# Patient Record
Sex: Female | Born: 1955 | Race: Black or African American | Hispanic: No | Marital: Married | Smoking: Never smoker
Health system: Southern US, Community
[De-identification: ages and names within clinical notes are randomized; demographics above are authoritative.]

## PROBLEM LIST (undated history)

## (undated) DIAGNOSIS — G894 Chronic pain syndrome: Secondary | ICD-10-CM

## (undated) DIAGNOSIS — J302 Other seasonal allergic rhinitis: Secondary | ICD-10-CM

## (undated) DIAGNOSIS — F329 Major depressive disorder, single episode, unspecified: Secondary | ICD-10-CM

## (undated) DIAGNOSIS — E119 Type 2 diabetes mellitus without complications: Secondary | ICD-10-CM

## (undated) DIAGNOSIS — I4711 Inappropriate sinus tachycardia, so stated: Secondary | ICD-10-CM

## (undated) DIAGNOSIS — D649 Anemia, unspecified: Secondary | ICD-10-CM

## (undated) DIAGNOSIS — R000 Tachycardia, unspecified: Secondary | ICD-10-CM

## (undated) DIAGNOSIS — M19011 Primary osteoarthritis, right shoulder: Secondary | ICD-10-CM

## (undated) DIAGNOSIS — M170 Bilateral primary osteoarthritis of knee: Secondary | ICD-10-CM

## (undated) DIAGNOSIS — K2970 Gastritis, unspecified, without bleeding: Secondary | ICD-10-CM

## (undated) DIAGNOSIS — K219 Gastro-esophageal reflux disease without esophagitis: Secondary | ICD-10-CM

## (undated) DIAGNOSIS — F419 Anxiety disorder, unspecified: Secondary | ICD-10-CM

## (undated) DIAGNOSIS — M19012 Primary osteoarthritis, left shoulder: Secondary | ICD-10-CM

## (undated) DIAGNOSIS — G8929 Other chronic pain: Secondary | ICD-10-CM

## (undated) DIAGNOSIS — M797 Fibromyalgia: Secondary | ICD-10-CM

## (undated) DIAGNOSIS — F32A Depression, unspecified: Secondary | ICD-10-CM

## (undated) DIAGNOSIS — Z531 Procedure and treatment not carried out because of patient's decision for reasons of belief and group pressure: Secondary | ICD-10-CM

## (undated) DIAGNOSIS — G43909 Migraine, unspecified, not intractable, without status migrainosus: Secondary | ICD-10-CM

## (undated) DIAGNOSIS — M549 Dorsalgia, unspecified: Secondary | ICD-10-CM

## (undated) DIAGNOSIS — IMO0001 Reserved for inherently not codable concepts without codable children: Secondary | ICD-10-CM

## (undated) DIAGNOSIS — S92919A Unspecified fracture of unspecified toe(s), initial encounter for closed fracture: Secondary | ICD-10-CM

## (undated) DIAGNOSIS — I10 Essential (primary) hypertension: Secondary | ICD-10-CM

## (undated) DIAGNOSIS — M8580 Other specified disorders of bone density and structure, unspecified site: Secondary | ICD-10-CM

## (undated) HISTORY — DX: Primary osteoarthritis, right shoulder: M19.011

## (undated) HISTORY — DX: Inappropriate sinus tachycardia, so stated: I47.11

## (undated) HISTORY — DX: Chronic pain syndrome: G89.4

## (undated) HISTORY — DX: Major depressive disorder, single episode, unspecified: F32.9

## (undated) HISTORY — DX: Essential (primary) hypertension: I10

## (undated) HISTORY — DX: Other specified disorders of bone density and structure, unspecified site: M85.80

## (undated) HISTORY — DX: Depression, unspecified: F32.A

## (undated) HISTORY — DX: Gastro-esophageal reflux disease without esophagitis: K21.9

## (undated) HISTORY — DX: Fibromyalgia: M79.7

## (undated) HISTORY — PX: TUBAL LIGATION: SHX77

## (undated) HISTORY — DX: Other seasonal allergic rhinitis: J30.2

## (undated) HISTORY — DX: Anemia, unspecified: D64.9

## (undated) HISTORY — DX: Unspecified fracture of unspecified toe(s), initial encounter for closed fracture: S92.919A

## (undated) HISTORY — DX: Primary osteoarthritis, right shoulder: M19.012

## (undated) HISTORY — PX: CYSTOSCOPY: SUR368

## (undated) HISTORY — PX: CHOLECYSTECTOMY: SHX55

## (undated) HISTORY — DX: Tachycardia, unspecified: R00.0

## (undated) HISTORY — DX: Anxiety disorder, unspecified: F41.9

## (undated) HISTORY — DX: Gastritis, unspecified, without bleeding: K29.70

## (undated) HISTORY — DX: Bilateral primary osteoarthritis of knee: M17.0

---

## 1997-10-08 ENCOUNTER — Ambulatory Visit (HOSPITAL_COMMUNITY): Admission: RE | Admit: 1997-10-08 | Discharge: 1997-10-08 | Payer: Self-pay | Admitting: *Deleted

## 1997-10-30 ENCOUNTER — Emergency Department (HOSPITAL_COMMUNITY): Admission: EM | Admit: 1997-10-30 | Discharge: 1997-10-30 | Payer: Self-pay | Admitting: Emergency Medicine

## 1998-02-12 ENCOUNTER — Ambulatory Visit (HOSPITAL_COMMUNITY): Admission: RE | Admit: 1998-02-12 | Discharge: 1998-02-12 | Payer: Self-pay | Admitting: *Deleted

## 1998-08-16 ENCOUNTER — Other Ambulatory Visit: Admission: RE | Admit: 1998-08-16 | Discharge: 1998-08-16 | Payer: Self-pay | Admitting: Obstetrics and Gynecology

## 1999-05-05 ENCOUNTER — Ambulatory Visit (HOSPITAL_COMMUNITY): Admission: RE | Admit: 1999-05-05 | Discharge: 1999-05-05 | Payer: Self-pay | Admitting: Gastroenterology

## 1999-05-18 ENCOUNTER — Ambulatory Visit (HOSPITAL_COMMUNITY): Admission: RE | Admit: 1999-05-18 | Discharge: 1999-05-18 | Payer: Self-pay | Admitting: Obstetrics and Gynecology

## 1999-05-18 ENCOUNTER — Encounter (INDEPENDENT_AMBULATORY_CARE_PROVIDER_SITE_OTHER): Payer: Self-pay | Admitting: Specialist

## 1999-09-25 ENCOUNTER — Emergency Department (HOSPITAL_COMMUNITY): Admission: EM | Admit: 1999-09-25 | Discharge: 1999-09-25 | Payer: Self-pay | Admitting: Emergency Medicine

## 1999-12-22 ENCOUNTER — Emergency Department (HOSPITAL_COMMUNITY): Admission: EM | Admit: 1999-12-22 | Discharge: 1999-12-22 | Payer: Self-pay | Admitting: Emergency Medicine

## 2000-02-23 ENCOUNTER — Emergency Department (HOSPITAL_COMMUNITY): Admission: EM | Admit: 2000-02-23 | Discharge: 2000-02-23 | Payer: Self-pay | Admitting: Emergency Medicine

## 2000-04-07 ENCOUNTER — Emergency Department (HOSPITAL_COMMUNITY): Admission: EM | Admit: 2000-04-07 | Discharge: 2000-04-07 | Payer: Self-pay | Admitting: Emergency Medicine

## 2000-04-21 ENCOUNTER — Emergency Department (HOSPITAL_COMMUNITY): Admission: EM | Admit: 2000-04-21 | Discharge: 2000-04-21 | Payer: Self-pay | Admitting: Emergency Medicine

## 2000-04-26 ENCOUNTER — Emergency Department (HOSPITAL_COMMUNITY): Admission: EM | Admit: 2000-04-26 | Discharge: 2000-04-26 | Payer: Self-pay | Admitting: Emergency Medicine

## 2000-09-10 ENCOUNTER — Emergency Department (HOSPITAL_COMMUNITY): Admission: EM | Admit: 2000-09-10 | Discharge: 2000-09-10 | Payer: Self-pay | Admitting: Emergency Medicine

## 2000-09-10 ENCOUNTER — Encounter: Payer: Self-pay | Admitting: Emergency Medicine

## 2000-09-13 ENCOUNTER — Encounter: Admission: RE | Admit: 2000-09-13 | Discharge: 2000-09-13 | Payer: Self-pay | Admitting: Internal Medicine

## 2000-09-25 ENCOUNTER — Encounter: Admission: RE | Admit: 2000-09-25 | Discharge: 2000-09-25 | Payer: Self-pay | Admitting: Internal Medicine

## 2000-10-10 ENCOUNTER — Other Ambulatory Visit: Admission: RE | Admit: 2000-10-10 | Discharge: 2000-10-10 | Payer: Self-pay | Admitting: Obstetrics and Gynecology

## 2000-11-06 ENCOUNTER — Encounter: Admission: RE | Admit: 2000-11-06 | Discharge: 2000-11-06 | Payer: Self-pay | Admitting: Internal Medicine

## 2000-11-29 ENCOUNTER — Encounter: Admission: RE | Admit: 2000-11-29 | Discharge: 2000-11-29 | Payer: Self-pay | Admitting: Internal Medicine

## 2000-12-05 ENCOUNTER — Encounter: Payer: Self-pay | Admitting: Internal Medicine

## 2000-12-05 ENCOUNTER — Encounter: Admission: RE | Admit: 2000-12-05 | Discharge: 2000-12-05 | Payer: Self-pay | Admitting: Internal Medicine

## 2000-12-05 ENCOUNTER — Ambulatory Visit (HOSPITAL_COMMUNITY): Admission: RE | Admit: 2000-12-05 | Discharge: 2000-12-05 | Payer: Self-pay | Admitting: Internal Medicine

## 2000-12-07 ENCOUNTER — Encounter: Admission: RE | Admit: 2000-12-07 | Discharge: 2000-12-07 | Payer: Self-pay | Admitting: Internal Medicine

## 2000-12-12 ENCOUNTER — Encounter: Payer: Self-pay | Admitting: Emergency Medicine

## 2000-12-12 ENCOUNTER — Emergency Department (HOSPITAL_COMMUNITY): Admission: EM | Admit: 2000-12-12 | Discharge: 2000-12-12 | Payer: Self-pay | Admitting: Emergency Medicine

## 2000-12-14 ENCOUNTER — Encounter: Admission: RE | Admit: 2000-12-14 | Discharge: 2000-12-14 | Payer: Self-pay | Admitting: Internal Medicine

## 2001-02-16 ENCOUNTER — Encounter: Admission: RE | Admit: 2001-02-16 | Discharge: 2001-02-16 | Payer: Self-pay | Admitting: Internal Medicine

## 2001-03-21 ENCOUNTER — Encounter: Admission: RE | Admit: 2001-03-21 | Discharge: 2001-03-21 | Payer: Self-pay | Admitting: Internal Medicine

## 2001-03-24 ENCOUNTER — Emergency Department (HOSPITAL_COMMUNITY): Admission: EM | Admit: 2001-03-24 | Discharge: 2001-03-24 | Payer: Self-pay | Admitting: Emergency Medicine

## 2001-05-09 HISTORY — PX: VAGINAL HYSTERECTOMY: SUR661

## 2001-06-04 ENCOUNTER — Other Ambulatory Visit: Admission: RE | Admit: 2001-06-04 | Discharge: 2001-06-04 | Payer: Self-pay | Admitting: Obstetrics and Gynecology

## 2001-07-06 ENCOUNTER — Ambulatory Visit (HOSPITAL_COMMUNITY)
Admission: RE | Admit: 2001-07-06 | Discharge: 2001-07-06 | Payer: Self-pay | Admitting: Physical Medicine & Rehabilitation

## 2001-07-06 ENCOUNTER — Encounter: Payer: Self-pay | Admitting: Physical Medicine & Rehabilitation

## 2001-09-11 ENCOUNTER — Encounter (INDEPENDENT_AMBULATORY_CARE_PROVIDER_SITE_OTHER): Payer: Self-pay | Admitting: Specialist

## 2001-09-11 ENCOUNTER — Observation Stay (HOSPITAL_COMMUNITY): Admission: RE | Admit: 2001-09-11 | Discharge: 2001-09-12 | Payer: Self-pay | Admitting: Obstetrics and Gynecology

## 2002-02-18 ENCOUNTER — Encounter: Payer: Self-pay | Admitting: Urology

## 2002-02-18 ENCOUNTER — Encounter: Admission: RE | Admit: 2002-02-18 | Discharge: 2002-02-18 | Payer: Self-pay | Admitting: Urology

## 2002-02-22 ENCOUNTER — Ambulatory Visit (HOSPITAL_BASED_OUTPATIENT_CLINIC_OR_DEPARTMENT_OTHER): Admission: RE | Admit: 2002-02-22 | Discharge: 2002-02-22 | Payer: Self-pay | Admitting: Urology

## 2002-03-12 ENCOUNTER — Other Ambulatory Visit (HOSPITAL_COMMUNITY): Admission: RE | Admit: 2002-03-12 | Discharge: 2002-03-28 | Payer: Self-pay | Admitting: Urology

## 2002-03-20 ENCOUNTER — Encounter: Admission: RE | Admit: 2002-03-20 | Discharge: 2002-03-20 | Payer: Self-pay | Admitting: Internal Medicine

## 2002-05-21 ENCOUNTER — Ambulatory Visit (HOSPITAL_COMMUNITY)
Admission: RE | Admit: 2002-05-21 | Discharge: 2002-05-21 | Payer: Self-pay | Admitting: Physical Medicine & Rehabilitation

## 2002-05-29 ENCOUNTER — Encounter: Payer: Self-pay | Admitting: Physical Medicine & Rehabilitation

## 2002-05-29 ENCOUNTER — Ambulatory Visit (HOSPITAL_COMMUNITY)
Admission: RE | Admit: 2002-05-29 | Discharge: 2002-05-29 | Payer: Self-pay | Admitting: Physical Medicine & Rehabilitation

## 2002-06-10 ENCOUNTER — Encounter: Admission: RE | Admit: 2002-06-10 | Discharge: 2002-06-10 | Payer: Self-pay | Admitting: Internal Medicine

## 2002-06-17 ENCOUNTER — Encounter: Admission: RE | Admit: 2002-06-17 | Discharge: 2002-09-15 | Payer: Self-pay | Admitting: Internal Medicine

## 2002-07-12 ENCOUNTER — Encounter: Payer: Self-pay | Admitting: Obstetrics and Gynecology

## 2002-07-12 ENCOUNTER — Ambulatory Visit (HOSPITAL_COMMUNITY): Admission: RE | Admit: 2002-07-12 | Discharge: 2002-07-12 | Payer: Self-pay | Admitting: Obstetrics and Gynecology

## 2002-07-18 ENCOUNTER — Ambulatory Visit (HOSPITAL_BASED_OUTPATIENT_CLINIC_OR_DEPARTMENT_OTHER): Admission: RE | Admit: 2002-07-18 | Discharge: 2002-07-18 | Payer: Self-pay | Admitting: Urology

## 2002-08-27 ENCOUNTER — Encounter
Admission: RE | Admit: 2002-08-27 | Discharge: 2002-11-25 | Payer: Self-pay | Admitting: Physical Medicine & Rehabilitation

## 2003-01-23 ENCOUNTER — Encounter
Admission: RE | Admit: 2003-01-23 | Discharge: 2003-04-23 | Payer: Self-pay | Admitting: Physical Medicine & Rehabilitation

## 2003-04-29 ENCOUNTER — Encounter
Admission: RE | Admit: 2003-04-29 | Discharge: 2003-07-28 | Payer: Self-pay | Admitting: Physical Medicine & Rehabilitation

## 2003-05-08 ENCOUNTER — Encounter: Admission: RE | Admit: 2003-05-08 | Discharge: 2003-05-08 | Payer: Self-pay | Admitting: Internal Medicine

## 2003-05-08 ENCOUNTER — Ambulatory Visit (HOSPITAL_COMMUNITY): Admission: RE | Admit: 2003-05-08 | Discharge: 2003-05-08 | Payer: Self-pay | Admitting: Internal Medicine

## 2003-05-29 ENCOUNTER — Encounter: Admission: RE | Admit: 2003-05-29 | Discharge: 2003-05-29 | Payer: Self-pay | Admitting: Internal Medicine

## 2003-08-14 ENCOUNTER — Encounter
Admission: RE | Admit: 2003-08-14 | Discharge: 2003-11-12 | Payer: Self-pay | Admitting: Physical Medicine & Rehabilitation

## 2003-10-02 ENCOUNTER — Emergency Department (HOSPITAL_COMMUNITY): Admission: EM | Admit: 2003-10-02 | Discharge: 2003-10-02 | Payer: Self-pay | Admitting: *Deleted

## 2003-10-16 ENCOUNTER — Encounter: Admission: RE | Admit: 2003-10-16 | Discharge: 2003-10-16 | Payer: Self-pay | Admitting: Internal Medicine

## 2004-02-04 ENCOUNTER — Encounter
Admission: RE | Admit: 2004-02-04 | Discharge: 2004-05-04 | Payer: Self-pay | Admitting: Physical Medicine & Rehabilitation

## 2004-02-06 ENCOUNTER — Ambulatory Visit: Payer: Self-pay | Admitting: Physical Medicine & Rehabilitation

## 2004-02-12 ENCOUNTER — Ambulatory Visit: Payer: Self-pay | Admitting: Internal Medicine

## 2004-03-04 ENCOUNTER — Ambulatory Visit: Payer: Self-pay | Admitting: Internal Medicine

## 2004-03-04 ENCOUNTER — Ambulatory Visit (HOSPITAL_COMMUNITY): Admission: RE | Admit: 2004-03-04 | Discharge: 2004-03-04 | Payer: Self-pay | Admitting: Internal Medicine

## 2004-03-10 ENCOUNTER — Ambulatory Visit (HOSPITAL_BASED_OUTPATIENT_CLINIC_OR_DEPARTMENT_OTHER)
Admission: RE | Admit: 2004-03-10 | Discharge: 2004-03-10 | Payer: Self-pay | Admitting: Physical Medicine & Rehabilitation

## 2004-03-12 ENCOUNTER — Emergency Department (HOSPITAL_COMMUNITY): Admission: EM | Admit: 2004-03-12 | Discharge: 2004-03-13 | Payer: Self-pay | Admitting: Emergency Medicine

## 2004-03-15 ENCOUNTER — Ambulatory Visit: Payer: Self-pay | Admitting: Internal Medicine

## 2004-04-23 ENCOUNTER — Emergency Department (HOSPITAL_COMMUNITY): Admission: EM | Admit: 2004-04-23 | Discharge: 2004-04-23 | Payer: Self-pay | Admitting: Emergency Medicine

## 2004-04-27 ENCOUNTER — Ambulatory Visit: Payer: Self-pay | Admitting: Internal Medicine

## 2004-05-13 ENCOUNTER — Encounter
Admission: RE | Admit: 2004-05-13 | Discharge: 2004-08-11 | Payer: Self-pay | Admitting: Physical Medicine & Rehabilitation

## 2004-05-17 ENCOUNTER — Ambulatory Visit: Payer: Self-pay | Admitting: Physical Medicine & Rehabilitation

## 2004-07-07 ENCOUNTER — Emergency Department (HOSPITAL_COMMUNITY): Admission: EM | Admit: 2004-07-07 | Discharge: 2004-07-07 | Payer: Self-pay | Admitting: Family Medicine

## 2004-07-08 ENCOUNTER — Emergency Department (HOSPITAL_COMMUNITY): Admission: EM | Admit: 2004-07-08 | Discharge: 2004-07-08 | Payer: Self-pay | Admitting: Family Medicine

## 2004-09-08 ENCOUNTER — Encounter
Admission: RE | Admit: 2004-09-08 | Discharge: 2004-12-07 | Payer: Self-pay | Admitting: Physical Medicine & Rehabilitation

## 2004-09-13 ENCOUNTER — Ambulatory Visit (HOSPITAL_COMMUNITY)
Admission: RE | Admit: 2004-09-13 | Discharge: 2004-09-13 | Payer: Self-pay | Admitting: Physical Medicine & Rehabilitation

## 2004-10-12 ENCOUNTER — Ambulatory Visit: Payer: Self-pay | Admitting: Physical Medicine & Rehabilitation

## 2004-12-06 ENCOUNTER — Ambulatory Visit: Payer: Self-pay | Admitting: Physical Medicine & Rehabilitation

## 2004-12-07 ENCOUNTER — Encounter
Admission: RE | Admit: 2004-12-07 | Discharge: 2005-03-07 | Payer: Self-pay | Admitting: Physical Medicine & Rehabilitation

## 2005-04-18 ENCOUNTER — Encounter
Admission: RE | Admit: 2005-04-18 | Discharge: 2005-07-17 | Payer: Self-pay | Admitting: Physical Medicine & Rehabilitation

## 2005-04-18 ENCOUNTER — Ambulatory Visit: Payer: Self-pay | Admitting: Physical Medicine & Rehabilitation

## 2005-05-13 ENCOUNTER — Ambulatory Visit: Payer: Self-pay | Admitting: Hospitalist

## 2005-05-17 ENCOUNTER — Ambulatory Visit (HOSPITAL_COMMUNITY): Admission: RE | Admit: 2005-05-17 | Discharge: 2005-05-17 | Payer: Self-pay | Admitting: Internal Medicine

## 2005-06-10 ENCOUNTER — Ambulatory Visit: Payer: Self-pay | Admitting: Internal Medicine

## 2005-06-13 ENCOUNTER — Ambulatory Visit (HOSPITAL_COMMUNITY): Admission: RE | Admit: 2005-06-13 | Discharge: 2005-06-13 | Payer: Self-pay | Admitting: *Deleted

## 2005-06-14 ENCOUNTER — Ambulatory Visit: Payer: Self-pay | Admitting: Internal Medicine

## 2005-06-20 ENCOUNTER — Encounter (INDEPENDENT_AMBULATORY_CARE_PROVIDER_SITE_OTHER): Payer: Self-pay | Admitting: *Deleted

## 2005-06-20 ENCOUNTER — Encounter (INDEPENDENT_AMBULATORY_CARE_PROVIDER_SITE_OTHER): Payer: Self-pay | Admitting: Interventional Radiology

## 2005-06-20 ENCOUNTER — Ambulatory Visit (HOSPITAL_COMMUNITY): Admission: RE | Admit: 2005-06-20 | Discharge: 2005-06-20 | Payer: Self-pay | Admitting: *Deleted

## 2005-06-24 ENCOUNTER — Ambulatory Visit: Payer: Self-pay | Admitting: Internal Medicine

## 2005-06-30 ENCOUNTER — Ambulatory Visit (HOSPITAL_COMMUNITY): Admission: RE | Admit: 2005-06-30 | Discharge: 2005-06-30 | Payer: Self-pay | Admitting: Internal Medicine

## 2005-06-30 ENCOUNTER — Ambulatory Visit: Payer: Self-pay | Admitting: Internal Medicine

## 2005-07-01 ENCOUNTER — Ambulatory Visit: Payer: Self-pay | Admitting: Internal Medicine

## 2005-07-05 ENCOUNTER — Encounter: Admission: RE | Admit: 2005-07-05 | Discharge: 2005-07-05 | Payer: Self-pay | Admitting: Internal Medicine

## 2005-07-12 ENCOUNTER — Ambulatory Visit: Payer: Self-pay | Admitting: Sports Medicine

## 2005-07-15 ENCOUNTER — Encounter
Admission: RE | Admit: 2005-07-15 | Discharge: 2005-10-13 | Payer: Self-pay | Admitting: Physical Medicine & Rehabilitation

## 2005-07-18 ENCOUNTER — Ambulatory Visit: Payer: Self-pay | Admitting: Physical Medicine & Rehabilitation

## 2005-07-28 ENCOUNTER — Encounter
Admission: RE | Admit: 2005-07-28 | Discharge: 2005-10-26 | Payer: Self-pay | Admitting: Physical Medicine & Rehabilitation

## 2005-08-09 ENCOUNTER — Ambulatory Visit: Payer: Self-pay | Admitting: Sports Medicine

## 2005-08-15 ENCOUNTER — Emergency Department (HOSPITAL_COMMUNITY): Admission: EM | Admit: 2005-08-15 | Discharge: 2005-08-15 | Payer: Self-pay | Admitting: Family Medicine

## 2005-09-28 ENCOUNTER — Ambulatory Visit: Payer: Self-pay | Admitting: Physical Medicine & Rehabilitation

## 2005-10-20 ENCOUNTER — Ambulatory Visit: Payer: Self-pay | Admitting: Psychology

## 2005-10-25 ENCOUNTER — Encounter
Admission: RE | Admit: 2005-10-25 | Discharge: 2006-01-23 | Payer: Self-pay | Admitting: Physical Medicine & Rehabilitation

## 2005-11-17 ENCOUNTER — Ambulatory Visit: Payer: Self-pay | Admitting: Physical Medicine & Rehabilitation

## 2005-11-19 ENCOUNTER — Emergency Department (HOSPITAL_COMMUNITY): Admission: EM | Admit: 2005-11-19 | Discharge: 2005-11-19 | Payer: Self-pay | Admitting: Emergency Medicine

## 2005-11-20 ENCOUNTER — Emergency Department (HOSPITAL_COMMUNITY): Admission: EM | Admit: 2005-11-20 | Discharge: 2005-11-20 | Payer: Self-pay | Admitting: Emergency Medicine

## 2005-11-24 ENCOUNTER — Ambulatory Visit: Payer: Self-pay | Admitting: Internal Medicine

## 2005-12-05 ENCOUNTER — Encounter: Admission: RE | Admit: 2005-12-05 | Discharge: 2005-12-05 | Payer: Self-pay | Admitting: Psychology

## 2005-12-22 ENCOUNTER — Ambulatory Visit: Payer: Self-pay | Admitting: Physical Medicine & Rehabilitation

## 2006-01-24 ENCOUNTER — Encounter: Admission: RE | Admit: 2006-01-24 | Discharge: 2006-04-24 | Payer: Self-pay | Admitting: Psychology

## 2006-02-09 ENCOUNTER — Encounter
Admission: RE | Admit: 2006-02-09 | Discharge: 2006-05-10 | Payer: Self-pay | Admitting: Physical Medicine & Rehabilitation

## 2006-02-09 ENCOUNTER — Ambulatory Visit: Payer: Self-pay | Admitting: Physical Medicine & Rehabilitation

## 2006-03-01 ENCOUNTER — Encounter: Payer: Self-pay | Admitting: Internal Medicine

## 2006-03-01 DIAGNOSIS — F410 Panic disorder [episodic paroxysmal anxiety] without agoraphobia: Secondary | ICD-10-CM | POA: Insufficient documentation

## 2006-03-01 DIAGNOSIS — E669 Obesity, unspecified: Secondary | ICD-10-CM | POA: Insufficient documentation

## 2006-03-01 DIAGNOSIS — I10 Essential (primary) hypertension: Secondary | ICD-10-CM | POA: Insufficient documentation

## 2006-03-22 ENCOUNTER — Ambulatory Visit: Payer: Self-pay | Admitting: Hospitalist

## 2006-03-22 ENCOUNTER — Ambulatory Visit (HOSPITAL_COMMUNITY): Admission: RE | Admit: 2006-03-22 | Discharge: 2006-03-22 | Payer: Self-pay | Admitting: Hospitalist

## 2006-03-29 ENCOUNTER — Ambulatory Visit: Payer: Self-pay | Admitting: Hospitalist

## 2006-03-29 ENCOUNTER — Encounter: Payer: Self-pay | Admitting: Internal Medicine

## 2006-04-06 ENCOUNTER — Ambulatory Visit: Payer: Self-pay | Admitting: Physical Medicine & Rehabilitation

## 2006-05-22 ENCOUNTER — Encounter
Admission: RE | Admit: 2006-05-22 | Discharge: 2006-08-20 | Payer: Self-pay | Admitting: Physical Medicine & Rehabilitation

## 2006-05-29 ENCOUNTER — Emergency Department (HOSPITAL_COMMUNITY): Admission: EM | Admit: 2006-05-29 | Discharge: 2006-05-29 | Payer: Self-pay | Admitting: Emergency Medicine

## 2006-05-30 ENCOUNTER — Ambulatory Visit: Payer: Self-pay | Admitting: Physical Medicine & Rehabilitation

## 2006-07-21 ENCOUNTER — Emergency Department (HOSPITAL_COMMUNITY): Admission: EM | Admit: 2006-07-21 | Discharge: 2006-07-22 | Payer: Self-pay | Admitting: Emergency Medicine

## 2006-07-25 ENCOUNTER — Ambulatory Visit: Payer: Self-pay | Admitting: Physical Medicine & Rehabilitation

## 2006-08-24 ENCOUNTER — Encounter: Admission: RE | Admit: 2006-08-24 | Discharge: 2006-08-24 | Payer: Self-pay | Admitting: Psychology

## 2006-09-20 ENCOUNTER — Ambulatory Visit: Payer: Self-pay | Admitting: Physical Medicine & Rehabilitation

## 2006-09-20 ENCOUNTER — Encounter
Admission: RE | Admit: 2006-09-20 | Discharge: 2006-12-19 | Payer: Self-pay | Admitting: Physical Medicine & Rehabilitation

## 2006-09-28 IMAGING — MG MM MAMMO LIMITED*L*
3 series · 3 of 3 positions shown · non-contrast
Comparison: none

DIGITAL LIMITED LEFT DIAGNOSTIC MAMMOGRAM:
CLINICAL DATA: The patient returns for evaluation of a possible area of distortion in the left 
breast noted on recent screening study of 05-17-05.

Spot compression views in the CC and MLO projection demonstrate no persistent mass or distortion.  
The initial appearance is felt to be due to overlapping glandular tissue.

[L CC (1 of 2)]
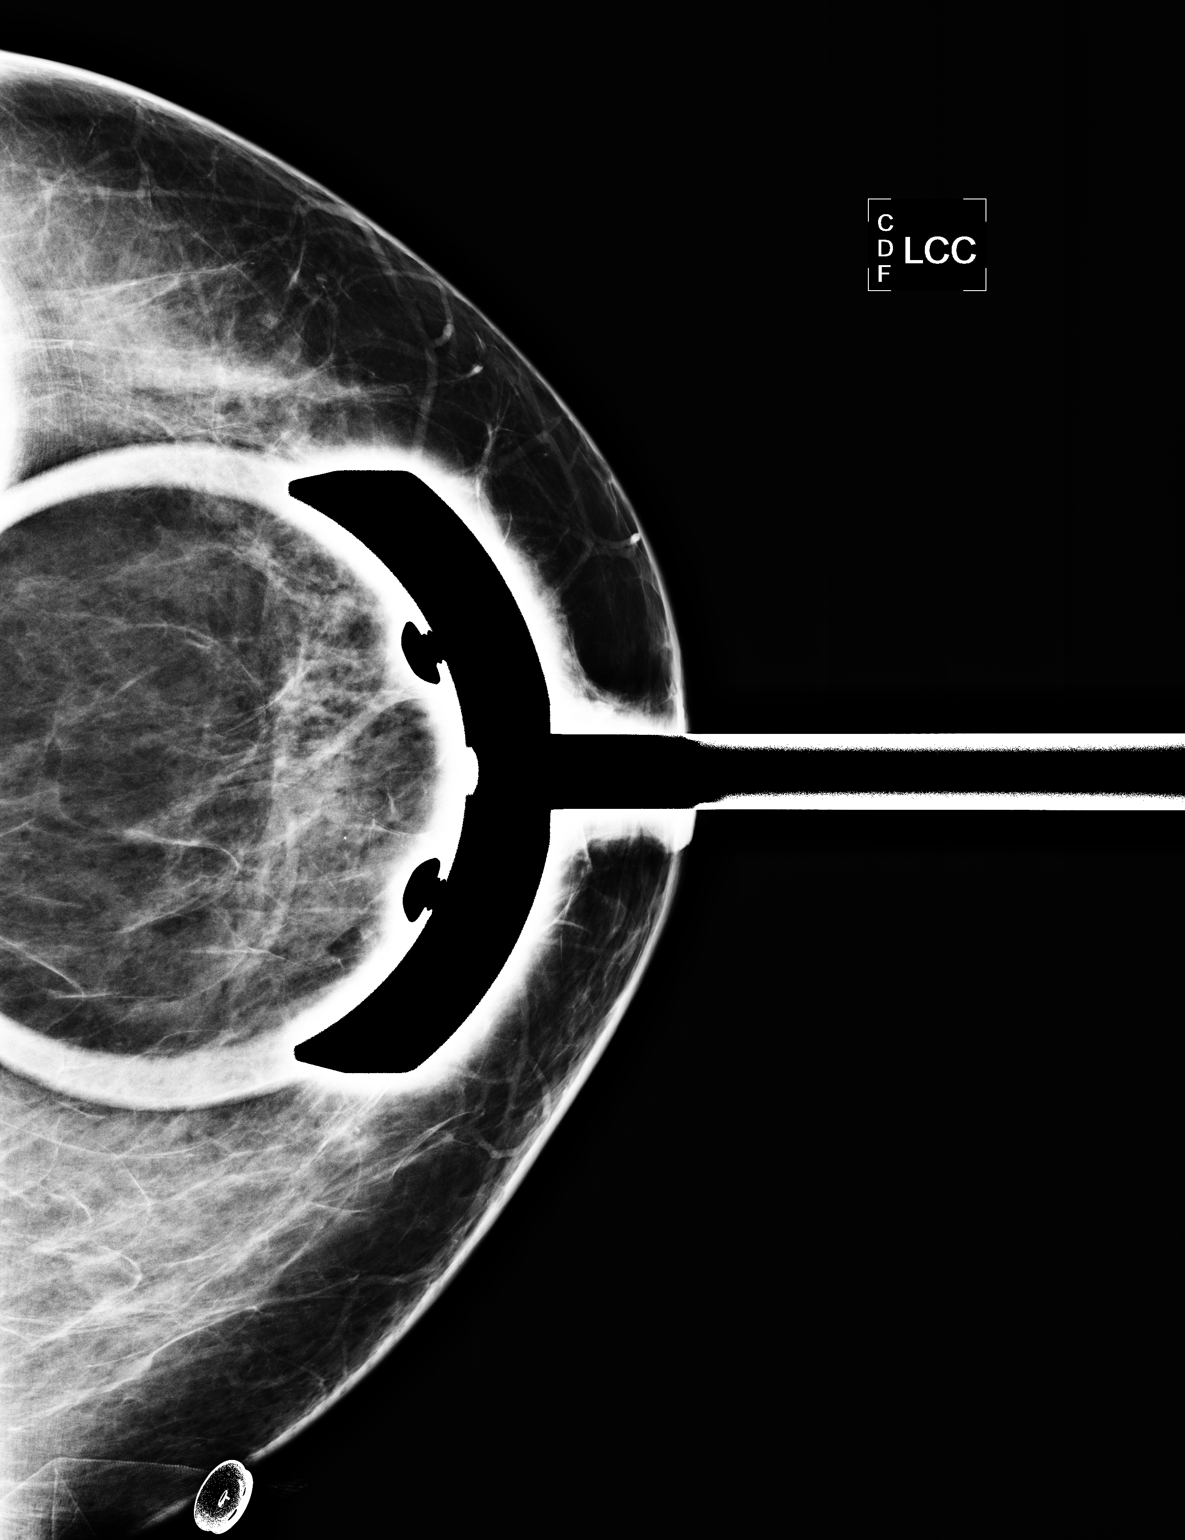

[L MLO]
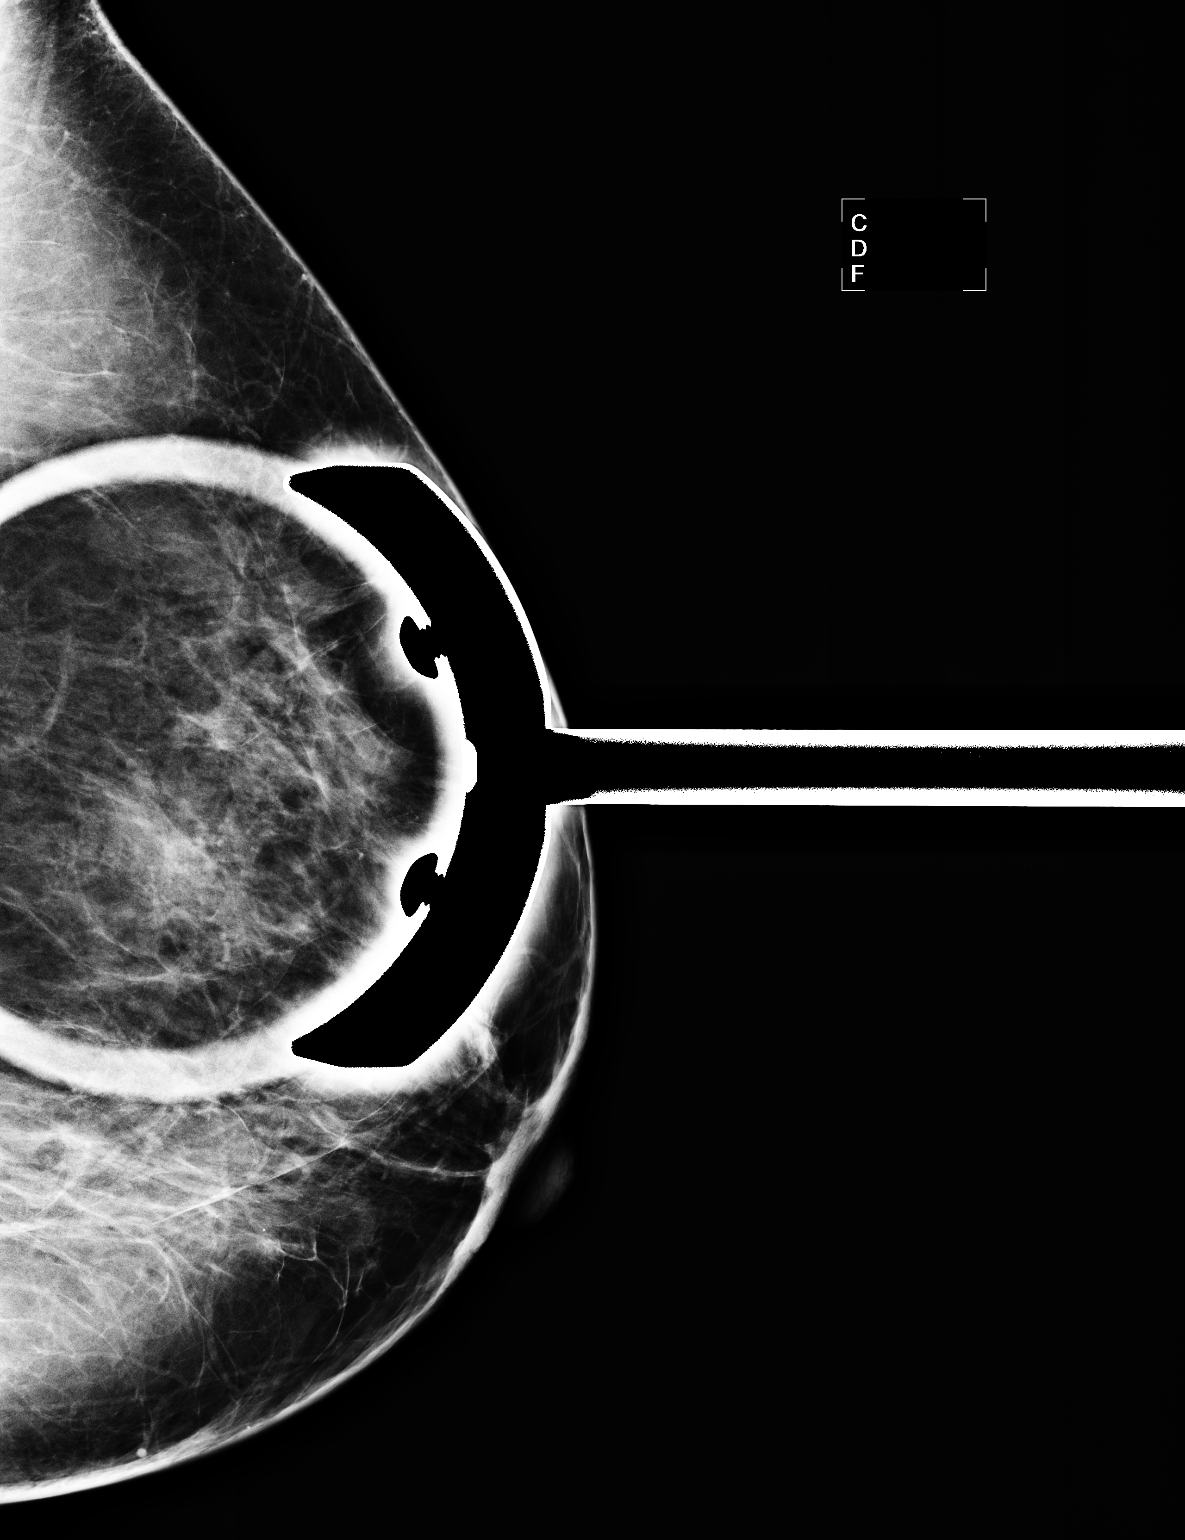

[L CC (2 of 2)]
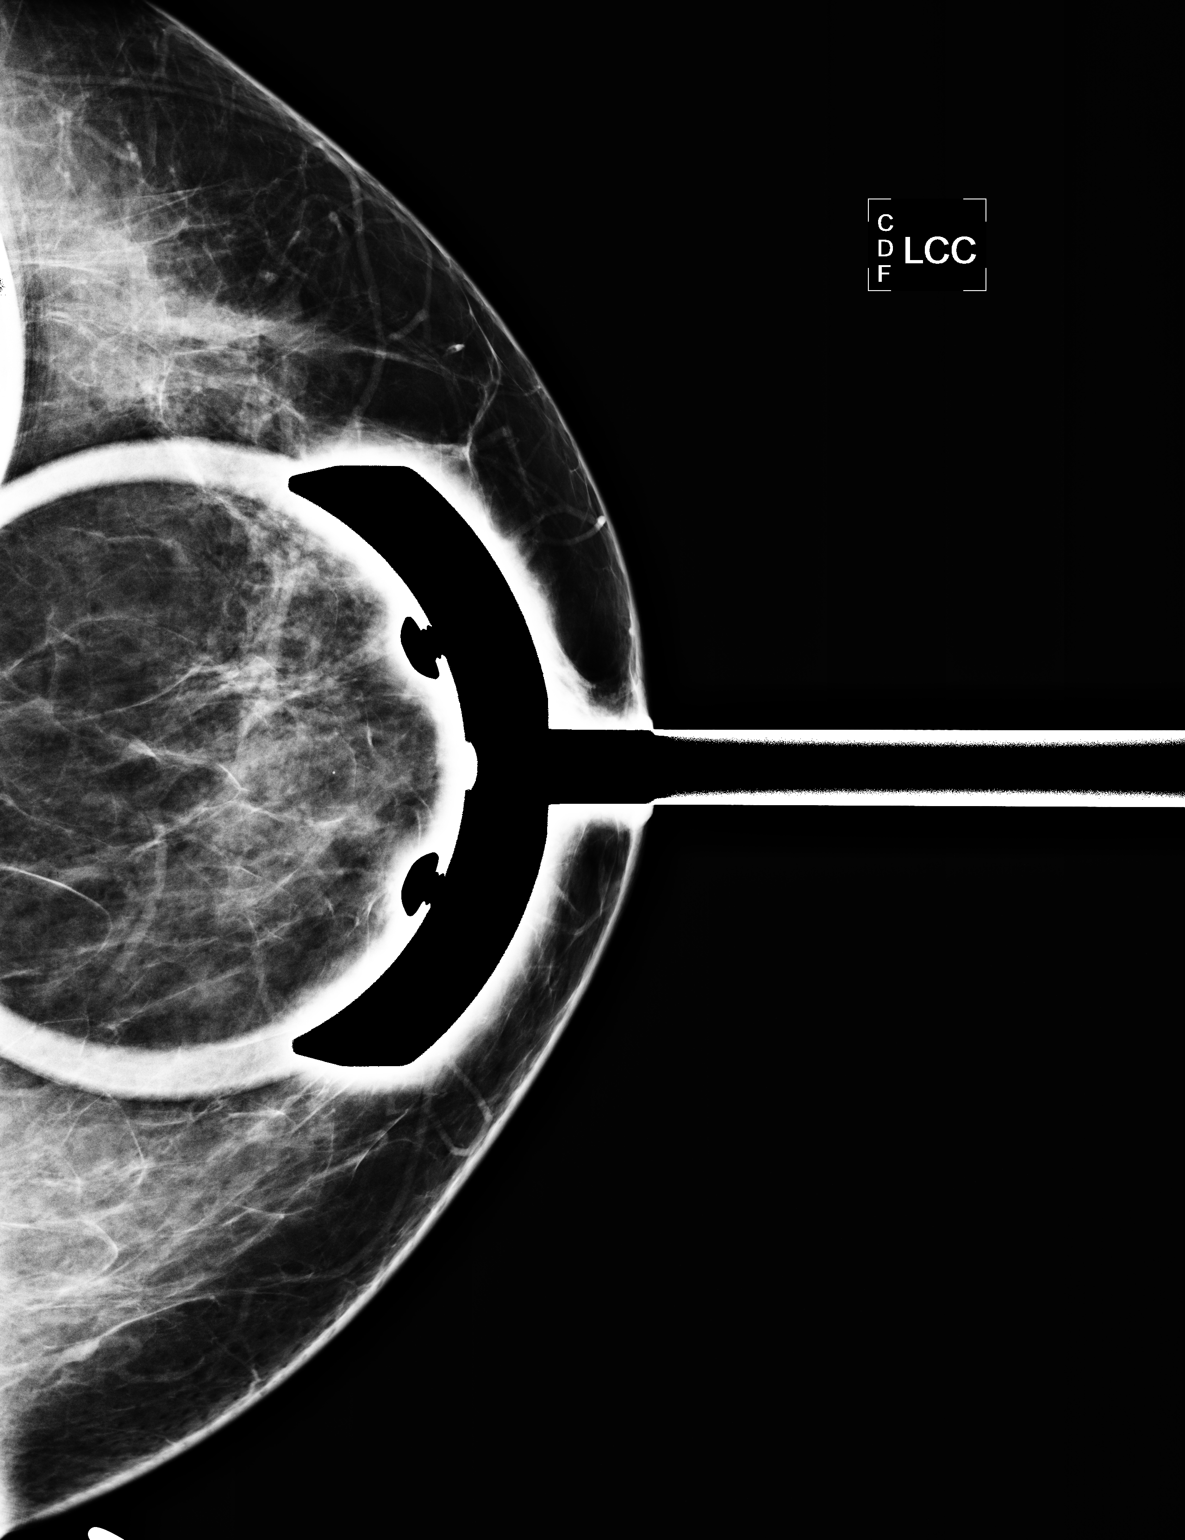

[3 of 3 positions shown; findings below may reference images not displayed]

IMPRESSION: No persistent worrisome abnormality upon additional imaging of the left breast.  Yearly screening 
mammography is suggested.

ASSESSMENT: Negative - BI-RADS 1

Screening mammogram of both breasts in 1 year.

## 2006-10-17 ENCOUNTER — Ambulatory Visit: Payer: Self-pay | Admitting: Internal Medicine

## 2006-10-17 ENCOUNTER — Encounter (INDEPENDENT_AMBULATORY_CARE_PROVIDER_SITE_OTHER): Payer: Self-pay | Admitting: *Deleted

## 2006-11-13 ENCOUNTER — Ambulatory Visit: Payer: Self-pay | Admitting: Physical Medicine & Rehabilitation

## 2006-12-08 DIAGNOSIS — R300 Dysuria: Secondary | ICD-10-CM

## 2006-12-15 ENCOUNTER — Encounter (INDEPENDENT_AMBULATORY_CARE_PROVIDER_SITE_OTHER): Payer: Self-pay | Admitting: *Deleted

## 2006-12-15 ENCOUNTER — Ambulatory Visit: Payer: Self-pay | Admitting: Infectious Disease

## 2006-12-15 DIAGNOSIS — R5383 Other fatigue: Secondary | ICD-10-CM

## 2006-12-15 DIAGNOSIS — R5381 Other malaise: Secondary | ICD-10-CM

## 2006-12-18 LAB — CONVERTED CEMR LAB
Basophils Relative: 0 % (ref 0–1)
Bilirubin Urine: NEGATIVE
Eosinophils Absolute: 0.1 10*3/uL (ref 0.0–0.7)
Hemoglobin: 11.7 g/dL — ABNORMAL LOW (ref 12.0–15.0)
MCHC: 31.5 g/dL (ref 30.0–36.0)
MCV: 89.4 fL (ref 78.0–100.0)
Monocytes Absolute: 0.7 10*3/uL (ref 0.2–0.7)
Monocytes Relative: 7 % (ref 3–11)
Neutrophils Relative %: 47 % (ref 43–77)
RBC: 4.15 M/uL (ref 3.87–5.11)
RDW: 15.5 % — ABNORMAL HIGH (ref 11.5–14.0)
Specific Gravity, Urine: 1.021 (ref 1.005–1.03)
Urine Glucose: NEGATIVE mg/dL

## 2006-12-29 ENCOUNTER — Ambulatory Visit: Payer: Self-pay | Admitting: Infectious Disease

## 2006-12-29 DIAGNOSIS — R519 Headache, unspecified: Secondary | ICD-10-CM | POA: Insufficient documentation

## 2006-12-29 DIAGNOSIS — M25519 Pain in unspecified shoulder: Secondary | ICD-10-CM | POA: Insufficient documentation

## 2006-12-29 DIAGNOSIS — R51 Headache: Secondary | ICD-10-CM

## 2007-01-05 ENCOUNTER — Ambulatory Visit: Payer: Self-pay | Admitting: Physical Medicine & Rehabilitation

## 2007-01-05 ENCOUNTER — Encounter
Admission: RE | Admit: 2007-01-05 | Discharge: 2007-04-05 | Payer: Self-pay | Admitting: Physical Medicine & Rehabilitation

## 2007-03-13 ENCOUNTER — Ambulatory Visit: Payer: Self-pay | Admitting: Physical Medicine & Rehabilitation

## 2007-05-11 ENCOUNTER — Encounter
Admission: RE | Admit: 2007-05-11 | Discharge: 2007-08-09 | Payer: Self-pay | Admitting: Physical Medicine & Rehabilitation

## 2007-05-11 ENCOUNTER — Ambulatory Visit: Payer: Self-pay | Admitting: Physical Medicine & Rehabilitation

## 2007-06-13 ENCOUNTER — Ambulatory Visit: Payer: Self-pay | Admitting: Physical Medicine & Rehabilitation

## 2007-07-23 ENCOUNTER — Ambulatory Visit: Payer: Self-pay | Admitting: Hospitalist

## 2007-07-26 ENCOUNTER — Encounter (INDEPENDENT_AMBULATORY_CARE_PROVIDER_SITE_OTHER): Payer: Self-pay | Admitting: Hospitalist

## 2007-07-26 ENCOUNTER — Ambulatory Visit: Payer: Self-pay | Admitting: Internal Medicine

## 2007-07-26 LAB — CONVERTED CEMR LAB
CO2: 25 meq/L (ref 19–32)
Calcium: 9.7 mg/dL (ref 8.4–10.5)
Chloride: 102 meq/L (ref 96–112)
Glucose, Bld: 144 mg/dL — ABNORMAL HIGH (ref 70–99)
HDL: 41 mg/dL (ref >39)
Potassium: 4.1 meq/L (ref 3.5–5.3)
Sodium: 136 meq/L (ref 135–145)
Total CHOL/HDL Ratio: 3.9
Total Protein: 7.9 g/dL (ref 6.0–8.3)
VLDL: 34 mg/dL (ref 0–40)

## 2007-08-31 ENCOUNTER — Encounter
Admission: RE | Admit: 2007-08-31 | Discharge: 2007-11-29 | Payer: Self-pay | Admitting: Physical Medicine & Rehabilitation

## 2007-09-20 ENCOUNTER — Ambulatory Visit: Payer: Self-pay | Admitting: *Deleted

## 2007-09-20 ENCOUNTER — Ambulatory Visit (HOSPITAL_COMMUNITY): Admission: RE | Admit: 2007-09-20 | Discharge: 2007-09-20 | Payer: Self-pay | Admitting: *Deleted

## 2007-09-20 ENCOUNTER — Inpatient Hospital Stay (HOSPITAL_COMMUNITY): Admission: AD | Admit: 2007-09-20 | Discharge: 2007-09-21 | Payer: Self-pay | Admitting: Internal Medicine

## 2007-09-20 ENCOUNTER — Ambulatory Visit: Payer: Self-pay | Admitting: Internal Medicine

## 2007-09-25 ENCOUNTER — Ambulatory Visit: Payer: Self-pay | Admitting: Hospitalist

## 2007-09-25 ENCOUNTER — Telehealth: Payer: Self-pay | Admitting: *Deleted

## 2007-10-05 ENCOUNTER — Ambulatory Visit: Payer: Self-pay | Admitting: Physical Medicine & Rehabilitation

## 2007-10-08 ENCOUNTER — Encounter: Payer: Self-pay | Admitting: Internal Medicine

## 2007-10-08 DIAGNOSIS — E119 Type 2 diabetes mellitus without complications: Secondary | ICD-10-CM

## 2007-10-08 HISTORY — DX: Type 2 diabetes mellitus without complications: E11.9

## 2007-10-08 HISTORY — PX: TEMPORAL ARTERY BIOPSY / LIGATION: SUR132

## 2007-10-18 ENCOUNTER — Encounter (INDEPENDENT_AMBULATORY_CARE_PROVIDER_SITE_OTHER): Payer: Self-pay | Admitting: General Surgery

## 2007-10-18 ENCOUNTER — Encounter (INDEPENDENT_AMBULATORY_CARE_PROVIDER_SITE_OTHER): Payer: Self-pay | Admitting: *Deleted

## 2007-10-18 ENCOUNTER — Ambulatory Visit: Payer: Self-pay | Admitting: Internal Medicine

## 2007-10-18 ENCOUNTER — Telehealth (INDEPENDENT_AMBULATORY_CARE_PROVIDER_SITE_OTHER): Payer: Self-pay | Admitting: *Deleted

## 2007-10-18 ENCOUNTER — Ambulatory Visit (HOSPITAL_COMMUNITY): Admission: RE | Admit: 2007-10-18 | Discharge: 2007-10-18 | Payer: Self-pay | Admitting: General Surgery

## 2007-10-18 DIAGNOSIS — E871 Hypo-osmolality and hyponatremia: Secondary | ICD-10-CM | POA: Insufficient documentation

## 2007-10-18 DIAGNOSIS — R740 Nonspecific elevation of levels of transaminase and lactic acid dehydrogenase [LDH]: Secondary | ICD-10-CM

## 2007-10-18 DIAGNOSIS — R7309 Other abnormal glucose: Secondary | ICD-10-CM | POA: Insufficient documentation

## 2007-10-19 ENCOUNTER — Telehealth: Payer: Self-pay | Admitting: *Deleted

## 2007-10-23 ENCOUNTER — Ambulatory Visit: Payer: Self-pay | Admitting: Cardiology

## 2007-10-23 ENCOUNTER — Encounter: Admission: RE | Admit: 2007-10-23 | Discharge: 2007-10-23 | Payer: Self-pay | Admitting: Internal Medicine

## 2007-10-23 ENCOUNTER — Inpatient Hospital Stay (HOSPITAL_COMMUNITY): Admission: AD | Admit: 2007-10-23 | Discharge: 2007-10-26 | Payer: Self-pay | Admitting: Hospitalist

## 2007-10-23 ENCOUNTER — Ambulatory Visit: Payer: Self-pay | Admitting: Internal Medicine

## 2007-10-23 DIAGNOSIS — M316 Other giant cell arteritis: Secondary | ICD-10-CM | POA: Insufficient documentation

## 2007-10-23 LAB — CONVERTED CEMR LAB
Bilirubin Urine: NEGATIVE
Blood Glucose, Fingerstick: 492
Glucose, Urine, Semiquant: >=1000
Protein, U semiquant: NEGATIVE
Specific Gravity, Urine: <1.005
WBC Urine, dipstick: NEGATIVE
pH: 5

## 2007-10-24 ENCOUNTER — Encounter (INDEPENDENT_AMBULATORY_CARE_PROVIDER_SITE_OTHER): Payer: Self-pay | Admitting: Hospitalist

## 2007-10-29 ENCOUNTER — Encounter: Payer: Self-pay | Admitting: Internal Medicine

## 2007-10-30 ENCOUNTER — Encounter: Payer: Self-pay | Admitting: *Deleted

## 2007-10-30 ENCOUNTER — Encounter (INDEPENDENT_AMBULATORY_CARE_PROVIDER_SITE_OTHER): Payer: Self-pay | Admitting: *Deleted

## 2007-10-30 ENCOUNTER — Ambulatory Visit: Payer: Self-pay | Admitting: Internal Medicine

## 2007-11-01 ENCOUNTER — Emergency Department (HOSPITAL_COMMUNITY): Admission: EM | Admit: 2007-11-01 | Discharge: 2007-11-01 | Payer: Self-pay | Admitting: Emergency Medicine

## 2007-11-01 LAB — CONVERTED CEMR LAB
BUN: 17 mg/dL (ref 6–23)
Chloride: 97 meq/L (ref 96–112)
Creatinine, Ser: 0.76 mg/dL (ref 0.40–1.20)
Glucose, Bld: 343 mg/dL — ABNORMAL HIGH (ref 70–99)
Potassium: 4.8 meq/L (ref 3.5–5.3)

## 2007-11-05 ENCOUNTER — Ambulatory Visit: Payer: Self-pay | Admitting: Internal Medicine

## 2007-11-05 ENCOUNTER — Encounter: Payer: Self-pay | Admitting: Internal Medicine

## 2007-11-05 LAB — CONVERTED CEMR LAB
Alkaline Phosphatase: 54 units/L (ref 39–117)
CO2: 32 meq/L (ref 19–32)
Creatinine, Ser: 0.71 mg/dL (ref 0.40–1.20)
Glucose, Bld: 223 mg/dL — ABNORMAL HIGH (ref 70–99)
Total Bilirubin: 0.6 mg/dL (ref 0.3–1.2)

## 2007-11-08 ENCOUNTER — Encounter: Payer: Self-pay | Admitting: Internal Medicine

## 2007-11-08 ENCOUNTER — Ambulatory Visit: Payer: Self-pay | Admitting: *Deleted

## 2007-11-08 LAB — CONVERTED CEMR LAB
Blood Glucose, Fingerstick: 500
Blood Glucose, Home Monitor: 4 mg/dL

## 2007-11-14 ENCOUNTER — Ambulatory Visit: Payer: Self-pay | Admitting: Infectious Diseases

## 2007-11-14 ENCOUNTER — Encounter: Payer: Self-pay | Admitting: Internal Medicine

## 2007-11-14 DIAGNOSIS — E119 Type 2 diabetes mellitus without complications: Secondary | ICD-10-CM

## 2007-11-14 LAB — CONVERTED CEMR LAB
Blood Glucose, Fingerstick: 220
Blood Glucose, Fingerstick: 220
Hgb A1c MFr Bld: 11.6 %
Hgb A1c MFr Bld: 11.6 %

## 2007-11-15 ENCOUNTER — Telehealth (INDEPENDENT_AMBULATORY_CARE_PROVIDER_SITE_OTHER): Payer: Self-pay | Admitting: *Deleted

## 2007-11-16 ENCOUNTER — Telehealth (INDEPENDENT_AMBULATORY_CARE_PROVIDER_SITE_OTHER): Payer: Self-pay | Admitting: *Deleted

## 2007-11-16 LAB — CONVERTED CEMR LAB
AST: 24 units/L (ref 0–37)
Alkaline Phosphatase: 60 units/L (ref 39–117)
Basophils Absolute: 0.1 10*3/uL (ref 0.0–0.1)
Basophils Relative: 0 % (ref 0–1)
Glucose, Bld: 76 mg/dL (ref 70–99)
MCHC: 30.9 g/dL (ref 30.0–36.0)
Monocytes Absolute: 1.2 10*3/uL — ABNORMAL HIGH (ref 0.1–1.0)
Neutro Abs: 13.7 10*3/uL — ABNORMAL HIGH (ref 1.7–7.7)
Neutrophils Relative %: 66 % (ref 43–77)
Platelets: 262 10*3/uL (ref 150–400)
RDW: 15.6 % — ABNORMAL HIGH (ref 11.5–15.5)
Sed Rate: 41 mm/hr — ABNORMAL HIGH (ref 0–22)
Sodium: 140 meq/L (ref 135–145)
Total Bilirubin: 0.3 mg/dL (ref 0.3–1.2)
Total Protein: 6.9 g/dL (ref 6.0–8.3)

## 2007-11-21 ENCOUNTER — Encounter: Payer: Self-pay | Admitting: Internal Medicine

## 2007-11-21 ENCOUNTER — Ambulatory Visit: Payer: Self-pay | Admitting: *Deleted

## 2007-11-21 LAB — CONVERTED CEMR LAB
Basophils Absolute: 0 10*3/uL (ref 0.0–0.1)
Basophils Relative: 0 % (ref 0–1)
Bilirubin Urine: NEGATIVE
Eosinophils Absolute: 0 10*3/uL (ref 0.0–0.7)
Eosinophils Relative: 0 % (ref 0–5)
HCT: 42.5 % (ref 36.0–46.0)
Hemoglobin, Urine: NEGATIVE
Hemoglobin: 12.9 g/dL (ref 12.0–15.0)
Hgb A1c MFr Bld: 11.3 %
Lipase: 97 units/L — ABNORMAL HIGH (ref 0–75)
Lymphocytes Relative: 40 % (ref 12–46)
Lymphs Abs: 6.6 10*3/uL — ABNORMAL HIGH (ref 0.7–4.0)
MCHC: 30.4 g/dL (ref 30.0–36.0)
MCV: 94 fL (ref 78.0–100.0)
Monocytes Absolute: 1 10*3/uL (ref 0.1–1.0)
Monocytes Relative: 6 % (ref 3–12)
Neutro Abs: 9 10*3/uL — ABNORMAL HIGH (ref 1.7–7.7)
Neutrophils Relative %: 54 % (ref 43–77)
Nitrite: NEGATIVE
Platelets: 313 10*3/uL (ref 150–400)
Protein, ur: NEGATIVE mg/dL
RBC / HPF: NONE SEEN (ref <3)
RBC: 4.52 M/uL (ref 3.87–5.11)
RDW: 16.4 % — ABNORMAL HIGH (ref 11.5–15.5)
Specific Gravity, Urine: 1.025 (ref 1.005–1.03)
Urine Glucose: NEGATIVE mg/dL
Urobilinogen, UA: 0.2 (ref 0.0–1.0)
WBC: 16.6 10*3/uL — ABNORMAL HIGH (ref 4.0–10.5)
pH: 6 (ref 5.0–8.0)

## 2007-12-05 ENCOUNTER — Telehealth: Payer: Self-pay | Admitting: Internal Medicine

## 2007-12-13 ENCOUNTER — Ambulatory Visit: Payer: Self-pay | Admitting: Infectious Diseases

## 2007-12-13 ENCOUNTER — Encounter (INDEPENDENT_AMBULATORY_CARE_PROVIDER_SITE_OTHER): Payer: Self-pay | Admitting: Internal Medicine

## 2007-12-13 LAB — CONVERTED CEMR LAB
Nitrite: POSITIVE
Protein, U semiquant: 100
pH: 7

## 2007-12-14 ENCOUNTER — Telehealth: Payer: Self-pay | Admitting: Infectious Diseases

## 2007-12-14 LAB — CONVERTED CEMR LAB
Nitrite: POSITIVE — AB
Urobilinogen, UA: 1 (ref 0.0–1.0)
pH: 6.5 (ref 5.0–8.0)

## 2007-12-19 ENCOUNTER — Ambulatory Visit: Payer: Self-pay | Admitting: Internal Medicine

## 2007-12-19 ENCOUNTER — Encounter: Payer: Self-pay | Admitting: Internal Medicine

## 2007-12-19 LAB — CONVERTED CEMR LAB
Eosinophils Absolute: 0 10*3/uL (ref 0.0–0.7)
Hemoglobin, Urine: NEGATIVE
Leukocytes, UA: NEGATIVE
Lymphocytes Relative: 24 % (ref 12–46)
Lymphs Abs: 4.4 10*3/uL — ABNORMAL HIGH (ref 0.7–4.0)
MCV: 92.7 fL (ref 78.0–100.0)
Monocytes Relative: 5 % (ref 3–12)
Neutrophils Relative %: 71 % (ref 43–77)
RBC: 4.39 M/uL (ref 3.87–5.11)
Specific Gravity, Urine: 1.031 — ABNORMAL HIGH (ref 1.005–1.03)
Urine Glucose: NEGATIVE mg/dL
WBC: 18.6 10*3/uL — ABNORMAL HIGH (ref 4.0–10.5)
pH: 6.5 (ref 5.0–8.0)

## 2008-01-01 ENCOUNTER — Encounter
Admission: RE | Admit: 2008-01-01 | Discharge: 2008-03-31 | Payer: Self-pay | Admitting: Physical Medicine & Rehabilitation

## 2008-01-01 ENCOUNTER — Telehealth (INDEPENDENT_AMBULATORY_CARE_PROVIDER_SITE_OTHER): Payer: Self-pay | Admitting: *Deleted

## 2008-01-02 ENCOUNTER — Telehealth (INDEPENDENT_AMBULATORY_CARE_PROVIDER_SITE_OTHER): Payer: Self-pay | Admitting: Internal Medicine

## 2008-02-25 ENCOUNTER — Ambulatory Visit: Payer: Self-pay | Admitting: Physical Medicine & Rehabilitation

## 2008-03-06 ENCOUNTER — Encounter: Payer: Self-pay | Admitting: Internal Medicine

## 2008-03-07 ENCOUNTER — Encounter: Payer: Self-pay | Admitting: Internal Medicine

## 2008-03-10 ENCOUNTER — Telehealth: Payer: Self-pay | Admitting: *Deleted

## 2008-03-10 ENCOUNTER — Telehealth: Payer: Self-pay | Admitting: Infectious Diseases

## 2008-03-26 ENCOUNTER — Encounter (INDEPENDENT_AMBULATORY_CARE_PROVIDER_SITE_OTHER): Payer: Self-pay | Admitting: *Deleted

## 2008-04-08 ENCOUNTER — Telehealth (INDEPENDENT_AMBULATORY_CARE_PROVIDER_SITE_OTHER): Payer: Self-pay | Admitting: *Deleted

## 2008-04-18 ENCOUNTER — Encounter
Admission: RE | Admit: 2008-04-18 | Discharge: 2008-04-21 | Payer: Self-pay | Admitting: Physical Medicine & Rehabilitation

## 2008-04-21 ENCOUNTER — Ambulatory Visit: Payer: Self-pay | Admitting: Physical Medicine & Rehabilitation

## 2008-04-24 ENCOUNTER — Ambulatory Visit: Payer: Self-pay | Admitting: Infectious Diseases

## 2008-04-24 LAB — CONVERTED CEMR LAB
Blood Glucose, Fingerstick: 118
Hgb A1c MFr Bld: 7.5 %

## 2008-04-25 ENCOUNTER — Telehealth: Payer: Self-pay | Admitting: Internal Medicine

## 2008-05-06 ENCOUNTER — Ambulatory Visit (HOSPITAL_COMMUNITY): Admission: RE | Admit: 2008-05-06 | Discharge: 2008-05-06 | Payer: Self-pay | Admitting: Internal Medicine

## 2008-05-06 ENCOUNTER — Encounter: Payer: Self-pay | Admitting: Internal Medicine

## 2008-05-23 ENCOUNTER — Encounter: Payer: Self-pay | Admitting: Internal Medicine

## 2008-05-23 ENCOUNTER — Ambulatory Visit: Payer: Self-pay | Admitting: Internal Medicine

## 2008-05-23 LAB — CONVERTED CEMR LAB
BUN: 16 mg/dL (ref 6–23)
CO2: 29 meq/L (ref 19–32)
Chloride: 103 meq/L (ref 96–112)
Creatinine, Ser: 0.73 mg/dL (ref 0.40–1.20)
LDL Cholesterol: 131 mg/dL — ABNORMAL HIGH (ref 0–99)

## 2008-05-29 ENCOUNTER — Encounter: Admission: RE | Admit: 2008-05-29 | Discharge: 2008-05-29 | Payer: Self-pay | Admitting: Internal Medicine

## 2008-06-02 ENCOUNTER — Telehealth: Payer: Self-pay | Admitting: *Deleted

## 2008-06-03 ENCOUNTER — Encounter: Payer: Self-pay | Admitting: Internal Medicine

## 2008-06-03 ENCOUNTER — Ambulatory Visit: Payer: Self-pay | Admitting: Internal Medicine

## 2008-06-03 DIAGNOSIS — R928 Other abnormal and inconclusive findings on diagnostic imaging of breast: Secondary | ICD-10-CM | POA: Insufficient documentation

## 2008-06-03 LAB — CONVERTED CEMR LAB
ALT: 60 units/L — ABNORMAL HIGH (ref 0–35)
CO2: 26 meq/L (ref 19–32)
Calcium: 9.4 mg/dL (ref 8.4–10.5)
Chloride: 100 meq/L (ref 96–112)
Potassium: 4.7 meq/L (ref 3.5–5.3)
Sodium: 145 meq/L (ref 135–145)
Total Bilirubin: 0.4 mg/dL (ref 0.3–1.2)
Total Protein: 6.3 g/dL (ref 6.0–8.3)

## 2008-06-04 ENCOUNTER — Encounter: Admission: RE | Admit: 2008-06-04 | Discharge: 2008-06-04 | Payer: Self-pay | Admitting: Internal Medicine

## 2008-06-09 ENCOUNTER — Ambulatory Visit: Payer: Self-pay | Admitting: Internal Medicine

## 2008-06-09 ENCOUNTER — Telehealth: Payer: Self-pay | Admitting: Internal Medicine

## 2008-06-09 LAB — CONVERTED CEMR LAB: Blood Glucose, Fingerstick: 115

## 2008-06-17 ENCOUNTER — Encounter
Admission: RE | Admit: 2008-06-17 | Discharge: 2008-09-15 | Payer: Self-pay | Admitting: Physical Medicine & Rehabilitation

## 2008-06-18 ENCOUNTER — Ambulatory Visit: Payer: Self-pay | Admitting: Physical Medicine & Rehabilitation

## 2008-06-24 ENCOUNTER — Encounter: Payer: Self-pay | Admitting: Internal Medicine

## 2008-06-25 ENCOUNTER — Encounter: Payer: Self-pay | Admitting: Internal Medicine

## 2008-07-15 ENCOUNTER — Ambulatory Visit: Payer: Self-pay | Admitting: Internal Medicine

## 2008-07-15 ENCOUNTER — Encounter: Payer: Self-pay | Admitting: Internal Medicine

## 2008-07-16 ENCOUNTER — Ambulatory Visit: Payer: Self-pay | Admitting: Physical Medicine & Rehabilitation

## 2008-07-16 ENCOUNTER — Encounter: Payer: Self-pay | Admitting: Internal Medicine

## 2008-07-21 ENCOUNTER — Ambulatory Visit: Payer: Self-pay | Admitting: Internal Medicine

## 2008-07-21 ENCOUNTER — Encounter: Payer: Self-pay | Admitting: Internal Medicine

## 2008-07-21 LAB — CONVERTED CEMR LAB
CO2: 28 meq/L (ref 19–32)
Glucose, Bld: 59 mg/dL — ABNORMAL LOW (ref 70–99)
Sodium: 144 meq/L (ref 135–145)
Total Bilirubin: 0.4 mg/dL (ref 0.3–1.2)
Total Protein: 6.6 g/dL (ref 6.0–8.3)

## 2008-07-23 ENCOUNTER — Telehealth (INDEPENDENT_AMBULATORY_CARE_PROVIDER_SITE_OTHER): Payer: Self-pay | Admitting: *Deleted

## 2008-08-01 ENCOUNTER — Encounter: Payer: Self-pay | Admitting: Internal Medicine

## 2008-08-13 ENCOUNTER — Ambulatory Visit: Payer: Self-pay | Admitting: Physical Medicine & Rehabilitation

## 2008-08-20 ENCOUNTER — Encounter: Payer: Self-pay | Admitting: Internal Medicine

## 2008-08-20 ENCOUNTER — Ambulatory Visit: Payer: Self-pay | Admitting: Internal Medicine

## 2008-08-20 LAB — CONVERTED CEMR LAB: Blood Glucose, Fingerstick: 216

## 2008-09-10 ENCOUNTER — Ambulatory Visit: Payer: Self-pay | Admitting: Physical Medicine & Rehabilitation

## 2008-09-14 ENCOUNTER — Emergency Department (HOSPITAL_COMMUNITY): Admission: EM | Admit: 2008-09-14 | Discharge: 2008-09-14 | Payer: Self-pay | Admitting: Emergency Medicine

## 2008-09-22 ENCOUNTER — Encounter: Payer: Self-pay | Admitting: Internal Medicine

## 2008-09-22 ENCOUNTER — Ambulatory Visit: Payer: Self-pay | Admitting: *Deleted

## 2008-09-22 DIAGNOSIS — S93409A Sprain of unspecified ligament of unspecified ankle, initial encounter: Secondary | ICD-10-CM | POA: Insufficient documentation

## 2008-09-22 LAB — CONVERTED CEMR LAB: Blood Glucose, Fingerstick: 119

## 2008-10-01 ENCOUNTER — Encounter
Admission: RE | Admit: 2008-10-01 | Discharge: 2008-12-30 | Payer: Self-pay | Admitting: Physical Medicine & Rehabilitation

## 2008-10-02 ENCOUNTER — Ambulatory Visit: Payer: Self-pay | Admitting: Internal Medicine

## 2008-10-02 LAB — CONVERTED CEMR LAB: Blood Glucose, Home Monitor: 2 mg/dL

## 2008-10-07 ENCOUNTER — Encounter (INDEPENDENT_AMBULATORY_CARE_PROVIDER_SITE_OTHER): Payer: Self-pay | Admitting: Internal Medicine

## 2008-10-07 ENCOUNTER — Ambulatory Visit: Payer: Self-pay | Admitting: Infectious Disease

## 2008-10-07 ENCOUNTER — Ambulatory Visit (HOSPITAL_COMMUNITY): Admission: RE | Admit: 2008-10-07 | Discharge: 2008-10-07 | Payer: Self-pay | Admitting: Internal Medicine

## 2008-10-07 DIAGNOSIS — M25569 Pain in unspecified knee: Secondary | ICD-10-CM

## 2008-10-07 LAB — CONVERTED CEMR LAB
BUN: 13 mg/dL (ref 6–23)
Calcium: 9.5 mg/dL (ref 8.4–10.5)
GFR calc Af Amer: >60 mL/min (ref >60)
Glucose, Bld: 195 mg/dL — ABNORMAL HIGH (ref 70–99)
Sodium: 143 meq/L (ref 135–145)

## 2008-10-10 ENCOUNTER — Ambulatory Visit: Payer: Self-pay | Admitting: Physical Medicine & Rehabilitation

## 2008-10-16 ENCOUNTER — Ambulatory Visit: Payer: Self-pay | Admitting: Internal Medicine

## 2008-10-16 LAB — CONVERTED CEMR LAB: Blood Glucose, Fingerstick: 121

## 2008-10-31 ENCOUNTER — Ambulatory Visit: Payer: Self-pay | Admitting: Physical Medicine & Rehabilitation

## 2008-11-07 ENCOUNTER — Telehealth: Payer: Self-pay | Admitting: *Deleted

## 2008-11-18 ENCOUNTER — Ambulatory Visit: Payer: Self-pay | Admitting: Internal Medicine

## 2008-11-22 ENCOUNTER — Encounter: Payer: Self-pay | Admitting: Internal Medicine

## 2008-11-26 ENCOUNTER — Ambulatory Visit: Payer: Self-pay | Admitting: Physical Medicine & Rehabilitation

## 2008-11-27 ENCOUNTER — Ambulatory Visit (HOSPITAL_COMMUNITY)
Admission: RE | Admit: 2008-11-27 | Discharge: 2008-11-27 | Payer: Self-pay | Admitting: Physical Medicine & Rehabilitation

## 2008-12-02 ENCOUNTER — Ambulatory Visit: Payer: Self-pay | Admitting: Internal Medicine

## 2008-12-04 ENCOUNTER — Telehealth (INDEPENDENT_AMBULATORY_CARE_PROVIDER_SITE_OTHER): Payer: Self-pay | Admitting: *Deleted

## 2008-12-07 ENCOUNTER — Encounter: Payer: Self-pay | Admitting: Internal Medicine

## 2008-12-09 ENCOUNTER — Encounter: Payer: Self-pay | Admitting: Internal Medicine

## 2008-12-25 ENCOUNTER — Encounter: Payer: Self-pay | Admitting: Internal Medicine

## 2009-01-05 ENCOUNTER — Encounter: Payer: Self-pay | Admitting: Internal Medicine

## 2009-01-09 ENCOUNTER — Encounter: Payer: Self-pay | Admitting: Internal Medicine

## 2009-01-12 ENCOUNTER — Encounter: Payer: Self-pay | Admitting: Internal Medicine

## 2009-01-16 ENCOUNTER — Ambulatory Visit: Payer: Self-pay | Admitting: Infectious Diseases

## 2009-01-16 DIAGNOSIS — K219 Gastro-esophageal reflux disease without esophagitis: Secondary | ICD-10-CM

## 2009-01-16 LAB — CONVERTED CEMR LAB: Blood Glucose, Fingerstick: 131

## 2009-01-19 ENCOUNTER — Telehealth: Payer: Self-pay | Admitting: Internal Medicine

## 2009-01-19 DIAGNOSIS — D649 Anemia, unspecified: Secondary | ICD-10-CM | POA: Insufficient documentation

## 2009-01-19 LAB — CONVERTED CEMR LAB
ALT: 25 units/L (ref 0–35)
AST: 16 units/L (ref 0–37)
Albumin: 4 g/dL (ref 3.5–5.2)
Alkaline Phosphatase: 52 units/L (ref 39–117)
Calcium: 9.3 mg/dL (ref 8.4–10.5)
Chloride: 101 meq/L (ref 96–112)
Creatinine, Urine: 87.3 mg/dL
LDL Cholesterol: 80 mg/dL (ref 0–99)
Lymphocytes Relative: 38 % (ref 12–46)
Lymphs Abs: 4.4 10*3/uL — ABNORMAL HIGH (ref 0.7–4.0)
Microalb Creat Ratio: 5.7 mg/g (ref 0.0–30.0)
Microalb, Ur: 0.5 mg/dL (ref 0.00–1.89)
Neutro Abs: 6.3 10*3/uL (ref 1.7–7.7)
Neutrophils Relative %: 55 % (ref 43–77)
Platelets: 258 10*3/uL (ref 150–400)
Potassium: 4.4 meq/L (ref 3.5–5.3)
Total CHOL/HDL Ratio: 3.5
WBC: 11.4 10*3/uL — ABNORMAL HIGH (ref 4.0–10.5)

## 2009-01-26 ENCOUNTER — Inpatient Hospital Stay (HOSPITAL_COMMUNITY): Admission: AD | Admit: 2009-01-26 | Discharge: 2009-01-29 | Payer: Self-pay | Admitting: Internal Medicine

## 2009-01-26 ENCOUNTER — Encounter: Payer: Self-pay | Admitting: Internal Medicine

## 2009-01-26 ENCOUNTER — Ambulatory Visit: Payer: Self-pay | Admitting: Internal Medicine

## 2009-01-26 LAB — CONVERTED CEMR LAB
Glucose, Urine, Semiquant: 100
Protein, U semiquant: 100
Specific Gravity, Urine: >=1.03
Urobilinogen, UA: 2
WBC Urine, dipstick: NEGATIVE

## 2009-01-28 ENCOUNTER — Encounter: Payer: Self-pay | Admitting: Internal Medicine

## 2009-02-02 ENCOUNTER — Inpatient Hospital Stay (HOSPITAL_COMMUNITY): Admission: EM | Admit: 2009-02-02 | Discharge: 2009-02-17 | Payer: Self-pay | Admitting: Emergency Medicine

## 2009-02-02 ENCOUNTER — Ambulatory Visit: Payer: Self-pay | Admitting: Internal Medicine

## 2009-02-02 ENCOUNTER — Encounter: Payer: Self-pay | Admitting: Internal Medicine

## 2009-02-03 ENCOUNTER — Encounter: Payer: Self-pay | Admitting: Internal Medicine

## 2009-02-05 ENCOUNTER — Encounter (INDEPENDENT_AMBULATORY_CARE_PROVIDER_SITE_OTHER): Payer: Self-pay | Admitting: Gastroenterology

## 2009-02-09 ENCOUNTER — Encounter (INDEPENDENT_AMBULATORY_CARE_PROVIDER_SITE_OTHER): Payer: Self-pay | Admitting: Surgery

## 2009-02-16 ENCOUNTER — Encounter: Payer: Self-pay | Admitting: Internal Medicine

## 2009-02-19 ENCOUNTER — Telehealth: Payer: Self-pay | Admitting: Internal Medicine

## 2009-02-27 ENCOUNTER — Ambulatory Visit: Payer: Self-pay | Admitting: Internal Medicine

## 2009-02-28 ENCOUNTER — Encounter: Payer: Self-pay | Admitting: Internal Medicine

## 2009-03-02 LAB — CONVERTED CEMR LAB
ALT: 23 units/L (ref 0–35)
AST: 24 units/L (ref 0–37)
Albumin: 3.7 g/dL (ref 3.5–5.2)
CO2: 27 meq/L (ref 19–32)
Calcium: 9 mg/dL (ref 8.4–10.5)
Chloride: 104 meq/L (ref 96–112)
Creatinine, Ser: 0.65 mg/dL (ref 0.40–1.20)
HCT: 34.6 % — ABNORMAL LOW (ref 36.0–46.0)
Platelets: 320 10*3/uL (ref 150–400)
Potassium: 4.6 meq/L (ref 3.5–5.3)
RDW: 17.7 % — ABNORMAL HIGH (ref 11.5–15.5)
Sodium: 141 meq/L (ref 135–145)
Total Protein: 6.7 g/dL (ref 6.0–8.3)
WBC: 10 10*3/uL (ref 4.0–10.5)

## 2009-03-10 ENCOUNTER — Encounter
Admission: RE | Admit: 2009-03-10 | Discharge: 2009-04-30 | Payer: Self-pay | Admitting: Physical Medicine & Rehabilitation

## 2009-03-11 ENCOUNTER — Ambulatory Visit: Payer: Self-pay | Admitting: Physical Medicine & Rehabilitation

## 2009-05-25 ENCOUNTER — Telehealth: Payer: Self-pay | Admitting: Internal Medicine

## 2009-06-01 ENCOUNTER — Ambulatory Visit: Payer: Self-pay | Admitting: Internal Medicine

## 2009-06-01 LAB — CONVERTED CEMR LAB: Hgb A1c MFr Bld: 7 %

## 2009-06-04 ENCOUNTER — Encounter
Admission: RE | Admit: 2009-06-04 | Discharge: 2009-08-27 | Payer: Self-pay | Admitting: Physical Medicine & Rehabilitation

## 2009-06-11 ENCOUNTER — Ambulatory Visit: Payer: Self-pay | Admitting: Physical Medicine & Rehabilitation

## 2009-07-08 ENCOUNTER — Ambulatory Visit: Payer: Self-pay | Admitting: Physical Medicine & Rehabilitation

## 2009-07-16 ENCOUNTER — Ambulatory Visit: Payer: Self-pay | Admitting: Internal Medicine

## 2009-07-16 LAB — CONVERTED CEMR LAB
ALT: 45 units/L — ABNORMAL HIGH (ref 0–35)
Alkaline Phosphatase: 73 units/L (ref 39–117)
Basophils Absolute: 0 10*3/uL (ref 0.0–0.1)
Basophils Relative: 0 % (ref 0–1)
CO2: 25 meq/L (ref 19–32)
Creatinine, Ser: 0.83 mg/dL (ref 0.40–1.20)
Eosinophils Absolute: 0 10*3/uL (ref 0.0–0.7)
Lipase: 22 units/L (ref 0–75)
MCHC: 30.7 g/dL (ref 30.0–36.0)
MCV: 90.7 fL (ref >=78.0)
Monocytes Relative: 5 % (ref 3–12)
Neutro Abs: 7 10*3/uL (ref 1.7–7.7)
Neutrophils Relative %: 64 % (ref 43–77)
Platelets: 264 10*3/uL (ref 150–400)
RDW: 14.8 % (ref 11.5–15.5)
Sodium: 136 meq/L (ref 135–145)
Total Bilirubin: 0.8 mg/dL (ref 0.3–1.2)
Total Protein: 6.8 g/dL (ref 6.0–8.3)

## 2009-07-20 ENCOUNTER — Telehealth: Payer: Self-pay | Admitting: Internal Medicine

## 2009-08-06 ENCOUNTER — Ambulatory Visit: Payer: Self-pay | Admitting: Physical Medicine & Rehabilitation

## 2009-08-10 ENCOUNTER — Telehealth: Payer: Self-pay | Admitting: Internal Medicine

## 2009-08-19 ENCOUNTER — Ambulatory Visit: Payer: Self-pay | Admitting: Internal Medicine

## 2009-08-19 ENCOUNTER — Encounter: Payer: Self-pay | Admitting: Internal Medicine

## 2009-08-19 LAB — CONVERTED CEMR LAB: Hgb A1c MFr Bld: 6.8 %

## 2009-08-27 ENCOUNTER — Encounter
Admission: RE | Admit: 2009-08-27 | Discharge: 2009-11-20 | Payer: Self-pay | Admitting: Physical Medicine & Rehabilitation

## 2009-09-03 ENCOUNTER — Ambulatory Visit: Payer: Self-pay | Admitting: Physical Medicine & Rehabilitation

## 2009-09-08 ENCOUNTER — Ambulatory Visit: Payer: Self-pay | Admitting: Internal Medicine

## 2009-09-08 DIAGNOSIS — S025XXA Fracture of tooth (traumatic), initial encounter for closed fracture: Secondary | ICD-10-CM | POA: Insufficient documentation

## 2009-09-08 LAB — CONVERTED CEMR LAB: Blood Glucose, Fingerstick: 143

## 2009-09-23 ENCOUNTER — Telehealth: Payer: Self-pay | Admitting: Internal Medicine

## 2009-09-24 ENCOUNTER — Telehealth: Payer: Self-pay | Admitting: Internal Medicine

## 2009-09-25 ENCOUNTER — Telehealth: Payer: Self-pay | Admitting: Internal Medicine

## 2009-09-30 ENCOUNTER — Ambulatory Visit: Payer: Self-pay | Admitting: Physical Medicine & Rehabilitation

## 2009-10-02 ENCOUNTER — Encounter (INDEPENDENT_AMBULATORY_CARE_PROVIDER_SITE_OTHER): Payer: Self-pay | Admitting: *Deleted

## 2009-10-03 ENCOUNTER — Telehealth: Payer: Self-pay | Admitting: Internal Medicine

## 2009-10-28 ENCOUNTER — Ambulatory Visit: Payer: Self-pay | Admitting: Physical Medicine & Rehabilitation

## 2009-11-20 ENCOUNTER — Encounter
Admission: RE | Admit: 2009-11-20 | Discharge: 2010-02-18 | Payer: Self-pay | Admitting: Physical Medicine & Rehabilitation

## 2009-11-24 ENCOUNTER — Encounter: Payer: Self-pay | Admitting: Internal Medicine

## 2009-11-24 ENCOUNTER — Ambulatory Visit: Payer: Self-pay | Admitting: Internal Medicine

## 2009-11-24 ENCOUNTER — Ambulatory Visit (HOSPITAL_COMMUNITY): Admission: RE | Admit: 2009-11-24 | Discharge: 2009-11-24 | Payer: Self-pay | Admitting: Internal Medicine

## 2009-11-24 LAB — CONVERTED CEMR LAB
Blood Glucose, Fingerstick: 142
Hgb A1c MFr Bld: 7.3 %

## 2009-11-24 LAB — HM DIABETES FOOT EXAM

## 2009-11-27 ENCOUNTER — Telehealth: Payer: Self-pay | Admitting: *Deleted

## 2009-12-01 ENCOUNTER — Ambulatory Visit: Payer: Self-pay | Admitting: Physical Medicine & Rehabilitation

## 2010-01-04 ENCOUNTER — Ambulatory Visit: Payer: Self-pay | Admitting: Internal Medicine

## 2010-01-04 DIAGNOSIS — R079 Chest pain, unspecified: Secondary | ICD-10-CM | POA: Insufficient documentation

## 2010-01-18 ENCOUNTER — Telehealth (INDEPENDENT_AMBULATORY_CARE_PROVIDER_SITE_OTHER): Payer: Self-pay | Admitting: *Deleted

## 2010-01-26 ENCOUNTER — Ambulatory Visit: Payer: Self-pay | Admitting: Physical Medicine & Rehabilitation

## 2010-02-01 ENCOUNTER — Telehealth (INDEPENDENT_AMBULATORY_CARE_PROVIDER_SITE_OTHER): Payer: Self-pay | Admitting: *Deleted

## 2010-02-02 ENCOUNTER — Encounter: Payer: Self-pay | Admitting: Cardiology

## 2010-02-02 ENCOUNTER — Encounter: Payer: Self-pay | Admitting: Internal Medicine

## 2010-02-02 ENCOUNTER — Ambulatory Visit: Payer: Self-pay | Admitting: Cardiology

## 2010-02-02 ENCOUNTER — Ambulatory Visit (HOSPITAL_COMMUNITY): Admission: RE | Admit: 2010-02-02 | Discharge: 2010-02-02 | Payer: Self-pay | Admitting: Internal Medicine

## 2010-02-02 ENCOUNTER — Ambulatory Visit: Payer: Self-pay | Admitting: Internal Medicine

## 2010-02-02 ENCOUNTER — Ambulatory Visit: Payer: Self-pay

## 2010-02-02 ENCOUNTER — Encounter (HOSPITAL_COMMUNITY)
Admission: RE | Admit: 2010-02-02 | Discharge: 2010-04-12 | Payer: Self-pay | Source: Home / Self Care | Admitting: Internal Medicine

## 2010-02-15 ENCOUNTER — Encounter: Payer: Self-pay | Admitting: Internal Medicine

## 2010-02-15 ENCOUNTER — Telehealth: Payer: Self-pay | Admitting: Internal Medicine

## 2010-02-15 LAB — CONVERTED CEMR LAB
Total CHOL/HDL Ratio: 5
Triglycerides: 165 mg/dL — ABNORMAL HIGH (ref 0.0–149.0)

## 2010-03-01 ENCOUNTER — Telehealth: Payer: Self-pay | Admitting: Internal Medicine

## 2010-03-01 ENCOUNTER — Emergency Department (HOSPITAL_COMMUNITY): Admission: EM | Admit: 2010-03-01 | Discharge: 2010-03-01 | Payer: Self-pay | Admitting: Emergency Medicine

## 2010-03-08 ENCOUNTER — Telehealth: Payer: Self-pay | Admitting: Internal Medicine

## 2010-03-08 ENCOUNTER — Ambulatory Visit: Payer: Self-pay | Admitting: Internal Medicine

## 2010-03-08 LAB — CONVERTED CEMR LAB: Blood Glucose, Fingerstick: 145

## 2010-03-16 ENCOUNTER — Telehealth: Payer: Self-pay | Admitting: *Deleted

## 2010-03-16 ENCOUNTER — Telehealth: Payer: Self-pay | Admitting: Internal Medicine

## 2010-03-17 ENCOUNTER — Telehealth: Payer: Self-pay | Admitting: Internal Medicine

## 2010-03-22 ENCOUNTER — Encounter
Admission: RE | Admit: 2010-03-22 | Discharge: 2010-05-21 | Payer: Self-pay | Source: Home / Self Care | Attending: Physical Medicine & Rehabilitation | Admitting: Physical Medicine & Rehabilitation

## 2010-03-24 ENCOUNTER — Telehealth: Payer: Self-pay | Admitting: Internal Medicine

## 2010-03-26 ENCOUNTER — Telehealth: Payer: Self-pay | Admitting: Internal Medicine

## 2010-03-29 ENCOUNTER — Telehealth: Payer: Self-pay | Admitting: Internal Medicine

## 2010-03-30 ENCOUNTER — Telehealth: Payer: Self-pay | Admitting: *Deleted

## 2010-03-30 ENCOUNTER — Ambulatory Visit: Payer: Self-pay | Admitting: Physical Medicine & Rehabilitation

## 2010-04-21 ENCOUNTER — Ambulatory Visit: Payer: Self-pay | Admitting: Internal Medicine

## 2010-04-21 ENCOUNTER — Encounter: Payer: Self-pay | Admitting: Internal Medicine

## 2010-05-07 IMAGING — NM NM LIVER FUNCTION STUDY
1 series · 6 of 6 positions shown · non-contrast
Comparison: 

CLINICAL DATA: History given of previous cholecystectomy.
Continued abdominal pain.  Evaluation for biliary leak .

NUCLEAR MEDICINE HEPATOBILIARY IMAGING
TECHNIQUE: Sequential images of the abdomen were obtained [DATE] minutes following intravenous administration of
radiopharmaceutical.
Radiopharmaceutical:  5.1 mCi 8c-WWm Choletec

[Series 0: hepatobiliary · 3.20mm/px · 6 of 60 frames shown]
[frame 6/60]
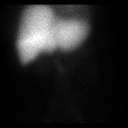
[frame 16/60]
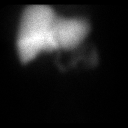
[frame 26/60]
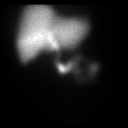
[frame 36/60]
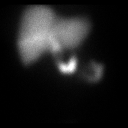
[frame 46/60]
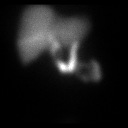
[frame 56/60]
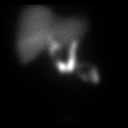

[6 of 6 positions shown; findings below may reference images not displayed]

FINDINGS: There is prompt visualization of hepatic activity, bile
duct activity and intestinal activity.  This indicates patency of
the common bile duct.  History given of previous cholecystectomy.
No gallbladder was evident.

There is no evidence of biliary leak.
IMPRESSION: There is demonstration of patency of the common bile duct.  There
is no evidence of biliary leak.  Status post cholecystectomy.

## 2010-05-18 ENCOUNTER — Telehealth: Payer: Self-pay | Admitting: Internal Medicine

## 2010-05-21 ENCOUNTER — Encounter
Admission: RE | Admit: 2010-05-21 | Discharge: 2010-06-08 | Payer: Self-pay | Source: Home / Self Care | Attending: Physical Medicine & Rehabilitation | Admitting: Physical Medicine & Rehabilitation

## 2010-05-25 ENCOUNTER — Ambulatory Visit: Admit: 2010-05-25 | Payer: Self-pay | Admitting: Physical Medicine & Rehabilitation

## 2010-05-27 ENCOUNTER — Telehealth: Payer: Self-pay | Admitting: Internal Medicine

## 2010-05-30 ENCOUNTER — Encounter: Payer: Self-pay | Admitting: Physical Medicine & Rehabilitation

## 2010-05-31 ENCOUNTER — Encounter: Payer: Self-pay | Admitting: Internal Medicine

## 2010-06-10 NOTE — Progress Notes (Signed)
Summary: refill/gg  Phone Note Refill Request  on May 27, 2010 2:03 PM  Refills Requested: Medication #1:  LANCETS  MISC use to check your blood sugar 3-4x daily  Method Requested: Electronic Initial call taken by: Merrie Roof RN,  May 27, 2010 2:06 PM  Follow-up for Phone Call       Follow-up by: Blondell Reveal MD,  May 27, 2010 2:15 PM    Prescriptions: LANCETS  MISC (LANCETS) use to check your blood sugar 3-4x daily  #200 x 10   Entered and Authorized by:   Blondell Reveal MD   Signed by:   Blondell Reveal MD on 05/27/2010   Method used:   Electronically to        [facility] (retail)       [address]       [place], [place]  [address]       Ph: [phone]       Fax: [phone]   RxID:   [id number]

## 2010-06-10 NOTE — Assessment & Plan Note (Signed)
Summary: N/V/gg   Vital Signs:  Patient profile:   55 year old female Height:      64 inches (162.56 cm) Weight:      210.2 pounds (95.55 kg) BMI:     36.21 Temp:     99.1 degrees F (37.28 degrees C) oral Pulse rate:   126 / minute Pulse (ortho):   130 / minute BP sitting:   123 / 81  (right arm) BP standing:   114 / 77 Cuff size:   large  Vitals Entered By: Krystal Eaton Duncan Dull) (July 16, 2009 2:25 PM)  Serial Vital Signs/Assessments:  Time      Position  BP       Pulse  Resp  Temp     By 2:35 PM   Lying RA  132/86   119                   Kay Goldston,CMA (AAMA) 2:37 PM   Sitting   122/85   121                   Kay Goldston,CMA (AAMA) 2:38 PM   Standing  114/77   130                   Kay Goldston,CMA (AAMA)  CC: pt c/o NVD  Is Patient Diabetic? Yes Nutritional Status BMI of > 30 = obese  Have you ever been in a relationship where you felt threatened, hurt or afraid?No   Does patient need assistance? Functional Status Self care Ambulation Impaired:Risk for fall Comments uses a crutch s/p ankle injury ago   Primary Care Provider:  Blondell Reveal MD  CC:  pt c/o NVD .  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 Chronic back pain on methadone and Vicodin. Ankle pain for last 6 months on crutches.  Comes in today for sever nausea and vomiting for last 6 days. She is out of her nausea meds reglan that she takes regularly. Started having Nausea on friday which progressed to vomitings and diarrhea on sunday. No fever or chills noted and she says that she was gettig better untill yesterday but today she again started having severe nausea and decided to come to [facility]. Also complains that she has been not been able to tolerate food since friday. Had similar complaints since Nov 2010 and has had gall bladder removal for the  same.    Preventive Screening-Counseling & Management  Alcohol-Tobacco     Alcohol type: AT TIMES/ HOLIDAYS  / WINE     Smoking Status: never     Passive Smoke Exposure: no  Problems Prior to Update: 1)  Anemia  (ICD-285.9) 2)  Gastroesophageal Reflux Disease  (ICD-530.81) 3)  Knee Pain, Left  (ICD-719.46) 4)  Ankle Sprain, Left  (ICD-845.00) 5)  Abnormal Mammogram, Right Breast  (ICD-793.80) 6)  Uti  (ICD-599.0) 7)  Epigastric Pain  (ICD-789.06) 8)  Dm  (ICD-250.00) 9)  Temporal Arteritis  (ICD-446.5) 10)  Transaminases, Serum, Elevated  (ICD-790.4) 11)  Hyponatremia  (ICD-276.1) 12)  Hyperglycemia  (ICD-790.29) 13)  Shoulder Pain, Left, Chronic  (ICD-719.41) 14)  Headache  (ICD-784.0) 15)  Weakness  (ICD-780.79) 16)  Dysuria  (ICD-788.1) 17)  Symptom, Rash, Oth Nonspecific Skin Eruption  (ICD-782.1) 18)  Frostbite  (ICD-991.3) 19)  Obesity Nos  (ICD-278.00) 20)  Hypertension  (ICD-401.9) 21)  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 Take One Tablet 2x Daily As Needed For Pain 2)  Amitriptyline Hcl 100 Mg  Tabs (Amitriptyline Hcl) .... Take 2 Tablets By Mouth At Bedtime 3)  Flexeril 10 Mg Tabs (Cyclobenzaprine Hcl) .... Take One Tablet By Mouth Every 8 Hours As Needed For Muscle Cramps 4)  Methadone Hcl 10 Mg Tabs (Methadone Hcl) .... Take 1 Tablet By Mouth 2x Times A Day 5)  Prednisone 20 Mg  Tabs (Prednisone) .... Take 1 Tablet By Mouth Once A Day 6)  Metformin Hcl 1000 Mg  Tabs (Metformin Hcl) .... Take 1 Tablet, Daily Twice, By Mouth 7)  Glipizide 5 Mg  Tabs (Glipizide) .... Take 2 Tablets By Mouth Once A Day 8)  Oscal 500/200 D-3 500-200 Mg-Unit  Tabs (Calcium-Vitamin D) .... Take 1 Tablet, Three Times Daily, By Mouth. 9)  Relion N 100 Unit/ml  Susp (Insulin Isophane Human) .... Inject 45 Units Subcutaneous in The Morning and 25 Units At Bedtime Daily 10)   Relion R 100 Unit/ml Soln (Insulin Regular Human) .... Inject Into The Skin of Your Abdomen Using 2:50 Correction Scale Plus 1:15 Food Scale As Instructed 3 Times A Day 30 Mins Before Meals. 11)  Lisinopril 10 Mg Tabs (Lisinopril) .... Take 1 Tablet By Mouth Once A Day 12)  Insulin Syringe 30g X 1/2" 1 Ml Misc (Insulin Syringe-Needle U-100) .... Use To Inject Insulin 4x Daily 13)  Freestyle Lite Test  Strp (Glucose Blood) .... Use To Checj Your Blood Sugar 3-4x Daily 14)  Lancets  Misc (Lancets) .... Use To Check Your Blood Sugar 3-4x Daily 15)  Fosamax 35 Mg Tabs (Alendronate Sodium) .... Take 1 Tablet By Mouth Every Week. Avoid Lying Down For 30 Min. Drink With 1 [employer]. 16)  Omeprazole 20 Mg Tbec (Omeprazole) .... Take 2 Tablets By Mouth in The Morning Before Breakfast 17)  Reglan 5 Mg Tabs (Metoclopramide Hcl) .Marland Kitchen.. 1 Tablet By Mouth Before Meals and At Bedtime. 18)  Toprol Xl 50 Mg Xr24h-Tab (Metoprolol Succinate) .... Take 1 Tablet By Mouth Once A Day  Current Medications (verified): 1)  Hydrocodone-Acetaminophen 10-325 Mg  Tabs (Hydrocodone-Acetaminophen) .... Take One Tablet 2x Daily As Needed For Pain 2)  Amitriptyline Hcl 100 Mg  Tabs (Amitriptyline Hcl) .... Take 2 Tablets By Mouth At Bedtime 3)  Flexeril 10 Mg Tabs (Cyclobenzaprine Hcl) .... Take One Tablet By Mouth Every 8 Hours As Needed For Muscle Cramps 4)  Methadone Hcl 10 Mg Tabs (Methadone Hcl) .... Take 1 Tablet By Mouth 2x Times A Day 5)  Metformin Hcl 1000 Mg  Tabs (Metformin Hcl) .... Take 1 Tablet, Daily Twice, By Mouth 6)  Glipizide 5 Mg  Tabs (Glipizide) .... Take 2 Tablets By Mouth Once A Day 7)  Oscal 500/200 D-3 500-200 Mg-Unit  Tabs (Calcium-Vitamin D) .... Take 1 Tablet, Three Times Daily, By Mouth. 8)  Relion N 100 Unit/ml  Susp (Insulin Isophane Human) .... Inject 45 Units Subcutaneous in The Morning and 25 Units At Bedtime Daily 9)  Relion R 100 Unit/ml Soln (Insulin Regular Human) .... Inject Into The Skin  of Your Abdomen Using 2:50 Correction Scale Plus 1:15 Food Scale As Instructed 3 Times A Day 30 Mins Before Meals. 10)  Lisinopril 10 Mg Tabs (Lisinopril) .... Take 1 Tablet By Mouth Once A Day 11)  Insulin Syringe 30g X 1/2" 1 Ml  Misc (Insulin Syringe-Needle U-100) .... Use To Inject Insulin 4x Daily 12)  Freestyle Lite Test  Strp (Glucose Blood) .... Use To Checj Your Blood Sugar 3-4x Daily 13)  Lancets  Misc (Lancets) .... Use To Check Your Blood Sugar 3-4x Daily 14)  Fosamax 35 Mg Tabs (Alendronate Sodium) .... Take 1 Tablet By Mouth Every Week. Avoid Lying Down For 30 Min. Drink With 1 [employer]. 15)  Omeprazole 20 Mg Tbec (Omeprazole) .... Take 2 Tablets By Mouth in The Morning Before Breakfast 16)  Reglan 5 Mg Tabs (Metoclopramide Hcl) .Marland Kitchen.. 1 Tablet By Mouth Before Meals and At Bedtime. 17)  Toprol Xl 50 Mg Xr24h-Tab (Metoprolol Succinate) .... Take 1 Tablet By Mouth Once A Day 18)  Prednisone 5 Mg Tabs (Prednisone) .... .take 3 Tabs With Meal Every Day For Next 1 Month and Then Decrease To 2 Tabs A Day Untill You See Md. 19)  Promethazine Hcl 25 Mg Tabs (Promethazine Hcl) .... Take 1 Tab As Needed For Nausea.  Allergies: 1)  ! Percocet 2)  ! Darvocet 3)  ! * Citric Acid  Past History:  Past Medical History: Last updated: 12/13/2007 Panic disorder with agoraphobia Hypertension Frostbite of left breast, hx of (7/07) Dysfunctional uterine bleeding, hx of, s/p hysterectomy Torn tendon in L shoulder R shoulder pain - bone cracked but went into the muscle.  Broke ring toe in 2001/2002 Temporal arteritis 05/09. DM/Hyperglycemia - 06/09. Severe cystitis post vaginal hysterectomy in 2003, s/p ABx bladder instillation X2 by urology  Past Surgical History: Last updated: 12/19/2007 Hysterectomy Oophorectomy. Temporal artery biopsy on 10/11/2007. Cystoscpy and hydraulic bladder distension.  Family History: Last updated: 2007/11/18 Father died in a road accident at 84. Mother-  HTN.  Social History: Last updated: 12/15/2006 Pt's phone number is [phone]  Risk Factors: Exercise: yes (06/01/2009)  Risk Factors: Smoking Status: never (07/16/2009) Passive Smoke Exposure: no (07/16/2009)  Review of Systems      See HPI  Physical Exam  Additional Exam:  Gen: AOx3, in acute distress due to pain and nausea, using crutch while walking Eyes: PERRL, EOMI ENT:MMM, No erythema noted in posterior pharynx Neck: No JVD, No LAP Chest: CTAB with  good respiratory effort CVS: regular rhythmic rate, tachycardic,  NO M/R/G, S1 S2 normal Abdo: soft, ND, tenderness present in entire abdomen especially epigastric region, BS + in all 4 quadrants EXT: No odema noted Neuro: Non focal, gait is normal Skin: no rashes noted.    Impression & Recommendations:  Problem # 1:  NAUSEA AND VOMITING (ICD-787.01) Assessment New This is something which is not new and in her own words she is getting better. Me and Dr Aundria Rud discussed about the potential causes of her nausea. She is on a bunch of Narcotics, diabetes induced gastroperesis, worsening of  hiatal hernia, prednisone induced gastropathy, pancreatitis and new onset viral gastroenteritis. We will check basic labs with CBC and lipase. She agreed to treat herself at home and the plan was discussed  with her. She is not orthostatic and already accepting fluids. She agreed to return to ER if her condition worsens. Orders: T-Comprehensive Metabolic Panel [id number]) T-CBC w/Diff [id number]) T-Lipase [id number])  Problem # 2:  DM (ICD-250.00) Assessment: Unchanged Her last Hba1c was 7.5 and has been compliant with her meds. This prednisone induced and me and Dr Aundria Rud plan to taper her prednisone down. Her updated medication list for this problem includes:    Metformin Hcl 1000 Mg Tabs (Metformin hcl) .Marland Kitchen... Take 1 tablet, daily  twice, by mouth    Glipizide 5 Mg Tabs (Glipizide) .Marland Kitchen... Take 2 tablets by mouth once a day     Relion N 100 Unit/ml Susp (Insulin isophane human) ..... Inject 45 units subcutaneous in the morning and 25 units at bedtime daily    Relion R 100 Unit/ml Soln (Insulin regular human) ..... Inject into the skin of your abdomen using 2:50 correction scale plus 1:15 food scale as instructed 3 times a day 30 mins before meals.    Lisinopril 10 Mg Tabs (Lisinopril) .Marland Kitchen... Take 1 tablet by mouth once a day  Labs Reviewed: Creat: 0.65 (02/28/2009)     Last Eye Exam: No diabetic retinopathy.    (03/26/2008) Reviewed HgBA1c results: 7.0 (06/01/2009)  7.5 (01/16/2009)  Problem # 3:  TEMPORAL ARTERITIS (ICD-446.5) Looking abck at her previous record, she has had her temporal artery biopsy negatve for inflammation. We plan to taper the prednisone down as it highly unlikely that she has active temporal arteritis. We discussed this plan with patient and feel that prednisone is root cause of all her problems. She agrees that some deterioration may occur while we try to taper down. Plan is to decrease i to 15 mg for next 1 month and then to 10 mg untill she sees an MD.  Problem # 4:  HYPERTENSION (ICD-401.9) Well controlled. Her updated medication list for this problem includes:    Lisinopril 10 Mg Tabs (Lisinopril) .Marland Kitchen... Take 1 tablet by mouth once a day    Toprol Xl 50 Mg Xr24h-tab (Metoprolol succinate) .Marland Kitchen... Take 1 tablet by mouth once a day  BP today: 123/81 Prior BP: 132/80 (06/01/2009)  Labs Reviewed: K+: 4.6 (02/28/2009) Creat: : 0.65 (02/28/2009)   Chol: 176 (01/16/2009)   HDL: 50 (01/16/2009)   LDL: 80 (01/16/2009)   TG: 232 (01/16/2009)  Problem # 5:  Preventive Health Care (ICD-V70.0) uptodate with vaccination and preventive procedures at this time.  Complete Medication List: 1)  Hydrocodone-acetaminophen 10-325 Mg Tabs (Hydrocodone-acetaminophen) .... Take one tablet 2x daily as needed for pain 2)  Amitriptyline Hcl 100 Mg Tabs (Amitriptyline hcl) .... Take 2 tablets by mouth at  bedtime 3)  Flexeril 10 Mg Tabs (Cyclobenzaprine hcl) .... Take one tablet by mouth every 8 hours as needed for muscle cramps 4)  Methadone Hcl 10 Mg Tabs (Methadone hcl) .... Take 1 tablet by mouth 2x times a day 5)  Metformin Hcl 1000 Mg Tabs (Metformin hcl) .... Take 1 tablet, daily twice, by mouth 6)  Glipizide 5 Mg Tabs (Glipizide) .... Take 2 tablets by mouth once a day 7)  Oscal 500/200 D-3 500-200 Mg-unit Tabs (Calcium-vitamin d) .... Take 1 tablet, three times daily, by mouth. 8)  Relion N 100 Unit/ml Susp (Insulin isophane human) .... Inject 45 units subcutaneous in the morning and 25 units at bedtime daily 9)  Relion R 100 Unit/ml Soln (Insulin regular human) .... Inject into the skin of your abdomen using 2:50 correction scale plus 1:15 food scale as instructed 3 times a day 30 mins before meals. 10)  Lisinopril 10 Mg Tabs (Lisinopril) .... Take 1 tablet by mouth once a day 11)  Insulin Syringe 30g X 1/2" 1 Ml Misc (Insulin syringe-needle u-100) .... Use to inject insulin 4x daily 12)  Freestyle Lite Test Strp (Glucose blood) .... Use to checj your blood sugar 3-4x daily 13)  Lancets Misc (Lancets) .... Use to check your blood sugar 3-4x daily 14)  Fosamax 35 Mg Tabs (Alendronate sodium) .... Take 1  tablet by mouth every week. avoid lying down for 30 min. drink with 1 plenty water. 15)  Omeprazole 20 Mg Tbec (Omeprazole) .... Take 2 tablets by mouth in the morning before breakfast 16)  Reglan 5 Mg Tabs (Metoclopramide hcl) .Marland Kitchen.. 1 tablet by mouth before meals and at bedtime. 17)  Toprol Xl 50 Mg Xr24h-tab (Metoprolol succinate) .... Take 1 tablet by mouth once a day 18)  Prednisone 5 Mg Tabs (Prednisone) .... .take 3 tabs with meal every day for next 1 month and then decrease to 2 tabs a day untill you see md. 19)  Promethazine Hcl 25 Mg Tabs (Promethazine hcl) .... Take 1 tab as needed for nausea.  Patient Instructions: 1)  Please schedule a follow-up appointment in 1 month. 2)   Please take your your nausea medicine as prescribed. 3)  Please keep your self hydrated and start with the diet plan as discussed in the clinic. 4)  Return to clinc or call 911 if your condition becomes worse. Prescriptions: PROMETHAZINE HCL 25 MG TABS (PROMETHAZINE HCL) take 1 tab as needed for nausea.  #30 x 1   Entered and Authorized by:   Lars Mage MD   Signed by:   Lars Mage MD on 07/16/2009   Method used:   Electronically to        [facility] (retail)       [address]       [place], [place]  [address]       Ph: [phone]       Fax: [phone]   RxID:   [id number] REGLAN 5 MG TABS (METOCLOPRAMIDE HCL) 1 tablet by mouth before meals and at bedtime.  #120 x 3   Entered and Authorized by:   Lars Mage MD   Signed by:   Lars Mage MD on 07/16/2009   Method used:   Electronically to        [facility] (retail)       [address]       [place], [place]  [address]       Ph: [phone]       Fax: [phone]   RxID:   [id number] PREDNISONE 5 MG TABS (PREDNISONE) .take 3 tabs with meal every day for next 1 month and then decrease to 2 tabs a day untill you see MD.  #90 x 1   Entered and Authorized by:   Lars Mage MD   Signed by:   Lars Mage MD on 07/16/2009   Method used:   Electronically to        [facility] (retail)       [address]       [place], [place]  [address]       Ph: [phone]       Fax: [phone]   RxID:   [id number]  Process Orders Check Orders Results:     [facility]: Check successful Tests Sent for requisitioning (July 16, 2009 10:51 PM):     07/16/2009: [facility] -- T-Comprehensive Metabolic Panel [[id number]] (signed)     07/16/2009: [facility] -- T-CBC w/Diff [[id number]] (signed)     07/16/2009: [facility] -- T-Lipase [id number] (signed)   Prevention &  Chronic Care Immunizations   Influenza vaccine: Not documented   Influenza vaccine  deferral: Refused  (06/01/2009)    Tetanus booster: Not documented   Td booster deferral: Deferred  (06/01/2009)    Pneumococcal vaccine: Not documented  Colorectal Screening   Hemoccult: Not documented    Colonoscopy: Not documented  Other Screening   Pap smear: Normal  (03/29/2006)    Mammogram: BI-RADS CATEGORY 3:  Probably benign finding(s) - short interval^MM DIGITAL DIAG LTD R  (06/04/2008)   Smoking status: never  (07/16/2009)  Diabetes Mellitus   HgbA1C: 7.0  (06/01/2009)    Eye exam: No diabetic retinopathy.     (03/26/2008)   Eye exam due: 04/2009    Foot exam: yes  (07/21/2008)   High risk foot: Not documented   Foot care education: Not documented    Urine microalbumin/creatinine ratio: 5.7  (01/16/2009)  Lipids   Total Cholesterol: 176  (01/16/2009)   LDL: 80  (01/16/2009)   LDL Direct: Not documented   HDL: 50  (01/16/2009)   Triglycerides: 232  (01/16/2009)  Hypertension   Last Blood Pressure: 123 / 81  (07/16/2009)   Serum creatinine: 0.65  (02/28/2009)   Serum potassium 4.6  (02/28/2009) CMP ordered   Self-Management Support :    Diabetes self-management support: Copy of home glucose meter record  (06/01/2009)   Last diabetes self-management training by diabetes educator: 10/02/2008   Last medical nutrition therapy: 06/09/2008    Hypertension self-management support: Not documented

## 2010-06-10 NOTE — Assessment & Plan Note (Signed)
Summary: EST-CK/FU/MEDS/CFB   Vital Signs:  Patient profile:   55 year old female Height:      64 inches Weight:      215.5 pounds BMI:     37.12 Temp:     98.4 degrees F oral Pulse rate:   98 / minute BP sitting:   132 / 80  (right arm)  Vitals Entered By: Filomena Jungling NT II (June 01, 2009 3:38 PM) CC: check-up / need refill/ throat sore Is Patient Diabetic? Yes Did you bring your meter with you today? Yes Nutritional Status BMI of > 30 = obese CBG Result 104  Have you ever been in a relationship where you felt threatened, hurt or afraid?No   Does patient need assistance? Functional Status Self care Ambulation Impaired:Risk for fall Comments has a crutch   Primary Care Provider:  Blondell Reveal MD  CC:  check-up / need refill/ throat sore.  History of Present Illness: 55 yr old AAF with PMH as mentioned in the EMR comes to the office for a regular office visit.  Reports compliancy to her medications and she denies any other complaints during this office visit.  Preventive Screening-Counseling & Management  Alcohol-Tobacco     Alcohol type: AT TIMES/ HOLIDAYS  / WINE     Smoking Status: never     Passive Smoke Exposure: no  Caffeine-Diet-Exercise     Does Patient Exercise: yes     Type of exercise: WALK TREADMILL     Times/week: 2  Problems Prior to Update: 1)  Anemia  (ICD-285.9) 2)  Gastroesophageal Reflux Disease  (ICD-530.81) 3)  Knee Pain, Left  (ICD-719.46) 4)  Ankle Sprain, Left  (ICD-845.00) 5)  Abnormal Mammogram, Right Breast  (ICD-793.80) 6)  Uti  (ICD-599.0) 7)  Epigastric Pain  (ICD-789.06) 8)  Dm  (ICD-250.00) 9)  Temporal Arteritis  (ICD-446.5) 10)  Transaminases, Serum, Elevated  (ICD-790.4) 11)  Hyponatremia  (ICD-276.1) 12)  Hyperglycemia  (ICD-790.29) 13)  Shoulder Pain, Left, Chronic  (ICD-719.41) 14)  Headache  (ICD-784.0) 15)  Weakness  (ICD-780.79) 16)  Dysuria  (ICD-788.1) 17)  Symptom, Rash, Oth Nonspecific Skin Eruption   (ICD-782.1) 18)  Frostbite  (ICD-991.3) 19)  Obesity Nos  (ICD-278.00) 20)  Hypertension  (ICD-401.9) 21)  Panic Disorder With Agoraphobia  (ICD-300.21)  Medications Prior to Update: 1)  Metoprolol Tartrate 50 Mg Tabs (Metoprolol Tartrate) .... Take 1 Tablet By Mouth Two Times A Day 2)  Hydrocodone-Acetaminophen 10-325 Mg  Tabs (Hydrocodone-Acetaminophen) .... Take One Tablet 2x Daily As Needed For Pain 3)  Amitriptyline Hcl 100 Mg  Tabs (Amitriptyline Hcl) .... Take 2 Tablets By Mouth At Bedtime 4)  Flexeril 10 Mg Tabs (Cyclobenzaprine Hcl) .... Take One Tablet By Mouth Every 8 Hours As Needed For Muscle Cramps 5)  Methadone Hcl 10 Mg Tabs (Methadone Hcl) .... Take 1 Tablet By Mouth 2x Times A Day 6)  Prednisone 20 Mg  Tabs (Prednisone) .... Take 1 Tablet By Mouth Once A Day 7)  Metformin Hcl 1000 Mg  Tabs (Metformin Hcl) .... Take 1 Tablet, Daily Twice, By Mouth 8)  Glipizide 5 Mg  Tabs (Glipizide) .... Take 2 Tablets By Mouth Once A Day 9)  Oscal 500/200 D-3 500-200 Mg-Unit  Tabs (Calcium-Vitamin D) .... Take 1 Tablet, Three Times Daily, By Mouth. 10)  Relion N 100 Unit/ml  Susp (Insulin Isophane Human) .... Inject 42 Units Subcutaneous in The Morning and 25 Units At Bedtime Daily 11)  Relion R 100 Unit/ml Soln (Insulin  Regular Human) .... Inject Into The Skin of Your Abdomen Using 2:50 Correction Scale Plus 1:15 Food Scale As Instructed 3 Times A Day 30 Mins Before Meals. 12)  Lisinopril 10 Mg Tabs (Lisinopril) .... Take 1 Tablet By Mouth Once A Day 13)  Insulin Syringe 30g X 1/2" 1 Ml Misc (Insulin Syringe-Needle U-100) .... Use To Inject Insulin 4x Daily 14)  Freestyle Lite Test  Strp (Glucose Blood) .... Use To Checj Your Blood Sugar 3-4x Daily 15)  Lancets  Misc (Lancets) .... Use To Check Your Blood Sugar 3-4x Daily 16)  Fosamax 35 Mg Tabs (Alendronate Sodium) .... Take 1 Tablet By Mouth Every Week. Avoid Lying Down For 30 Min. Drink With 1 [employer]. 17)  Omeprazole 20 Mg Tbec  (Omeprazole) .... Take 2 Tablets By Mouth in The Morning Before Breakfast 18)  Reglan 5 Mg Tabs (Metoclopramide Hcl) .Marland Kitchen.. 1 Tablet By Mouth Before Meals and At Bedtime.  Allergies (verified): 1)  ! Percocet 2)  ! Darvocet 3)  ! * Citric Acid  Past History:  Family History: Last updated: 12/07/07 Father died in a road accident at 78. Mother- HTN.  Risk Factors: Exercise: yes (06/01/2009)  Past Medical History: Reviewed history from 12/13/2007 and no changes required. Panic disorder with agoraphobia Hypertension Frostbite of left breast, hx of (7/07) Dysfunctional uterine bleeding, hx of, s/p hysterectomy Torn tendon in L shoulder R shoulder pain - bone cracked but went into the muscle.  Broke ring toe in 2001/2002 Temporal arteritis 05/09. DM/Hyperglycemia - 06/09. Severe cystitis post vaginal hysterectomy in 2003, s/p ABx bladder instillation X2 by urology  Past Surgical History: Reviewed history from 12/19/2007 and no changes required. Hysterectomy Oophorectomy. Temporal artery biopsy on 10/11/2007. Cystoscpy and hydraulic bladder distension.  Family History: Reviewed history from 2007/12/07 and no changes required. Father died in a road accident at 40. Mother- HTN.  Social History: Reviewed history from 12/15/2006 and no changes required. Pt's phone number is [phone]  Review of Systems      See HPI  Physical Exam  General:  overweight-appearing, NAD Mouth:  Oral mucosa and oropharynx without lesions or exudates.  Teeth in good repair. Neck:  supple.   Lungs:  Normal respiratory effort, chest expands symmetrically. Lungs are clear to auscultation, no crackles or wheezes. Heart:  Normal rate and regular rhythm. S1 and S2 normal without gallop, murmur, click, rub or other extra sounds. Abdomen:  soft.  decrease bowel sounds. TTP diffusely, no rebound, no rigidity, no guarding Msk:  no joint tenderness, no joint swelling, and no joint warmth.   Pulses:   R radial normal.   Extremities:  no edema Neurologic:  alert & oriented X3 and strength normal in all extremities.     Impression & Recommendations:  Problem # 1:  HYPERTENSION (ICD-401.9)  Well controlled. Cont current regimen.  BP today: 132/80 Prior BP: 132/84 (02/27/2009)  Labs Reviewed: K+: 4.6 (02/28/2009) Creat: : 0.65 (02/28/2009)   Chol: 176 (01/16/2009)   HDL: 50 (01/16/2009)   LDL: 80 (01/16/2009)   TG: 232 (01/16/2009)  The following medications were removed from the medication list:    Metoprolol Tartrate 50 Mg Tabs (Metoprolol tartrate) .Marland Kitchen... Take 1 tablet by mouth two times a day Her updated medication list for this problem includes:    Lisinopril 10 Mg Tabs (Lisinopril) .Marland Kitchen... Take 1 tablet by mouth once a day    Toprol Xl 50 Mg Xr24h-tab (Metoprolol succinate) .Marland Kitchen... Take 1 tablet by mouth once a day  Problem #  2:  DM (ICD-250.00)  A1C is trending down. Looking at the CBG's log will increase her AM dose of Relion N to 45 and keep on 25 units at night. Orders: T- Capillary Blood Glucose (82948) T-Hgb A1C (in-house) ([id number]) Orders: T- Capillary Blood Glucose (81191) T-Hgb A1C (in-house) ([id number])  Her updated medication list for this problem includes:    Metformin Hcl 1000 Mg Tabs (Metformin hcl) .Marland Kitchen... Take 1 tablet, daily twice, by mouth    Glipizide 5 Mg Tabs (Glipizide) .Marland Kitchen... Take 2 tablets by mouth once a day    Relion N 100 Unit/ml Susp (Insulin isophane human) ..... Inject 45 units subcutaneous in the morning and 25 units at bedtime daily    Relion R 100 Unit/ml Soln (Insulin regular human) ..... Inject into the skin of your abdomen using 2:50 correction scale plus 1:15 food scale as instructed 3 times a day 30 mins before meals.    Lisinopril 10 Mg Tabs (Lisinopril) .Marland Kitchen... Take 1 tablet by mouth once a day  Problem # 3:  TEMPORAL ARTERITIS (ICD-446.5) Patient continues to have headaches with prednione tapering. I really doubt if we ever able to come off  of her prednisone. Patient understands the risks of long term prednisone therapy.  Complete Medication List: 1)  Hydrocodone-acetaminophen 10-325 Mg Tabs (Hydrocodone-acetaminophen) .... Take one tablet 2x daily as needed for pain 2)  Amitriptyline Hcl 100 Mg Tabs (Amitriptyline hcl) .... Take 2 tablets by mouth at bedtime 3)  Flexeril 10 Mg Tabs (Cyclobenzaprine hcl) .... Take one tablet by mouth every 8 hours as needed for muscle cramps 4)  Methadone Hcl 10 Mg Tabs (Methadone hcl) .... Take 1 tablet by mouth 2x times a day 5)  Prednisone 20 Mg Tabs (Prednisone) .... Take 1 tablet by mouth once a day 6)  Metformin Hcl 1000 Mg Tabs (Metformin hcl) .... Take 1 tablet, daily twice, by mouth 7)  Glipizide 5 Mg Tabs (Glipizide) .... Take 2 tablets by mouth once a day 8)  Oscal 500/200 D-3 500-200 Mg-unit Tabs (Calcium-vitamin d) .... Take 1 tablet, three times daily, by mouth. 9)  Relion N 100 Unit/ml Susp (Insulin isophane human) .... Inject 45 units subcutaneous in the morning and 25 units at bedtime daily 10)  Relion R 100 Unit/ml Soln (Insulin regular human) .... Inject into the skin of your abdomen using 2:50 correction scale plus 1:15 food scale as instructed 3 times a day 30 mins before meals. 11)  Lisinopril 10 Mg Tabs (Lisinopril) .... Take 1 tablet by mouth once a day 12)  Insulin Syringe 30g X 1/2" 1 Ml Misc (Insulin syringe-needle u-100) .... Use to inject insulin 4x daily 13)  Freestyle Lite Test Strp (Glucose blood) .... Use to checj your blood sugar 3-4x daily 14)  Lancets Misc (Lancets) .... Use to check your blood sugar 3-4x daily 15)  Fosamax 35 Mg Tabs (Alendronate sodium) .... Take 1 tablet by mouth every week. avoid lying down for 30 min. drink with 1 plenty water. 16)  Omeprazole 20 Mg Tbec (Omeprazole) .... Take 2 tablets by mouth in the morning before breakfast 17)  Reglan 5 Mg Tabs (Metoclopramide hcl) .Marland Kitchen.. 1 tablet by mouth before meals and at bedtime. 18)  Toprol Xl 50 Mg  Xr24h-tab (Metoprolol succinate) .... Take 1 tablet by mouth once a day  Patient Instructions: 1)  Please schedule a follow-up appointment in 3 months. 2)  Take all the medications as advised below. 3)  Check your blood sugars regularly. If your  readings are usually above : 200 or below 70 you should contact our office. Prescriptions: TOPROL XL 50 MG XR24H-TAB (METOPROLOL SUCCINATE) Take 1 tablet by mouth once a day  #30 x 5   Entered and Authorized by:   Blondell Reveal MD   Signed by:   Blondell Reveal MD on 06/01/2009   Method used:   Print then Give to Patient   RxID:   [id number] LISINOPRIL 10 MG TABS (LISINOPRIL) Take 1 tablet by mouth once a day  #30 x 11   Entered and Authorized by:   Blondell Reveal MD   Signed by:   Blondell Reveal MD on 06/01/2009   Method used:   Print then Give to Patient   RxID:   [id number] PREDNISONE 20 MG  TABS (PREDNISONE) Take 1 tablet by mouth once a day  #30 x 0   Entered and Authorized by:   Blondell Reveal MD   Signed by:   Blondell Reveal MD on 06/01/2009   Method used:   Print then Give to Patient   RxID:   [id number] PREDNISONE 20 MG  TABS (PREDNISONE) Take 1 tablet by mouth once a day  #30 x 0   Entered and Authorized by:   Blondell Reveal MD   Signed by:   Blondell Reveal MD on 06/01/2009   Method used:   Print then Give to Patient   RxID:   [id number] LISINOPRIL 10 MG TABS (LISINOPRIL) Take 1 tablet by mouth once a day  #30 x 11   Entered and Authorized by:   Blondell Reveal MD   Signed by:   Blondell Reveal MD on 06/01/2009   Method used:   Print then Give to Patient   RxID:   [id number] TOPROL XL 50 MG XR24H-TAB (METOPROLOL SUCCINATE) Take 1 tablet by mouth once a day  #30 x 5   Entered and Authorized by:   Blondell Reveal MD   Signed by:   Blondell Reveal MD on 06/01/2009   Method used:   Print then Give to Patient   RxID:   [id number]     Prevention & Chronic Care Immunizations   Influenza vaccine: Not  documented   Influenza vaccine deferral: Refused  (06/01/2009)    Tetanus booster: Not documented   Td booster deferral: Deferred  (06/01/2009)    Pneumococcal vaccine: Not documented  Colorectal Screening   Hemoccult: Not documented    Colonoscopy: Not documented  Other Screening   Pap smear: Normal  (03/29/2006)    Mammogram: BI-RADS CATEGORY 3:  Probably benign finding(s) - short interval^MM DIGITAL DIAG LTD R  (06/04/2008)   Smoking status: never  (06/01/2009)  Diabetes Mellitus   HgbA1C: 7.0  (06/01/2009)    Eye exam: No diabetic retinopathy.     (03/26/2008)   Eye exam due: 04/2009    Foot exam: yes  (07/21/2008)   High risk foot: Not documented   Foot care education: Not documented    Urine microalbumin/creatinine ratio: 5.7  (01/16/2009)  Lipids   Total Cholesterol: 176  (01/16/2009)   LDL: 80  (01/16/2009)   LDL Direct: Not documented   HDL: 50  (01/16/2009)   Triglycerides: 232  (01/16/2009)  Hypertension   Last Blood Pressure: 132 / 80  (06/01/2009)   Serum creatinine: 0.65  (02/28/2009)   Serum potassium 4.6  (02/28/2009)  Self-Management Support :    Patient will work on the following items until the next clinic visit to reach self-care goals:     Medications  and monitoring: take my medicines every day, check my blood sugar, bring all of my medications to every visit, examine my feet every day  (06/01/2009)     Eating: drink diet soda or water instead of juice or soda, eat more vegetables, use fresh or frozen vegetables, eat foods that are low in salt, eat baked foods instead of fried foods  (06/01/2009)     Activity: take a 30 minute walk every day  (06/01/2009)    Diabetes self-management support: Copy of home glucose meter record  (06/01/2009)   Last diabetes self-management training by diabetes educator: 10/02/2008   Last medical nutrition therapy: 06/09/2008    Hypertension self-management support: Not documented  Laboratory Results    Blood Tests   Date/Time Received: June 01, 2009 4:03 PM Date/Time Reported: Alric Quan  June 01, 2009 4:03 PM  HGBA1C: 7.0%   (Normal Range: Non-Diabetic - 3-6%   Control Diabetic - 6-8%) CBG Random:: 104mg /dL      Impression & Recommendations:  The following medications were removed from the medication list:    Metoprolol Tartrate 50 Mg Tabs (Metoprolol tartrate) .Marland Kitchen... Take 1 tablet by mouth two times a day  Her updated medication list for this problem includes:    Lisinopril 10 Mg Tabs (Lisinopril) .Marland Kitchen... Take 1 tablet by mouth once a day    Toprol Xl 50 Mg Xr24h-tab (Metoprolol succinate) .Marland Kitchen... Take 1 tablet by mouth once a day The following medications were removed from the medication list:    Metoprolol Tartrate 50 Mg Tabs (Metoprolol tartrate) .Marland Kitchen... Take 1 tablet by mouth two times a day Her updated medication list for this problem includes:    Lisinopril 10 Mg Tabs (Lisinopril) .Marland Kitchen... Take 1 tablet by mouth once a day    Toprol Xl 50 Mg Xr24h-tab (Metoprolol succinate) .Marland Kitchen... Take 1 tablet by mouth once a day  Her updated medication list for this problem includes:    Metformin Hcl 1000 Mg Tabs (Metformin hcl) .Marland Kitchen... Take 1 tablet, daily twice, by mouth    Glipizide 5 Mg Tabs (Glipizide) .Marland Kitchen... Take 2 tablets by mouth once a day    Relion N 100 Unit/ml Susp (Insulin isophane human) ..... Inject 45 units subcutaneous in the morning and 25 units at bedtime daily    Relion R 100 Unit/ml Soln (Insulin regular human) ..... Inject into the skin of your abdomen using 2:50 correction scale plus 1:15 food scale as instructed 3 times a day 30 mins before meals.    Lisinopril 10 Mg Tabs (Lisinopril) .Marland Kitchen... Take 1 tablet by mouth once a day  Her updated medication list for this problem includes:    Metformin Hcl 1000 Mg Tabs (Metformin hcl) .Marland Kitchen... Take 1 tablet, daily twice, by mouth    Glipizide 5 Mg Tabs (Glipizide) .Marland Kitchen... Take 2 tablets by mouth once a day    Relion N 100 Unit/ml  Susp (Insulin isophane human) ..... Inject 45 units subcutaneous in the morning and 25 units at bedtime daily    Relion R 100 Unit/ml Soln (Insulin regular human) ..... Inject into the skin of your abdomen using 2:50 correction scale plus 1:15 food scale as instructed 3 times a day 30 mins before meals.    Lisinopril 10 Mg Tabs (Lisinopril) .Marland Kitchen... Take 1 tablet by mouth once a day  Her updated medication list for this problem includes:    Metformin Hcl 1000 Mg Tabs (Metformin hcl) .Marland Kitchen... Take 1 tablet, daily twice, by mouth    Glipizide 5 Mg  Tabs (Glipizide) .Marland Kitchen... Take 2 tablets by mouth once a day    Relion N 100 Unit/ml Susp (Insulin isophane human) ..... Inject 45 units subcutaneous in the morning and 25 units at bedtime daily    Relion R 100 Unit/ml Soln (Insulin regular human) ..... Inject into the skin of your abdomen using 2:50 correction scale plus 1:15 food scale as instructed 3 times a day 30 mins before meals.    Lisinopril 10 Mg Tabs (Lisinopril) .Marland Kitchen... Take 1 tablet by mouth once a day  Orders: T- Capillary Blood Glucose (82948) T-Hgb A1C (in-house) ([id number])   Complete Medication List: 1)  Hydrocodone-acetaminophen 10-325 Mg Tabs (Hydrocodone-acetaminophen) .... Take one tablet 2x daily as needed for pain 2)  Amitriptyline Hcl 100 Mg Tabs (Amitriptyline hcl) .... Take 2 tablets by mouth at bedtime 3)  Flexeril 10 Mg Tabs (Cyclobenzaprine hcl) .... Take one tablet by mouth every 8 hours as needed for muscle cramps 4)  Methadone Hcl 10 Mg Tabs (Methadone hcl) .... Take 1 tablet by mouth 2x times a day 5)  Prednisone 20 Mg Tabs (Prednisone) .... Take 1 tablet by mouth once a day 6)  Metformin Hcl 1000 Mg Tabs (Metformin hcl) .... Take 1 tablet, daily twice, by mouth 7)  Glipizide 5 Mg Tabs (Glipizide) .... Take 2 tablets by mouth once a day 8)  Oscal 500/200 D-3 500-200 Mg-unit Tabs (Calcium-vitamin d) .... Take 1 tablet, three times daily, by mouth. 9)  Relion N 100 Unit/ml Susp  (Insulin isophane human) .... Inject 45 units subcutaneous in the morning and 25 units at bedtime daily 10)  Relion R 100 Unit/ml Soln (Insulin regular human) .... Inject into the skin of your abdomen using 2:50 correction scale plus 1:15 food scale as instructed 3 times a day 30 mins before meals. 11)  Lisinopril 10 Mg Tabs (Lisinopril) .... Take 1 tablet by mouth once a day 12)  Insulin Syringe 30g X 1/2" 1 Ml Misc (Insulin syringe-needle u-100) .... Use to inject insulin 4x daily 13)  Freestyle Lite Test Strp (Glucose blood) .... Use to checj your blood sugar 3-4x daily 14)  Lancets Misc (Lancets) .... Use to check your blood sugar 3-4x daily 15)  Fosamax 35 Mg Tabs (Alendronate sodium) .... Take 1 tablet by mouth every week. avoid lying down for 30 min. drink with 1 plenty water. 16)  Omeprazole 20 Mg Tbec (Omeprazole) .... Take 2 tablets by mouth in the morning before breakfast 17)  Reglan 5 Mg Tabs (Metoclopramide hcl) .Marland Kitchen.. 1 tablet by mouth before meals and at bedtime. 18)  Toprol Xl 50 Mg Xr24h-tab (Metoprolol succinate) .... Take 1 tablet by mouth once a day

## 2010-06-10 NOTE — Progress Notes (Signed)
  Phone Note Outgoing Call   Call placed by: Theotis Barrio NT II,  March 16, 2010 9:19 AM Call placed to: Specialist Details for Reason: [facility] PULMONARY Summary of Call: T[facility] Gens OFFICE [phone]- DORA) LEFT VOICE MESSAGE FOR MS DORA TO GIVE A CALL BACK TO GET THIS PATIENT AN APPT.

## 2010-06-10 NOTE — Miscellaneous (Signed)
  Clinical Lists Changes  Medications: Added new medication of SIMVASTATIN 20 MG TABS (SIMVASTATIN) 1 tab every evening prior to bedtime - Signed Rx of SIMVASTATIN 20 MG TABS (SIMVASTATIN) 1 tab every evening prior to bedtime;  #30 x 4;  Signed;  Entered by: Layne Benton, RN, BSN;  Authorized by: Sherrill Raring, MD, [facility];  Method used: Electronically to [facility] [address] [place], [place], [place]  [address], Ph: [phone], Fax: [phone]    Prescriptions: SIMVASTATIN 20 MG TABS (SIMVASTATIN) 1 tab every evening prior to bedtime  #30 x 4   Entered by:   Layne Benton, RN, BSN   Authorized by:   Sherrill Raring, MD, [facility]   Signed by:   Layne Benton, RN, BSN on 02/15/2010   Method used:   Electronically to        [facility] (retail)       [address]       [place], [place]  [address]       Ph: [phone]       Fax: [phone]   RxID:   [id number]

## 2010-06-10 NOTE — Progress Notes (Signed)
  Phone Note Outgoing Call   Call placed by: Theotis Barrio NT II,  March 30, 2010 11:50 AM Call placed to: Patient Details for Reason: RHEUMATOLOGY APPT Summary of Call: CALLED PATIENT AND LVM FOR PATIET TO CALL [facility] FOR MORE INFO.  APPT IS FOR DEC. 14, 011 AT 3:30PM / RHEUMATOLOGY AT [facility]. [address] [phone]    FAX JODIE- [phone]. LELA

## 2010-06-10 NOTE — Progress Notes (Signed)
  Phone Note Outgoing Call Call back at [facility] Phone [phone]   Call placed by: [facility] NT II,  Sep 25, 2009 1:47 PM Call placed to: Patient Action Taken: Appt scheduled Request: Send information Details for Reason: cardiology appointment Summary of Call: I HAVE MADE 2  PHONE  CALLS TO PATIENTS HOME PHONE NUMBER, THE PATIENT HAS APPOINTMENT WITH [facility] ON MONDAY AT 3 PM. I HAVE LEFT 2 MESSAGES FOR PATIENT, IF PATIENT CAN NOT GO SHE SHOULD CALL AND RESCHEDULE. PHONE NUMBER IS  [phone]. Initial call taken by: Filomena Jungling NT II,  Sep 25, 2009 1:49 PM  Follow-up for Phone Call        OK. She is good at follow ups and I did mention this when I talked to her yesterday. Follow-up by: Blondell Reveal MD,  Sep 25, 2009 2:08 PM

## 2010-06-10 NOTE — Assessment & Plan Note (Signed)
Summary: EST-CK/FU/MEDS/CFB   Vital Signs:  Patient profile:   55 year old female Height:      64 inches (162.56 cm) Weight:      208.1 pounds (96.59 kg) BMI:     36.61 Temp:     98.1 degrees F (36.72 degrees C) oral Pulse rate:   104 / minute BP sitting:   132 / 79  (right arm) Cuff size:   large  Vitals Entered By: Theotis Barrio NT II (Sep 08, 2009 3:27 PM) CC: LEFT ANKLE AND LOWER ABD PAIN   / ANKLE #6  - AB #8 PAIN LEVE  / REQUEST RX FOR OINTMENT / LOWER JAW PAIN TO CALL DENTST Is Patient Diabetic? Yes Did you bring your meter with you today? YES / SYSTEM DOWN Nutritional Status BMI of > 30 = obese CBG Result 143  Have you ever been in a relationship where you felt threatened, hurt or afraid?No   Does patient need assistance? Functional Status Self care Ambulation Normal Comments LOWER JAW PAIN TO CALL HER DENTIST / REQUEST RX FOR OINTMENT / LEFT ANKLE AND LOWER ABD PAIN / MEDICATION REFILL.   Primary Care Provider:  Blondell Reveal MD  CC:  LEFT ANKLE AND LOWER ABD PAIN   / ANKLE #6  - AB #8 PAIN LEVE  / REQUEST RX FOR OINTMENT / LOWER JAW PAIN TO CALL DENTST.  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 Patient is scheduled for left ankle surgery on may 18th with Dr. Lestine Box. Patient was cleared for the surgery by [facility] in her last office visit.  2. C/O Teeth pain on left lower mandible and reports that the cap came off. Patient called dentist office and it seems her dentist is out of town. She has been using the peroxide. Patient also c/o left ear ache after the tooth ache started. Patient is taking her regular pain medicines.  Denies any fevers, chills or body pains.   Preventive Screening-Counseling &  Management  Alcohol-Tobacco     Alcohol type: AT TIMES/ HOLIDAYS  / WINE     Smoking Status: never     Passive Smoke Exposure: no  Caffeine-Diet-Exercise     Does Patient Exercise: no     Type of exercise: WALK TREADMILL     Times/week: 2  Problems Prior to Update: 1)  Preoperative Examination  (ICD-V72.84) 2)  Anemia  (ICD-285.9) 3)  Gastroesophageal Reflux Disease  (ICD-530.81) 4)  Knee Pain, Left  (ICD-719.46) 5)  Ankle Sprain, Left  (ICD-845.00) 6)  Abnormal Mammogram, Right Breast  (ICD-793.80) 7)  Epigastric Pain  (ICD-789.06) 8)  Dm  (ICD-250.00) 9)  Temporal Arteritis  (ICD-446.5) 10)  Transaminases, Serum, Elevated  (ICD-790.4) 11)  Hyponatremia  (ICD-276.1) 12)  Hyperglycemia  (ICD-790.29) 13)  Shoulder Pain, Left, Chronic  (ICD-719.41) 14)  Headache  (ICD-784.0) 15)  Weakness  (ICD-780.79) 16)  Dysuria  (ICD-788.1) 17)  Symptom, Rash, Oth Nonspecific Skin Eruption  (ICD-782.1) 18)  Frostbite  (ICD-991.3) 19)  Obesity Nos  (ICD-278.00) 20)  Hypertension  (ICD-401.9) 21)  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 Take One Tablet 2x Daily As Needed For Pain 2)  Flexeril 10 Mg Tabs (Cyclobenzaprine Hcl) .... Take One Tablet By Mouth Every 8 Hours As Needed For Muscle Cramps 3)  Methadone Hcl 10 Mg Tabs (Methadone Hcl) .... Take 1 Tablet By Mouth 2x Times A Day 4)  Metformin Hcl 1000 Mg  Tabs (Metformin Hcl) .... Take 1 Tablet, Daily Twice, By Mouth 5)  Glipizide 5 Mg  Tabs (Glipizide) .... Take 2 Tablets By Mouth Once A Day 6)  Oscal 500/200 D-3 500-200 Mg-Unit  Tabs (Calcium-Vitamin D) .... Take 1 Tablet, Three Times Daily, By Mouth. 7)  Relion N 100 Unit/ml  Susp (Insulin Isophane Human) .... Inject 45 Units Subcutaneous in The Morning and 25 Units At Bedtime Daily 8)  Relion R 100 Unit/ml Soln (Insulin Regular Human) .... Inject Into The Skin of Your  Abdomen Using 2:50 Correction Scale Plus 1:15 Food Scale As Instructed 3 Times A Day 30 Mins Before Meals. 9)  Lisinopril 10 Mg Tabs (Lisinopril) .... Take 1 Tablet By Mouth Once A Day 10)  Insulin Syringe 30g X 1/2" 1 Ml Misc (Insulin Syringe-Needle U-100) .... Use To Inject Insulin 4x Daily 11)  Freestyle Lite Test  Strp (Glucose Blood) .... Use To Checj Your Blood Sugar 3-4x Daily 12)  Lancets  Misc (Lancets) .... Use To Check Your Blood Sugar 3-4x Daily 13)  Fosamax 35 Mg Tabs (Alendronate Sodium) .... Take 1 Tablet By Mouth Every Week. Avoid Lying Down For 30 Min. Drink With 1 [employer]. 14)  Omeprazole 20 Mg Tbec (Omeprazole) .... Take 2 Tablets By Mouth in The Morning Before Breakfast 15)  Reglan 5 Mg Tabs (Metoclopramide Hcl) .Marland Kitchen.. 1 Tablet By Mouth Before Meals and At Bedtime. 16)  Toprol Xl 50 Mg Xr24h-Tab (Metoprolol Succinate) .... Take 1 Tablet By Mouth Once A Day 17)  Prednisone 5 Mg Tabs (Prednisone) .... .take 3 Tabs With Meal Every Day For Next 1 Month and Then Decrease To 2 Tabs A Day Untill You See Md. 18)  Promethazine Hcl 25 Mg Tabs (Promethazine Hcl) .... Take 1 Tab As Needed For Nausea. 19)  Citalopram Hydrobromide 40 Mg Tabs (Citalopram Hydrobromide) .... Take One Half of A Pill By Mouth For The First 7 Days, Then Increase To One Full Pill By Mouth Every Day 20)  Lorazepam 0.5 Mg Tabs (Lorazepam) .... Take One Pill As Needed For Panic Attack  Current Medications (verified): 1)  Hydrocodone-Acetaminophen 10-325 Mg  Tabs (Hydrocodone-Acetaminophen) .... Take One Tablet 2x Daily As Needed For Pain 2)  Flexeril 10 Mg Tabs (Cyclobenzaprine Hcl) .... Take One Tablet By Mouth Every 8 Hours As Needed For Muscle Cramps 3)  Methadone Hcl 10 Mg Tabs (Methadone Hcl) .... Take 1 Tablet By Mouth 2x Times A Day 4)  Metformin Hcl 1000 Mg  Tabs (Metformin Hcl) .... Take 1 Tablet, Daily Twice, By Mouth 5)  Glipizide 5 Mg  Tabs (Glipizide) .... Take 2 Tablets By Mouth Once A Day 6)  Oscal  500/200 D-3 500-200 Mg-Unit  Tabs (Calcium-Vitamin D) .... Take 1 Tablet, Three Times Daily, By Mouth. 7)  Relion N 100 Unit/ml  Susp (Insulin Isophane Human) .... Inject 42 Units Subcutaneous in The Morning and 26 Units At Bedtime Daily 8)  Relion R 100 Unit/ml Soln (Insulin Regular Human) .... Inject Into The Skin of Your Abdomen Using 2:50 Correction Scale Plus 1:15 Food Scale As Instructed 3 Times A Day 30 Mins Before Meals. 9)  Lisinopril 10 Mg Tabs (Lisinopril) .... Take 1 Tablet By Mouth Once A Day 10)  Insulin Syringe 30g X 1/2" 1 Ml Misc (Insulin Syringe-Needle U-100) .... Use To Inject Insulin 4x Daily 11)  Freestyle Lite Test  Strp (Glucose Blood) .... Use To Checj Your Blood Sugar 3-4x Daily 12)  Lancets  Misc (Lancets) .... Use To Check Your Blood Sugar 3-4x Daily 13)  Fosamax 35 Mg Tabs (Alendronate Sodium) .... Take 1 Tablet By Mouth Every Week. Avoid Lying Down For 30 Min. Drink With 1 [employer]. 14)  Omeprazole 20 Mg Tbec (Omeprazole) .... Take 2 Tablets By Mouth in The Morning Before Breakfast 15)  Reglan 5 Mg Tabs (Metoclopramide Hcl) .Marland Kitchen.. 1 Tablet By Mouth Before Meals and At Bedtime. 16)  Toprol Xl 50 Mg Xr24h-Tab (Metoprolol Succinate) .... Take 1 Tablet By Mouth Once A Day 17)  Prednisone 5 Mg Tabs (Prednisone) .... .take 3 Tabs With Meal Every Day For Next 1 Month and Then Decrease To 2 Tabs A Day Untill You See Md. 18)  Promethazine Hcl 25 Mg Tabs (Promethazine Hcl) .... Take 1 Tab As Needed For Nausea. 19)  Citalopram Hydrobromide 40 Mg Tabs (Citalopram Hydrobromide) .... Take One Half of A Pill By Mouth For The First 7 Days, Then Increase To One Full Pill By Mouth Every Day 20)  Lorazepam 0.5 Mg Tabs (Lorazepam) .... Take One Pill As Needed For Panic Attack  Allergies (verified): 1)  ! Percocet 2)  ! Darvocet 3)  ! * Citric Acid  Past History:  Family History: Last updated: 11-25-07 Father died in a road accident at 59. Mother- HTN.  Social History: Last  updated: 12/15/2006 Pt's phone number is [phone]  Risk Factors: Exercise: no (09/08/2009)  Risk Factors: Smoking Status: never (09/08/2009) Passive Smoke Exposure: no (09/08/2009)  Past Medical History: Reviewed history from 12/13/2007 and no changes required. Panic disorder with agoraphobia Hypertension Frostbite of left breast, hx of (7/07) Dysfunctional uterine bleeding, hx of, s/p hysterectomy Torn tendon in L shoulder R shoulder pain - bone cracked but went into the muscle.  Broke ring toe in 2001/2002 Temporal arteritis 05/09. DM/Hyperglycemia - 06/09. Severe cystitis post vaginal hysterectomy in 2003, s/p ABx bladder instillation X2 by urology  Past Surgical History: Reviewed history from 12/19/2007 and no changes required. Hysterectomy Oophorectomy. Temporal artery biopsy on 10/11/2007. Cystoscpy and hydraulic bladder distension.  Family History: Reviewed history from 2007-11-25 and no changes required. Father died in a road accident at 81. Mother- HTN.  Social History: Reviewed history from 12/15/2006 and no changes required. Pt's phone number is [phone]  Review of Systems      See HPI  Physical Exam  General:  Well-developed,well-nourished,in no acute distress; alert,appropriate and cooperative throughout examination Eyes:  vision grossly intact.   Ears:  External ear exam shows no significant lesions or deformities.  Otoscopic examination reveals clear canals, tympanic membranes are intact bilaterally without bulging, retraction, inflammation or discharge. Hearing is grossly normal bilaterally. Mouth:  pharynx pink and moist.  Infected teeth in the front aspect of mandible with the cap off. No visible pus. The adjascent mucous mebranes are erythematous and inflamed. Lungs:  Normal respiratory effort, chest expands symmetrically. Lungs are clear to auscultation, no crackles or wheezes. Heart:  Normal rate and regular rhythm. S1 and S2 normal  without gallop, murmur, click, rub or other extra sounds. Pulses:  R radial normal.   Extremities:  no edema Neurologic:  alert & oriented X3.     Impression & Recommendations:  Problem # 1:  KNEE PAIN, LEFT (ICD-719.46) Scheduled for surgery on  May 18th with Dr. Lestine Box.  Her updated medication list for this problem includes:    Hydrocodone-acetaminophen 10-325 Mg Tabs (Hydrocodone-acetaminophen) .Marland Kitchen... Take one tablet 2x daily as needed for pain    Flexeril 10 Mg Tabs (Cyclobenzaprine hcl) .Marland Kitchen... Take one tablet by mouth every 8 hours as needed for muscle cramps    Methadone Hcl 10 Mg Tabs (Methadone hcl) .Marland Kitchen... Take 1 tablet by mouth 2x times a day  Problem # 2:  DM (ICD-250.00) HbA1C well controlled and is <7.0. Continue the current regimen.  Her updated medication list for this problem includes:    Metformin Hcl 1000 Mg Tabs (Metformin hcl) .Marland Kitchen... Take 1 tablet, daily twice, by mouth    Glipizide 5 Mg Tabs (Glipizide) .Marland Kitchen... Take 2 tablets by mouth once a day    Relion N 100 Unit/ml Susp (Insulin isophane human) ..... Inject 42 units subcutaneous in the morning and 26 units at bedtime daily    Relion R 100 Unit/ml Soln (Insulin regular human) ..... Inject into the skin of your abdomen using 2:50 correction scale plus 1:15 food scale as instructed 3 times a day 30 mins before meals.    Lisinopril 10 Mg Tabs (Lisinopril) .Marland Kitchen... Take 1 tablet by mouth once a day  Labs Reviewed: Creat: 0.83 (07/16/2009)     Last Eye Exam: No diabetic retinopathy.    (03/26/2008) Reviewed HgBA1c results: 6.8 (08/19/2009)  7.0 (06/01/2009)  Problem # 3:  HYPERTENSION (ICD-401.9) Well controlled. Cont current regimen.  Her updated medication list for this problem includes:    Lisinopril 10 Mg Tabs (Lisinopril) .Marland Kitchen... Take 1 tablet by mouth once a day    Toprol Xl 50 Mg Xr24h-tab (Metoprolol succinate) .Marland Kitchen... Take 1 tablet by mouth once a day  BP today: 132/79 Prior BP: 144/86 (08/19/2009)  Labs  Reviewed: K+: 3.8 (07/16/2009) Creat: : 0.83 (07/16/2009)   Chol: 176 (01/16/2009)   HDL: 50 (01/16/2009)   LDL: 80 (01/16/2009)   TG: 232 (01/16/2009)  Problem # 4:  BROKEN TOOTH, INFECTED (ICD-873.73) With signs of infected teeth. Will start her on amoxicillin for 7 days.   Complete Medication List: 1)  Hydrocodone-acetaminophen 10-325 Mg Tabs (Hydrocodone-acetaminophen) .... Take one tablet 2x daily as needed for pain 2)  Flexeril 10 Mg Tabs (Cyclobenzaprine hcl) .... Take one tablet by mouth every 8 hours as needed for muscle cramps 3)  Methadone Hcl 10 Mg Tabs (Methadone hcl) .... Take 1 tablet by mouth 2x times a day 4)  Metformin Hcl 1000 Mg Tabs (Metformin hcl) .... Take 1 tablet, daily twice, by mouth 5)  Glipizide 5 Mg Tabs (Glipizide) .... Take 2 tablets by mouth once a day 6)  Oscal 500/200 D-3 500-200 Mg-unit Tabs (Calcium-vitamin d) .... Take 1 tablet, three times daily, by mouth. 7)  Relion N 100 Unit/ml Susp (Insulin isophane human) .... Inject 42 units subcutaneous in the morning and 26 units at bedtime daily 8)  Relion R 100 Unit/ml Soln (Insulin regular human) .... Inject into the skin of your abdomen using 2:50 correction scale plus 1:15 food scale as instructed 3 times a day 30 mins before meals. 9)  Lisinopril 10 Mg Tabs (Lisinopril) .... Take 1 tablet by mouth once a day 10)  Insulin Syringe 30g X 1/2" 1 Ml Misc (Insulin syringe-needle u-100) .... Use to inject insulin 4x daily 11)  Freestyle Lite Test Strp (Glucose blood) .... Use to checj your blood sugar 3-4x daily 12)  Lancets Misc (Lancets) .... Use to check your blood  sugar 3-4x daily 13)  Fosamax 35 Mg Tabs (Alendronate sodium) .... Take 1 tablet by mouth every week. avoid lying down for 30 min. drink with 1 plenty water. 14)  Omeprazole 20 Mg Tbec (Omeprazole) .... Take 2 tablets by mouth in the morning before breakfast 15)  Reglan 5 Mg Tabs (Metoclopramide hcl) .Marland Kitchen.. 1 tablet by mouth before meals and at  bedtime. 16)  Toprol Xl 50 Mg Xr24h-tab (Metoprolol succinate) .... Take 1 tablet by mouth once a day 17)  Prednisone 5 Mg Tabs (Prednisone) .... .take 3 tabs with meal every day for next 1 month and then decrease to 2 tabs a day untill you see md. 18)  Promethazine Hcl 25 Mg Tabs (Promethazine hcl) .... Take 1 tab as needed for nausea. 19)  Citalopram Hydrobromide 40 Mg Tabs (Citalopram hydrobromide) .... Take one half of a pill by mouth for the first 7 days, then increase to one full pill by mouth every day 20)  Lorazepam 0.5 Mg Tabs (Lorazepam) .... Take one pill as needed for panic attack 21)  Amoxicillin 500 Mg Caps (Amoxicillin) .... Take 1 tablet by mouth two times a day for 7 days.  Other Orders: Capillary Blood Glucose/CBG ([address])  Patient Instructions: 1)  Please schedule a follow-up appointment in 2 months. 2)  Take all the medications as mentioned below. 3)  Check your blood sugars regularly. If your readings are usually above : 300 or below 70 you should contact our office. Prescriptions: AMOXICILLIN 500 MG CAPS (AMOXICILLIN) Take 1 tablet by mouth two times a day for 7 days.  #14 x 0   Entered and Authorized by:   Blondell Reveal MD   Signed by:   Blondell Reveal MD on 09/08/2009   Method used:   Electronically to        [facility] (retail)       [address]       [place], [place]  [address]       Ph: [phone]       Fax: [phone]   RxID:   [id number]    Prevention & Chronic Care Immunizations   Influenza vaccine: Not documented   Influenza vaccine deferral: Refused  (06/01/2009)    Tetanus booster: Not documented   Td booster deferral: Deferred  (06/01/2009)    Pneumococcal vaccine: Not documented  Colorectal Screening   Hemoccult: Not documented    Colonoscopy: Not documented  Other Screening   Pap smear: Normal  (03/29/2006)    Mammogram: BI-RADS CATEGORY 3:  Probably benign finding(s) - short interval^MM DIGITAL  DIAG LTD R  (06/04/2008)   Smoking status: never  (09/08/2009)  Diabetes Mellitus   HgbA1C: 6.8  (08/19/2009)    Eye exam: No diabetic retinopathy.     (03/26/2008)   Eye exam due: 10/07/2009    Foot exam: yes  (07/21/2008)   Foot exam action/deferral: Do today   High risk foot: Not documented   Foot care education: Done  (08/19/2009)    Urine microalbumin/creatinine ratio: 5.7  (01/16/2009)   Urine microalbumin/cr due: 01/07/2010  Lipids   Total Cholesterol: 176  (01/16/2009)   LDL: 80  (01/16/2009)   LDL Direct: Not documented   HDL: 50  (01/16/2009)   Triglycerides: 232  (01/16/2009)   Lipid panel due: 01/07/2010  Hypertension   Last Blood Pressure: 132 / 79  (09/08/2009)   Serum creatinine: 0.83  (07/16/2009)   Serum potassium 3.8  (  07/16/2009)  Self-Management Support :   Personal Goals (by the next clinic visit) :     Personal A1C goal: 7  (08/19/2009)     Personal blood pressure goal: 130/80  (08/19/2009)     Personal LDL goal: 70  (08/19/2009)    Patient will work on the following items until the next clinic visit to reach self-care goals:     Medications and monitoring: take my medicines every day, check my blood sugar, bring all of my medications to every visit, examine my feet every day  (09/08/2009)     Eating: drink diet soda or water instead of juice or soda, eat more vegetables, use fresh or frozen vegetables, eat foods that are low in salt, eat baked foods instead of fried foods, eat fruit for snacks and desserts, limit or avoid alcohol  (09/08/2009)     Activity: take a 30 minute walk every day  (06/01/2009)    Diabetes self-management support: Education handout, Resources for patients handout, Written self-care plan  (08/19/2009)   Last diabetes self-management training by diabetes educator: 10/02/2008   Last medical nutrition therapy: 06/09/2008    Hypertension self-management support: Education handout, Resources for patients handout, Written  self-care plan  (08/19/2009)    Self-management comments: UNABLE TO USE TREADMILL AT THIS TIME ? SURGERYTO ANKLE

## 2010-06-10 NOTE — Progress Notes (Signed)
Summary: call for results/ hla  Phone Note Call from Patient   Summary of Call: pt calls to request xray results, she is given the message that was the append to visit and made aware she will need to keep appt w/ PT Initial call taken by: Marin Roberts RN,  November 27, 2009 2:57 PM

## 2010-06-10 NOTE — Assessment & Plan Note (Signed)
Summary: NP6/chest pains   Visit Type:  Initial Consult Primary Provider:  Blondell Reveal MD   History of Present Illness: Patient is a 55 year old who is referred as a preop eval. She has no known CAD.  She does get some SOB with activity, limited by ankle injury.  No chest pains Has history of DM for 2 1/2 years, glucose is up/down.  Current Medications (verified): 1)  Hydrocodone-Acetaminophen 10-325 Mg  Tabs (Hydrocodone-Acetaminophen) .... Take One Tablet 2x Daily As Needed For Pain 2)  Flexeril 10 Mg Tabs (Cyclobenzaprine Hcl) .... Take One Tablet By Mouth Every 8 Hours As Needed For Muscle Cramps 3)  Methadone Hcl 10 Mg Tabs (Methadone Hcl) .... Take 1 Tablet By Mouth 2x Times A Day 4)  Metformin Hcl 1000 Mg  Tabs (Metformin Hcl) .... Take 1 Tablet, Daily Twice, By Mouth 5)  Glipizide 5 Mg  Tabs (Glipizide) .... Take 2 Tablets By Mouth Once A Day 6)  Relion N 100 Unit/ml  Susp (Insulin Isophane Human) .... Inject 42 Units Subcutaneous in The Morning and 26 Units At Bedtime Daily 7)  Relion R 100 Unit/ml Soln (Insulin Regular Human) .... Inject Into The Skin of Your Abdomen Using 2:50 Correction Scale Plus 1:15 Food Scale As Instructed 3 Times A Day 30 Mins Before Meals. 8)  Insulin Syringe 30g X 1/2" 1 Ml Misc (Insulin Syringe-Needle U-100) .... Use To Inject Insulin 4x Daily 9)  Freestyle Lite Test  Strp (Glucose Blood) .... Use To Checj Your Blood Sugar 3-4x Daily 10)  Lancets  Misc (Lancets) .... Use To Check Your Blood Sugar 3-4x Daily 11)  Reglan 5 Mg Tabs (Metoclopramide Hcl) .Marland Kitchen.. 1 Tablet By Mouth Before Meals and At Bedtime. 12)  Toprol Xl 50 Mg Xr24h-Tab (Metoprolol Succinate) .... Take 1 Tablet By Mouth Once A Day 13)  Effexor Xr 75 Mg Xr24h-Cap (Venlafaxine Hcl) .... Take 1 Capsule By Mouth Tid 14)  Lorazepam 0.5 Mg Tabs (Lorazepam) .... Take One Pill As Needed For Panic Attack 15)  Prednisone 10 Mg Tabs (Prednisone) .Marland Kitchen.. 1tabqd 16)  0tc  Priolsec .... Daily  Allergies  (verified): 1)  ! Percocet 2)  ! Darvocet 3)  ! * Citric Acid  Past History:  Past Medical History: Panic disorder with agoraphobia Hypertension Frostbite of left breast, hx of (7/07) Dysfunctional uterine bleeding, hx of, s/p hysterectomy Torn tendon in L shoulder R shoulder pain - bone cracked but went into the muscle.  Broke ring toe in 2001/2002 Temporal arteritis 05/09. DM/Hyperglycemia - 06/09. Severe cystitis post vaginal hysterectomy in 2003, s/p ABx bladder instillation X2 by urology Gastritis  Past Surgical History: Hysterectomy Oophorectomy Temporal artery biopsy on 10/11/2007. Cystoscpy and hydraulic bladder distension. CHolecystectomy  Family History: Father died in a road accident at 80. Mother- HTN. Aunts with intracerebral aneurysms.  Social History: Pt's phone number is [phone] No tobacco Rare EtOH  Review of Systems       All systmes reviewed.  Neg to the above problem.  Vital Signs:  Patient profile:   55 year old female Height:      64 inches Weight:      216 pounds Pulse rate:   99 / minute BP sitting:   144 / 83  (left arm) Cuff size:   large  Vitals Entered By: Burnett Kanaris, CNA (January 04, 2010 10:12 AM)  Physical Exam  Additional Exam:  Patient is in NAD. HEENT:  Normocephalic, atraumatic. EOMI, PERRLA.  Neck: JVP is normal. No  thyromegaly. No bruits.  Lungs: clear to auscultation. No rales no wheezes.  Heart: Regular rate and rhythm. Normal S1, S2. No S3.   Gr I/VI systolic murmur L sternal border. PMI not displaced.  Abdomen:  Supple, nontender. Normal bowel sounds. No masses. No hepatomegaly.  Extremities:   Good distal pulses throughout. No lower extremity edema.  Musculoskeletal :moving all extremities.  Neuro:   alert and oriented x3.    EKG  Procedure date:  01/04/2010  Findings:      NSR  99  bpm.  Impression & Recommendations:  Problem # 1:  PREOPERATIVE EXAMINATION (ICD-V72.84) Patient does not have  chest pains.  Does have some SOB with actiivity.  With DM and limited activity I would recomm a myoview to rule out ischemia.  Will set up for fasting lipids with DM.  Problem # 2:  MURMUR (ICD-785.2) Soft, most likely TR..  With this and EKG showing LVH will schedule echo.  Problem # 3:  HYPERTENSION (ICD-401.9) Adeq control. Her updated medication list for this problem includes:    Toprol Xl 50 Mg Xr24h-tab (Metoprolol succinate) .Marland Kitchen... Take 1 tablet by mouth once a day  Other Orders: EKG w/ Interpretation ([address]) Nuclear Stress Test (Nuc Stress Test) Echocardiogram (Echo)  Patient Instructions: 1)  Your physician has requested that you have an echocardiogram.  Echocardiography is a painless test that uses sound waves to create images of your heart. It provides your doctor with information about the size and shape of your heart and how well your heart's chambers and valves are working.  This procedure takes approximately one hour. There are no restrictions for this procedure. 2)  Your physician has requested that you have an adenosine myoview.  For further information please visit w 3)  [email or web address].  Please follow instruction sheet, as given.  4)  We will call you with results of the above tests. 5)  Your physician recommends that you return for a FASTING lipid profile: day of stress test 786.09

## 2010-06-10 NOTE — Progress Notes (Signed)
Summary: med  refill/gp  Phone Note Refill Request Message from:  Fax from Pharmacy on February 15, 2010 10:27 AM  Refills Requested: Medication #1:  GLIPIZIDE 5 MG  TABS Take 2 tablets by mouth once a day   Last Refilled: 01/08/2010 Appt.scheduled 03/22/10.   Method Requested: Electronic Initial call taken by: Chinita Pester RN,  February 15, 2010 10:27 AM  Follow-up for Phone Call       Follow-up by: Blanch Media MD,  February 15, 2010 4:35 PM    Prescriptions: GLIPIZIDE 5 MG  TABS (GLIPIZIDE) Take 2 tablets by mouth once a day  #60 x 5   Entered and Authorized by:   Blanch Media MD   Signed by:   Blanch Media MD on 02/15/2010   Method used:   Electronically to        [facility] (retail)       [address]       [place], [place]  [address]       Ph: [phone]       Fax: [phone]   RxID:   [id number]

## 2010-06-10 NOTE — Progress Notes (Signed)
Summary: Nuclear Pre-Procedure  Phone Note Outgoing Call   Call placed by: Milana Na, EMT-P,  January 18, 2010 3:42 PM Summary of Call: Left message with information on [employer] Information Sheet (see scanned document for details).      Nuclear Med Background Indications for Stress Test: Evaluation for Ischemia, Surgical Clearance   History: Echo   Symptoms: DOE    Nuclear Pre-Procedure Cardiac Risk Factors: Hypertension, IDDM Type 2 Height (in): 64  Nuclear Med Study Referring MD:  P.Ross

## 2010-06-10 NOTE — Letter (Signed)
Summary: Generic Letter  [facility]  [address]   [place], [place] [address]   Phone: [phone]  Fax: [phone]    08/19/2009   Dear Dr. Fonnie Jarvis,  I evaluated Morgan Wilkerson for operative clearance on 08/19/09.  She has a history of hypertension and type 2 diabetes, both of which are well controlled.  Based on her history and recent lab work, her peri-operative cardiac mortality is approximately one percent.  Please call with any concerns or questions. Her medications are as as follows: 1)  Hydrocodone-acetaminophen 10-325 Mg Tabs (Hydrocodone-acetaminophen) .... Take one tablet 2x daily as needed for pain 2)  Flexeril 10 Mg Tabs (Cyclobenzaprine hcl) .... Take one tablet by mouth every 8 hours as needed for muscle cramps 3)  Methadone Hcl 10 Mg Tabs (Methadone hcl) .... Take 1 tablet by mouth 2x times a day 4)  Metformin Hcl 1000 Mg Tabs (Metformin hcl) .... Take 1 tablet, daily twice, by mouth 5)  Glipizide 5 Mg Tabs (Glipizide) .... Take 2 tablets by mouth once a day 6)  Oscal 500/200 D-3 500-200 Mg-unit Tabs (Calcium-vitamin d) .... Take 1 tablet, three times daily, by mouth. 7)  Relion N 100 Unit/ml Susp (Insulin isophane human) .... Inject 45 units subcutaneous in the morning and 25 units at bedtime daily 8)  Relion R 100 Unit/ml Soln (Insulin regular human) .... Inject into the skin of your abdomen using 2:50 correction scale plus 1:15 food scale as instructed 3 times a day 30 mins before meals. 9)  Lisinopril 10 Mg Tabs (Lisinopril) .... Take 1 tablet by mouth once a day 10)  Insulin Syringe 30g X 1/2" 1 Ml Misc (Insulin syringe-needle u-100) .... Use to inject insulin 4x daily 11)  Freestyle Lite Test Strp (Glucose blood) .... Use to checj your blood sugar 3-4x daily 12)  Lancets Misc (Lancets) .... Use to check your blood sugar 3-4x daily 13)  Fosamax 35 Mg Tabs (Alendronate sodium) .... Take 1 tablet by mouth every week. avoid lying down for 30 min. drink with  1 plenty water. 14)  Omeprazole 20 Mg Tbec (Omeprazole) .... Take 2 tablets by mouth in the morning before breakfast 15)  Reglan 5 Mg Tabs (Metoclopramide hcl) .Marland Kitchen.. 1 tablet by mouth before meals and at bedtime. 16)  Toprol Xl 50 Mg Xr24h-tab (Metoprolol succinate) .... Take 1 tablet by mouth once a day 17)  Prednisone 5 Mg Tabs (Prednisone) .... .take 3 tabs with meal every day for next 1 month and then decrease to 2 tabs a day untill you see md. 18)  Promethazine Hcl 25 Mg Tabs (Promethazine hcl) .... Take 1 tab as needed for nausea. 19)  Citalopram Hydrobromide 40 Mg Tabs (Citalopram hydrobromide) .... Take one half of a pill by mouth for the first 7 days, then increase to one full pill by mouth every day 20)  Lorazepam 0.5 Mg Tabs (Lorazepam) .... Take one pill as needed for panic attack   Sincerely,    Nelda Bucks, DO             Appended Document: Generic Letter Letter faxed to Dr. Fonnie Jarvis  [phone] per Dr Arvilla Market.  Appended Document: Generic Letter Letter faxed to Dr. Fonnie Jarvis [phone].  Appended Document: Generic Letter Generic letter refaxed to [facility] ,at Dr. Fonnie Jarvis, fax#  [phone].

## 2010-06-10 NOTE — Assessment & Plan Note (Signed)
Summary: body pain/gg   Vital Signs:  Patient profile:   55 year old female Height:      64 inches (162.56 cm) Weight:      210.0 pounds (95.45 kg) BMI:     36.18 Temp:     97.6 degrees F (36.44 degrees C) oral Pulse rate:   107 / minute BP sitting:   120 / 73  (right arm)  Vitals Entered By: Theotis Barrio NT II (March 08, 2010 4:34 PM) CC: 2-3 WEEKS  of pain-shoulder ankles, legs Is Patient Diabetic? Yes Did you bring your meter with you today? No Pain Assessment Patient in pain? yes     Location: legs and shoulder,ankles Intensity: 7 Type: aching Onset of pain  2-3weeks Nutritional Status BMI of > 30 = obese CBG Result 145  Does patient need assistance? Functional Status Self care Ambulation Wheelchair   Primary Care Provider:  Blondell Reveal MD  CC:  2-3 WEEKS  of pain-shoulder ankles and legs.  History of Present Illness: Patient is a 55 year old female who presents today for a ED follow up visit.  Patient was seen in the ed  on 10/24/2011because of pains all over the body, mostly in legs, knees, shoulders etc. She reports that she stopped taking her prednisone for about a month before as she was told to gradually taper the prednisone off. She reports that ever since she stopped taking her prednisone, she started having pains all over the body.   She denies any other complaints.    Preventive Screening-Counseling & Management  Alcohol-Tobacco     Alcohol type: AT TIMES/ HOLIDAYS  / WINE     Smoking Status: never     Passive Smoke Exposure: no  Caffeine-Diet-Exercise     Does Patient Exercise: no     Type of exercise: WALK TREADMILL     Times/week: 2  Problems Prior to Update: 1)  Other Dyspnea and Respiratory Abnormalities  (ICD-786.09) 2)  Murmur  (ICD-785.2) 3)  Chest Pain Unspecified  (ICD-786.50) 4)  Accidental Fall  (ICD-E888.9) 5)  Broken Tooth, Infected  (ICD-873.73) 6)  Preoperative Examination  (ICD-V72.84) 7)  Anemia  (ICD-285.9) 8)   Gastroesophageal Reflux Disease  (ICD-530.81) 9)  Knee Pain, Left  (ICD-719.46) 10)  Ankle Sprain, Left  (ICD-845.00) 11)  Abnormal Mammogram, Right Breast  (ICD-793.80) 12)  Epigastric Pain  (ICD-789.06) 13)  Dm  (ICD-250.00) 14)  Temporal Arteritis  (ICD-446.5) 15)  Transaminases, Serum, Elevated  (ICD-790.4) 16)  Hyponatremia  (ICD-276.1) 17)  Hyperglycemia  (ICD-790.29) 18)  Shoulder Pain, Left, Chronic  (ICD-719.41) 19)  Headache  (ICD-784.0) 20)  Weakness  (ICD-780.79) 21)  Dysuria  (ICD-788.1) 22)  Obesity Nos  (ICD-278.00) 23)  Hypertension  (ICD-401.9) 24)  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 Take One Tablet 2x Daily As Needed For Pain 2)  Flexeril 10 Mg Tabs (Cyclobenzaprine Hcl) .... Take One Tablet By Mouth Every 8 Hours As Needed For Muscle Cramps 3)  Methadone Hcl 10 Mg Tabs (Methadone Hcl) .... Take 1 Tablet By Mouth 2x Times A Day 4)  Metformin Hcl 1000 Mg  Tabs (Metformin Hcl) .... Take 1 Tablet, Daily Twice, By Mouth 5)  Glipizide 5 Mg  Tabs (Glipizide) .... Take 2 Tablets By Mouth Once A Day 6)  Relion N 100 Unit/ml  Susp (Insulin Isophane Human) .... Inject 42 Units Subcutaneous in The Morning and 26 Units At  Bedtime Daily 7)  Relion R 100 Unit/ml Soln (Insulin Regular Human) .... Inject Into The Skin of Your Abdomen Using 2:50 Correction Scale Plus 1:15 Food Scale As Instructed 3 Times A Day 30 Mins Before Meals. 8)  Insulin Syringe 30g X 1/2" 1 Ml Misc (Insulin Syringe-Needle U-100) .... Use To Inject Insulin 4x Daily 9)  Freestyle Lite Test  Strp (Glucose Blood) .... Use To Checj Your Blood Sugar 3-4x Daily 10)  Lancets  Misc (Lancets) .... Use To Check Your Blood Sugar 3-4x Daily 11)  Reglan 5 Mg Tabs (Metoclopramide Hcl) .Marland Kitchen.. 1 Tablet By Mouth Before Meals and At Bedtime. 12)  Toprol Xl 50 Mg Xr24h-Tab (Metoprolol Succinate) .... Take 1 Tablet  By Mouth Once A Day 13)  Effexor Xr 75 Mg Xr24h-Cap (Venlafaxine Hcl) .... Take 1 Capsule By Mouth Tid 14)  Lorazepam 0.5 Mg Tabs (Lorazepam) .... Take One Pill As Needed For Panic Attack 15)  Prednisone 10 Mg Tabs (Prednisone) .... Take 1 Tablet By Mouth Once A Day 16)  0tc  Priolsec .... Daily 17)  Simvastatin 20 Mg Tabs (Simvastatin) .Marland Kitchen.. 1 Tab Every Evening Prior To Bedtime  Current Medications (verified): 1)  Hydrocodone-Acetaminophen 10-325 Mg  Tabs (Hydrocodone-Acetaminophen) .... Take One Tablet 2x Daily As Needed For Pain 2)  Flexeril 10 Mg Tabs (Cyclobenzaprine Hcl) .... Take One Tablet By Mouth Every 8 Hours As Needed For Muscle Cramps 3)  Methadone Hcl 10 Mg Tabs (Methadone Hcl) .... Take 1 Tablet By Mouth 2x Times A Day 4)  Metformin Hcl 1000 Mg  Tabs (Metformin Hcl) .... Take 1 Tablet, Daily Twice, By Mouth 5)  Glipizide 5 Mg  Tabs (Glipizide) .... Take 2 Tablets By Mouth Once A Day 6)  Relion N 100 Unit/ml  Susp (Insulin Isophane Human) .... Inject 42 Units Subcutaneous in The Morning and 26 Units At Bedtime Daily 7)  Relion R 100 Unit/ml Soln (Insulin Regular Human) .... Inject Into The Skin of Your Abdomen Using 2:50 Correction Scale Plus 1:15 Food Scale As Instructed 3 Times A Day 30 Mins Before Meals. 8)  Insulin Syringe 30g X 1/2" 1 Ml Misc (Insulin Syringe-Needle U-100) .... Use To Inject Insulin 4x Daily 9)  Freestyle Lite Test  Strp (Glucose Blood) .... Use To Checj Your Blood Sugar 3-4x Daily 10)  Lancets  Misc (Lancets) .... Use To Check Your Blood Sugar 3-4x Daily 11)  Reglan 5 Mg Tabs (Metoclopramide Hcl) .Marland Kitchen.. 1 Tablet By Mouth Before Meals and At Bedtime. 12)  Toprol Xl 50 Mg Xr24h-Tab (Metoprolol Succinate) .... Take 1 Tablet By Mouth Once A Day 13)  Effexor Xr 75 Mg Xr24h-Cap (Venlafaxine Hcl) .... Take 1 Capsule By Mouth Tid 14)  Lorazepam 0.5 Mg Tabs (Lorazepam) .... Take One Pill As Needed For Panic Attack 15)  Prednisone 10 Mg Tabs (Prednisone) .... Take 1  Tablet By Mouth Once A Day 16)  0tc  Priolsec .... Daily 17)  Simvastatin 20 Mg Tabs (Simvastatin) .Marland Kitchen.. 1 Tab Every Evening Prior To Bedtime  Allergies (verified): 1)  ! Percocet 2)  ! Darvocet 3)  ! * Citric Acid  Past History:  Family History: Last updated: 2010-01-24 Father died in a road accident at 58. Mother- HTN. Aunts with intracerebral aneurysms.  Social History: Last updated: 2010/01/24 Pt's phone number is [phone] No tobacco Rare EtOH  Risk Factors: Exercise: no (03/08/2010)  Risk Factors: Smoking Status: never (03/08/2010) Passive Smoke Exposure: no (03/08/2010)  Past Medical History: Reviewed history from 24-Jan-2010 and no  changes required. Panic disorder with agoraphobia Hypertension Frostbite of left breast, hx of (7/07) Dysfunctional uterine bleeding, hx of, s/p hysterectomy Torn tendon in L shoulder R shoulder pain - bone cracked but went into the muscle.  Broke ring toe in 2001/2002 Temporal arteritis 05/09. DM/Hyperglycemia - 06/09. Severe cystitis post vaginal hysterectomy in 2003, s/p ABx bladder instillation X2 by urology Gastritis  Past Surgical History: Reviewed history from 01/04/2010 and no changes required. Hysterectomy Oophorectomy Temporal artery biopsy on 10/11/2007. Cystoscpy and hydraulic bladder distension. CHolecystectomy  Family History: Reviewed history from 01/04/2010 and no changes required. Father died in a road accident at 38. Mother- HTN. Aunts with intracerebral aneurysms.  Social History: Reviewed history from 01/04/2010 and no changes required. Pt's phone number is [phone] No tobacco Rare EtOH  Review of Systems      See HPI  Physical Exam  General:  alert, well-developed, and well-nourished.   Head:  normocephalic and atraumatic.   Neck:  supple.   Lungs:  normal respiratory effort, no intercostal retractions, no accessory muscle use, and normal breath sounds.   Heart:  normal rate, regular  rhythm, no murmur, and no gallop.   Abdomen:  soft, non-tender, normal bowel sounds, and no distention.   Msk:  normal ROM.   Pulses:  R radial normal.   Extremities:  no edema Neurologic:  alert & oriented X3.  alert & oriented X3, strength normal in all extremities, and gait normal.     Impression & Recommendations:  Problem # 1:  TEMPORAL ARTERITIS (ICD-446.5) Patient was diagnosed with possible Temporal Temporal arteritis. Despite a negative temporal artery biopsy, her headaches subsided only after starting prednisone therapy after ESR was elevated at multiple times at 70-80's. Initially the plan was to taper prednisone gradually to off and we planned to treat with prednisone for about a year. She was initially started on Prednisone 60 mg and we were able to taper it down all the  way to 20 mg a day but tapering it lower then 20 mg would make her headaches, body pains, fatigue to get worse and we would have to go back to higher doses of Prednisone. Her medical care over the last 1-2 years has been revolving around prednisone therapy and its associated high blood sugars resulting in tough to control DM. We will start her back on prednisone. Will follow up on their recommendations. Ideally we would like to stop her prednisone sooner rather than later, but not sure if she would need to be on prednisone for long term maintainance. But without a proper diagnosis and definite need for chronic steroid therapy, I am not comfortable continuing it any more. I will refer her to rheumatology for further recommendations.  Orders: Rheumatology Referral (Rheumatology)  Problem # 2:  HYPERTENSION (ICD-401.9)  Bp well controlled. continue current regimen.  Her updated medication list for this problem includes:    Toprol Xl 50 Mg Xr24h-tab (Metoprolol succinate) .Marland Kitchen... Take 1 tablet by mouth once a day  BP today: 120/73 Prior BP: 144/83 (01/04/2010)  Labs Reviewed: K+: 3.8 (07/16/2009) Creat: : 0.83  (07/16/2009)   Chol: 190 (02/02/2010)   HDL: 39.00 (02/02/2010)   LDL: 118 (02/02/2010)   TG: 165.0 (02/02/2010)  Her updated medication list for this problem includes:    Toprol Xl 50 Mg Xr24h-tab (Metoprolol succinate) .Marland Kitchen... Take 1 tablet by mouth once a day  Problem # 3:  DM (ICD-250.00) Last HbA1C was well controlled. Will check A1C and wil follow up. Will continue  current regimen.  Her updated medication list for this problem includes:    Metformin Hcl 1000 Mg Tabs (Metformin hcl) .Marland Kitchen... Take 1 tablet, daily twice, by mouth    Glipizide 5 Mg Tabs (Glipizide) .Marland Kitchen... Take 2 tablets by mouth once a day    Relion N 100 Unit/ml Susp (Insulin isophane human) ..... Inject 42 units subcutaneous in the morning and 26 units at bedtime daily    Relion R 100 Unit/ml Soln (Insulin regular human) ..... Inject into the skin of your abdomen using 2:50 correction scale plus 1:15 food scale as instructed 3 times a day 30 mins before meals.  Orders: T- Capillary Blood Glucose (82948) T-Hgb A1C (in-house) ([address]UE)  Labs Reviewed: Creat: 0.83 (07/16/2009)     Last Eye Exam: No diabetic retinopathy.    (03/26/2008) Reviewed HgBA1c results: 7.3 (11/24/2009)  6.8 (08/19/2009)  Her updated medication list for this problem includes:    Metformin Hcl 1000 Mg Tabs (Metformin hcl) .Marland Kitchen... Take 1 tablet, daily twice, by mouth    Glipizide 5 Mg Tabs (Glipizide) .Marland Kitchen... Take 2 tablets by mouth once a day    Relion N 100 Unit/ml Susp (Insulin isophane human) ..... Inject 42 units subcutaneous in the morning and 26 units at bedtime daily    Relion R 100 Unit/ml Soln (Insulin regular human) ..... Inject into the skin of your abdomen using 2:50 correction scale plus 1:15 food scale as instructed 3 times a day 30 mins before meals.  Complete Medication List: 1)  Hydrocodone-acetaminophen 10-325 Mg Tabs (Hydrocodone-acetaminophen) .... Take one tablet 2x daily as needed for pain 2)  Flexeril 10 Mg Tabs (Cyclobenzaprine  hcl) .... Take one tablet by mouth every 8 hours as needed for muscle cramps 3)  Methadone Hcl 10 Mg Tabs (Methadone hcl) .... Take 1 tablet by mouth 2x times a day 4)  Metformin Hcl 1000 Mg Tabs (Metformin hcl) .... Take 1 tablet, daily twice, by mouth 5)  Glipizide 5 Mg Tabs (Glipizide) .... Take 2 tablets by mouth once a day 6)  Relion N 100 Unit/ml Susp (Insulin isophane human) .... Inject 42 units subcutaneous in the morning and 26 units at bedtime daily 7)  Relion R 100 Unit/ml Soln (Insulin regular human) .... Inject into the skin of your abdomen using 2:50 correction scale plus 1:15 food scale as instructed 3 times a day 30 mins before meals. 8)  Insulin Syringe 30g X 1/2" 1 Ml Misc (Insulin syringe-needle u-100) .... Use to inject insulin 4x daily 9)  Freestyle Lite Test Strp (Glucose blood) .... Use to checj your blood sugar 3-4x daily 10)  Lancets Misc (Lancets) .... Use to check your blood sugar 3-4x daily 11)  Reglan 5 Mg Tabs (Metoclopramide hcl) .Marland Kitchen.. 1 tablet by mouth before meals and at bedtime. 12)  Toprol Xl 50 Mg Xr24h-tab (Metoprolol succinate) .... Take 1 tablet by mouth once a day 13)  Effexor Xr 75 Mg Xr24h-cap (Venlafaxine hcl) .... Take 1 capsule by mouth tid 14)  Lorazepam 0.5 Mg Tabs (Lorazepam) .... Take one pill as needed for panic attack 15)  Prednisone 10 Mg Tabs (Prednisone) .... Take 1 tablet by mouth once a day 16)  0tc Priolsec  .... Daily 17)  Simvastatin 20 Mg Tabs (Simvastatin) .Marland Kitchen.. 1 tab every evening prior to bedtime  Patient Instructions: 1)  Please schedule a follow-up appointment in 3 months. 2)  Follow up with Rheumatologist..   Orders Added: 1)  T- Capillary Blood Glucose [82948] 2)  T-Hgb A1C (  in-house) [[address]UE] 3)  Est. Patient Level III [45409] 4)  Rheumatology Referral [Rheumatology] 5)  Est. Patient Level III [81191]     Prevention & Chronic Care Immunizations   Influenza vaccine: Not documented   Influenza vaccine deferral: Not  available  (11/24/2009)    Tetanus booster: Not documented   Td booster deferral: Deferred  (06/01/2009)    Pneumococcal vaccine: Not documented  Colorectal Screening   Hemoccult: Not documented   Hemoccult action/deferral: Deferred  (11/24/2009)    Colonoscopy: Not documented   Colonoscopy action/deferral: Deferred  (11/24/2009)  Other Screening   Pap smear: Normal  (03/29/2006)   Pap smear action/deferral: Deferred  (11/24/2009)    Mammogram: BI-RADS CATEGORY 3:  Probably benign finding(s) - short interval^MM DIGITAL DIAG LTD R  (06/04/2008)   Mammogram action/deferral: Deferred  (11/24/2009)   Smoking status: never  (03/08/2010)  Diabetes Mellitus   HgbA1C: 6.6  (03/08/2010)    Eye exam: No diabetic retinopathy.     (03/26/2008)   Diabetic eye exam action/deferral: Deferred  (11/24/2009)   Eye exam due: 10/07/2009    Foot exam: yes  (11/24/2009)   Foot exam action/deferral: Do today   High risk foot: No  (11/24/2009)   Foot care education: Done  (11/24/2009)   Foot exam due: 11/23/2010    Urine microalbumin/creatinine ratio: 5.7  (01/16/2009)   Urine microalbumin/cr due: 01/07/2010  Lipids   Total Cholesterol: 190  (02/02/2010)   LDL: 118  (02/02/2010)   LDL Direct: Not documented   HDL: 39.00  (02/02/2010)   Triglycerides: 165.0  (02/02/2010)   Lipid panel due: 01/07/2010  Hypertension   Last Blood Pressure: 120 / 73  (03/08/2010)   Serum creatinine: 0.83  (07/16/2009)   Serum potassium 3.8  (07/16/2009)  Self-Management Support :   Personal Goals (by the next clinic visit) :     Personal A1C goal: 7  (08/19/2009)     Personal blood pressure goal: 130/80  (08/19/2009)     Personal LDL goal: 70  (08/19/2009)    Patient will work on the following items until the next clinic visit to reach self-care goals:     Medications and monitoring: take my medicines every day, bring all of my medications to every visit, examine my feet every day  (03/08/2010)      Eating: drink diet soda or water instead of juice or soda, eat more vegetables, use fresh or frozen vegetables, eat foods that are low in salt, eat baked foods instead of fried foods, eat fruit for snacks and desserts, limit or avoid alcohol  (03/08/2010)     Activity: take a 30 minute walk every day  (11/24/2009)     Home glucose monitoring frequency: 3 times a day  (11/24/2009)    Diabetes self-management support: Resources for patients handout  (03/08/2010)   Last diabetes self-management training by diabetes educator: 10/02/2008   Last medical nutrition therapy: 06/09/2008    Hypertension self-management support: Resources for patients handout  (03/08/2010)    Self-management comments: PATIENT WALKS [employer] .   Laboratory Results   Blood Tests   Date/Time Received: March 08, 2010 5:02 PM Date/Time Reported: Burke Keels  March 08, 2010 5:02 PM   HGBA1C: 6.6%   (Normal Range: Non-Diabetic - 3-6%   Control Diabetic - 6-8%) CBG Random:: 145mg /dL  Comments: cbg repeated in Clinic Lab value of 102  Burke Keels  March 08, 2010 5:03 PM

## 2010-06-10 NOTE — Progress Notes (Signed)
Summary: refill/ hla  Phone Note Refill Request Message from:  Fax from Pharmacy on May 25, 2009 4:50 PM  Refills Requested: Medication #1:  GLIPIZIDE 5 MG  TABS Take 2 tablets by mouth once a day   Dosage confirmed as above?Dosage Confirmed   Supply Requested: 6 months   Last Refilled: 04/16/2009 Initial call taken by: Marin Roberts RN,  May 25, 2009 4:52 PM    Prescriptions: GLIPIZIDE 5 MG  TABS (GLIPIZIDE) Take 2 tablets by mouth once a day  #60 x 5   Entered and Authorized by:   Doneen Poisson MD   Signed by:   Doneen Poisson MD on 05/25/2009   Method used:   Electronically to        [facility] (retail)       [address]       [place], [place]  [address]       Ph: [phone]       Fax: [phone]   RxID:   [id number]

## 2010-06-10 NOTE — Progress Notes (Signed)
Summary: refill/ hla  Phone Note Refill Request Message from:  Fax from Pharmacy on July 20, 2009 2:35 PM  Refills Requested: Medication #1:  METFORMIN HCL 1000 MG  TABS Take 1 tablet   Last Refilled: 1/16 last visit and labs 3/10  Initial call taken by: Marin Roberts RN,  July 20, 2009 2:35 PM  Follow-up for Phone Call       Follow-up by: Blondell Reveal MD,  July 21, 2009 9:57 AM    Prescriptions: METFORMIN HCL 1000 MG  TABS (METFORMIN HCL) Take 1 tablet, daily twice, by mouth  #60 Tablet x 11   Entered and Authorized by:   Blondell Reveal MD   Signed by:   Blondell Reveal MD on 07/21/2009   Method used:   Electronically to        [facility] (retail)       [address]       [place], [place]  [address]       Ph: [phone]       Fax: [phone]   RxID:   [id number]

## 2010-06-10 NOTE — Progress Notes (Signed)
Summary: tooth abcess and pain  Phone Note Call from Patient   Caller: Patient Reason for Call: Acute Illness Details for Reason: tooth pain and swelling Summary of Call: tooth pain and swellin started about 5 days ago. She is having swelling and pain that goes into ears. She can not chew anything. She had abcess in these teeth before and has dentist but she can not reach any one given that this is a long weekend. She wants prescription for amoxicillin or ampicillin which worked in the past.  I will send in the prescription for amoxicillin Initial call taken by: Clerance Lav MD,  Oct 03, 2009 3:39 PM    New/Updated Medications: AMOXICILLIN 500 MG CAPS (AMOXICILLIN) Take 1 tablet by mouth two times a day Prescriptions: AMOXICILLIN 500 MG CAPS (AMOXICILLIN) Take 1 tablet by mouth two times a day  #20 x 0   Entered and Authorized by:   Clerance Lav MD   Signed by:   Clerance Lav MD on 10/03/2009   Method used:   Electronically to        [facility] (retail)       [address]       [place], [place]  [address]       Ph: [phone]       Fax: [phone]   RxID:   [id number]

## 2010-06-10 NOTE — Progress Notes (Signed)
Summary: Refill/gh  Phone Note Refill Request Message from:  Fax from Pharmacy on March 29, 2010 12:07 PM  Refills Requested: Medication #1:  TOPROL XL 50 MG XR24H-TAB Take 1 tablet by mouth once a day   Last Refilled: 03/02/2010  Method Requested: Electronic Initial call taken by: Angelina Ok RN,  March 29, 2010 12:07 PM  Follow-up for Phone Call       Follow-up by: Blondell Reveal MD,  March 29, 2010 1:38 PM    Prescriptions: TOPROL XL 50 MG XR24H-TAB (METOPROLOL SUCCINATE) Take 1 tablet by mouth once a day  #30 x 11   Entered and Authorized by:   Blondell Reveal MD   Signed by:   Blondell Reveal MD on 03/29/2010   Method used:   Electronically to        [facility] (retail)       [address]       [place], [place]  [address]       Ph: [phone]       Fax: [phone]   RxID:   [id number]

## 2010-06-10 NOTE — Progress Notes (Signed)
Summary: prednisone, pain/ hla  Phone Note Call from Patient   Summary of Call: pt states she is taking 3 10mg  tabs of prednisone daily since mon 11/14 and will continue to take until pain is better. Initial call taken by: Marin Roberts RN,  March 26, 2010 5:52 PM  Follow-up for Phone Call        I repeatedly warned her not to do this by herself and to call the clinic before she increases the doses. I would recommend her to take only 20 mg every day. Follow-up by: Blondell Reveal MD,  March 26, 2010 6:57 PM     Appended Document: prednisone, pain/ hla Pt. was called; no answer; message left to call the clinic.

## 2010-06-10 NOTE — Progress Notes (Signed)
Summary: medication refill/gp  Phone Note Call from Patient   Caller: Patient Summary of Call: Pt.request Prednisone be increase from once daily to two times a day.  States she has pain/swelling in her legs. She has not had any in 5 days. She cannot get another refill yet per pharmacy;  too early for her insurance to pay.  [phone]   Thanks Initial call taken by: Chinita Pester RN,  March 24, 2010 12:12 PM  Follow-up for Phone Call        Ask her to increase the prednisone to 20 mg once daily and not twice daily. I will send a prescription to her pharmacy. Follow-up by: Blondell Reveal MD,  March 25, 2010 10:51 PM    Prescriptions: PREDNISONE 10 MG TABS (PREDNISONE) Take 1 tablet by mouth once a day  #60 x 2   Entered and Authorized by:   Blondell Reveal MD   Signed by:   Blondell Reveal MD on 03/25/2010   Method used:   Electronically to        [facility] (retail)       [address]       [place], [place]  [address]       Ph: [phone]       Fax: [phone]   RxID:   [id number]

## 2010-06-10 NOTE — Progress Notes (Signed)
Summary: phone/gg  Phone Note From Other Clinic   Summary of Call: ANother call from Dr Lestine Box office stating pt told pre-op nurse that she had episode of chest pain last week.  THe surgical center is requesting medical clearence specific to this episode of chest pain before surgery is done. Shall I have her come back in for this?  Initial call taken by: Merrie Roof RN,  Sep 24, 2009 10:37 AM  Follow-up for Phone Call        Called patient and talked to her about her chest pain. Given that this is an elective surgery, ang given her risk factors and age, will refer her to cardiology for a  stress test and surgical clearance. Follow-up by: Blondell Reveal MD,  Sep 24, 2009 10:46 AM      Impression & Recommendations:  Problem # 1:  PREOPERATIVE EXAMINATION (ICD-V72.84) Patient complained of chest pain pre-op and her ankle surgery has been cancelled for clearance of her chest pain. Given her risk factors and elective nature of the surgery, will send her cardiology for a stress test and pre-op clearance.  Called patient and informed about the plan.  Orders: Cardiology Referral (Cardiology)   Complete Medication List: 1)  Hydrocodone-acetaminophen 10-325 Mg Tabs (Hydrocodone-acetaminophen) .... Take one tablet 2x daily as needed for pain 2)  Flexeril 10 Mg Tabs (Cyclobenzaprine hcl) .... Take one tablet by mouth every 8 hours as needed for muscle cramps 3)  Methadone Hcl 10 Mg Tabs (Methadone hcl) .... Take 1 tablet by mouth 2x times a day 4)  Metformin Hcl 1000 Mg Tabs (Metformin hcl) .... Take 1 tablet, daily twice, by mouth 5)  Glipizide 5 Mg Tabs (Glipizide) .... Take 2 tablets by mouth once a day 6)  Oscal 500/200 D-3 500-200 Mg-unit Tabs (Calcium-vitamin d) .... Take 1 tablet, three times daily, by mouth. 7)  Relion N 100 Unit/ml Susp (Insulin isophane human) .... Inject 42 units subcutaneous in the morning and 26 units at bedtime daily 8)  Relion R 100 Unit/ml Soln (Insulin  regular human) .... Inject into the skin of your abdomen using 2:50 correction scale plus 1:15 food scale as instructed 3 times a day 30 mins before meals. 9)  Lisinopril 10 Mg Tabs (Lisinopril) .... Take 1 tablet by mouth once a day 10)  Insulin Syringe 30g X 1/2" 1 Ml Misc (Insulin syringe-needle u-100) .... Use to inject insulin 4x daily 11)  Freestyle Lite Test Strp (Glucose blood) .... Use to checj your blood sugar 3-4x daily 12)  Lancets Misc (Lancets) .... Use to check your blood sugar 3-4x daily 13)  Fosamax 35 Mg Tabs (Alendronate sodium) .... Take 1 tablet by mouth every week. avoid lying down for 30 min. drink with 1 plenty water. 14)  Omeprazole 20 Mg Tbec (Omeprazole) .... Take 2 tablets by mouth in the morning before breakfast 15)  Reglan 5 Mg Tabs (Metoclopramide hcl) .Marland Kitchen.. 1 tablet by mouth before meals and at bedtime. 16)  Toprol Xl 50 Mg Xr24h-tab (Metoprolol succinate) .... Take 1 tablet by mouth once a day 17)  Prednisone 5 Mg Tabs (Prednisone) .... .take 3 tabs with meal every day for next 1 month and then decrease to 2 tabs a day untill you see md. 18)  Promethazine Hcl 25 Mg Tabs (Promethazine hcl) .... Take 1 tab as needed for nausea. 19)  Citalopram Hydrobromide 40 Mg Tabs (Citalopram hydrobromide) .... Take one half of a pill by mouth for the first 7 days, then  increase to one full pill by mouth every day 20)  Lorazepam 0.5 Mg Tabs (Lorazepam) .... Take one pill as needed for panic attack 21)  Amoxicillin 500 Mg Caps (Amoxicillin) .... Take 1 tablet by mouth two times a day for 7 days.

## 2010-06-10 NOTE — Assessment & Plan Note (Signed)
Summary: med clear. for surgery/gg   Vital Signs:  Patient profile:   55 year old female Height:      64 inches (162.56 cm) Weight:      212.5 pounds (96.59 kg) BMI:     36.61 Temp:     97.7 degrees F oral Pulse rate:   104 / minute BP sitting:   144 / 86  (right arm)  Vitals Entered By: Chinita Pester RN (August 19, 2009 10:47 AM) CC: Need clearance for surgery- left ankle. Is Patient Diabetic? Yes Did you bring your meter with you today? No Pain Assessment Patient in pain? yes     Location: left ankle Intensity: 6 Type: aching Onset of pain  Chronic Nutritional Status BMI of > 30 = obese CBG Result 151  Have you ever been in a relationship where you felt threatened, hurt or afraid?No   Does patient need assistance? Functional Status Self care Ambulation Impaired:Risk for fall Comments uses one crutch   Diabetic Foot Exam Last Podiatry Exam Date: 08/19/2009  Foot Inspection Is there a history of a foot ulcer?              No Is there a foot ulcer now?              No Can the patient see the bottom of their feet?          Yes Are the shoes appropriate in style and fit?          Yes Is there swelling or an abnormal foot shape?          No Are the toenails long?                No Are the toenails thick?                No Are the toenails ingrown?              Yes Is there heavy callous build-up?              No  Diabetic Foot Care Education Patient educated on appropriate care of diabetic feet.  Pulse Check          Right Foot          Left Foot Dorsalis Pedis:        normal            normal Comments: Discoloration of big toe and 2nd toe on right foot. Skin peeling on right foot. Decreased sensation on filament test.   10-g (5.07) Semmes-Weinstein Monofilament Test Performed by: Chinita Pester RN          Right Foot          Left Foot Visual Inspection     normal         normal Test Control      normal         normal Site 1         normal         normal Site  2         normal         normal Site 3         normal         normal Site 4         normal         normal Site 5         normal  normal Site 6         normal         normal Site 7         abnormal         normal Site 8         normal         normal Site 9         abnormal         normal   Primary Care Provider:  Blondell Reveal MD  CC:  Need clearance for surgery- left ankle.Marland Kitchen  History of Present Illness: 55 year old female with PMH of  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 Chronic back pain on methadone and Vicodin. Ankle pain for last 6 months on crutches.  SHe presents today for surgical clearance.  She is planning to have orthopedic surgery on her ankle; scheduling is dependant upon her clearance.  She reports her surgery will  entail repair of 1+ tendons in her ankle; she does not know if this will be done under general or local anesthesia.    During the course of our conversation, it becomes clear Morgan Wilkerson' anxiety,agoraphobia, and panic d/o symptoms have been escalating over the past few weeks.  It seems this is primarily 2/2 increased fear regarding surgery and anticipated pain during recovery.  She is currently taking amitriptyline at night to help with sleep but has not taken any other psychiatric meds in a number of months.  She feels her symptoms, particularly agoraphobia, are becoming increasingly hard to control.  She is currently experiencing a severe panic attack every day -  every other day.  She denies suicidal and homicidal ideation.  She is very interested in trying a medicine to help alleviate her symptoms.  SHe understands any medication she starts may take awhile before she notices improvement in her symptoms.  She denies c/p, sob,doe, h/a, dizziness, syncope, visual changes, fever, chills, weakness, numbness, and other neurological symptoms.  She has no other concern or  complaints.   Depression History:      The patient denies a depressed mood most of the day and a diminished interest in her usual daily activities.  Positive alarm features for depression include insomnia, fatigue (loss of energy), and impaired concentration (indecisiveness).  However, she denies significant weight loss, significant weight gain, hypersomnia, psychomotor agitation, psychomotor retardation, feelings of worthlessness (guilt), and recurrent thoughts of death or suicide.  The patient denies symptoms of a manic disorder including persistently & abnormally elevated mood, abnormally & persistently irritable mood, less need for sleep, talkative or feels need to keep talking, distractibility, increase in goal-directed activity, psychomotor agitation, and inflated self-esteem or grandiosity.         Depression Treatment History:  Prior Medication Used:   Start Date: Assessment of Effect:   Comments:  Celexa (citalopram)     08/19/2009     --       --   Preventive Screening-Counseling & Management  Alcohol-Tobacco     Alcohol type: AT TIMES/ HOLIDAYS  / WINE     Smoking Status: never     Passive Smoke Exposure: no  Caffeine-Diet-Exercise     Does Patient Exercise: no  Current Problems (verified): 1)  Anemia  (ICD-285.9) 2)  Gastroesophageal Reflux Disease  (ICD-530.81) 3)  Knee Pain, Left  (ICD-719.46) 4)  Ankle Sprain, Left  (ICD-845.00) 5)  Abnormal Mammogram, Right Breast  (ICD-793.80)  6)  Epigastric Pain  (ICD-789.06) 7)  Dm  (ICD-250.00) 8)  Temporal Arteritis  (ICD-446.5) 9)  Transaminases, Serum, Elevated  (ICD-790.4) 10)  Hyponatremia  (ICD-276.1) 11)  Hyperglycemia  (ICD-790.29) 12)  Shoulder Pain, Left, Chronic  (ICD-719.41) 13)  Headache  (ICD-784.0) 14)  Weakness  (ICD-780.79) 15)  Dysuria  (ICD-788.1) 16)  Symptom, Rash, Oth Nonspecific Skin Eruption  (ICD-782.1) 17)  Frostbite  (ICD-991.3) 18)  Obesity Nos  (ICD-278.00) 19)  Hypertension  (ICD-401.9) 20)   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 Take One Tablet 2x Daily As Needed For Pain 2)  Flexeril 10 Mg Tabs (Cyclobenzaprine Hcl) .... Take One Tablet By Mouth Every 8 Hours As Needed For Muscle Cramps 3)  Methadone Hcl 10 Mg Tabs (Methadone Hcl) .... Take 1 Tablet By Mouth 2x Times A Day 4)  Metformin Hcl 1000 Mg  Tabs (Metformin Hcl) .... Take 1 Tablet, Daily Twice, By Mouth 5)  Glipizide 5 Mg  Tabs (Glipizide) .... Take 2 Tablets By Mouth Once A Day 6)  Oscal 500/200 D-3 500-200 Mg-Unit  Tabs (Calcium-Vitamin D) .... Take 1 Tablet, Three Times Daily, By Mouth. 7)  Relion N 100 Unit/ml  Susp (Insulin Isophane Human) .... Inject 45 Units Subcutaneous in The Morning and 25 Units At Bedtime Daily 8)  Relion R 100 Unit/ml Soln (Insulin Regular Human) .... Inject Into The Skin of Your Abdomen Using 2:50 Correction Scale Plus 1:15 Food Scale As Instructed 3 Times A Day 30 Mins Before Meals. 9)  Lisinopril 10 Mg Tabs (Lisinopril) .... Take 1 Tablet By Mouth Once A Day 10)  Insulin Syringe 30g X 1/2" 1 Ml Misc (Insulin Syringe-Needle U-100) .... Use To Inject Insulin 4x Daily 11)  Freestyle Lite Test  Strp (Glucose Blood) .... Use To Checj Your Blood Sugar 3-4x Daily 12)  Lancets  Misc (Lancets) .... Use To Check Your Blood Sugar 3-4x Daily 13)  Fosamax 35 Mg Tabs (Alendronate Sodium) .... Take 1 Tablet By Mouth Every Week. Avoid Lying Down For 30 Min. Drink With 1 [employer]. 14)  Omeprazole 20 Mg Tbec (Omeprazole) .... Take 2 Tablets By Mouth in The Morning Before Breakfast 15)  Reglan 5 Mg Tabs (Metoclopramide Hcl) .Marland Kitchen.. 1 Tablet By Mouth Before Meals and At Bedtime. 16)  Toprol Xl 50 Mg Xr24h-Tab (Metoprolol Succinate) .... Take 1 Tablet By Mouth Once A Day 17)  Prednisone 5 Mg Tabs (Prednisone) .... .take 3 Tabs With Meal Every Day For Next 1 Month and Then Decrease To 2 Tabs A Day  Untill You See Md. 18)  Promethazine Hcl 25 Mg Tabs (Promethazine Hcl) .... Take 1 Tab As Needed For Nausea. 19)  Citalopram Hydrobromide 40 Mg Tabs (Citalopram Hydrobromide) .... Take One Half of A Pill By Mouth For The First 7 Days, Then Increase To One Full Pill By Mouth Every Day 20)  Lorazepam 0.5 Mg Tabs (Lorazepam) .... Take One Pill As Needed For Panic Attack  Allergies: 1)  ! Percocet 2)  ! Darvocet 3)  ! * Citric Acid  Past History:  Past medical, surgical, family and social histories (including risk factors) reviewed, and no changes noted (except as noted below).  Past Medical History: Reviewed history from 12/13/2007 and no changes required. Panic disorder with agoraphobia Hypertension Frostbite of left breast, hx of (7/07) Dysfunctional uterine bleeding, hx of, s/p hysterectomy Torn tendon in L shoulder R shoulder pain - bone cracked  but went into the muscle.  Broke ring toe in 2001/2002 Temporal arteritis 05/09. DM/Hyperglycemia - 06/09. Severe cystitis post vaginal hysterectomy in 2003, s/p ABx bladder instillation X2 by urology  Past Surgical History: Reviewed history from 12/19/2007 and no changes required. Hysterectomy Oophorectomy. Temporal artery biopsy on 10/11/2007. Cystoscpy and hydraulic bladder distension.  Family History: Reviewed history from 11/14/2007 and no changes required. Father died in a road accident at 63. Mother- HTN.  Social History: Reviewed history from 12/15/2006 and no changes required. Pt's phone number is [phone] Patient Exercise:  no  Review of Systems Psych:  Complains of anxiety, depression, easily tearful, panic attacks, and sense of great danger; denies alternate hallucination ( auditory/visual), easily angered, irritability, suicidal thoughts/plans, thoughts of violence, unusual visions or sounds, and thoughts /plans of harming others.  Physical Exam  General:  alert, well-developed, well-nourished, and  well-hydrated.   Head:  normocephalic and atraumatic.   Eyes:  vision grossly intact.   Mouth:  pharynx pink and moist.   Lungs:  Normal respiratory effort Lungs are clear to auscultation Heart:  Normal rate and regular rhythm.no murmur, no gallop, and no rub.   Msk:  no joint tenderness, no joint warmth, no redness over joints, no joint instability, and no crepitation.   Extremities:  no edema Neurologic:  Pt walking with single crutch 2/2 left ankle pain.  No focal deficit. Skin:  turgor normal, color normal, and no rashes.   Psych:  Oriented X3, memory intact for recent and remote, good eye contact, not agitated, not suicidal, not homicidal, labile affect, and tearful.     Impression & Recommendations:  Problem # 1:  PREOPERATIVE EXAMINATION (ICD-V72.84) Pt is without signs,symptoms,physical or lab findings to suggest any disease/complication to prevent her from proceeding with surgery. Completed letter for pre-op medical clearance.  WIll fax to ortho today; pts only significant cardiac risk factor is her dx of DM; she has a 1% peri-operative cardiac mortality risk.  Will give second copy of letter stating her risk and medical clearance to pt.  Problem # 2:  PANIC DISORDER WITH AGORAPHOBIA (ICD-300.21) Spent approx 45 minutes with patient discussing symptoms are treatment plan for this problem.  She reports successfull medical therapy in the past with Effexor; however she is unable to afford to $240/mo cost of this medication.  Will stop her amitriptyline and transtion her to Citalopram.  Will prescribe ativan to be used over the next month for panic attacks during transtion to celexa.  Had a long and frank discussion with the patient regarding use of benzos; she is only to use the medication to abort a panic attack, she is not to take the medicine otherwise.  She reports experiencing 1 severe attack. day; she will be given sufficient pills (#21) to last her for the 3 weeks it will take for the  citalopram to have an effect.  She will not receive any refills of her ativan and understands that the next step will be titration of the celexa dose.  She expresses understanding and agreement to this plan.   Will see her again in 2-3 weeks to assess for improvement of symptoms and medication side effects.  If she returns in 2 weeks, will review symptoms and schedule another 2 week f/u to assess need for titration of celexa.  If she returns in 3 weeks, will assess symptoms, etc and  need to increase dose of celexa at that time; pt should have at least 3 weeks of therapy before considering  dose titration.  Will also review for signs of suicidal/homicidal ideation.    Problem # 3:  KNEE PAIN, LEFT (ICD-719.46) Stable.  Plan for surgical intervention per ortho. Her updated medication list for this problem includes:    Hydrocodone-acetaminophen 10-325 Mg Tabs (Hydrocodone-acetaminophen) .Marland Kitchen... Take one tablet 2x daily as needed for pain    Flexeril 10 Mg Tabs (Cyclobenzaprine hcl) .Marland Kitchen... Take one tablet by mouth every 8 hours as needed for muscle cramps    Methadone Hcl 10 Mg Tabs (Methadone hcl) .Marland Kitchen... Take 1 tablet by mouth 2x times a day  Problem # 4:  DM (ICD-250.00) Stable.  Pt doing very well and at goal. Her updated medication list for this problem includes:    Metformin Hcl 1000 Mg Tabs (Metformin hcl) .Marland Kitchen... Take 1 tablet, daily twice, by mouth    Glipizide 5 Mg Tabs (Glipizide) .Marland Kitchen... Take 2 tablets by mouth once a day    Relion N 100 Unit/ml Susp (Insulin isophane human) ..... Inject 45 units subcutaneous in the morning and 25 units at bedtime daily    Relion R 100 Unit/ml Soln (Insulin regular human) ..... Inject into the skin of your abdomen using 2:50 correction scale plus 1:15 food scale as instructed 3 times a day 30 mins before meals.    Lisinopril 10 Mg Tabs (Lisinopril) .Marland Kitchen... Take 1 tablet by mouth once a day  Orders: T- Capillary Blood Glucose (16109) T-Hgb A1C (in-house)  (60454UJ)  Labs Reviewed: Creat: 0.83 (07/16/2009)     Last Eye Exam: No diabetic retinopathy.    (03/26/2008) Reviewed HgBA1c results: 6.8 (08/19/2009)  7.0 (06/01/2009)  Complete Medication List: 1)  Hydrocodone-acetaminophen 10-325 Mg Tabs (Hydrocodone-acetaminophen) .... Take one tablet 2x daily as needed for pain 2)  Flexeril 10 Mg Tabs (Cyclobenzaprine hcl) .... Take one tablet by mouth every 8 hours as needed for muscle cramps 3)  Methadone Hcl 10 Mg Tabs (Methadone hcl) .... Take 1 tablet by mouth 2x times a day 4)  Metformin Hcl 1000 Mg Tabs (Metformin hcl) .... Take 1 tablet, daily twice, by mouth 5)  Glipizide 5 Mg Tabs (Glipizide) .... Take 2 tablets by mouth once a day 6)  Oscal 500/200 D-3 500-200 Mg-unit Tabs (Calcium-vitamin d) .... Take 1 tablet, three times daily, by mouth. 7)  Relion N 100 Unit/ml Susp (Insulin isophane human) .... Inject 45 units subcutaneous in the morning and 25 units at bedtime daily 8)  Relion R 100 Unit/ml Soln (Insulin regular human) .... Inject into the skin of your abdomen using 2:50 correction scale plus 1:15 food scale as instructed 3 times a day 30 mins before meals. 9)  Lisinopril 10 Mg Tabs (Lisinopril) .... Take 1 tablet by mouth once a day 10)  Insulin Syringe 30g X 1/2" 1 Ml Misc (Insulin syringe-needle u-100) .... Use to inject insulin 4x daily 11)  Freestyle Lite Test Strp (Glucose blood) .... Use to checj your blood sugar 3-4x daily 12)  Lancets Misc (Lancets) .... Use to check your blood sugar 3-4x daily 13)  Fosamax 35 Mg Tabs (Alendronate sodium) .... Take 1 tablet by mouth every week. avoid lying down for 30 min. drink with 1 plenty water. 14)  Omeprazole 20 Mg Tbec (Omeprazole) .... Take 2 tablets by mouth in the morning before breakfast 15)  Reglan 5 Mg Tabs (Metoclopramide hcl) .Marland Kitchen.. 1 tablet by mouth before meals and at bedtime. 16)  Toprol Xl 50 Mg Xr24h-tab (Metoprolol succinate) .... Take 1 tablet by mouth once a day 17)  Prednisone 5 Mg Tabs (Prednisone) .... .take 3 tabs with meal every day for next 1 month and then decrease to 2 tabs a day untill you see md. 18)  Promethazine Hcl 25 Mg Tabs (Promethazine hcl) .... Take 1 tab as needed for nausea. 19)  Citalopram Hydrobromide 40 Mg Tabs (Citalopram hydrobromide) .... Take one half of a pill by mouth for the first 7 days, then increase to one full pill by mouth every day 20)  Lorazepam 0.5 Mg Tabs (Lorazepam) .... Take one pill as needed for panic attack  Patient Instructions: 1)  Please schedule a follow-up appointment in 2-3 weeks. 2)  STOP taking amitriptyline. 3)  Start taking your new medicines as directed. 4)  The Ativan is to be used ONLY when you are having an anxiety attack. 5)  Good luck with your surgery! Prescriptions: LORAZEPAM 0.5 MG TABS (LORAZEPAM) Take one pill as needed for panic attack  #21 x 0   Entered and Authorized by:   Nelda Bucks DO   Signed by:   Nelda Bucks DO on 08/19/2009   Method used:   Print then Give to Patient   RxID:   [id number] CITALOPRAM HYDROBROMIDE 40 MG TABS (CITALOPRAM HYDROBROMIDE) Take one half of a pill by mouth for the first 7 days, then increase to one full pill by mouth every day  #30 x 1   Entered and Authorized by:   Nelda Bucks DO   Signed by:   Nelda Bucks DO on 08/19/2009   Method used:   Electronically to        [facility] (retail)       [address]       [place], [place]  [address]       Ph: [phone]       Fax: [phone]   RxID:   [id number]   Prevention & Chronic Care Immunizations   Influenza vaccine: Not documented   Influenza vaccine deferral: Refused  (06/01/2009)    Tetanus booster: Not documented   Td booster deferral: Deferred  (06/01/2009)    Pneumococcal vaccine: Not documented  Colorectal Screening   Hemoccult: Not documented    Colonoscopy: Not documented  Other Screening   Pap smear: Normal  (03/29/2006)     Mammogram: BI-RADS CATEGORY 3:  Probably benign finding(s) - short interval^MM DIGITAL DIAG LTD R  (06/04/2008)   Smoking status: never  (08/19/2009)  Diabetes Mellitus   HgbA1C: 6.8  (08/19/2009)    Eye exam: No diabetic retinopathy.     (03/26/2008)   Eye exam due: 10/07/2009    Foot exam: yes  (07/21/2008)   Foot exam action/deferral: Do today   High risk foot: Not documented   Foot care education: Done  (08/19/2009)    Urine microalbumin/creatinine ratio: 5.7  (01/16/2009)   Urine microalbumin/cr due: 01/07/2010    Diabetes flowsheet reviewed?: Yes   Progress toward A1C goal: At goal  Lipids   Total Cholesterol: 176  (01/16/2009)   LDL: 80  (01/16/2009)   LDL Direct: Not documented   HDL: 50  (01/16/2009)   Triglycerides: 232  (01/16/2009)   Lipid panel due: 01/07/2010  Hypertension   Last Blood Pressure: 144 / 86  (08/19/2009)   Serum creatinine: 0.83  (07/16/2009)   Serum potassium 3.8  (07/16/2009)    Hypertension flowsheet reviewed?: Yes   Progress toward BP goal: Unchanged  Self-Management Support :   Personal Goals (by the  next clinic visit) :     Personal A1C goal: 7  (08/19/2009)     Personal blood pressure goal: 130/80  (08/19/2009)     Personal LDL goal: 70  (08/19/2009)    Patient will work on the following items until the next clinic visit to reach self-care goals:     Medications and monitoring: check my blood sugar, bring all of my medications to every visit, examine my feet every day  (08/19/2009)     Eating: eat more vegetables, use fresh or frozen vegetables, eat foods that are low in salt, eat baked foods instead of fried foods, eat fruit for snacks and desserts  (08/19/2009)     Activity: take a 30 minute walk every day  (06/01/2009)    Diabetes self-management support: Education handout, Resources for patients handout, Written self-care plan  (08/19/2009)   Diabetes care plan printed   Diabetes education handout printed   Last diabetes  self-management training by diabetes educator: 10/02/2008   Last medical nutrition therapy: 06/09/2008    Hypertension self-management support: Education handout, Resources for patients handout, Written self-care plan  (08/19/2009)   Hypertension self-care plan printed.   Hypertension education handout printed      Resource handout printed.   Nursing Instructions: Diabetic foot exam today     Laboratory Results   Blood Tests   Date/Time Received: August 19, 2009 11:35 AM Date/Time Reported: Alric Quan  August 19, 2009 11:35 AM   HGBA1C: 6.8%   (Normal Range: Non-Diabetic - 3-6%   Control Diabetic - 6-8%) CBG Random:: 151mg /dL

## 2010-06-10 NOTE — Progress Notes (Signed)
Summary: phone/gg  Phone Note Call from Patient   Caller: Patient Summary of Call: Pt called and wants a letter written for  medical clearence for ankle surgery.  ( Dr Lestine Box   Fax # [phone])  They are concerned about diabeties, past CVA's  and elevated heart rate.   Will she require an appointment for this? Pt # [id number] Initial call taken by: Merrie Roof RN,  Sep 23, 2009 2:43 PM  Follow-up for Phone Call        The medical clearance has been faxed on 08/19/2009. Not sure why they are asking another clearance. Follow-up by: Blondell Reveal MD,  Sep 23, 2009 3:32 PM     Appended Document: phone/gg I called Dr Lestine Box office and informed them of above

## 2010-06-10 NOTE — Assessment & Plan Note (Signed)
Summary: ACUTE-FELL AND HURT BOTH HANDS/CFB([facility])/CFB   Vital Signs:  Patient profile:   55 year old female Height:      64 inches Weight:      207.7 pounds BMI:     35.78 Temp:     98.2 degrees F oral Pulse rate:   105 / minute BP sitting:   142 / 92  (right arm)  Vitals Entered By: Filomena Jungling NT II (November 24, 2009 2:10 PM) `1CC: PATIENT FELL SUNDAY HURT BOTH HANDS AND NECK Is Patient Diabetic? Yes Did you bring your meter with you today? Yes Pain Assessment Patient in pain? yes     Location: hands Intensity: 8 Type: aching Onset of pain  FELL SUNDAY Nutritional Status BMI of > 30 = obese CBG Result 142  Have you ever been in a relationship where you felt threatened, hurt or afraid?No   Does patient need assistance? Functional Status Self care Ambulation Wheelchair Comments usually has a crutch   Primary Care Provider:  Blondell Reveal MD  CC:  PATIENT FELL SUNDAY HURT BOTH HANDS AND NECK.  History of Present Illness: Patient is a 55 year old female who presents today after a fall on Sunday.  She was getting up out of her chair when she felt that her left leg was going to give out and she fell back into the chair.  She was still gripping the table with her right hand and had her left wrist extended and elbow flexed.  Since that time she has had sharp pain with any movement of the left wrist and elbow as well as pain in her right 5th finger and right shoulder.  She has been using her usual pain medications, heat, and a wrap for her left wrist to help with the pain.  She has a past medical history of long term prednisone use for Temporal arteritis and is not taking Fosamax or calcium anymore because they were hurting her stomach.  She stopped the prednisone 2 weeks ago after a planned taper.    She is also a diabetic and is due for HgBA1C and foot exam today.  She has been taking her metformin, glipzide, and insulin without any complaints of low blood sugar.  She tests her  blood sugar several times a day and brought her meter with today.    Other preventative maintenance issues include HTN and HLD both of which are in good control and not due for laboratory testing. `  Preventive Screening-Counseling & Management  Alcohol-Tobacco     Alcohol type: AT TIMES/ HOLIDAYS  / WINE     Smoking Status: never     Passive Smoke Exposure: no  Caffeine-Diet-Exercise     Does Patient Exercise: no     Type of exercise: WALK TREADMILL     Times/week: 2  Current Medications (verified): 1)  Hydrocodone-Acetaminophen 10-325 Mg  Tabs (Hydrocodone-Acetaminophen) .... Take One Tablet 2x Daily As Needed For Pain 2)  Flexeril 10 Mg Tabs (Cyclobenzaprine Hcl) .... Take One Tablet By Mouth Every 8 Hours As Needed For Muscle Cramps 3)  Methadone Hcl 10 Mg Tabs (Methadone Hcl) .... Take 1 Tablet By Mouth 2x Times A Day 4)  Metformin Hcl 1000 Mg  Tabs (Metformin Hcl) .... Take 1 Tablet, Daily Twice, By Mouth 5)  Glipizide 5 Mg  Tabs (Glipizide) .... Take 2 Tablets By Mouth Once A Day 6)  Relion N 100 Unit/ml  Susp (Insulin Isophane Human) .... Inject 42 Units Subcutaneous in The Morning  and 26 Units At Bedtime Daily 7)  Relion R 100 Unit/ml Soln (Insulin Regular Human) .... Inject Into The Skin of Your Abdomen Using 2:50 Correction Scale Plus 1:15 Food Scale As Instructed 3 Times A Day 30 Mins Before Meals. 8)  Insulin Syringe 30g X 1/2" 1 Ml Misc (Insulin Syringe-Needle U-100) .... Use To Inject Insulin 4x Daily 9)  Freestyle Lite Test  Strp (Glucose Blood) .... Use To Checj Your Blood Sugar 3-4x Daily 10)  Lancets  Misc (Lancets) .... Use To Check Your Blood Sugar 3-4x Daily 11)  Reglan 5 Mg Tabs (Metoclopramide Hcl) .Marland Kitchen.. 1 Tablet By Mouth Before Meals and At Bedtime. 12)  Toprol Xl 50 Mg Xr24h-Tab (Metoprolol Succinate) .... Take 1 Tablet By Mouth Once A Day 13)  Effexor Xr 75 Mg Xr24h-Cap (Venlafaxine Hcl) .... Take 1 Capsule By Mouth Two Times A Day 14)  Lorazepam 0.5 Mg Tabs  (Lorazepam) .... Take One Pill As Needed For Panic Attack  Allergies (verified): 1)  ! Percocet 2)  ! Darvocet 3)  ! * Citric Acid  Past History:  Past medical, surgical, family and social histories (including risk factors) reviewed for relevance to current acute and chronic problems.  Past Medical History: Reviewed history from 12/13/2007 and no changes required. Panic disorder with agoraphobia Hypertension Frostbite of left breast, hx of (7/07) Dysfunctional uterine bleeding, hx of, s/p hysterectomy Torn tendon in L shoulder R shoulder pain - bone cracked but went into the muscle.  Broke ring toe in 2001/2002 Temporal arteritis 05/09. DM/Hyperglycemia - 06/09. Severe cystitis post vaginal hysterectomy in 2003, s/p ABx bladder instillation X2 by urology  Past Surgical History: Reviewed history from 12/19/2007 and no changes required. Hysterectomy Oophorectomy. Temporal artery biopsy on 10/11/2007. Cystoscpy and hydraulic bladder distension.  Family History: Reviewed history from 11/14/2007 and no changes required. Father died in a road accident at 44. Mother- HTN.  Social History: Reviewed history from 12/15/2006 and no changes required. Pt's phone number is [phone]  Review of Systems  The patient denies anorexia, fever, weight loss, weight gain, vision loss, decreased hearing, hoarseness, chest pain, syncope, dyspnea on exertion, peripheral edema, prolonged cough, headaches, hemoptysis, abdominal pain, melena, hematochezia, severe indigestion/heartburn, hematuria, incontinence, genital sores, muscle weakness, suspicious skin lesions, transient blindness, difficulty walking, depression, unusual weight change, abnormal bleeding, enlarged lymph nodes, angioedema, and breast masses.    Physical Exam  Additional Exam:  General: Well developed, female in no acute distress  Head: Atraumatic, normocephalic with no signs of trauma  Eyes: PERRLA, EOM intact  Ears: TM  intact  Nose: Nares patent, mucosa is pink and moist, no polyps noted  Mouth: Mucosa is pink and moist, dention,   Neck: Supple, no thyromegaly or masses noted, severely limited ROM in all movements of the neck limited by pain. Resp: Clear to ascultation bilaterally, no wheezes, rales, or rhonchi noted  CV: Regular rate and rhythm with no murmurs, rubs, or gallops noted  Abdomen: Soft, non-tender, non-distended with normal bowel sounds  Shoulder: Left shoulder is normal strength and ROM, Right shoulder has pain to palpation over the [facility] joint and tip of the acromion.  No pain over the clavicle.  There is pain over the right trapezius.  ROM is limited by pain in all planes.   Elbow: Right elbow is normal ROM and strength, Left elbow has pain to palpation in the forearm over the flexior compartment. Wrist: Right wrist has normal ROM with mild pain to palpation over the dorsal forearm.  Left wrist is severely limited ROM due to pain.  No focal tenderness over wrist joint line or carpal bones.   Hands: Left hand has decreased strength due to wrist pain, no pain to palpation of finger joints.  Right 5th finger has pain to palpation of the PIP with ROM limited by pain.   Neurologic: Alert and oriented x3, Cranial nerves II-XII grossly intact, DTR normal and symmetric  Pulses: Radial, brachial, carotid, femoral, dorsal pedis, and posterior tibial pulses equal and symmetric    Diabetes Management Exam:    Foot Exam (with socks and/or shoes not present):       Sensory-Monofilament:          Left foot: normal          Right foot: normal   Impression & Recommendations:  Problem # 1:  ACCIDENTAL FALL (ICD-E888.9) With her history of extended corticosteroid use we will image her right shoulder and hand as well as her left shoulder and wrist.  Since we are close to the 48 hour window for surgically fixing factures we will assess her x-rays as soon as we can and if there are displaced fractures we will refer  her to the [facility] ER for evaluation of surgically fixing them.    I would also like to refer her to Physical therapy because of marked muscle spasm in the shoulder and forearms.  Her neck also has very limited ROM secondary to apparent muscle spasm so she would definately benefit from upper body strengthening as well as flexibility exercises.   Orders: T-DG Shoulder*R* (53664) T-DG Elbow 2 Views*L* (40347) T-DG Wrist Complete*L* ([id number]) T-DG Hand Complete*R* (73130) Physical Therapy Referral (PT)  Problem # 2:  DM (ICD-250.00) Her HgBA1C today is 7.3% which is good control.  We will hold off any adjustment of her medications for now and encourage her to continue watching her diet and exercising as best she can.  The following medications were removed from the medication list:    Lisinopril 10 Mg Tabs (Lisinopril) .Marland Kitchen... Take 1 tablet by mouth once a day Her updated medication list for this problem includes:    Metformin Hcl 1000 Mg Tabs (Metformin hcl) .Marland Kitchen... Take 1 tablet, daily twice, by mouth    Glipizide 5 Mg Tabs (Glipizide) .Marland Kitchen... Take 2 tablets by mouth once a day    Relion N 100 Unit/ml Susp (Insulin isophane human) ..... Inject 42 units subcutaneous in the morning and 26 units at bedtime daily    Relion R 100 Unit/ml Soln (Insulin regular human) ..... Inject into the skin of your abdomen using 2:50 correction scale plus 1:15 food scale as instructed 3 times a day 30 mins before meals.  Orders: T- Capillary Blood Glucose ([id number]) T-Hgb A1C (in-house) (42595GL)  Labs Reviewed: Creat: 0.83 (07/16/2009)     Last Eye Exam: No diabetic retinopathy.    (03/26/2008) Reviewed HgBA1c results: 7.3 (11/24/2009)  6.8 (08/19/2009)  Problem # 3:  HYPERTENSION (ICD-401.9) Blood pressure today is slightly elevated but we will hold off any adjustment of her medications until she can see her PCP.  The following medications were removed from the medication list:    Lisinopril 10 Mg Tabs  (Lisinopril) .Marland Kitchen... Take 1 tablet by mouth once a day Her updated medication list for this problem includes:    Toprol Xl 50 Mg Xr24h-tab (Metoprolol succinate) .Marland Kitchen... Take 1 tablet by mouth once a day  BP today: 142/92 Prior BP: 132/79 (09/08/2009)  Labs Reviewed: K+: 3.8 (07/16/2009)  Creat: : 0.83 (07/16/2009)   Chol: 176 (01/16/2009)   HDL: 50 (01/16/2009)   LDL: 80 (01/16/2009)   TG: 232 (01/16/2009)  Complete Medication List: 1)  Hydrocodone-acetaminophen 10-325 Mg Tabs (Hydrocodone-acetaminophen) .... Take one tablet 2x daily as needed for pain 2)  Flexeril 10 Mg Tabs (Cyclobenzaprine hcl) .... Take one tablet by mouth every 8 hours as needed for muscle cramps 3)  Methadone Hcl 10 Mg Tabs (Methadone hcl) .... Take 1 tablet by mouth 2x times a day 4)  Metformin Hcl 1000 Mg Tabs (Metformin hcl) .... Take 1 tablet, daily twice, by mouth 5)  Glipizide 5 Mg Tabs (Glipizide) .... Take 2 tablets by mouth once a day 6)  Relion N 100 Unit/ml Susp (Insulin isophane human) .... Inject 42 units subcutaneous in the morning and 26 units at bedtime daily 7)  Relion R 100 Unit/ml Soln (Insulin regular human) .... Inject into the skin of your abdomen using 2:50 correction scale plus 1:15 food scale as instructed 3 times a day 30 mins before meals. 8)  Insulin Syringe 30g X 1/2" 1 Ml Misc (Insulin syringe-needle u-100) .... Use to inject insulin 4x daily 9)  Freestyle Lite Test Strp (Glucose blood) .... Use to checj your blood sugar 3-4x daily 10)  Lancets Misc (Lancets) .... Use to check your blood sugar 3-4x daily 11)  Reglan 5 Mg Tabs (Metoclopramide hcl) .Marland Kitchen.. 1 tablet by mouth before meals and at bedtime. 12)  Toprol Xl 50 Mg Xr24h-tab (Metoprolol succinate) .... Take 1 tablet by mouth once a day 13)  Effexor Xr 75 Mg Xr24h-cap (Venlafaxine hcl) .... Take 1 capsule by mouth two times a day 14)  Lorazepam 0.5 Mg Tabs (Lorazepam) .... Take one pill as needed for panic attack  Patient Instructions: 1)   Get X-rays done today. 2)  If there is a fracture that they need to fix I will call you and you will have to go to the ER for evaluation 3)  Please schedule a follow-up appointment in 1 month with your PCP.  Prevention & Chronic Care Immunizations   Influenza vaccine: Not documented   Influenza vaccine deferral: Not available  (11/24/2009)    Tetanus booster: Not documented   Td booster deferral: Deferred  (06/01/2009)    Pneumococcal vaccine: Not documented  Colorectal Screening   Hemoccult: Not documented   Hemoccult action/deferral: Deferred  (11/24/2009)    Colonoscopy: Not documented   Colonoscopy action/deferral: Deferred  (11/24/2009)  Other Screening   Pap smear: Normal  (03/29/2006)   Pap smear action/deferral: Deferred  (11/24/2009)    Mammogram: BI-RADS CATEGORY 3:  Probably benign finding(s) - short interval^MM DIGITAL DIAG LTD R  (06/04/2008)   Mammogram action/deferral: Deferred  (11/24/2009)   Smoking status: never  (11/24/2009)  Diabetes Mellitus   HgbA1C: 7.3  (11/24/2009)    Eye exam: No diabetic retinopathy.     (03/26/2008)   Diabetic eye exam action/deferral: Deferred  (11/24/2009)   Eye exam due: 10/07/2009    Foot exam: yes  (11/24/2009)   Foot exam action/deferral: Do today   High risk foot: No  (11/24/2009)   Foot care education: Done  (11/24/2009)   Foot exam due: 11/23/2010    Urine microalbumin/creatinine ratio: 5.7  (01/16/2009)   Urine microalbumin/cr due: 01/07/2010    Diabetes flowsheet reviewed?: Yes   Progress toward A1C goal: At goal  Lipids   Total Cholesterol: 176  (01/16/2009)   LDL: 80  (01/16/2009)   LDL Direct: Not documented  HDL: 50  (01/16/2009)   Triglycerides: 232  (01/16/2009)   Lipid panel due: 01/07/2010  Hypertension   Last Blood Pressure: 142 / 92  (11/24/2009)   Serum creatinine: 0.83  (07/16/2009)   Serum potassium 3.8  (07/16/2009)    Hypertension flowsheet reviewed?: Yes   Progress toward BP  goal: Unchanged  Self-Management Support :   Personal Goals (by the next clinic visit) :     Personal A1C goal: 7  (08/19/2009)     Personal blood pressure goal: 130/80  (08/19/2009)     Personal LDL goal: 70  (08/19/2009)    Patient will work on the following items until the next clinic visit to reach self-care goals:     Medications and monitoring: take my medicines every day, check my blood sugar, examine my feet every day  (11/24/2009)     Eating: drink diet soda or water instead of juice or soda, eat more vegetables, use fresh or frozen vegetables, eat foods that are low in salt, eat baked foods instead of fried foods, eat fruit for snacks and desserts, limit or avoid alcohol  (11/24/2009)     Activity: take a 30 minute walk every day  (11/24/2009)     Home glucose monitoring frequency: 3 times a day  (11/24/2009)    Diabetes self-management support: Written self-care plan  (11/24/2009)   Diabetes care plan printed   Last diabetes self-management training by diabetes educator: 10/02/2008   Last medical nutrition therapy: 06/09/2008    Hypertension self-management support: Written self-care plan  (11/24/2009)   Hypertension self-care plan printed.   Nursing Instructions: Diabetic foot exam today    Diabetic Foot Exam Foot Inspection Is there a history of a foot ulcer?              No Is there a foot ulcer now?              No Can the patient see the bottom of their feet?          Yes Are the shoes appropriate in style and fit?          Yes Is there swelling or an abnormal foot shape?          No Are the toenails long?                No Are the toenails thick?                No Are the toenails ingrown?              No Is there heavy callous build-up?              No Is there pain in the calf muscle (Intermittent claudication) when walking?    NoIs there a claw toe deformity?              No Is there elevated skin temperature?            No Is there limited ankle  dorsiflexion?            No Is there foot or ankle muscle weakness?            No  Diabetic Foot Care Education Patient educated on appropriate care of diabetic feet.  Pulse Check          Right Foot          Left Foot Posterior Tibial:        diminished  diminished Dorsalis Pedis:        diminished            diminished Comments: dry skin High Risk Feet? No Set Next Diabetic Foot Exam here: 11/23/2010   10-g (5.07) Semmes-Weinstein Monofilament Test Performed by: Filomena Jungling NT II          Right Foot          Left Foot Site 1         normal         normal Site 2         normal         normal Site 3         normal         normal Site 4         normal         normal Site 5         normal         normal Site 6         normal         normal Site 7         normal         normal Site 8         normal         normal Site 9         normal         normal  Impression      normal         normal     Laboratory Results   Blood Tests   Date/Time Received: November 24, 2009 2:32 PM  Date/Time Reported: Burke Keels  November 24, 2009 2:32 PM   HGBA1C: 7.3%   (Normal Range: Non-Diabetic - 3-6%   Control Diabetic - 6-8%) CBG Random:: 142mg /dL     Appended Document: ACUTE-FELL AND HURT BOTH HANDS/CFB([facility])/CFB    Clinical Lists Changes  Problems: Assessed ACCIDENTAL FALL as comment only - Reviewed her x-rays and they are all negative.  We will have her keep her appointment with PT to work on upper extermity flexibility and strength and reassess her as needed.       Impression & Recommendations:  Problem # 1:  ACCIDENTAL FALL (ICD-E888.9) Reviewed her x-rays and they are all negative.  We will have her keep her appointment with PT to work on upper extermity flexibility and strength and reassess her as needed.  Complete Medication List: 1)  Hydrocodone-acetaminophen 10-325 Mg Tabs (Hydrocodone-acetaminophen) .... Take one tablet 2x daily as needed for pain 2)   Flexeril 10 Mg Tabs (Cyclobenzaprine hcl) .... Take one tablet by mouth every 8 hours as needed for muscle cramps 3)  Methadone Hcl 10 Mg Tabs (Methadone hcl) .... Take 1 tablet by mouth 2x times a day 4)  Metformin Hcl 1000 Mg Tabs (Metformin hcl) .... Take 1 tablet, daily twice, by mouth 5)  Glipizide 5 Mg Tabs (Glipizide) .... Take 2 tablets by mouth once a day 6)  Relion N 100 Unit/ml Susp (Insulin isophane human) .... Inject 42 units subcutaneous in the morning and 26 units at bedtime daily 7)  Relion R 100 Unit/ml Soln (Insulin regular human) .... Inject into the skin of your abdomen using 2:50 correction scale plus 1:15 food scale as instructed 3 times a day 30 mins before meals. 8)  Insulin Syringe 30g X 1/2" 1 Ml Misc (Insulin syringe-needle u-100) .... Use to inject insulin 4x  daily 9)  Freestyle Lite Test Strp (Glucose blood) .... Use to checj your blood sugar 3-4x daily 10)  Lancets Misc (Lancets) .... Use to check your blood sugar 3-4x daily 11)  Reglan 5 Mg Tabs (Metoclopramide hcl) .Marland Kitchen.. 1 tablet by mouth before meals and at bedtime. 12)  Toprol Xl 50 Mg Xr24h-tab (Metoprolol succinate) .... Take 1 tablet by mouth once a day 13)  Effexor Xr 75 Mg Xr24h-cap (Venlafaxine hcl) .... Take 1 capsule by mouth two times a day 14)  Lorazepam 0.5 Mg Tabs (Lorazepam) .... Take one pill as needed for panic attack

## 2010-06-10 NOTE — Miscellaneous (Signed)
Summary: Appointment Canceled  Appointment status changed to canceled by [employer] on 01/19/2010 8:22 AM.  Cancellation Comments --------------------- DX.MURMUR/CARDIOLITE SAME DAY/D.MILLER  Appointment Information ----------------------- Appt Type:  CARDIOLOGY ANCILLARY VISIT      Date:  Tuesday, January 19, 2010      Time:  7:30 AM for 60 min   Urgency:  Routine   Made By:  Pearson Grippe  To Visit:  [id number]    Reason:  DX.MURMUR/CARDIOLITE SAME DAY/D.MILLER  Appt Comments ------------- -- 01/19/10 8:22: (CEMR) CANCELED -- DX.MURMUR/CARDIOLITE SAME DAY/D.MILLER -- 01/04/10 11:07: (CEMR) BOOKED -- Routine CARDIOLOGY ANCILLARY VISIT at 01/19/2010 7:30 AM for 60 min DX.MURMUR/CARDIOLITE SAME DAY/D.MILLER

## 2010-06-10 NOTE — Progress Notes (Signed)
Summary: med refill/gp  Phone Note Refill Request Message from:  Fax from Pharmacy  Refills Requested: Medication #1:  PREDNISONE 10 MG TABS 1tabqd   Last Refilled: 10/12/2009  Method Requested: Electronic Initial call taken by: Chinita Pester RN,  March 01, 2010 3:35 PM  Follow-up for Phone Call       Follow-up by: Blondell Reveal MD,  March 01, 2010 3:38 PM    New/Updated Medications: PREDNISONE 10 MG TABS (PREDNISONE) Take 1 tablet by mouth once a day Prescriptions: PREDNISONE 10 MG TABS (PREDNISONE) Take 1 tablet by mouth once a day  #30 x 2   Entered and Authorized by:   Blondell Reveal MD   Signed by:   Blondell Reveal MD on 03/01/2010   Method used:   Electronically to        [facility] (retail)       [address]       [place], [place]  [address]       Ph: [phone]       Fax: [phone]   RxID:   [id number]

## 2010-06-10 NOTE — Progress Notes (Signed)
Summary: phone/gg  Phone Note Call from Patient   Caller: Patient Summary of Call: Pt called and needs medical clearence for orthopedic surgery on left ankle.  Dr Lestine Box. You saw her last 06/01/09.   Do you want her seen for this or  can you do it without exam? Pt # [phone] Initial call taken by: Merrie Roof RN,  August 10, 2009 3:04 PM  Follow-up for Phone Call        please make an appointment with me or other physicians for the clearance. Follow-up by: Blondell Reveal MD,  August 10, 2009 9:27 PM     Appended Document: phone/gg appointment scheduled

## 2010-06-10 NOTE — Assessment & Plan Note (Signed)
Summary: Cardiology Nuclear Testing  Nuclear Med Background Indications for Stress Test: Evaluation for Ischemia, Surgical Clearance  Indications Comments: Pending (L) ankle surgery  History: Echo  History Comments: 02/02/10 Echo:normal  Symptoms: Chest Tightness, Chest Tightness with Exertion, Diaphoresis, Dizziness, DOE, Fatigue, Nausea, Near Syncope, Rapid HR, Vomiting  Symptoms Comments: Last episode of CP:2 weeks ago.   Nuclear Pre-Procedure Cardiac Risk Factors: Hypertension, IDDM Type 2, Obesity Caffeine/Decaff Intake: none NPO After: 7:30 PM Lungs: Clear.  O2 Sat 94% on RA. IV 0.9% NS with Angio Cath: 22g     IV Site: R Hand IV Started by: Cathlyn Parsons, RN Chest Size (in) 36     Cup Size D     Height (in): 64 Weight (lb): 209 BMI: 36.00 Tech Comments: TOPROL HELD X 24 HOURS PT TOOK GLIPIZIDE THIS AM, NO INSULIN OR METFORMIN. GLUCOSE THIS AM 151.  Nuclear Med Study 1 or 2 day study:  1 day     Stress Test Type:  Stress Reading MD:  Willa Rough, MD     Referring MD:  Dietrich Pates, MD Resting Radionuclide:  Technetium 21m Tetrofosmin     Resting Radionuclide Dose:  11.0 mCi  Stress Radionuclide:  Technetium 69m Tetrofosmin     Stress Radionuclide Dose:  33.0 mCi   Stress Protocol   Lexiscan: 0.4 mg   Stress Test Technologist:  [employer]a [employer], CMA-N     Nuclear Technologist:  Domenic Polite, CNMT  Rest Procedure  Myocardial perfusion imaging was performed at rest 45 minutes following the intravenous administration of Technetium 24m Tetrofosmin.  Stress Procedure  The patient received IV Lexiscan 0.4 mg over 15-seconds.  Technetium 66m Tetrofosmin injected at 30-seconds.  There were no significant changes with infusion.  Quantitative spect images were obtained after a 45 minute delay.  QPS Raw Data Images:  Patient motion noted; appropriate software correction applied. Stress Images:  No diagnostic abnormalities Rest Images:  Same as stress Subtraction (SDS):   No evidence of ischemia. Transient Ischemic Dilatation:  .98  (Normal <1.22)  Lung/Heart Ratio:  .27  (Normal <0.45)  Quantitative Gated Spect Images QGS EDV:  60 ml QGS ESV:  25 ml QGS EF:  59 % QGS cine images:  Normal motion  Findings Normal nuclear study      Overall Impression  Exercise Capacity: Lexiscan with no exercise. BP Response: Normal blood pressure response. Clinical Symptoms: lightheaded ECG Impression: No significant ST segment change suggestive of ischemia. Overall Impression: Normal stress nuclear study.  Appended Document: Cardiology Nuclear Testing Normal myoview.  No evidence of ischemia to explain SOB.  Low risk, ok to proceed with surgery.  Appended Document: Cardiology Nuclear Testing Normal stress myoview.  NO ischemia.  Appended Document: Cardiology Nuclear Testing Called patient and she is aware of results.

## 2010-06-10 NOTE — Progress Notes (Signed)
Summary: Nuclear Pre-Procedure  Phone Note Outgoing Call   Call placed by: Milana Na, EMT-P,  February 01, 2010 12:58 PM Summary of Call: Left message with information on [employer] Information Sheet (see scanned document for details). Left message on the husband's cell phone.     Nuclear Med Background Indications for Stress Test: Evaluation for Ischemia, Surgical Clearance   History: Echo  History Comments: 05/11 ECHO NL  Symptoms: DOE    Nuclear Pre-Procedure Cardiac Risk Factors: Hypertension, IDDM Type 2 Height (in): 64  Nuclear Med Study Referring MD:  P.Ross

## 2010-06-10 NOTE — Progress Notes (Signed)
Summary: med refill/gp  Phone Note Refill Request Message from:  Fax from Pharmacy on March 17, 2010 10:19 AM  Refills Requested: Medication #1:  RELION N 100 UNIT/ML  SUSP inject 42 units subcutaneous in the morning and 26 units at bedtime daily   Last Refilled: 01/07/2010  Method Requested: Electronic Initial call taken by: Chinita Pester RN,  March 17, 2010 10:20 AM  Follow-up for Phone Call       Follow-up by: Blondell Reveal MD,  March 17, 2010 3:14 PM    Prescriptions: RELION N 100 UNIT/ML  SUSP (INSULIN ISOPHANE HUMAN) inject 42 units subcutaneous in the morning and 26 units at bedtime daily  #1 month supp x 11   Entered and Authorized by:   Blondell Reveal MD   Signed by:   Blondell Reveal MD on 03/17/2010   Method used:   Electronically to        [facility] (retail)       [address]       [place], [place]  [address]       Ph: [phone]       Fax: [phone]   RxID:   [id number]

## 2010-06-10 NOTE — Progress Notes (Signed)
Summary: med refill/gp  Phone Note Refill Request Message from:  Fax from Pharmacy on March 16, 2010 12:38 PM  Refills Requested: Medication #1:  RELION N 100 UNIT/ML  SUSP inject 42 units subcutaneous in the morning and 26 units at bedtime daily   Last Refilled: 01/07/2010  Method Requested: Electronic Initial call taken by: Chinita Pester RN,  March 16, 2010 12:38 PM  Follow-up for Phone Call       Follow-up by: Blondell Reveal MD,  March 17, 2010 7:24 AM    Prescriptions: RELION R 100 UNIT/ML SOLN (INSULIN REGULAR HUMAN) Inject into the skin of your abdomen using 2:50 correction scale plus 1:15 food scale as instructed 3 times a day 30 mins before meals.  #2 vials x 11   Entered and Authorized by:   Blondell Reveal MD   Signed by:   Blondell Reveal MD on 03/17/2010   Method used:   Electronically to        [facility] (retail)       [address]       [place], [place]  [address]       Ph: [phone]       Fax: [phone]   RxID:   [id number]

## 2010-06-10 NOTE — Progress Notes (Signed)
Summary: phone/gg  Phone Note Call from Patient   Caller: Patient Summary of Call: Pt called asking for appointment for pain from feet to head.  she was treated last week in the ED for  this pain.  She was given injection for pain.  She states she is unable to get up and walk around without help.  This has been going on for a few weeks. Will see today. Initial call taken by: Merrie Roof RN,  March 08, 2010 11:06 AM  Follow-up for Phone Call        Agree with the plan. Will address her pains at her office visit. Follow-up by: Blondell Reveal MD,  March 08, 2010 12:00 PM

## 2010-06-10 NOTE — Consult Note (Signed)
Summary: [facility]  [facility]   Imported By: Margie Billet 04/26/2010 14:58:08  _____________________________________________________________________  External Attachment:    Type:   Image     Comment:   External Document

## 2010-06-10 NOTE — Miscellaneous (Signed)
Summary: Appointment Canceled  Appointment status changed to canceled by [employer] on 01/19/2010 8:22 AM.  Cancellation Comments --------------------- ADENOSINE R/S CVARDIOLITE/DX.CHEST PAIN/ECHO SAME DAY/D.MILLER  Appointment Information ----------------------- Appt Type:  CARDIOLOGY NUCLEAR TESTING      Date:  Tuesday, January 19, 2010      Time:  7:15 AM for 15 min   Urgency:  Routine   Made By:  Pearson Grippe  To Visit:  [id number]    Reason:  ADENOSINE R/S CVARDIOLITE/DX.CHEST PAIN/ECHO SAME DAY/D.MILLER  Appt Comments ------------- -- 01/19/10 8:22: ([facility]) CANCELED -- ADENOSINE R/S CVARDIOLITE/DX.CHEST PAIN/ECHO SAME DAY/D.MILLER -- 01/04/10 11:07: ([facility]) BOOKED -- Routine CARDIOLOGY NUCLEAR TESTING at 01/19/2010 7:15 AM for 15 min ADENOSINE R/S CVARDIOLITE/DX.CHEST PAIN/ECHO SAME

## 2010-06-10 NOTE — Progress Notes (Signed)
Summary: refill/gg  Phone Note Refill Request   Refills Requested: Medication #1:  FREESTYLE LITE TEST  STRP use to checj your blood sugar 3-4x daily  Medication #2:  INSULIN SYRINGE 30G X 1/2" 1 ML MISC use to inject insulin 4x daily  Method Requested: Electronic Initial call taken by: Merrie Roof RN,  May 18, 2010 12:12 PM  Follow-up for Phone Call       Follow-up by: Blondell Reveal MD,  May 18, 2010 12:23 PM    Prescriptions: INSULIN SYRINGE 30G X 1/2" 1 ML MISC (INSULIN SYRINGE-NEEDLE U-100) use to inject insulin 4x daily  #200 x 11   Entered and Authorized by:   Blondell Reveal MD   Signed by:   Blondell Reveal MD on 05/18/2010   Method used:   Electronically to        [facility] (retail)       [address]       [place], [place]  [address]       Ph: [phone]       Fax: [phone]   RxID:   [id number] FREESTYLE LITE TEST  STRP (GLUCOSE BLOOD) use to checj your blood sugar 3-4x daily  #150 x 11   Entered and Authorized by:   Blondell Reveal MD   Signed by:   Blondell Reveal MD on 05/18/2010   Method used:   Electronically to        [facility] (retail)       [address]       [place], [place]  [address]       Ph: [phone]       Fax: [phone]   RxID:   [id number]

## 2010-06-10 NOTE — Letter (Signed)
Summary: [facility]  [facility]   Imported By: Shon Hough 11/30/2009 15:53:04  _____________________________________________________________________  External Attachment:    Type:   Image     Comment:   External Document

## 2010-06-10 NOTE — Letter (Signed)
Summary: Appointment - Missed  [facility], Main Office  [address]   [place], [place] [address]   Phone: [phone]  Fax: [phone]     Oct 02, 2009 MRN: [id number]   Morgan Wilkerson [address] [place], [place]  [address]   Dear [name],  Our records indicate you missed your appointment on 09/28/2009 with Dr. Tenny Craw.                                    It is very important that we reach you to reschedule this appointment. We look forward to participating in your health care needs. Please contact us at the number listed above at your earliest convenience to reschedule this appointment.     Sincerely,   Migdalia Dk [facility] Scheduling Team

## 2010-07-05 ENCOUNTER — Ambulatory Visit: Payer: Medicare Other | Admitting: Physical Medicine & Rehabilitation

## 2010-07-05 ENCOUNTER — Encounter: Payer: Medicare Other | Attending: Physical Medicine & Rehabilitation

## 2010-07-09 ENCOUNTER — Other Ambulatory Visit: Payer: Self-pay | Admitting: *Deleted

## 2010-07-10 MED ORDER — LORAZEPAM 0.5 MG PO TABS: ORAL_TABLET | ORAL | Status: DC

## 2010-07-21 LAB — GLUCOSE, CAPILLARY: Glucose-Capillary: 102 mg/dL — ABNORMAL HIGH (ref 70–99)

## 2010-07-24 LAB — GLUCOSE, CAPILLARY: Glucose-Capillary: 142 mg/dL — ABNORMAL HIGH (ref 70–99)

## 2010-08-04 ENCOUNTER — Encounter: Payer: Medicare Other | Attending: Physical Medicine & Rehabilitation

## 2010-08-04 ENCOUNTER — Ambulatory Visit: Payer: Medicare Other | Admitting: Physical Medicine & Rehabilitation

## 2010-08-06 ENCOUNTER — Ambulatory Visit: Payer: Medicare Other | Admitting: Physical Medicine & Rehabilitation

## 2010-08-06 ENCOUNTER — Encounter: Payer: Medicare Other | Attending: Physical Medicine & Rehabilitation

## 2010-08-06 DIAGNOSIS — M75100 Unspecified rotator cuff tear or rupture of unspecified shoulder, not specified as traumatic: Secondary | ICD-10-CM | POA: Insufficient documentation

## 2010-08-06 DIAGNOSIS — IMO0001 Reserved for inherently not codable concepts without codable children: Secondary | ICD-10-CM

## 2010-08-06 DIAGNOSIS — M7520 Bicipital tendinitis, unspecified shoulder: Secondary | ICD-10-CM

## 2010-08-06 DIAGNOSIS — S93499A Sprain of other ligament of unspecified ankle, initial encounter: Secondary | ICD-10-CM | POA: Insufficient documentation

## 2010-08-06 DIAGNOSIS — Z79899 Other long term (current) drug therapy: Secondary | ICD-10-CM | POA: Insufficient documentation

## 2010-08-06 DIAGNOSIS — M19019 Primary osteoarthritis, unspecified shoulder: Secondary | ICD-10-CM | POA: Insufficient documentation

## 2010-08-06 DIAGNOSIS — S93409A Sprain of unspecified ligament of unspecified ankle, initial encounter: Secondary | ICD-10-CM

## 2010-08-06 DIAGNOSIS — X58XXXA Exposure to other specified factors, initial encounter: Secondary | ICD-10-CM | POA: Insufficient documentation

## 2010-08-06 DIAGNOSIS — M316 Other giant cell arteritis: Secondary | ICD-10-CM | POA: Insufficient documentation

## 2010-08-06 DIAGNOSIS — M719 Bursopathy, unspecified: Secondary | ICD-10-CM | POA: Insufficient documentation

## 2010-08-06 DIAGNOSIS — M7530 Calcific tendinitis of unspecified shoulder: Secondary | ICD-10-CM

## 2010-08-06 DIAGNOSIS — M67919 Unspecified disorder of synovium and tendon, unspecified shoulder: Secondary | ICD-10-CM | POA: Insufficient documentation

## 2010-08-06 DIAGNOSIS — IMO0002 Reserved for concepts with insufficient information to code with codable children: Secondary | ICD-10-CM | POA: Insufficient documentation

## 2010-08-09 ENCOUNTER — Other Ambulatory Visit: Payer: Self-pay | Admitting: *Deleted

## 2010-08-11 MED ORDER — PREDNISONE 10 MG PO TABS: 10.0000 mg | ORAL_TABLET | Freq: Every day | ORAL | Status: DC

## 2010-08-11 NOTE — Telephone Encounter (Signed)
Pt must come in for an appt.  Per Dr. Bernita Buffy last note, she should no longer be on chronic prednisone and was referred to rheumatology for further recommendations re: her temporal arteritis.  I will not refill this again.  Please have her come in to address this issue a/s/a/p as it is dangerous to suddenly stop steroid tx without a proper taper.  Thank you!

## 2010-08-12 LAB — DIFFERENTIAL
Basophils Absolute: 0.1 10*3/uL (ref 0.0–0.1)
Basophils Relative: 0 % (ref 0–1)
Eosinophils Relative: 0 % (ref 0–5)
Monocytes Absolute: 0.7 10*3/uL (ref 0.1–1.0)
Neutro Abs: 7.9 10*3/uL — ABNORMAL HIGH (ref 1.7–7.7)

## 2010-08-12 LAB — CBC
HCT: 28.9 % — ABNORMAL LOW (ref 36.0–46.0)
HCT: 28.9 % — ABNORMAL LOW (ref 36.0–46.0)
HCT: 30.5 % — ABNORMAL LOW (ref 36.0–46.0)
HCT: 33 % — ABNORMAL LOW (ref 36.0–46.0)
Hemoglobin: 10 g/dL — ABNORMAL LOW (ref 12.0–15.0)
Hemoglobin: 10.3 g/dL — ABNORMAL LOW (ref 12.0–15.0)
Hemoglobin: 10.7 g/dL — ABNORMAL LOW (ref 12.0–15.0)
Hemoglobin: 9.5 g/dL — ABNORMAL LOW (ref 12.0–15.0)
Hemoglobin: 9.6 g/dL — ABNORMAL LOW (ref 12.0–15.0)
Hemoglobin: 9.6 g/dL — ABNORMAL LOW (ref 12.0–15.0)
Hemoglobin: 9.8 g/dL — ABNORMAL LOW (ref 12.0–15.0)
Hemoglobin: 9.9 g/dL — ABNORMAL LOW (ref 12.0–15.0)
MCHC: 32.4 g/dL (ref 30.0–36.0)
MCHC: 33 g/dL (ref 30.0–36.0)
MCV: 89.8 fL (ref 78.0–100.0)
MCV: 90 fL (ref 78.0–100.0)
Platelets: 302 10*3/uL (ref 150–400)
Platelets: 314 10*3/uL (ref 150–400)
Platelets: 319 10*3/uL (ref 150–400)
RBC: 3.22 MIL/uL — ABNORMAL LOW (ref 3.87–5.11)
RBC: 3.22 MIL/uL — ABNORMAL LOW (ref 3.87–5.11)
RBC: 3.35 MIL/uL — ABNORMAL LOW (ref 3.87–5.11)
RBC: 3.37 MIL/uL — ABNORMAL LOW (ref 3.87–5.11)
RBC: 3.49 MIL/uL — ABNORMAL LOW (ref 3.87–5.11)
RDW: 17.7 % — ABNORMAL HIGH (ref 11.5–15.5)
RDW: 17.8 % — ABNORMAL HIGH (ref 11.5–15.5)
RDW: 18 % — ABNORMAL HIGH (ref 11.5–15.5)
RDW: 18.4 % — ABNORMAL HIGH (ref 11.5–15.5)
RDW: 19 % — ABNORMAL HIGH (ref 11.5–15.5)
RDW: 19.3 % — ABNORMAL HIGH (ref 11.5–15.5)
RDW: 19.3 % — ABNORMAL HIGH (ref 11.5–15.5)
WBC: 13.6 10*3/uL — ABNORMAL HIGH (ref 4.0–10.5)
WBC: 14.9 10*3/uL — ABNORMAL HIGH (ref 4.0–10.5)
WBC: 9.9 10*3/uL (ref 4.0–10.5)

## 2010-08-12 LAB — BASIC METABOLIC PANEL
BUN: 2 mg/dL — ABNORMAL LOW (ref 6–23)
BUN: 3 mg/dL — ABNORMAL LOW (ref 6–23)
BUN: 3 mg/dL — ABNORMAL LOW (ref 6–23)
BUN: 3 mg/dL — ABNORMAL LOW (ref 6–23)
CO2: 24 mEq/L (ref 19–32)
CO2: 24 mEq/L (ref 19–32)
CO2: 28 mEq/L (ref 19–32)
CO2: 29 mEq/L (ref 19–32)
Calcium: 8.5 mg/dL (ref 8.4–10.5)
Calcium: 8.6 mg/dL (ref 8.4–10.5)
Calcium: 8.6 mg/dL (ref 8.4–10.5)
Calcium: 8.9 mg/dL (ref 8.4–10.5)
Calcium: 9.5 mg/dL (ref 8.4–10.5)
Chloride: 101 mEq/L (ref 96–112)
Chloride: 102 mEq/L (ref 96–112)
Chloride: 104 mEq/L (ref 96–112)
Chloride: 106 mEq/L (ref 96–112)
Chloride: 97 mEq/L (ref 96–112)
Creatinine, Ser: 0.67 mg/dL (ref 0.4–1.2)
Creatinine, Ser: 0.71 mg/dL (ref 0.4–1.2)
Creatinine, Ser: 0.75 mg/dL (ref 0.4–1.2)
GFR calc Af Amer: >60 mL/min (ref >60)
GFR calc Af Amer: >60 mL/min (ref >60)
GFR calc Af Amer: >60 mL/min (ref >60)
GFR calc Af Amer: >60 mL/min (ref >60)
GFR calc non Af Amer: >60 mL/min (ref >60)
GFR calc non Af Amer: >60 mL/min (ref >60)
GFR calc non Af Amer: >60 mL/min (ref >60)
GFR calc non Af Amer: >60 mL/min (ref >60)
GFR calc non Af Amer: >60 mL/min (ref >60)
GFR calc non Af Amer: >60 mL/min (ref >60)
GFR calc non Af Amer: >60 mL/min (ref >60)
GFR calc non Af Amer: >60 mL/min (ref >60)
Glucose, Bld: 124 mg/dL — ABNORMAL HIGH (ref 70–99)
Glucose, Bld: 130 mg/dL — ABNORMAL HIGH (ref 70–99)
Glucose, Bld: 138 mg/dL — ABNORMAL HIGH (ref 70–99)
Glucose, Bld: 138 mg/dL — ABNORMAL HIGH (ref 70–99)
Glucose, Bld: 141 mg/dL — ABNORMAL HIGH (ref 70–99)
Glucose, Bld: 141 mg/dL — ABNORMAL HIGH (ref 70–99)
Glucose, Bld: 146 mg/dL — ABNORMAL HIGH (ref 70–99)
Glucose, Bld: 153 mg/dL — ABNORMAL HIGH (ref 70–99)
Glucose, Bld: 201 mg/dL — ABNORMAL HIGH (ref 70–99)
Glucose, Bld: 300 mg/dL — ABNORMAL HIGH (ref 70–99)
Potassium: 3 mEq/L — ABNORMAL LOW (ref 3.5–5.1)
Potassium: 3.1 mEq/L — ABNORMAL LOW (ref 3.5–5.1)
Potassium: 3.1 mEq/L — ABNORMAL LOW (ref 3.5–5.1)
Potassium: 3.2 mEq/L — ABNORMAL LOW (ref 3.5–5.1)
Potassium: 3.4 mEq/L — ABNORMAL LOW (ref 3.5–5.1)
Potassium: 4 mEq/L (ref 3.5–5.1)
Sodium: 135 mEq/L (ref 135–145)
Sodium: 136 mEq/L (ref 135–145)
Sodium: 136 mEq/L (ref 135–145)
Sodium: 137 mEq/L (ref 135–145)
Sodium: 137 mEq/L (ref 135–145)
Sodium: 138 mEq/L (ref 135–145)
Sodium: 138 mEq/L (ref 135–145)
Sodium: 139 mEq/L (ref 135–145)
Sodium: 139 mEq/L (ref 135–145)
Sodium: 142 mEq/L (ref 135–145)

## 2010-08-12 LAB — GLUCOSE, CAPILLARY
Glucose-Capillary: 115 mg/dL — ABNORMAL HIGH (ref 70–99)
Glucose-Capillary: 126 mg/dL — ABNORMAL HIGH (ref 70–99)
Glucose-Capillary: 127 mg/dL — ABNORMAL HIGH (ref 70–99)
Glucose-Capillary: 136 mg/dL — ABNORMAL HIGH (ref 70–99)
Glucose-Capillary: 141 mg/dL — ABNORMAL HIGH (ref 70–99)
Glucose-Capillary: 142 mg/dL — ABNORMAL HIGH (ref 70–99)
Glucose-Capillary: 142 mg/dL — ABNORMAL HIGH (ref 70–99)
Glucose-Capillary: 145 mg/dL — ABNORMAL HIGH (ref 70–99)
Glucose-Capillary: 150 mg/dL — ABNORMAL HIGH (ref 70–99)
Glucose-Capillary: 154 mg/dL — ABNORMAL HIGH (ref 70–99)
Glucose-Capillary: 158 mg/dL — ABNORMAL HIGH (ref 70–99)
Glucose-Capillary: 207 mg/dL — ABNORMAL HIGH (ref 70–99)
Glucose-Capillary: 218 mg/dL — ABNORMAL HIGH (ref 70–99)
Glucose-Capillary: 231 mg/dL — ABNORMAL HIGH (ref 70–99)
Glucose-Capillary: 231 mg/dL — ABNORMAL HIGH (ref 70–99)
Glucose-Capillary: 245 mg/dL — ABNORMAL HIGH (ref 70–99)
Glucose-Capillary: 259 mg/dL — ABNORMAL HIGH (ref 70–99)
Glucose-Capillary: 362 mg/dL — ABNORMAL HIGH (ref 70–99)

## 2010-08-12 LAB — CARDIAC PANEL(CRET KIN+CKTOT+MB+TROPI)
CK, MB: 0.5 ng/mL (ref 0.3–4.0)
CK, MB: 0.6 ng/mL (ref 0.3–4.0)
CK, MB: 0.6 ng/mL (ref 0.3–4.0)
CK, MB: 0.7 ng/mL (ref 0.3–4.0)
Relative Index: INVALID (ref 0.0–2.5)
Relative Index: INVALID (ref 0.0–2.5)
Total CK: 36 U/L (ref 7–177)
Total CK: 41 U/L (ref 7–177)
Total CK: 43 U/L (ref 7–177)
Total CK: 49 U/L (ref 7–177)
Total CK: 51 U/L (ref 7–177)
Total CK: 66 U/L (ref 7–177)
Troponin I: 0.05 ng/mL (ref 0.00–0.06)

## 2010-08-12 LAB — PROTIME-INR
INR: 1.1 (ref 0.00–1.49)
Prothrombin Time: 14.1 seconds (ref 11.6–15.2)

## 2010-08-12 LAB — COMPREHENSIVE METABOLIC PANEL
Albumin: 2.8 g/dL — ABNORMAL LOW (ref 3.5–5.2)
Alkaline Phosphatase: 196 U/L — ABNORMAL HIGH (ref 39–117)
BUN: 5 mg/dL — ABNORMAL LOW (ref 6–23)
Calcium: 8.9 mg/dL (ref 8.4–10.5)
Potassium: 3.1 mEq/L — ABNORMAL LOW (ref 3.5–5.1)
Sodium: 136 mEq/L (ref 135–145)
Total Protein: 6.3 g/dL (ref 6.0–8.3)

## 2010-08-12 LAB — HEPATIC FUNCTION PANEL
ALT: 69 U/L — ABNORMAL HIGH (ref 0–35)
AST: 166 U/L — ABNORMAL HIGH (ref 0–37)
Alkaline Phosphatase: 200 U/L — ABNORMAL HIGH (ref 39–117)
Bilirubin, Direct: 0.2 mg/dL (ref 0.0–0.3)
Indirect Bilirubin: 0.7 mg/dL (ref 0.3–0.9)
Total Bilirubin: 0.9 mg/dL (ref 0.3–1.2)

## 2010-08-13 LAB — CBC
HCT: 25 % — ABNORMAL LOW (ref 36.0–46.0)
HCT: 28.9 % — ABNORMAL LOW (ref 36.0–46.0)
HCT: 39.6 % (ref 36.0–46.0)
Hemoglobin: 12.9 g/dL (ref 12.0–15.0)
Hemoglobin: 9.1 g/dL — ABNORMAL LOW (ref 12.0–15.0)
Hemoglobin: 9.4 g/dL — ABNORMAL LOW (ref 12.0–15.0)
MCHC: 32.3 g/dL (ref 30.0–36.0)
MCHC: 32.5 g/dL (ref 30.0–36.0)
MCHC: 32.7 g/dL (ref 30.0–36.0)
MCV: 89.1 fL (ref 78.0–100.0)
MCV: 89.4 fL (ref 78.0–100.0)
Platelets: 187 10*3/uL (ref 150–400)
Platelets: 189 10*3/uL (ref 150–400)
Platelets: 212 10*3/uL (ref 150–400)
Platelets: 317 10*3/uL (ref 150–400)
RBC: 2.8 MIL/uL — ABNORMAL LOW (ref 3.87–5.11)
RBC: 2.87 MIL/uL — ABNORMAL LOW (ref 3.87–5.11)
RBC: 3.17 MIL/uL — ABNORMAL LOW (ref 3.87–5.11)
RBC: 3.24 MIL/uL — ABNORMAL LOW (ref 3.87–5.11)
RBC: 4.43 MIL/uL (ref 3.87–5.11)
RDW: 16 % — ABNORMAL HIGH (ref 11.5–15.5)
RDW: 16.1 % — ABNORMAL HIGH (ref 11.5–15.5)
RDW: 17.2 % — ABNORMAL HIGH (ref 11.5–15.5)
RDW: 17.4 % — ABNORMAL HIGH (ref 11.5–15.5)
WBC: 10.3 10*3/uL (ref 4.0–10.5)
WBC: 6.4 10*3/uL (ref 4.0–10.5)
WBC: 6.9 10*3/uL (ref 4.0–10.5)
WBC: 8.9 10*3/uL (ref 4.0–10.5)
WBC: 9.5 10*3/uL (ref 4.0–10.5)
WBC: 9.8 10*3/uL (ref 4.0–10.5)

## 2010-08-13 LAB — IRON AND TIBC
Saturation Ratios: 20 % (ref 20–55)
TIBC: 167 ug/dL — ABNORMAL LOW (ref 250–470)

## 2010-08-13 LAB — URINALYSIS, ROUTINE W REFLEX MICROSCOPIC
Glucose, UA: 100 mg/dL — AB
Glucose, UA: NEGATIVE mg/dL
Hgb urine dipstick: NEGATIVE
Ketones, ur: 15 mg/dL — AB
Protein, ur: 100 mg/dL — AB
Protein, ur: NEGATIVE mg/dL
Specific Gravity, Urine: 1.008 (ref 1.005–1.030)
Urobilinogen, UA: 0.2 mg/dL (ref 0.0–1.0)
Urobilinogen, UA: 1 mg/dL (ref 0.0–1.0)

## 2010-08-13 LAB — BASIC METABOLIC PANEL
BUN: 3 mg/dL — ABNORMAL LOW (ref 6–23)
BUN: 6 mg/dL (ref 6–23)
BUN: <1 mg/dL — ABNORMAL LOW (ref 6–23)
BUN: <1 mg/dL — ABNORMAL LOW (ref 6–23)
CO2: 21 mEq/L (ref 19–32)
CO2: 22 mEq/L (ref 19–32)
CO2: 26 mEq/L (ref 19–32)
CO2: 26 mEq/L (ref 19–32)
Calcium: 7.8 mg/dL — ABNORMAL LOW (ref 8.4–10.5)
Calcium: 7.9 mg/dL — ABNORMAL LOW (ref 8.4–10.5)
Calcium: 8.1 mg/dL — ABNORMAL LOW (ref 8.4–10.5)
Calcium: 8.3 mg/dL — ABNORMAL LOW (ref 8.4–10.5)
Calcium: 8.3 mg/dL — ABNORMAL LOW (ref 8.4–10.5)
Calcium: 8.4 mg/dL (ref 8.4–10.5)
Chloride: 102 mEq/L (ref 96–112)
Chloride: 103 mEq/L (ref 96–112)
Chloride: 103 mEq/L (ref 96–112)
Chloride: 108 mEq/L (ref 96–112)
Creatinine, Ser: 0.63 mg/dL (ref 0.4–1.2)
Creatinine, Ser: 0.69 mg/dL (ref 0.4–1.2)
Creatinine, Ser: 0.72 mg/dL (ref 0.4–1.2)
Creatinine, Ser: 0.77 mg/dL (ref 0.4–1.2)
Creatinine, Ser: 0.77 mg/dL (ref 0.4–1.2)
Creatinine, Ser: 0.81 mg/dL (ref 0.4–1.2)
GFR calc Af Amer: >60 mL/min (ref >60)
GFR calc Af Amer: >60 mL/min (ref >60)
GFR calc Af Amer: >60 mL/min (ref >60)
GFR calc Af Amer: >60 mL/min (ref >60)
GFR calc non Af Amer: >60 mL/min (ref >60)
GFR calc non Af Amer: >60 mL/min (ref >60)
GFR calc non Af Amer: >60 mL/min (ref >60)
GFR calc non Af Amer: >60 mL/min (ref >60)
GFR calc non Af Amer: >60 mL/min (ref >60)
Glucose, Bld: 105 mg/dL — ABNORMAL HIGH (ref 70–99)
Glucose, Bld: 125 mg/dL — ABNORMAL HIGH (ref 70–99)
Glucose, Bld: 142 mg/dL — ABNORMAL HIGH (ref 70–99)
Potassium: 3.5 mEq/L (ref 3.5–5.1)
Potassium: 3.7 mEq/L (ref 3.5–5.1)
Potassium: 4.3 mEq/L (ref 3.5–5.1)
Sodium: 135 mEq/L (ref 135–145)
Sodium: 137 mEq/L (ref 135–145)
Sodium: 141 mEq/L (ref 135–145)

## 2010-08-13 LAB — RAPID URINE DRUG SCREEN, HOSP PERFORMED
Amphetamines: NOT DETECTED
Benzodiazepines: NOT DETECTED
Cocaine: NOT DETECTED
Tetrahydrocannabinol: NOT DETECTED

## 2010-08-13 LAB — COMPREHENSIVE METABOLIC PANEL
ALT: 80 U/L — ABNORMAL HIGH (ref 0–35)
AST: 68 U/L — ABNORMAL HIGH (ref 0–37)
Albumin: 2.8 g/dL — ABNORMAL LOW (ref 3.5–5.2)
Alkaline Phosphatase: 153 U/L — ABNORMAL HIGH (ref 39–117)
Alkaline Phosphatase: 58 U/L (ref 39–117)
BUN: 5 mg/dL — ABNORMAL LOW (ref 6–23)
BUN: 6 mg/dL (ref 6–23)
CO2: 27 mEq/L (ref 19–32)
Chloride: 94 mEq/L — ABNORMAL LOW (ref 96–112)
Chloride: 95 mEq/L — ABNORMAL LOW (ref 96–112)
GFR calc Af Amer: >60 mL/min (ref >60)
GFR calc non Af Amer: >60 mL/min (ref >60)
Glucose, Bld: 128 mg/dL — ABNORMAL HIGH (ref 70–99)
Potassium: 3.3 mEq/L — ABNORMAL LOW (ref 3.5–5.1)
Potassium: 3.9 mEq/L (ref 3.5–5.1)
Sodium: 135 mEq/L (ref 135–145)
Total Bilirubin: 1 mg/dL (ref 0.3–1.2)
Total Bilirubin: 1.1 mg/dL (ref 0.3–1.2)
Total Protein: 6.5 g/dL (ref 6.0–8.3)

## 2010-08-13 LAB — TROPONIN I: Troponin I: 0.02 ng/mL (ref 0.00–0.06)

## 2010-08-13 LAB — DIFFERENTIAL
Basophils Absolute: 0 10*3/uL (ref 0.0–0.1)
Basophils Absolute: 0.2 10*3/uL — ABNORMAL HIGH (ref 0.0–0.1)
Basophils Relative: 0 % (ref 0–1)
Basophils Relative: 2 % — ABNORMAL HIGH (ref 0–1)
Eosinophils Absolute: 0 10*3/uL (ref 0.0–0.7)
Eosinophils Relative: 0 % (ref 0–5)
Lymphocytes Relative: 24 % (ref 12–46)
Monocytes Absolute: 0.7 10*3/uL (ref 0.1–1.0)
Monocytes Relative: 4 % (ref 3–12)
Monocytes Relative: 7 % (ref 3–12)
Neutro Abs: 6.5 10*3/uL (ref 1.7–7.7)
Neutro Abs: 8 10*3/uL — ABNORMAL HIGH (ref 1.7–7.7)
Neutrophils Relative %: 65 % (ref 43–77)

## 2010-08-13 LAB — URINALYSIS, MICROSCOPIC ONLY
Glucose, UA: NEGATIVE mg/dL
Glucose, UA: NEGATIVE mg/dL
Hgb urine dipstick: NEGATIVE
Leukocytes, UA: NEGATIVE
Nitrite: NEGATIVE
Specific Gravity, Urine: 1.023 (ref 1.005–1.030)
pH: 5 (ref 5.0–8.0)

## 2010-08-13 LAB — GLUCOSE, CAPILLARY
Glucose-Capillary: 105 mg/dL — ABNORMAL HIGH (ref 70–99)
Glucose-Capillary: 108 mg/dL — ABNORMAL HIGH (ref 70–99)
Glucose-Capillary: 116 mg/dL — ABNORMAL HIGH (ref 70–99)
Glucose-Capillary: 125 mg/dL — ABNORMAL HIGH (ref 70–99)
Glucose-Capillary: 128 mg/dL — ABNORMAL HIGH (ref 70–99)
Glucose-Capillary: 137 mg/dL — ABNORMAL HIGH (ref 70–99)
Glucose-Capillary: 140 mg/dL — ABNORMAL HIGH (ref 70–99)
Glucose-Capillary: 141 mg/dL — ABNORMAL HIGH (ref 70–99)
Glucose-Capillary: 142 mg/dL — ABNORMAL HIGH (ref 70–99)
Glucose-Capillary: 145 mg/dL — ABNORMAL HIGH (ref 70–99)
Glucose-Capillary: 150 mg/dL — ABNORMAL HIGH (ref 70–99)
Glucose-Capillary: 178 mg/dL — ABNORMAL HIGH (ref 70–99)
Glucose-Capillary: 180 mg/dL — ABNORMAL HIGH (ref 70–99)
Glucose-Capillary: 188 mg/dL — ABNORMAL HIGH (ref 70–99)
Glucose-Capillary: 212 mg/dL — ABNORMAL HIGH (ref 70–99)
Glucose-Capillary: 224 mg/dL — ABNORMAL HIGH (ref 70–99)
Glucose-Capillary: 271 mg/dL — ABNORMAL HIGH (ref 70–99)

## 2010-08-13 LAB — CARDIAC PANEL(CRET KIN+CKTOT+MB+TROPI)
CK, MB: 0.4 ng/mL (ref 0.3–4.0)
CK, MB: 0.5 ng/mL (ref 0.3–4.0)
CK, MB: 0.7 ng/mL (ref 0.3–4.0)
Relative Index: INVALID (ref 0.0–2.5)
Relative Index: INVALID (ref 0.0–2.5)
Total CK: 76 U/L (ref 7–177)
Total CK: 87 U/L (ref 7–177)
Troponin I: 0.02 ng/mL (ref 0.00–0.06)
Troponin I: 0.02 ng/mL (ref 0.00–0.06)

## 2010-08-13 LAB — RETICULOCYTES: Retic Ct Pct: 1.8 % (ref 0.4–3.1)

## 2010-08-13 LAB — PHOSPHORUS
Phosphorus: 2.3 mg/dL (ref 2.3–4.6)
Phosphorus: 3.4 mg/dL (ref 2.3–4.6)

## 2010-08-13 LAB — CULTURE, BLOOD (ROUTINE X 2)
Culture: NO GROWTH
Culture: NO GROWTH

## 2010-08-13 LAB — PREGNANCY, URINE: Preg Test, Ur: NEGATIVE

## 2010-08-13 LAB — URINE CULTURE: Special Requests: NEGATIVE

## 2010-08-13 LAB — MAGNESIUM
Magnesium: 1.4 mg/dL — ABNORMAL LOW (ref 1.5–2.5)
Magnesium: 1.5 mg/dL (ref 1.5–2.5)
Magnesium: 2 mg/dL (ref 1.5–2.5)
Magnesium: 2 mg/dL (ref 1.5–2.5)

## 2010-08-13 LAB — FOLATE: Folate: >20 ng/mL

## 2010-08-13 LAB — URINE MICROSCOPIC-ADD ON

## 2010-08-13 LAB — BLOOD GAS, ARTERIAL
Drawn by: 227661
FIO2: 0.21 %
O2 Saturation: 95.1 %
Patient temperature: 98.6

## 2010-08-13 LAB — CK TOTAL AND CKMB (NOT AT ARMC)
Relative Index: INVALID (ref 0.0–2.5)
Total CK: 42 U/L (ref 7–177)

## 2010-08-13 LAB — LACTIC ACID, PLASMA
Lactic Acid, Venous: 0.8 mmol/L (ref 0.5–2.2)
Lactic Acid, Venous: 1.1 mmol/L (ref 0.5–2.2)

## 2010-08-13 LAB — VITAMIN B12: Vitamin B-12: 1343 pg/mL — ABNORMAL HIGH (ref 211–911)

## 2010-08-15 LAB — GLUCOSE, CAPILLARY: Glucose-Capillary: 79 mg/dL (ref 70–99)

## 2010-08-16 LAB — GLUCOSE, CAPILLARY
Glucose-Capillary: 121 mg/dL — ABNORMAL HIGH (ref 70–99)
Glucose-Capillary: 196 mg/dL — ABNORMAL HIGH (ref 70–99)
Glucose-Capillary: 88 mg/dL (ref 70–99)

## 2010-08-16 NOTE — Telephone Encounter (Signed)
I am not sure what is happening with this message; I apologize if you have multiple copies in your inbox.  Please contact Ms. Quam to sched an OV; I will not continue to refill her prednisone.  Thanks!  -Belenda Cruise

## 2010-08-16 NOTE — Telephone Encounter (Signed)
Please see below.

## 2010-08-17 LAB — GLUCOSE, CAPILLARY
Glucose-Capillary: 119 mg/dL — ABNORMAL HIGH (ref 70–99)
Glucose-Capillary: 169 mg/dL — ABNORMAL HIGH (ref 70–99)

## 2010-08-20 ENCOUNTER — Telehealth: Payer: Self-pay | Admitting: *Deleted

## 2010-08-20 NOTE — Telephone Encounter (Signed)
Call from [place] at Dr Quincy Carnes ( Rheumatology) office.  [phone] Pt had appointment today at there office and did not show up.  We referred pt there. Pt is on prednisone 15 mg a day and it's time to decrease dose. Dr Lendon Colonel is asking we don't refill the med until she is seen. Dx:  Giant Cell Arteritis.

## 2010-08-20 NOTE — Telephone Encounter (Signed)
Thank you.  I will not write for any additional refills for prednisone.

## 2010-08-23 LAB — GLUCOSE, CAPILLARY: Glucose-Capillary: 131 mg/dL — ABNORMAL HIGH (ref 70–99)

## 2010-08-24 ENCOUNTER — Encounter: Payer: Self-pay | Admitting: Ophthalmology

## 2010-08-24 NOTE — Progress Notes (Signed)
This encounter was created in error - please disregard.

## 2010-08-31 ENCOUNTER — Other Ambulatory Visit: Payer: Self-pay | Admitting: Internal Medicine

## 2010-09-06 NOTE — Assessment & Plan Note (Signed)
Morgan Wilkerson is back regarding her pain syndrome.  She states that she has been doing a bit better.  She missed her appointment as she was told that she was not called and liked it better when we used to call to remind people with appointments.  Her right ankle is doing a bit better. She has decided against having surgery as is improving somewhat.  She is off crutches but walks in an altered manner still.  Pain is about 6- 7/10, described as sharp stabbing, aching, and tingling.  Pain interferes with general activities, relations with others, enjoyment of life on a moderate-to-severe level.  Sleep is fair.  REVIEW OF SYSTEMS:  Notable for depression, confusion, spasms, tremor, tingling.  A full 12-point review is in the written health history section of the chart.  SOCIAL HISTORY:  The patient is married without specific changes today.  PHYSICAL EXAMINATION:  VITAL SIGNS:  Blood pressure is 118/69, pulse is 111, respiratory rate 18, and she is saturating 95% on room air. GENERAL:  The patient is pleasant, alert, and oriented x3.  Affect is bright and appropriate. MUSCULOSKELETAL:  She walks with some antalgia on the left leg and occasionally externally rotates it to help with clearance and pain.  The ankles have some swelling particularly in the lateral aspect.  No instability was seen today.  Strength is generally 4-5/5 except for some inhibition at the ankle.  Shoulder is mildly impaired with pain also. HEART:  Regular rhythm, but tachycardic. CHEST:  Clear. ABDOMEN:  Soft and nontender.  ASSESSMENT: 1. Fibromyalgia syndrome. 2. Degenerative joint disease, bilateral shoulders with rotator cuff     syndrome and subacromial bursitis on the right particularly. 3. Myofascial shoulder girdle pain. 4. Temporal arteritis. 5. Left anterior talofibular ligament and calcaneal fibular ligament     tears.  PLAN: 1. Recommended an ASO for left ankle for support if she is not going     to have  surgery.  Ultimately, she is going to have surgery, she     needs to decide soon.  She may not be a surgical candidate much     longer even she wants to. 2. I refilled her hydrocodone and methadone today #60 and 120     respectively.  She will come pick up prescriptions next month. 3. Recommended bone density testing given that she will be on chronic     steroids for temporal arteritis.  The patient will follow up her     family doctor in that regard. 4. I will see her back in 4 months and nurse practitioner will see her     back in 2 months' time.     Ranelle Oyster, M.D. Electronically Signed    ZTS/MedQ D:  08/06/2010 12:17:23  T:  08/06/2010 23:30:54  Job #:  [id number]

## 2010-09-07 ENCOUNTER — Encounter: Payer: Self-pay | Admitting: Internal Medicine

## 2010-09-07 ENCOUNTER — Ambulatory Visit (INDEPENDENT_AMBULATORY_CARE_PROVIDER_SITE_OTHER): Payer: Medicare Other | Admitting: Internal Medicine

## 2010-09-07 VITALS — BP 160/93 | HR 125 | Temp 99.1°F | Ht 64.0 in | Wt 219.4 lb

## 2010-09-07 DIAGNOSIS — M797 Fibromyalgia: Secondary | ICD-10-CM | POA: Insufficient documentation

## 2010-09-07 DIAGNOSIS — R5382 Chronic fatigue, unspecified: Secondary | ICD-10-CM

## 2010-09-07 DIAGNOSIS — E119 Type 2 diabetes mellitus without complications: Secondary | ICD-10-CM

## 2010-09-07 LAB — POCT GLYCOSYLATED HEMOGLOBIN (HGB A1C): Hemoglobin A1C: 7.3

## 2010-09-07 NOTE — Progress Notes (Signed)
  Subjective:    Patient ID: Morgan Wilkerson, female    DOB: 1955-10-31, 55 y.o.   MRN: [id number]  HPI Here c/o pain in right posterior shoulder-aches, no weakness, pain in the trapezius muscle distribution Gradual onset- Is ff by pain clinic for fibromyalgia, and on a very extensive regimen for pain.   Review of Systems     Objective:   Physical Exam NAD Normal right shoulder ROM and strength She indicates pain as above in right trapezius distribution       Assessment & Plan:  55 yr old woman with PMHx of fibromyalgia, diagnosed after extensive work up. I reviewed a diagram of the trapezius muscle group with her, advised conservative treatment-heat, rest.  Her HgbA1C = 7.3  Her lab work negative for inflammatory arthritis, and we are discontinuing her prednisone refills to ensure she does not inadvertantly refill it.  Advised FU with her PCP for further review of that

## 2010-09-13 ENCOUNTER — Other Ambulatory Visit: Payer: Self-pay | Admitting: *Deleted

## 2010-09-13 MED ORDER — METFORMIN HCL 1000 MG PO TABS: 1000.0000 mg | ORAL_TABLET | Freq: Two times a day (BID) | ORAL | Status: DC

## 2010-09-21 NOTE — Assessment & Plan Note (Signed)
Morgan Wilkerson is back regarding her fibromyalgia.  I sent her to Dr. Corliss Skains  for a second opinion who apparently felt she did not have fibromyalgia.  I have not seen correspondence from her as of yet.  She did apparently  have some advice for her fibromyalgia, although [name] was not  interested as she felt that she has been doing fairly well on her  current regimen.  She is on prednisone still for her temporal arthritis  per her other treating physicians and the dose has been decreased  apparently.   The patient rates her pain at 5/10 on average.  She describes as sharp  and intermittent aching.  Her Oswestry disability percentage is 40%.  She describes the pain as sharp, intermittent, aching.  Pain interferes  with her general activity, relationship with others, and enjoyment of  life on a moderate level.  Pain worsens with general exercise.  She does  walk a few times a week but generally has more pain after she exercises  and ambulates.   REVIEW OF SYSTEMS:  Notable for occasional numbness, tremor, tingling,  confusion, depression, anxiety, abdominal pain, weight gain, and fever.  She has a bit of limb swelling as well.  No other pertinent positives  are above and full review is in the written health and history section.   SOCIAL HISTORY:  The patient states to me that she has being quilting.  She is going to movies Friday with her husband.  She is exercising 1-3  times a week.   PHYSICAL EXAMINATION:  VITAL SIGNS:  Blood pressure is 131/82, pulse 89,  and respiratory rate 18.  She is sating 98% on room air.  GENERAL:  The patient is pleasant, alert, oriented x3.  Affect is bright  and appropriate.  Gait slightly wide based.  She has full range of  motion in the shoulders and neck, although a bit guarded particularly  with the shoulders.  Mild intention tremor seen.  Strength is near 5/5  with some pain in addition proximally in the upper extremities and in  the hands.  Weight is  unchanged and she remains obese.  HEART:  Regular rate.  CHEST:  Clear.  The patient has multiple tender points on exam.   ASSESSMENT:  1. Fibromyalgia syndrome.  2. Bilateral rotator cuff syndrome/degenerative joint disease of the      shoulders.  3. Left temporal arthritis.  4. Obesity.  5. Depression and insomnia.   PLAN:  1. We will begin Savella trial.  She was given a starter pack and we      will titrate upward while we are tapering down on the Effexor.  The      end result will be 50 mg b.i.d. of Savella and 75 mg of Effexor XR      daily.  Ultimately, the regimen will depend on her tolerance.  2. Continue methadone 10 mg b.i.d. and Norco 10/325 one q.6 h. p.r.n.  3. Maintain Flexeril and Elavil current doses, which are 10 q.8 h.      p.r.n. and 100 and 200 mg at bedtime respectively.  4. The patient stopped the Mestinon, which is fine.  5. Encourage the exercise and activity as possible.  6. I will see her back in 2 months with a Nurse Clinic followup in 1      month.      Ranelle Oyster, M.D.  Electronically Signed     ZTS/MedQ  D:  04/21/2008 12:38:13  T:  04/22/2008 05:43:43  Job #:  [id number]

## 2010-09-21 NOTE — Assessment & Plan Note (Signed)
Morgan Wilkerson is back regarding her fibromyalgia.  She has actually done quite  well with the Mestinon with decreased pain overall.  She is essentially  taking 60 in the morning and 30 at lunchtime now.  She had some dry  mouth and some tongue swelling initially with it but is doing better  currently.  She has had some right shoulder pain which has been  persistent.  She rates her pain a 7/10.  Describes her pain as sharp,  burning, and tingling at times.  Sleep is fair.  Pain interferes with  her general activity, relationships with others, and enjoyment of life  on a moderate to severe level.   REVIEW OF SYSTEMS:  Notable for the above as well as some trouble  walking, anxiety, night sweats, occasional abdominal pain, limb  swelling, shortness of breath.  Full review is in the written health and  history section.   SOCIAL HISTORY:  Without change.   PHYSICAL EXAMINATION:  VITAL SIGNS:  Blood pressure is 116/63.  Pulse is  88, respiratory rate 18.  She is hanging on 80% on room air.  Weight is  generally about the same.  GENERAL:  The patient is pleasant, alert, and oriented x3.  Affect is a  bit flat but a bit brighter than last visit.  EXTREMITIES:  She had pain in both shoulders with rotation and  impingement maneuvers today.  Strength is 4/5 generally throughout with  some intermittent pain inhibition.  HEART:  Regular.  CHEST:  Clear.  ABDOMEN:  Soft, nontender.   ASSESSMENT:  1. Fibromyalgia syndrome.  2. 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  3. Chronic headaches.  4. Inguinal neuropathic pain.  5. History of obesity.  6. Depression.  7. Insomnia.  8. Osteoarthritis of the knees.  9. De Quervain's tenosynovitis.   PLAN:  1. Continue Mestinon, trying to titrate up to 60 mg b.i.d.  2. Will try to taper methadone off over the next month.  I refilled 10      mg t.i.d.  3. Norco 10/325 1 q.6 hours p.r.n. for breakthrough pain.  4. Continue Effexor, Flexeril, and Elavil.  5. The patient ultimately still needs surgical evaluation of her      shoulders.      Ranelle Oyster, M.D.  Electronically Signed     ZTS/MedQ  D:  05/11/2007 09:57:27  T:  05/11/2007 10:18:06  Job #:  [id number]

## 2010-09-21 NOTE — Op Note (Signed)
NAMEELLANORE, VANHOOK                ACCOUNT NO.:  [id number]   MEDICAL RECORD NO.:  [id number]          PATIENT TYPE:  AMB   LOCATION:  SDS                          FACILITY:  [facility]   PHYSICIAN:  Cherylynn Ridges, M.D.    DATE OF BIRTH:  Sep 18, 1955   DATE OF PROCEDURE:  10/18/2007  DATE OF DISCHARGE:  10/18/2007                               OPERATIVE REPORT   PREOPERATIVE DIAGNOSIS:  Headaches and visual changes with possibility  of temporal arteritis.   POSTOPERATIVE DIAGNOSIS:  Headaches and visual changes with possibility  of temporal arteritis.   PROCEDURE:  Left temporal artery biopsy.   SURGEON:  Marta Lamas. Lindie Spruce, MD   ANESTHESIA:  General with laryngeal airway.   ESTIMATED BLOOD LOSS:  Less than 20 mL.   COMPLICATIONS:  None.   CONDITION:  Stable.   FINDINGS:  No evidence, obviously a periarterial inflammation.   INDICATIONS FOR OPERATION:  The patient is a 55 year old with recent  visual changes and headaches, who comes in now for a temporal artery  biopsy.   OPERATION:  The patient was taken to the operating room, placed on the  table in supine position.  After an adequate general laryngeal airway  anesthetic was administered, she was prepped and draped in usual sterile  manner exposing the left temporal area.   We localized the area of the artery anterior to the left ear tragus with  the Doppler.  We made an incision underneath the area of the Doppler  approximately 4 cm long and dissected out the temporal artery in that  area.  We ligated it proximally and distally with 3-0 Vicryl ties.  We  also ligated branches with 3-0 Vicryl ties.  Once we had adequate  proximal and distal control with occlusion of the feeding branches, we  transected a piece or excised a piece approximately 3 cm long, placed it  on Telfa as the specimen, and it was sent to the laboratory for  pathology.  All  counts were correct.  We irrigated with antibiotic solution, obtained  hemostasis,  and the subcu with electrocautery, and closed the skin using  running subcuticular stitch of 4-0 Monocryl.  Dermabond and Steri-Strips  were applied.  All counts were correct.      Cherylynn Ridges, M.D.  Electronically Signed     JOW/MEDQ  D:  10/18/2007  T:  10/19/2007  Job:  [id number]

## 2010-09-21 NOTE — Assessment & Plan Note (Signed)
HISTORY OF PRESENT ILLNESS:  Morgan Wilkerson is back regarding her fibromyalgia  and chronic pain. She has had more problems of late with her right  shoulder. Otherwise, there has been no specific change. She rates her  pain on an average of 7 out of 10. She describes her pain as sharp,  burning, constant, tingling, and aching. She has pain in both shoulders,  legs, back, extremities, etc. Sleep has been fair. Effexor helps  somewhat with her mood. We are able to get her some assistance with this  medication, to get it filled. She remains on methadone for baseline pain  control and Norco for breakthrough symptoms.   REVIEW OF SYSTEMS:  Notable for multiple items being positive. Please  see the written health and history section on the chart.   SOCIAL HISTORY:  Without significant change.   PHYSICAL EXAMINATION:  VITAL SIGNS:  Blood pressure 128/70, pulse 89,  respiratory rate 16. She is sating 95% on room air.  GENERAL:  The patient is flat but generally pleasant. No acute distress.  EXTREMITIES:  She has pain in both shoulders with rotator cuff maneuvers  today. Strength is generally 4/5 out of 5 with some pain inhibition  proximally in the upper extremities.  HEART:  Regular.  CHEST: Clear.  ABDOMEN:  She remains overweight. Soft and nontender. She has multiple  tender points throughout again toady.   ASSESSMENT:  1. Fibromyalgia syndrome.  2. Bilateral rotator cuff syndrome and some bicipital tendinitis as      well.  3. Chronic headaches.  4. Inguinal neuropathic pain.  5. History of obesity.  6. Depression.  7. Insomnia.  8. Osteoarthritis of the knees.  9. De Quervain tenosynovitis.   PLAN:  1. Try Mestinon 30 mg b.i.d. for endurance and strength.  2. Refill methadone 10 t.i.d. and Norco 10/325 1 q.6 hours p.r.n. for      pain control.  3. Continue Effexor, Flexeril, and Elavil.  4. Encourage regular activity.  5. Continue conservative management of the shoulders. She  ultimately      needs a surgical evaluation from them again.      Ranelle Oyster, M.D.  Electronically Signed     ZTS/MedQ  D:  03/13/2007 10:18:59  T:  03/13/2007 19:05:32  Job #:  [id number]

## 2010-09-21 NOTE — Assessment & Plan Note (Signed)
Morgan Wilkerson is back regarding her fibromyalgia syndrome.  She remains on  prednisone for her temporal arteritis syndrome.  She seems to do a bit  better with this but has gained some weight, has some swelling related  to this.  She had a fall a few weeks back on her right leg, gave out  after she had a sharp pain in her inguinal region.  She has had  occasional of persistent pain since then, but nothing that severe.  The  patient tripped over a box at home and fell also.  Sleep is fair.  Mood  overall has improved.  Her pain is 5/10 on average and described as  sharp, stabbing, and aching at times.  Pain interferes with general  activity, relations with others, and enjoyment of life on a moderate-to-  severe level.  The patient remains on Effexor 75 mg XR 3 tablets daily,  methadone 10 b.i.d., Norco 10/325 one q.6 h p.r.n., Flexeril 10 mg q.8 h  p.r.n., Elavil 100 mg 2 at bedtime.  She took Mobic for a short time for  thumb pain and used her splint successfully.  She still has occasional  numbness over the thumb.   REVIEW OF SYSTEMS:  Notable for multiple items as noted above.  Headache  seemed to be most debilitating at this point.  She also has low back  pain at times after she has been standing for longer periods.   SOCIAL HISTORY:  Unchanged.   PHYSICAL EXAMINATION:  VITAL SIGNS:  Blood pressure is 154/74, pulse is  96, respiratory rate 18.  She is sating 100% on room air.  GENERAL:  The patient is pleasant, alert and oriented x3.  Affect is  alert and bright.  She is fairly happy today.  Weight is increased.  JOINTS:  Pain was improved with the right CMC joint.  Tinel sign was  negative once again.  Shoulders were positive for impingement signs.  HEART:  Regular.  CHEST:  Clear.  ABDOMEN:  Soft, nontender.  She had some swelling at both ankles, left  greater than right.   ASSESSMENT:  1. Fibromyalgia syndrome.  2. Bilateral degenerative joint disease of the shoulders.  3. Left  temporal arteritis.  4. Obesity.  5. Depression with insomnia.  6. Right carpometacarpal arthritis.   PLAN:  1. Continue medications as noted above.  2. The patient told me she is scheduled to see Neurology.  I am not      sure what the new indication is.  She has had chronic multiple pain      issues.  I believe, she has seen Lesia Sago in the past for pain      and nerve-related issues.  3. Encourage increased activity, health hygiene, as well as      socialization.  4. We will see her back in 3 months' time with 70-month nursing      followup.      Morgan Wilkerson, M.D.  Electronically Signed     ZTS/MedQ  D:  09/10/2008 13:48:40  T:  09/11/2008 02:23:05  Job #:  [id number]

## 2010-09-21 NOTE — Consult Note (Signed)
NAME:  Morgan Wilkerson, Morgan Wilkerson NO.:  [id number]   MEDICAL RECORD NO.:  [id number]          PATIENT TYPE:  INP   LOCATION:  3707                         FACILITY:  [facility]   PHYSICIAN:  Melvyn Novas, M.D.  DATE OF BIRTH:  02-May-1956   DATE OF CONSULTATION:  09/21/2007  DATE OF DISCHARGE:  09/21/2007                                 CONSULTATION   The patient is a 55 year old outpatient care center resident patient, an  African American married female who presents with a complaint of mainly  left-sided headache and right-sided eye swelling.  The headaches have  originally been sharp, stabish in character, but she was not found to  have other focal deficits or pain-related lesions on physical exam  except photophobia and the right eye ptosis.  A neurology consultation  was, however, called to evaluate the patient for a chronically elevated  CK rate and to rule out myositis.   There is no dictated H&P available looking back to the year 2006.   PAST MEDICAL HISTORY:  As I gather from multiple other notes, the  patient has fibromyalgia and is followed by Dr. Rosalyn Charters rehab.  She has  seen Dr. Anne Hahn , [facility] in the past, and he did  conduct her studies and  an EMG as she reports.  In a lter visit he then  explained to her that  this was not a neurologic problem and she was referred to the physical  therapy and rehab center.   She is for years in chronic pain and mainly lower back pain, has now  more frequent falls or the feeling that she would fall.  She is  hypertensive and anemic.  She is on chronic methadone.   Review of systems is negative for jaw clenching, memory loss, vision  loss, patchy lesions, dermatomal lesions of other kind such as rash or  elevated urticaria.  She does endorse positively tachycardia, weight  gain, and nonrestorative sleep.   SOCIAL HISTORY:  The patient worked until 2003, is now disabled,  married, lives with her husband.   Family history is  positive for hypertension.   MEDICINES AT THE TIME OF HER ADMISSION:  Aspirin; Lovenox; methadone 10  mg nightly; metoprolol 100 mg b.i.d.; Protonix once a day; Elavil 100 mg  nightly; Flexeril at nighttime, no dose; Effexor 225 mg daily; and  Mestinon 60 mg daily p.r.n.  The patient can take hydrocodone.   She lists, however, CODEINE as an medication that cause allergies.   PHYSICAL EXAMINATION:  VITAL SIGNS:  The patient has a temperature of 98  degrees Fahrenheit, a heart rate of 88, a respiratory rate of 19, blood  pressure was 112/69, earlier this morning and is now on the 130 range.  LUNGS:  Clear to auscultation.  There is no goiter and no trouble with  swallowing.  No dysphagia.  No dysarthria.  No apraxia.  She is not  presenting any no murmur, bruits.  EXTREMITIES:  No clubbing, cyanosis, rash, or bruising.  No joint  swelling.  The patient seems to be focally weak in her lower extremity  swelling on her right and has an asymmetric face due to the right-sided  ptosis.  PUPILS:  Reactive and equal to light.  She describes left-sided temple numbness, a feeling she states that  stays with her when severe stabbing headaches at the same locus resolve.   MOTOR:  Shows equally weak grip strength.  She has, however, bilaterally  equal tone and mass in the upper extremities.  I obtained deep tendon  reflexes and they are very attenuated in the lower extremities about 1  in her upper extremities.  She has a week shoulder abduction  bilaterally.  She has very weak hip abduction, adduction, and flexion.  Dorsiflexion and plantar flexion were present.  No pain at the joint  level.  No lesions or rash that would suspect that the patient has  dermatomyositis.   DISCUSSION:  I was called to rule out a myositis due to myalgia.  CK  elevations to 206 and 209 are truly not elevations.  Her sed rate at 78  is more concerning given that she is also in her 29s.  I agree with the  troponin  checks which have been negative so far, but would like for this  patient to have a TSH and T4.    A serum protein electrophoresis to look for autoimmune process.  I reviewed her metabolic panel and I have reviewed her radiological  results.  There is no relevant findings that would explain her clinical  symptoms.  I think that the fibromyalgia diagnosis is most likely the cause for her  pain in the muscles.  I cannot find any note that states that the  patient ever found benefit in taking a prednisone Dosepak, but strongly  recommend a trial if it has not taken place yet. I asked for C reactive  protein to look for a vasculitic process.      Melvyn Novas, M.D.  Electronically Signed     CD/MEDQ  D:  09/21/2007  T:  09/22/2007  Job:  [id number]   cc:   Alvester Morin, M.D.  Ranelle Oyster, M.D.

## 2010-09-21 NOTE — Assessment & Plan Note (Signed)
Morgan Wilkerson is back regarding her fibromyalgia and chronic pain.  Apparently  she was admitted, briefly, to [facility] on 09/27/2007 for a  left-sided facial numbness.  It sounds as if there is a presumptive  diagnosis of temporal arteritis.  She is going for temporal artery  biopsy by Dr. Lindie Spruce.  I really do not have details to confirm this.  The  patient is on prednisone 60 mg daily and symptoms have improved.  She  still has some temporal tenderness on the left side, although it is  substantially better.  We had last seen her in January, and we were  trying to move off the methadone, and she had been doing very well with  Mestinon.  She realized that after coming off the methadone that her  shoulder pain increased so she resumed.  She has missed her refill,  however, last month due to the hospitalization, essentially.  She rates  her pain as a 5-7/10.  The pain is stabbing and sharp.  The pain  interferes with her general activity. relations with others, and  enjoyment of life on a severe level.  Sleep is fair.   REVIEW OF SYSTEMS:  Notable for the above.  No numbness, tremor, trouble  walking, dizziness, depression, anxiety, night sweats, waking, and limb  swelling.   SOCIAL HISTORY:  Not changed today.   PHYSICAL EXAMINATION:  Blood pressure 152/91, pulse 107, respiratory  rate 18.  She is saturating 99% on room air.  The patient is pleasant, alert and oriented x3.  Affect is generally  flat but pleasant.  She has some tenderness still in the left temporal  region.  Both shoulders were notably painful with palpation and movement  with rotator cuff maneuvers.  Strength is 4-5/5.  HEART:  Regular.  CHEST:  Clear.  ABDOMEN:  Soft, nontender.  She is still overweight.  Exam remains  positive for multiple tender points throughout.   ASSESSMENT:  1. Fibromyalgia.  2. Bilateral rotator cuff syndrome/degenerative joint disease of the      shoulders.  3. Chronic headaches.  4.  Left temporal arteritis(?)  5. History of obesity.  6. History of depression and insomnia.   PLAN:  1. Continue Mestinon, Effexor, Flexeril, and Elavil as previously      dose.  2. Resume methadone at 10 mg b.i.d. as this is providing her benefit      for her shoulder and arthritic pain.  Also refilled Narco 10/325      one q.6 h. p.r.n. #120.  3. Followup with Dr. Lindie Spruce for biopsy.  I will be happy to coordinate      her care, afterwards depending upon results.  She needs to contact      me if needed.  I have asked her to have Dr. Cliffton Asters send me a copy of      the report and pathology results.  We will see the patient back in about 3 months time otherwise.  She will  see the nurse for a visit next month.      Ranelle Oyster, M.D.  Electronically Signed    ZTS/MedQ  D:  10/05/2007 16:12:18  T:  10/05/2007 17:21:32  Job #:  [id number]

## 2010-09-21 NOTE — Assessment & Plan Note (Signed)
Morgan Wilkerson is back regarding her fibromyalgia.  She has not done well with  the [facility] and reports irritation and agitation as well as anxiety,  allergy-like symptoms etc.  She came off the Effexor entirely, although  that is not what I recommended.  She is on methadone and Norco as usual.  Rates her pain a 6-7/10.  She reports swollen and painful right thumb  with numbness.  She describes pain otherwise as sharp, stabbing,  intermittent.  Pain interferes with general activity, relationships with  others, and enjoyment of life on a severe level.  Sleep is fair.  Pain  worsens with walking, bending, sitting, standing, and some activities.   REVIEW OF SYSTEMS:  Notable for the above as well as some depression,  anxiety, weakness.  Full review is in the written health and history  section of chart.   SOCIAL HISTORY:  Unchanged.   PHYSICAL EXAMINATION:  VITAL SIGNS:  Blood pressure 143/81, pulse is 86,  respiratory 18.  She is sating 99% on room air.  GENERAL:  The patient is pleasant, alert and oriented x3.  Affect is  generally flat as usual.  Weight seems to be increased.  She had pain at  the right [facility] joint with no noticeable wasting.  Tinel signs were  negative at the right forearm and the right wrist.  Shoulders were  painful left per usual with no obvious change.  HEART:  Regular rate.  CHEST:  Clear.  ABDOMEN:  Soft, nontender.   ASSESSMENT:  1. Fibromyalgia syndrome.  2. Bilateral rotator cuff syndrome/degenerative joint disease of the      shoulders.  3. Left temporal arteritis.  4. Obesity.  5. Depression with insomnia.  6. Right carpometacarpal arthritis.   PLAN:  1. Stop Savella and restart Effexor titrating up to 75 mg 3 tablets XR      daily.  2. Refill methadone 10 mg b.i.d., #60.  Continue Norco 10/325 for      breakthrough pain.  3. Maintain Flexeril and Elavil at current doses for spasm, sleep and      generalized pain.  4. We ordered Allegra-D for allergy  symptoms for this month.  5. Start Mobic 7.5 mg daily for 2 weeks for a right hand/thumb pain.      I sent her to [facility] for a thumb splint.  6. We will see her back in 1 month with nursing at 3 months with me.      Ranelle Oyster, M.D.  Electronically Signed     ZTS/MedQ  D:  06/18/2008 13:58:01  T:  06/19/2008 02:20:51  Job #:  [id number]

## 2010-09-21 NOTE — Discharge Summary (Signed)
[name], [name]                ACCOUNT NO.:  [id number]   MEDICAL RECORD NO.:  [id number]          PATIENT TYPE:  INP   LOCATION:  3707                         FACILITY:  [facility]   PHYSICIAN:  Fransisco Hertz, M.D.  DATE OF BIRTH:  12-Sep-1955   DATE OF ADMISSION:  09/20/2007  DATE OF DISCHARGE:  09/21/2007                               DISCHARGE SUMMARY   DISCHARGE DIAGNOSES:  1. Focal facial weakness.  2. Fibromyalgia.  3. Hypertension.  4. Chronic headaches.  5. Insomnia.  6. Depression.   DISCHARGE MEDICATIONS:  1. Methadone 10 mg at bedtime.  2. Mestinon 60 mg 1 tablet daily.  3. Effexor 75 mg by mouth 3 tablets every morning.  4. Amitriptyline 100 mg 2 tablets by mouth at bedtime.  5. Flexeril 10 mg t.i.d.  6. Lopressor 100 mg b.i.d.  7. Vicodin 10/325 mg by mouth q.6 h.   DISPOSITION:  The patient will follow up with Dr. Riley Kill on Oct 05, 2007, at 2:40 p.m. and also with Dr. Ardyth Harps at the [facility] on Sep 27, 2007, at 9:15 a.m.   PROCEDURES PERFORMED:  1. MRI of the brain that showed a few punctuated white matter foci,      not likely of clinical relevance. The brain has normal appearance      with an exception of the previous mentioned.  No acute infarction,      no masses, no hemorrhage, no hydrocephalus, no extraaxial      collection, no pituitary lesions.  2. CT scan of the head that showed no atrophy, no infarction, no mass      lesion, no hemorrhoids, no hydrocephalus.   CONSULTANTS:  Neurology:  Melvyn Novas, M.D.   BRIEF ADMITTING HISTORY AND PHYSICAL:  [name] is a 55 year old with  past medical history of left frontal parietal headaches, left facial  numbness, and right facial focal tingling who comes in for pain on her  left side since yesterday radiating down her face.  At first, it was  pressure-like.  She also relates some numbness along the face.  Nothing  makes it better.  Noise makes it worst and light.  She relates  no other  focal weaknesses.  There is no erythema, no redness, no feeling of  hotness, no change in her headaches.   PHYSICAL EXAMINATION:  VITAL SIGNS:  Temperature 98.1, pulse 102,  respirations 17, and blood pressure 146/85.  She is saturating 97% on  room air.  GENERAL:  She appears in no acute distress.  HEENT:  Eyes, extraocular movements are intact.  NECK:  No JVD.  HEART:  Cardiovascular-wise, regular rate and rhythm.  Normal S1 and S2.  LUNGS:  Good air movement and clear to auscultation.  ABDOMEN:  Positive bowel sounds, nontender, nondistended, and soft.  EXTREMITIES:  Positive pulses, no edema.  NEUROLOGIC:  She is alert and oriented x3.  Coherent and fluent  language.  She relates some left facial asymmetric numbness on her face.  No tongue deviation.  Muscle strength is 5/5 throughout.  There is also  some  weakness on her eye movements laterally and also some asymmetry of  her face when she smiles.  Her reflexes are 2+ bilaterally.  Negative  Babinski.  Finger-to-nose is normal.  Otherwise, her neurologic exam on  her body is completely nonfocal and on her upper extremity and lower  extremity are normal.   LABORATORY DATA:  Her CBC on admission was white count 9.0, hemoglobin  11.5, ANC 4.5, and MCV 86.  Sodium 138, potassium 3.7, chloride 100,  bicarbonate 28, glucose 151, BUN 9, creatinine 0.6, bilirubin 0.7,  alkaline phosphatase 83, AST 41, ALT 47, total protein 7.3, albumin 3.6,  and ESR was 78.  Urine pregnancy test was negative.  UA showed 3 to 6  white blood cells and rare bacteria.  The UDS was positive for  benzodiazepines and opiates.   BRIEF ADMITTING HISTORY AND PHYSICAL:  1. Focal facial weakness.  The patient was admitted from the clinic. A      CT scan was done to rule out any hemorrhages.  We were concerned      that the physical exams are not consistent from person to person,      and at times, her smile was symmetric.  Neurology was consulted,       and we got an MRI that showed results as above.  Neurology did not      think she was not having a stroke and also not a candidate for      thrombolysis.  This was thought to be most likely a complicated      migraine versus some kind of conversion disorder versus chronic      polymyositis, so they recommended a followup appointment with the      rheumatologist done as an outpatient.  She was kept on telemetry      and no arrhythmias were found.  2. Fibromyalgia.  She relates no exacerbation of this and no change.      She was kept on her current medications.  3. Hypertension.  No changes were made to her medication.  4. Chronic headaches.  She was continued on her current home dose.  We      were worried about worsening of her headache, and she will be      followed up by the neurologist.  5. Insomnia.  No changes were made to her medication.  6. Depression.  No changes were made to her medication.   On the day of discharge, her vitals were temperature 97.8, pulse 88,  respirations 18, and blood pressure 112/69.  Her serum protein  electrophoresis detected no M-spike.  ANA was negative.  Acute hepatitis  panel was negative.  Aldolase was slightly high at 9.5.  Urine cultures  were negative.  Anemia panel was within normal limits.  Rheumatoid  factor was nonreactive.  On the day of discharge, her sodium was 138,  potassium 3.9, chloride 100, bicarbonate 29, glucose 116, BUN 8,  creatinine 0.7, white count 9.5, hemoglobin 11.4, and platelets 196,000.     [name], M.D.  Electronically Signed      Fransisco Hertz, M.D.  Electronically Signed   AF/MEDQ  D:  09/27/2007  T:  09/28/2007  Job:  [id number]   cc:   Melvyn Novas, M.D.

## 2010-09-21 NOTE — Assessment & Plan Note (Signed)
Morgan Wilkerson is back regarding her fibromyalgia syndrome.  She is also  complaining of ongoing left ankle pain.  She saw Dr. Worthy Rancher who x-  rayed her ankle again and found no damage.  She tells me that no  specific recommendations were made.  She has been using a crutch to  unload the leg.  Notes that the ankle swells as well as the right one to  a certain extent with activity.  She has lot of pain in the anterior and  lateral ankle.  She rates her pain as 6-7/10, described as sharp and  aching.  Pain interferes with general activity, relations with others,  enjoyment of life on a severe level.  Sleep is fair.  She is taking her  methadone and Vicodin for baseline pain control.   The patient has had no further followup regarding her temporal  arteritis.  She sees Dr. Anne Hahn next week as opposed due to her gait and  balance deficits.   REVIEW OF SYSTEMS:  Notable for weakness, numbness, tremor, tingling,  trouble walking, spasms, dizziness, confusion, depression, anxiety, high  sugars.  Other pertinent positives are above and full review is in the  written health and history section of the chart.   SOCIAL HISTORY:  Unchanged.   PHYSICAL EXAMINATION:  VITAL SIGNS:  Blood pressure is 114/69, pulse is  115, respiratory rate 18, she is sating 98% on room air.  GENERAL:  The patient is pleasant, alert, and oriented x3.  Affect is  bright and appropriate.  She is alert.  She is using her cane to unload  the leg.  HEART:  Regular.  CHEST:  Clear.  ABDOMEN:  Soft, nontender.  Both ankle shows some swelling; however, left is more involved more than  the right.  With range of motion, she had significant pain with plantar  flexion as well as controlled inversion.  She had tenderness over  palpation of the left lateral malleolus over the anterior compartment of  the ankle.  Cognitively, she was intact and at baseline.   ASSESSMENT:  1. Fibromyalgia syndrome.  2. Degenerative joint disease of  the shoulders.  3. Left temporal arteritis.  4. Obesity.  5. Depression with insomnia.  6. Left ankle sprain after a fall with ongoing pain.  X-rays have been      negative for fracture.  7. Right metacarpal arthritis.   PLAN:  1. We will send the patient for an MRI of the left ankle to assess for      occult fracture or ligament tear.  2. The patient has refills of her methadone and hydrocodone which are      written as 10 mg b.i.d. and 10/325 one q.6 h. p.r.n. #60 and #120.  3. I will see her back in the office pending the MRI.  I did advise      her to place the left foot back in a walking boot until I discuss      it further with her.       Ranelle Oyster, M.D.  Electronically Signed     ZTS/MedQ  D:  11/26/2008 12:42:21  T:  11/27/2008 02:31:19  Job #:  [id number]

## 2010-09-21 NOTE — Discharge Summary (Signed)
NAMEDEBI, COUSIN                ACCOUNT NO.:  [id number]   MEDICAL RECORD NO.:  [id number]          PATIENT TYPE:  INP   LOCATION:  5123                         FACILITY:  [facility]   PHYSICIAN:  Eliseo Gum, M.D.   DATE OF BIRTH:  1955-07-07   DATE OF ADMISSION:  10/23/2007  DATE OF DISCHARGE:  10/26/2007                               DISCHARGE SUMMARY   DISCHARGE DIAGNOSES:  1. Hyperglycemia likely secondary to prednisone and diabetes mellitus.  2. Urinary tract infection.  3. Temporal arteritis.  4. Dyspnea.  5. Anemia.  6. Leukocytosis.   DISCHARGE MEDICATIONS:  1. Amitriptyline 100 mg p.o. daily.  2. Lantus 18 units subcutaneously at bedtime.  3. Methadone 10 mg p.o. t.i.d.  4. Lopressor 100 mg p.o. b.i.d.  5. Pyridostigmine 30 mg p.o. t.i.d.  6. Cyclobenzaprine 10 mg p.o. q.8 h. p.r.n.  7. Prednisone 20 mg 2-1/2 tablets p.o. daily.  8. Effexor 75 mg p.o. t.i.d.  9. Hydrocodone 10/325 mg one p.o. q.6 h. P.r.n.  10.Mycostatin 5 mL p.o. t.i.d., the patient is not to swallow.  11.Bactrim 1 p.o. b.i.d. x3 days.  12.Glipizide 5 mg p.o. b.i.d.   DISPOSITION AND FOLLOWUP:  The patient is to follow up with Dr. Onalee Hua  in the outpatient clinic on November 05, 2007 at 8:45 a.m.  At this time,  the patient's blood sugar control should be reassessed.  Of note, the  patient will also need to see Jamison Neighbor in the outpatient clinic for  further diabetes management.  The patient's potassium should also be  checked as to make sure that it is normal.   PROCEDURES DURING HOSPITALIZATION:  Chest x-ray on October 23, 2007.  Impression:  No acute cardiopulmonary disease.  The patient also had a 2-  D echo done on October 24, 2007 for her shortness of breath.  Impression:  Overall left ventricular systolic function was normal.  LVEF equals 65%.  There was no diagnostic evidence of left ventricular regional wall  motion abnormalities.  Left ventricular wall thickness was at the upper  limits of  normal.  Normal aortic valve.  Normal mitral valve.  Left  atrial size was at upper limits of normal.   CONSULTATIONS:  There were no consultations made during the hospital  stay.   BRIEF HISTORY AND PHYSICAL:  As follows.   CHIEF COMPLAINT:  Hyperglycemia.   HISTORY OF PRESENT ILLNESS:  The patient is a 55 year old woman with  past medical history of migraines, clinical temporal arteritis with a  negative biopsy, and hyperglycemia secondary to prednisone.  The patient  was noted to have elevated blood sugars on June 11 during her biopsy  procedure for the presumed temporal arteritis.  At this time, she was  started on metformin 500 mg p.o. b.i.d.  On the day of admission, she  presented to the outpatient clinic for followup of her hyperglycemia.  The patient has been complaining of polyuria, polydipsia.  She also  relates dyspnea on exertion over the last weekend that is better at the  moment of the interview.  She describes dizziness and heaviness  on  exertion.  She relates also abdominal pain 5/10 cramping in quality,  started after she took her metformin.  She relates headache, numbness,  and jaw pain resolved but still has some blurry vision.   ALLERGIES:  PERCOCET, DARVOCET.   PAST MEDICAL HISTORY:  Per hospital chart.   PHYSICAL EXAMINATION:  VITAL SIGNS:  Temperature equals 98.2, blood  pressure equals 109/74, pulse equals 76, respiratory rate equals 18, O2  sats equals 95% on room air, and blood sugar was 435.  GENERAL:  The patient is in no acute distress.  EYES:  PERLA, anicteric.  ENT:  Mild tonsillar erythema, tongue with whitish plaque.  NECK:  Supple.  No thyromegaly.  RESPIRATORY:  Clear to auscultation bilaterally.  CV: S1, S2, regular rate and rhythm.  GI: Soft, positive bowel sounds, nondistended.  Mild generalized pain.  EXTREMITIES:  No edema, no cyanosis.  GU:  No CVA tenderness.  SKIN:  No rashes.  Lymphadenopathy none noted.  MUSCULOSKELETAL:  The  patient is moving all 4 extremities.  NEURO:  Alert and oriented x3.  Cranial nerves grossly intact.  Sensation intact.  Motor intact.  DTRs within normal limits.  PSYCH:  Appropriate.   LABORATORY DATA:  Sodium of 131, potassium 5.3, chloride 92, bicarb 30,  BUN 24, creatinine 1.05, glucose 192, anion gap of 9, bilirubin 0.9, alk  phos 65, AST 26, ALT 55, protein 7.0, albumin 3.6, calcium 10.0,  calculated osmolality is 298.  Hemoglobin 13.9 with a hematocrit of  41.8.  UA showed urine glucose greater than 1000 ketones at 15, WBCs 0-  2, rare bacteria.  Of note, significant labs during hospital stay  included cardiac enzymes which were negative.  ESR equals 25, lipase  equals 32, C-reactive protein equals 0.4, hemoglobin A1c equals 11.7,  magnesium 2.0.  The patient also had a UA repeat which showed glucose  greater than 1000, microscopic WBC 0-2, RBC 0-2, bacteria many.   HOSPITAL COURSE:  1. Hyperglycemia.  In the clinic, the patient was given 10 units of      NovoLog for her elevated CBGs.  She was also put on sliding scale      insulin, initially was sensitive coverage and was also started on      Lantus 5 units subcutaneously at bedtime.  The patient's blood      sugars remained elevated at 300 range up until the day of      discharge.  Her most recent blood sugars have been in the 200 range      which are considered suitable for discharge home.  After her Lantus      was further increased to 15 units, she was given 5 units of      NovoLog.  As a result, she will be discharged on Lantus 18 units      subcutaneously at bedtime.  Of note, 1 day prior to discharge she      was started on glipizide 5 mg p.o. b.i.d..  This will be continued      in the outpatient setting as well.  The patient has received      thorough diabetes education here while in the hospital.  She will      need further management in the outpatient clinic for both her      diabetes as well as diabetes education.   The patient has been      instructed on the signs of hyper and hypoglycemia..  2. Urinary tract  infection.  Although, the patient's most recent      urinalysis showed no bacteria, 0-2 WBCs, and was negative for      leukocyte esterase, she did mention some dysuria on the day of      discharge.  As a result, we will treat with a 3-day course of      Bactrim.  This should be reassessed in the outpatient setting.  3. Temporal arteritis.  Not sure if the patient actually does have      temporal arteritis based on the negative biopsy, however, the      information can be missed.  We will continue the patient on a      prednisone taper.  Further management in the outpatient setting.  4. Dyspnea.  The patient's shortness of breath actually had resolved      prior to coming into the hospital.  She did receive a 2-D echo      while hospital as outlined in procedures.  The patient is currently      breathing without distress.  5. Anemia.  The patient will need an outpatient colonoscopy set up.      Her hemoglobin remained stable during the hospital stay.  6. Leukocytosis.  The patient's WBCs are trending down on the day of      discharge.  This is likely secondary to her steroids and her UTI.      The patient is currently afebrile and her abdominal pain is much      better on time of discharge.  Both her WBCs and her abdominal pain      should be reassessed in the outpatient setting.   DISCHARGE LABORATORY DATA:  WBC 14.7, hemoglobin 11.3, hematocrit 33.2,  MCV 87.9, platelets 213, ANC is 6.0 and normal, absolute lymphocytes 8.1  and elevated.  Sodium 138, potassium 3.7, chloride 99, bicarb 31, BUN  13, creatinine 0.72, glucose 249, calcium 8.7.   DISCHARGE VITALS SIGNS:  Temperature 98.6, pulse 69, respiratory rate  18, blood pressure 122/78, O2 sats 96% on room air.      Rufina Falco, M.D.  Electronically Signed      Eliseo Gum, M.D.  Electronically Signed    JY/MEDQ  D:   10/26/2007  T:  10/27/2007  Job:  [id number]   cc:   Mariea Stable, MD

## 2010-09-21 NOTE — Assessment & Plan Note (Signed)
Morgan Wilkerson is back regarding her multiple issues related to fibromyalgia.  She was worked up for temporal arteritis in May.  Apparently, biopsy was  negative, although they continued the high-dose steroid treatments,  which has driven her into diabetes.  She is on insulin now for coverage  and that has been a challenge unto itself.  She still has occasional  headaches.  Her pain in the shoulders particularly has been a bit  improved.  She still complains of generalized fatigue and pain at a 5/10  level.  The pain is sharp, stabbing, and sometimes burning.  Pain  interferes with general activity, in relationship with others, and  enjoyment of life on a moderate-to-severe level.  She is using methadone  still at 10 mg b.i.d. and Norco for breakthrough pain 1 q.6 h. p.r.n.  She is followed by the [facility] apparently for her  medical issues.  She also uses Effexor daily as well as Flexeril and  Elavil for sleep and spasm.   REVIEW OF SYSTEMS:  Notable for multiple issues including spasm,  dizziness, anxiety, depression, numbness, bladder control issues, fever,  chills, high sugars, constipation, dysuria, and limb swelling.  Full  review is in the written health and history section.  Other pertinent  positives are above.   SOCIAL HISTORY:  The patient recently was approved for Medicare.   PHYSICAL EXAMINATION:  VITAL SIGNS:  Blood pressure is 149/84, pulse is  83, respiratory rate 18, and she is sating 99% on room air.  GENERAL:  The patient is pleasant, alert, and oriented x3.  Affect is  flat, but generally alert.  She continues to have some tenderness  throughout multiple areas including shoulders, back, hypogastric region,  buttocks, etc.  She has gained noticeable weight since I last saw her.  Face was a bit puffy.  MUSCULOSKELETAL:  Strength was generally 4/5.  HEART:  Regular.  CHEST:  Clear.  The patient ambulated for me today and was a little bit  wide-based and  has fair balance, however.  I had her bend lumbar spine  and she can flex to approximately 45 degrees, of course she has pain and  loses balance.  Extension was achieved to 15 degrees and she can rotate  and laterally bend to either side to approximately 15 degrees.  Multiple  tender points were appreciated today.   ASSESSMENT:  1. Fibromyalgia syndrome.  2. Bilateral rotator cuff syndrome/degenerative joint disease of the      shoulders.  3. Left temporal arteritis.  4. Obesity.  5. Depression and insomnia.   PLAN:  1. I made a referal to Rheumatology to assess her further treatment      for temporal arteritis.  There was some discussion of her      contralateral artery biopsy.  I think it would be appropriate to      have Rheumatology make some comments here and the patient would      prefer this as well.  2. Continue methadone 10 b.i.d. with Norco 10/325 for breakthrough      pain.  3. Continue Effexor, Flexeril, and Elavil.  I refilled the latter two      today.  4. Discussed further options medicinally for her general pain and      fibromyalgia treatments based on her      formulary.  She will bring this in at the next visit.  5. I will see her back in 2 months.  She will call  regarding the      prescriptions here in about a month.      Ranelle Oyster, M.D.  Electronically Signed     ZTS/MedQ  D:  02/25/2008 16:15:52  T:  02/26/2008 02:24:11  Job #:  [id number]

## 2010-09-21 NOTE — Assessment & Plan Note (Signed)
Morgan Wilkerson is back regarding her fibromyalgia and shoulder pain.  She is  doing about the same.  She states that she overdid it a week ago, and  she seems to have had more pain which is now decreasing since she has  decreased her activity accordingly.  She rates her pain as 7/10 on  average.  She states today that her pain is a 5/10.  She describes her  pain as something that interferes with general activity, relations with  others, and enjoyment of life on a moderate level.  Sleep is fair.  The  pain seems to be most focal, again in the left shoulder, with associated  swelling.   REVIEW OF SYSTEMS:  Notable for multiple items which are listed in the  health and history section of the chart.  Other issues are noted above.   SOCIAL HISTORY:  Patient is married and no changes are noted today.   PHYSICAL EXAMINATION:  VITAL SIGNS:  Blood pressure is 142/73.  Pulse is  93.  Respiratory rate is 17.  She is satting at 97% on room air.  GENERAL:  Patient is generally pleasant, alert and oriented x3.  NEUROMUSCULAR:  She remains tender in both shoulders, particularly the  left subacromial space today.  A positive rotator cuff sign.  HEART:  Regular.  CHEST:  Clear.  ABDOMEN:  Soft and nontender.  She remains overweight.  She has multiple  tender points throughout the body, once again, today.   ASSESSMENT:  1. Fibromyalgia syndrome.  2. Occipital tendinitis and rotator cuff syndrome bilaterally.  3. Chronic headaches.  4. Inguinal neuropathic pain.  5. Obesity.  6. Depression.  7. Insomnia.  8. Osteoarthritis of the knees.  9. De Quervain's tenosynovitis.   PLAN:  1. Continue methadone 10 t.i.d.  2. Norco 10/325 for breakthrough pain.  3. After informed consent, we injected the left shoulder via the      posterior approach with 40 mg of Kenalog and 3 cc of 1% lidocaine.  4. Encouraged supplementation.  5. Continue Effexor, Flexeril, and Elavil for mood and pain control.  6. I encouraged  ongoing activity, stretching, and range of motion.      She may do well with some Aleve or another anti-inflammatory prior      to increased activity or shortly thereafter.      Ranelle Oyster, M.D.  Electronically Signed    ZTS/MedQ  D:  11/15/2006 12:54:01  T:  11/16/2006 01:27:19  Job #:  [id number]

## 2010-09-21 NOTE — Assessment & Plan Note (Signed)
Morgan Wilkerson is back regarding her multiple pain complaints.  She states that  she has been trying to exercise more and walk.  She is getting up to  about 30 minutes a day approximately on her treadmill.  She does not  know how far she is going.  She judges it by the length of the  television show on TV.  She does state that, with her increased walking,  she is having more knee pain.  She is also having her familiar left  shoulder pain and swelling.  She is using methadone for general pain  control and Norco for breakthrough.  She uses Flexeril and Elavil for  sleep and spasm at night.  She remains on Effexor, but is now switched  back to the generic Effexor due to cost.  The patient rates her pain as  5/10 to 7/10, describes it as sharp, stabbing, tingling, and aching.  Her knees bother her when she bears weight and she experiences some  grinding, particularly laterally in the compartment.   REVIEW OF SYSTEMS:  The patient reports spasms, dizziness, tingling,  numbness, occasional confusion, depression, urinary retention, abdominal  pain, bladder spasms.  Other issues mentioned above and full review is  in the written health and history section of the chart.  Social history  is without significant change today.   PHYSICAL EXAM:  Blood pressure 133/54, pulse 87, respiratory rate 16.  She is satting 98% on room air.  The patient is pleasant.  Generally alert and oriented x3.  Affect  remains a bit flat as usual.  She is antalgic with her gait on either side.  She had meniscal pain  laterally with meniscal maneuvers today.  No joint instability was  appreciated.  She has fair strength with questionable effort on testing  today.  I felt no crepitus or grinding in the joints on exam.  No  appreciable swelling was noticed.  She had pain in the left short-head  biceps tendon today at the insertion or origination of the coracoid.  No  appreciable swelling was seen at the chest, although the  pectoralis  muscles were tight.  Cognitively, she is generally appropriate.  HEART:  Regular rate.  CHEST:  Clear.  She remains overweight.   ASSESSMENT:  1. Fibromyalgia syndrome.  2. Bicipital tendinitis and rotator cuff syndrome bilaterally, left      greater than right.  3. Chronic headaches.  4. Inguinal neuropathic pain.  5. Obesity.  6. Depression.  7. Insomnia.  8. Osteoarthritis of the knees.  9. Rales, wheezes, or rhonchi De Quervain's tenosynovitis.   PLAN:  1. Methadone 10 mg t.i.d. and Norco 10/325 for breakthrough pain      p.r.n.  2. Continue working on exercise and weight loss.  3. Encouraged glucosamine and chondroitin for joint health.  We also      injected both knees today with 40 mg of Kenalog and 3 cc of 1%      lidocaine.  The patient tolerated it well without issue.  4. I injected the left short-head biceps tendon at the origination      with the same solution as above.  The patient tolerated without      issue.  Recommended specific stretches for her pectoralis muscles,      as well as shoulder today, as we have done in the past.  5. Continue Effexor, Flexeril, and Elavil for mood and pain control.  6. I will see her back in about 3  months' time with nurse clinic in 1      month for followup.  Will obtain x-rays recently taken in the ER of      her knees as well for review.      Ranelle Oyster, M.D.  Electronically Signed     ZTS/MedQ  D:  09/22/2006 12:29:22  T:  09/22/2006 13:39:56  Job #:  [id number]

## 2010-09-24 NOTE — Assessment & Plan Note (Signed)
Tuesday, April 25, 2006   Sasha was given some naproxen by her [facility] clinicians, and felt  that this was helpful for pain.  She has had some increased problems of  late in the right wrist, and specifically the knee.  The knee has  buckled on a few occasions.  The shoulders have been doing a bit better.  She still has more pain in the right shoulder than the left, but it is  inconsistent.  She rates her pain as 7 out of 10.  Describes it as  sharp, intermittent, stabbing.  Pain interferes with general activity,  relations with others, and enjoyment of life on a moderate to severe  level.  Sleep is fair.  Pain increases with walking, bending, sitting,  and activity.  Improves with rest, heat, and pacing.   REVIEW OF SYSTEMS:  Patient reports numbness, tremor, spasm, dizziness,  depression, anxiety, confusion, constipation.  Mood has been actually  fair in comparison.   SOCIAL HISTORY:  Without significant change.  She tries to stay somewhat  active with exercise and activities.   PHYSICAL EXAMINATION:  Blood pressure is 132/67, pulse is 88,  respiratory rate is 16.  She is satting 99% on room air.  Patient is  generally pleasant.  Affect is bright.  Gait is antalgic to the right.  Patient has some pain with peripatellar palpation.  No anterior or  posterior instability was appreciated.  Meniscal maneuvers were slightly  provocative today.  No crepitus was appreciated.  No swelling was seen.  Right wrist was tender upon palpation of the extensor pollicis tendons.  No swelling, color, or temperature changes were appreciated.  No  numbness was seen.  Bilateral shoulders were tender to rotator cuff  impingement, maneuvers and palpation along the bicipital tendons, but  lesser so than they had been previously.  Motor function is generally  5/5, except for areas where pain was provoked.  Reflexes were 2+.  Sensation was intact.  Cranial nerve exam was unremarkable.   ASSESSMENT:  1. Fibromyalgia syndrome.  2. Bilateral bicipital tendinitis and rotator cuff syndrome.  3. Chronic headaches.  4. Inguinal neuropathic pain.  5. Obesity.  6. Depression.  7. Insomnia.  8. Right knee pain/possible osteoarthritis.  9. Right wrist pain consistent with de Quervain tenosynovitis.   PLAN:  1. Recommend continuing naproxen.  She may purchase this over the      counter.  For now, a prescription is not needed.  2. We will check x-rays of the right knee to rule out degenerative      disease.  Consider injections for now.  Continue naproxen and add      glucosamine supplementation.  Refill methadone and Norco, #16 and      #120 respectively.  3. Flexeril for sleep as well as Elavil.  4. Encourage weight loss and exercise.  5. I will see the patient back in 2 months' time.  Patient will see      the nurse clinic in 1 month.      Ranelle Oyster, M.D.  Electronically Signed     ZTS/MedQ  D:  04/25/2006 12:35:10  T:  04/25/2006 14:53:48  Job #:  [id number]

## 2010-09-24 NOTE — Assessment & Plan Note (Signed)
INTERVAL HISTORY:  Morgan Wilkerson is back regarding her fibromyalgia syndrome.  We  have her managed pretty well with the methadone and other medications.  She  has had problems over the last month or so with increased left shoulder  pain.  She has well documented right rotator cuff disease and degenerative  arthritis of the shoulder.  Left arm has been bothering her more so as well.  She saw her family physician last week who injected the left shoulder with  Depo-Medrol and she has had some results.  He also recommended a home  exercise program.  MRI revealed a small left supraspinatus tendon tear.  She  has had some relief from the injection but still has significant left  shoulder pain.  She has also had problems with some swelling in her breasts  and enlarged lymph nodes however, workup has been negative and swelling  seems to have improved.   The patient still rates her pain at 5-6/10.  Pain involves both shoulders  primarily today, left greater than right.  Shoulders bother with activity,  particularly with twisting and overhead activities.  Pain improves some with  rest and medications.  She was given some Dilaudid for breakthrough pain and  Mobic 7.5 mg daily which has helped with the swelling particularly.   REVIEW OF SYSTEMS:  On review of systems the patient reports numbness,  tremor, tingling, spasms, depression/anxiety which is improved, fever,  chills, abdominal pain, limb swelling.   SOCIAL HISTORY:  The patient is living with her daughter currently.   PHYSICAL EXAMINATION:  Blood pressure is 124/82, pulse 74, respiratory rate  is 18.  The patient is saturating 99% in room air.  She is pleasant in no  acute distress.  Heart is regular rate and rhythm.  Lungs are clear.  Abdomen soft, nontender.  Gait stable.  Affect is bright and appropriate.  Coordination is fair.  Reflexes are 2+.  Sensation is generally normal.  She  has positive impingement signs in both shoulders.  The left  shoulder also  had significant stenosis along the short head biceps tendon.  Speed's test  was positive.  She had some noticeable swelling of the short head tendon  today with palpation.  Adduction strength of the shoulder was 3+ to 4/5.  Distal arm strength was 4+ to 5/5.  Right shoulder strength was stable and  somewhat limited due to her pain there.  Extremities showed no clubbing or  cyanosis and trace edema.  Cognitively she was intact.  She had multiple  other tender points but these were much less severe than they have been in  the past.   ASSESSMENT:  1.  Fibromyalgia with myofascial features.  2.  Chronic headaches.  3.  Inguinal neuropathic pain.  4.  Left rotator cuff tear and bicipital tendinitis.  5.  Obesity.  6.  Depression.  7.  Insomnia.  8.  Old right rotator cuff and degenerative joint disease of the shoulder.   PLAN:  1.  Continue methadone at the current dose of 10 mg twice daily.  2.  Write for Norco 10/325 for breakthrough pain.  3.  I refilled the meloxicam 7.5 mg daily.  4.  After informed consent, we injected the left bicipital tendon (short      head) with 40 mg of Kenalog and 3 mL of 1% lidocaine.  The patient      tolerated this well.  Advised ice to the shoulder.  We will also send  her to occupational therapy to work on shoulder and scapular      stabilization exercises, modalities, and home exercise program.  5.  Consider surgical followup as she has had problems with the right      shoulder before.  She may need surgical      input regarding her right shoulder disease.  6.  I will see the patient back in about 1 month's time.      Ranelle Oyster, M.D.  Electronically Signed     ZTS/MedQ  D:  07/18/2005 13:26:12  T:  07/19/2005 11:08:30  Job #:  [id number]   cc:   Claire Shown, MD

## 2010-09-24 NOTE — Procedures (Signed)
. [facility]  Patient:    Morgan Wilkerson                        MRN: [id number] Proc. Date: 05/05/99 Adm. Date:  [id number] Attending:  Louie Bun CC:         Maris Berger. Pennie Rushing, M.D.                           Procedure Report  PROCEDURE:  Colonoscopy.  INDICATIONS FOR PROCEDURE:  Intermittent rectal bleeding with anemia with a hemoglobin of 11.5.  DESCRIPTION OF PROCEDURE:  The patient was placed in the left lateral decubitus  position and placed on the pulse monitor with continuous low-flow oxygen delivered by nasal cannula.  She was sedated with 50 mg IV Demerol and 5 mg of IV Versed.  The Olympus video colonoscope was inserted into the rectum and advanced to the cecum, confirmed by transillumination of McBurneys point and visualization of the ileocecal valve and appendiceal orifice.  The prep was excellent.  The cecum, ascending, transverse, descending, and sigmoid colon all appeared normal with no masses, polyps, diverticula, or other mucosal abnormalities.  The rectum likewise appeared normal, and retroflexed view of the anus did reveal some small internal hemorrhoids.  The colonoscope was then withdrawn, and the patient was returned o the recovery room in stable condition.  She tolerated the procedure well and there were no immediate complications.  IMPRESSION:  Internal hemorrhoids, otherwise normal colonoscopy.  PLAN:  Treat hemorrhoids symptomatically. DD:  05/05/99 TD:  05/05/99 Job: [id number]

## 2010-09-24 NOTE — Assessment & Plan Note (Signed)
FOLLOWUP CLINIC NOTE  - Sep 28, 2005.   SUBJECTIVE:  Morgan Wilkerson is back regarding her fibromyalgia syndrome and left  shoulder pain.  She did very nicely with the left shoulder injections.  She  had to stop therapy as she became sick and has not resumed as of yet.  She  is still having some problems with sleeping and grogginess in the morning.  She is on methadone 10 mg b.i.d., Mobic 7.5 mg daily and Effexor XR 75 mg  two tablets in the morning, one at bedtime.  She is on Elavil 100 mg q.h.s.  The patient still feels some anxiety and depression.  She is separated from  her husband which has caused extra stress in her life.   Her pain ranges from a 5 to 6 out of 10 and is intermittent.  She states the  pain increases with walking, bending, sitting and activities.  The pain  interferes with general activity, relations with others, enjoyment of life  on a mild to moderate level.  Sleep is fair.  Pain improves with rest, heat,  medications, injections, pacing.  The patient can walk about 45 minutes  without stopping but she is rarely getting out and exercising at this point.  Usually gets going with some of her stretches in the afternoon but rarely  does much physically until lunch time.   REVIEW OF SYSTEMS:  Positive for bladder control problem, weakness,  numbness, tremor, trouble walking, spasm, dizziness, depression, anxiety,  weight gain, labile sugars, skin rash, constipation at times, abdominal pain  and limb swelling.   SOCIAL HISTORY:  Pertinent positives as listed above.   PHYSICAL EXAMINATION:  VITAL SIGNS:  Blood pressure 139/73, pulse 82,  respiratory rate 16.  She is sating 99% on room air.  GENERAL APPEARANCE:  The patient is pleasant, in no acute distress.  She  appears fatigued and seems to have difficulty keeping her eyes open.  Gait  is stable.  MUSCULOSKELETAL:  Left shoulder is much more appropriate with passive  movement today.  She still has some pain at bicipital  tendons, but this  appears dramatically improved.  Right shoulder is within normal limits.  Posture is fair though she still sits with her shoulders curved, head  forward posture.  Motor function is generally 4+-5/5.  Reflexes are 2+.  Coordination is fair.  Sensation appears to be within normal limits.  Cognitively, other than being fatigued, she seems to be appropriate and  follows commands.   ASSESSMENT:  1.  Fibromyalgia syndrome.  2.  Left bicipital tendinitis and rotator cuff syndrome.  3.  Chronic headaches.  4.  Inguinal neuropathic pain.  5.  Obesity.  6.  Depression.  7.  Insomnia.  8.  Old right rotator cuff disorder.   PLAN:  1.  Will change her medications around, modifying her Effexor so that she      takes 75 mg three tablets in the morning.  Will decrease her Elavil to      50 mg q.h.s. and add Lunesta 2 mg q.h.s. if needed for sleep.  2.  Continue methadone 10 mg b.i.d.  3.  Continue stimulant.  4.  Send patient to complete therapies at [facility] on      left shoulder.  Also would like to start the [employer] therapy      series to get her started on a regular aerobic exercise program in      relation to her fibromyalgia.  5.  Patient may continue with her meloxicam 7.5 mg daily for now as well as      Vicodin for breakthrough pain.  6.  Will see the patient back in about one month's time.      Ranelle Oyster, M.D.  Electronically Signed     ZTS/MedQ  D:  09/28/2005 12:42:15  T:  09/28/2005 17:21:39  Job #:  [id number]   cc:   Dellia Beckwith, M.D.  Fax: [phone]

## 2010-09-24 NOTE — Assessment & Plan Note (Signed)
MEDICAL RECORD NUMBER:  [id number].   Morgan Wilkerson is back regarding her fibromyalgia and chronic pain. She has been  doing fairly well except for new right hand pain along the wrist. Her pain  level on an average is a 6/10. She describes her pain as constant, burning,  and stabbing. Still lacks energy and motivation for the most part. She does  try to get out of the house and do some walking two or three days a week.  When she leaves the house for social activities, she is very limited in her  physical tolerance. She states that she felt the Topamax helped more with  her pain although it decreased her daytime arousal. She reports her sleep is  fair.   The patient states that pain in the right hand is worse with activity and  manipulation. She had some swelling initially. She tried heat and Icy-Hot  wraps to the wrist. She has been using a wrist sleeve as well.   SOCIAL HISTORY:  The patient continues on disability. She is married.   REVIEW OF SYSTEMS:  The patient reports numbness, tingling spasms,  dizziness, bladder control problems, depression, anxiety, suicidal thoughts  in the past, labile sugars, constipation, abdominal pain, limb swelling,  cold intolerance.   PHYSICAL EXAMINATION:  Physical examination today, the patient generally is  pleasant and of a fair affect. Blood pressure is 159/79, pulse 102,  respiratory rate 20. She is saturating 99% on room air. The patient remains  moderately obese.  HEART:  Regular rate and rhythm.  CHEST:  Clear.  EXTREMITIES:  Reflexes are 2+. Sensation is grossly intact. Coordination is  fair.  MUSCULOSKELETAL:  The patient has multiple tender points along the  shoulders, back, wrists, hips, knees. The right wrist is tender along the  radial head and at the tendinous insertion. Pain increased with palpation as  well as with resisted extension of the thumb. No focal swelling was noted.  No skin color changes were noted either. Cognitively, the  patient was  appropriate. Neurological exam was stable.   ASSESSMENT:  1.  Fibromyalgia.  2.  Myofascial pain.  3.  New right wrist pain. Rule out radial head occult fracture versus      tendinitis.  4.  Headaches.  5.  History of inguinal neuropathic pain.  6.  Obesity.  7.  Depression.  8.  Insomnia.   PLAN:  1.  Continue with hydrocodone 5/500 and Ultram for breakthrough pain one to      two times a day as needed.  2.  Will begin a trial of Lyrica 50 mg q.h.s. for one week, then up to      b.i.d. for one week, then t.i.d. thereafter. We discussed the side      effect profile today.  3.  Would like to obtain some fibromyalgia screening labs including a      cortisol, estrogen, testosterone, progesterone, and DHEA level. Also      check a CMET, magnesium, CBC, and thyroid function panel.  4.  Encouraged activity as tolerated.  5.  Encouraged ice to the right wrist. Gave her nine days of Celebrex 200 mg      q.d. for anti-inflammatory effect.  6.  Will see the patient back in one month's time.      ZTS/MedQ  D:  09/10/2004 10:59:38  T:  09/10/2004 11:24:11  Job #:  [id number]

## 2010-09-24 NOTE — H&P (Signed)
[facility]  Patient:    MADELEIN, MAHADEO Visit Number: [id number] MRN: [id number]          Service Type: DSU Location: 9300 [id number] Attending Physician:  Shaune Spittle Dictated by:   Henreitta Leber, P.A. Admit Date:  09/11/2001                           History and Physical  INCOMPLETE DICTATION  HISTORY OF PRESENT ILLNESS:   Ms. Selvage is a 55 year old, married, African-American female, para 1-0-0-1 with a longstanding history of heavy irregular bleeding. Dictated by:   Henreitta Leber, P.A. Attending Physician:  Shaune Spittle DD:  09/10/01 TD:  09/10/01 Job: [id number]

## 2010-09-24 NOTE — Op Note (Signed)
[facility]  Patient:    Morgan Wilkerson, Morgan Wilkerson Visit Number: [id number] MRN: [id number]          Service Type: DSU Location: 9300 [id number] Attending Physician:  Shaune Spittle Dictated by:   Maris Berger. Pennie Rushing, M.D. Proc. Date: 09/11/01 Admit Date:  09/11/2001                             Operative Report  PREOPERATIVE DIAGNOSES:       Abnormal uterine bleeding, dysmenorrhea.  POSTOPERATIVE DIAGNOSES:      Abnormal uterine bleeding, dysmenorrhea.  OPERATION:                    Total vaginal hysterectomy.  ANESTHESIA:                   General orotracheal.  ESTIMATED BLOOD LOSS:         50 cc.  COMPLICATIONS:                None.  SURGEON:                      Vanessa P. Pennie Rushing, M.D.  FIRST ASSISTANT:              Henreitta Leber, P.A.  FINDINGS:                     The uterus was normal size.  The right ovary contained a functional follicular cyst and the left ovary was within normal limits.  PROCEDURE:                    The patient was taken to the operating room after appropriate identification and placed on the operating table.  After attainment of adequate general anesthesia she was placed in the lithotomy position.  The perineum and vagina were prepped with multiple layers of Betadine and a Foley catheter inserted into the bladder and connected to straight drainage.  The perineum was draped as a sterile field.  A weighted speculum was placed in the posterior vagina and Lahey tenaculum placed on the anterior and posterior lips of the cervix.  The cervicovaginal junction was then injected with a dilute solution of Pitressin and circumscribed.  The anterior and vaginal mucosa was dissected off the anterior cervix and the anterior peritoneum entered and tagged.  The posterior peritoneum was then entered and tagged.  The left and right uterosacral ligaments were then clamped, cut, and suture ligated and those sutures held.  The  paracervical tissues and parametrial tissues were then clamped, cut, and suture ligated. The uterus was inverted and the upper pedicles clamped, cut, tied with a free tie, and suture ligated with those sutures held.  A McCall culdoplasty suture was then placed in the posterior cul-de-sac incorporating both uterosacral ligaments and the intervening posterior peritoneum.  A hemostatic suture was placed in the area of the right uterosacral ligament and hemostasis was noted to be adequate.  The held sutures on the upper pedicles were cut and the vaginal cuff closed using the uterosacral sutures as vaginal angles and then closing the remainder of the cuff in a single layer incorporating the anterior vaginal mucosa, anterior peritoneum, posterior peritoneum, and posterior vaginal mucosa in figure-of-eight sutures all of 0 Vicryl.  Hemostasis was noted to be adequate.  The culdoplasty suture was tied down.  Vaginal packing which had been  moistened with Estrace cream was then placed in the vagina and the patient awakened from general anesthesia, then taken to the recovery room in satisfactory condition having tolerated the procedure well with sponge and instrument counts correct.  SPECIMEN:                     Uterus and cervix. Dictated by:   Maris Berger. Pennie Rushing, M.D. Attending Physician:  Shaune Spittle DD:  09/11/01 TD:  09/12/01 Job: [id number]

## 2010-09-24 NOTE — Assessment & Plan Note (Signed)
DATE OF VISIT:  August 16, 2005.   Erva is back regarding her fibromyalgia syndrome.  We also addressed her  left shoulder, which was tender last visit.  We injected the left short head  biceps tendon with some results.  We also sent her to therapy.  Her pain  seems somewhat reduced today but still is a major factor.  She rates her  pain at a 5 to 6 out of 10.  Her other fibromyalgia and general pain is  under fair control.  She states that the pain in the left shoulder is sharp  and constant and  interferes with general activity, relationships with  others, and enjoyment of life on a moderate level.  Sleep is fair.  Pain  generally increases with any type of activity using the arm and shoulder  areas.   REVIEW OF SYSTEMS:  The patient reports bladder control problems, weakness,  numbness, tremor, spasms, dizziness, depression, anxiety, labile sugars,  weight gain, fever, chills, constipation, recent sinus infection.   SOCIAL HISTORY:  The patient lives with her daughter and is not working  currently.   PHYSICAL EXAMINATION:  VITAL SIGNS:  Blood pressure is 130/77, pulse is 92,  respiratory rate is 16, saturation is 99% on room air.  GENERAL:  The patient is pleasant and in no acute distress.  She is alert  and oriented x3.  Affect is bright and appropriate.  PAIN AND REHAB EVALUATION:  Gait is generally stable.  Coordination is fair,  reflexes are 2+, and sensation is within normal limits.  Motor function is  4+ to 5 out of 5 except the left shoulder, which is 3+ to 4 out of 5.  Small  head of the biceps remained tender at the coracoid process.  Impingement  signs are minimal.  Long head biceps was nontender.   ASSESSMENT:  1.  Fibromyalgia syndrome.  2.  Chronic headaches.  3.  Inguinal neuropathic pain.  4.  Left rotator cuff syndrome with bicipital tendinitis.  5.  Obesity.  6.  Depression.  7.  Insomnia.  8.  Old right rotator cuff disorder.   PLAN:  1.  After informed  consent, we injected the left short head biceps tendon      with 40 mg of Kenalog and 3 ml of 1% Lidocaine.  The patient tolerated      this well.  2.  Continue Vicodin for breakthrough pain (5/500).  Patient did not      tolerate Norco 10.  3.  Continue Meloxicam 7.5 mg daily.  4.  Methadone will stay at 10 mg b.i.d.  5.  Patient needs to complete therapy.  6.  I will see the patient back in about one month's time.      Ranelle Oyster, M.D.  Electronically Signed     ZTS/MedQ  D:  08/16/2005 16:08:14  T:  08/16/2005 22:00:33  Job #:  [id number]   cc:   Dellia Beckwith, M.D.  Fax: [phone]

## 2010-09-24 NOTE — Assessment & Plan Note (Signed)
HISTORY OF PRESENT ILLNESS:  Morgan Wilkerson is back regarding her fibromyalgia and  chronic pain syndrome. 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 The patient continues to complain of some pain at the  wrist joint. She describes her pain overall as a 7 out of 10. It involves  the wrist, shoulder, back, inguinal region and most other areas. Sleep is  fair. She also feels groggy during the morning and feels worse when she  takes her medications including her pain medications and beta blocker. She  also uses Effexor in the morning. Pain increases with walking, bending,  sitting, standing, activity and improves with heat, ice, medications, and  injections. The patient is on disability and continues to live with her  husband.   REVIEW OF SYSTEMS:  The patient reports numbness, tremor, tingling, spasm,  dizziness, occasional depression and anxiety. She has had a history of  suicidal thoughts but none recently. Denies sweats or skin rash. Has had  some fluctuation in her sugars. Does report fever, weight gain, nausea,  vomiting and limb swelling.   PHYSICAL EXAMINATION:  VITAL SIGNS:  Blood pressure 136/63, pulse 89,  respiratory rate 18. She is sating 98% on room air.  GENERAL:  The patient is actually pleasant and appropriate today. Weight  appears stable. She is alert and oriented x3. Gait is normal.  HEART:  Regular rate and rhythm.  LUNGS:  Clear.  ABDOMEN:  Soft, nontender.  EXTREMITIES:  The patient has multiple tender points throughout the  extremities and axial skeletal. The patient had pain once again at the  thumb, especially with extension. This seemed to be more localized around  the extensor pollicis tendons. No focal swelling was noted.  SKIN:  No skin color changes was noted.  NEUROLOGIC:  Cognitively, the patient was appropriate. Examination was  intact.   ASSESSMENT:  1.   Fibromyalgia with myofascial features.  2.  Right wrist pain. Seems to be more consistent with extensor pollicis      tendonitis.  3.  History of headaches.  4.  History of inguinal neuropathic pain.  5.  Obesity.  6.  Depression.  7.  Insomnia.   PLAN:  1.  After informed consent, we injected the insertion of the extensor      pollicis tendons using 1.5 cc of 1% Lidocaine and 0.75 cc of Kenalog.      The patient tolerated this well without side effects. She was advised to      apply ice over the area.  2.  I refilled the hydrocodone and Ultram today, #75 and #30 respectively.  3.  May consider a trial of Lyrica once again in the future, depending on      her ability to pay for the medication. There is no drug assistance      program available yet for this apparently.  4.  A depressed DHEA level on laboratory screening. We will begin      supplementation with 25 mg b.i.d. We discussed side effects and effects      of the medication. Will titrate as needed.  5.  Encourage activity.  6.  Will see her back in 2 months time.       ZTS/MedQ  D:  10/12/2004 16:12:18  T:  10/12/2004 23:20:30  Job #:  [id number]

## 2010-09-24 NOTE — Assessment & Plan Note (Signed)
MEDICAL RECORD NUMBER:  [id number].   Morgan Wilkerson is back regarding her fibromyalgia syndrome. She has done fairly well  with a small dose of methadone at night time. She has a hard time tolerating  the full 10 mg as she has a rash secondary to medications. She usually uses  5 mg without side effects. She only uses the medication at night. She feels  her DHEA has helped both her endurance and exercise tolerance. She uses  tramadol and hydrocodone for breakthrough pain. The patient rates her pain  on average as a 7/10; describes it as sharp, intermittent and aching. The  right shoulder is most painful. She also has had pain in the arms. She  developed some numbness and pain in the wrists and in the hands when she  uses them for prolonged activities. She likes to do a lot of crocheting, and  this seems to make pain worse. Pain generally is most severe in the morning  when she awakes but also increases with general activity.   REVIEW OF SYSTEMS:  The patient reports numbness, tremor, tingling, spasms,  depression, anxiety, weight gain, fever, nausea, rash. Other pertinent  positives listed above.   SOCIAL HISTORY:  The patient is married, and family remains supportive.   PHYSICAL EXAMINATION:  VITAL SIGNS:  Blood pressure is 136/66, pulse 81,  respiratory rate 16. She is saturating 98% on room air.  GENERAL:  The patient is pleasant, remains overweight.  HEART:  Regular rate and rhythm.  LUNGS:  Clear.  ABDOMEN:  Soft and nontender.   The patient is alert and oriented x3. Affect is bright and appropriate. Gait  is stable. Reflexes are 2+, and coordination is normal. Sensation appears to  be 2/2. Motor function is 5/5. She has multiple tender areas. However, she  has intact range of motion, actively and passively.   ASSESSMENT:  1.  Fibromyalgia with myofascial features.  2.  Right wrist pain which is improved.  3.  Headaches.  4.  Inguinal neuropathic pain.  5.  Obesity.  6.   Depression.  7.  Insomnia.   PLAN:  1.  She will continue with the methadone. I recommend trying a Benadryl with      the methadone to see if she can tolerate the 10-mg dose better without      rash.  2.  Continue Ultram and Vicodin for breakthrough pain. I refilled Ultram      today.  3.  She will use her DHEA for exercise tolerance and pain relief as well. We      discussed multiple other supplements and measures today. I would like to      see her continue with her exercises and work on her weight.      She can use Elavil at night for sleep as well.  4.  I will see patient back in three months' time in followup.      Ranelle Oyster, M.D.  Electronically Signed     ZTS/MedQ  D:  04/19/2005 16:28:51  T:  04/20/2005 08:44:00  Job #:  [id number]

## 2010-09-24 NOTE — Procedures (Signed)
NAME:  Morgan Wilkerson, Morgan Wilkerson                ACCOUNT NO.:  [id number]   MEDICAL RECORD NO.:  [id number]          PATIENT TYPE:  OUT   LOCATION:  SLEEP CENTER                 FACILITY:  [facility]   PHYSICIAN:  Clinton D. Maple Hudson, M.D. DATE OF BIRTH:  25-Jul-1955   DATE OF STUDY:  03/10/2004                              NOCTURNAL POLYSOMNOGRAM   INDICATION FOR STUDY:  Hypersomnia with sleep apnea.  Epworth Sleepiness  Score 10/24, neck size 15 inches, BMI 34, weight 200 pounds.   SLEEP ARCHITECTURE:  Short total sleep time with sleep onset 1:30 a.m.  Total sleep time was 280 minutes with sleep efficiency 78%, stage 1 11%,  stage 2 78%, stages 3 and 4 12%, REM was absent.  Latency to sleep onset 109  minutes.  Awake after sleep onset 4 minutes.  Arousal index 18.  Bedtime  medication included hydrocodone/APAP, amitriptyline.  She is listed as  taking other medications such as Propranolol, which has been associated with  sleep disturbance and nightmares, Tramadol, and Effexor.   RESPIRATORY DATA:  RDI 1.9 per hour, which is within normal limits and  reflected a total of 9 hypopneas during the study.  Events were not  positional.   OXYGEN DATA:  Mild to moderate snoring with oxygen desaturation to a nadir  of 89% with respiratory event in stage 2.  Mean oxygen saturation through  the study night was 94% on room air.   CARDIAC DATA:  Normal sinus rhythm.   MOVEMENT/PARASOMNIA:  A total of 74 limb jerks were recorded of which 26  were associated with arousal or awakening, for a periodic limb movement with  arousal index of 5.6 per hour, which is somewhat increased.   IMPRESSION/RECOMMENDATION:  Occasional hypopneas, without significant sleep  disordered breathing.  RDI was 1.9 per hour.  Oxygen saturation was  generally well maintained with very brief dips to 89%, but mean saturation  of 94%.  Periodic limb movement with arousal may contribute to sleep  disturbance.  Her index was 5.6 arousals due to  limb jerks per hour, and  this might respond to treatment with clonazepam or Requip if otherwise  appropriate.  Periodic limb jerks can be increased by tricyclic  antidepressants such as amitriptyline in some situations.                                                           Clinton D. Maple Hudson, M.D.  Diplomate, [employer]   CDY/MEDQ  D:  03/21/2004 10:11:59  T:  03/21/2004 13:56:39  Job:  [id number]

## 2010-09-24 NOTE — Op Note (Signed)
[facility]  Patient:    Morgan Wilkerson                        MRN: [id number] Proc. Date: 05/18/99 Adm. Date:  [id number] Attending:  Dierdre Forth Pearline                           Operative Report  PREOPERATIVE DIAGNOSIS:  Pelvic pain, menorrhagia, abnormal uterine bleeding.  POSTOPERATIVE DIAGNOSIS:  Pelvic pain, menorrhagia, abnormal uterine bleeding, ule out endometriosis.  OPERATION:  Diagnostic hysteroscopy, diagnostic D&C, diagnostic laparoscopy and  peritoneal biopsies.  SURGEON:  Vanessa P. Pennie Rushing, M.D.  COMPLICATIONS:  None.  ANESTHESIA:  General orotracheal.  ESTIMATED BLOOD LOSS:  Less than 25 cc.  FINDINGS:  Minimal tissue was obtained at the time of D&C and the endometrial cavity contained no irregularities.  At laparoscopy, a small uterine fibroid approximately 1 cm was noted on the posterior fundus of the uterus towards the eft side.  The remainder of the uterus was within normal limits.  The tubes were status post interruption for sterilization but no other abnormalities were noted.  The  right ovary contained a follicular cyst but no stigmata of endometriosis and no  adhesions.  The left ovary was normal.  The anterior peritoneum was normal. The posterior peritoneum contained a peritoneal window to the right of midline near the uterosacral ligament.  No other stigmata of endometriosis was noted.  DESCRIPTION OF PROCEDURE:  The patient was taken to the operating room after appropriate identification and placed on the operating table.  After the attainment of adequate general anesthesia, she was placed in the modified lithotomy position. The abdomen, perineum and vagina were prepped with multiple layers of Betadine. A Foley catheter was inserted into the bladder under sterile conditions and connected to straight drainage.  The abdomen and perineum were draped a sterile fields. he Graves speculum was placed in  the vagina and a single-toothed tenaculum placed n the anterior cervix.  The uterus was sounded to 7 cm.  The cervix was then dilated to a #27 dilator and the hysteroscope used to note the above findings.  The findings were then documented and a medium curet used to curet all quadrants of the uterus and the Randall stone forceps used to remove any other endometrial tissue. Postcurettage hysteroscopy revealed no further lesions.  All instruments were then removed from the vagina.  A single-toothed tenaculum was replaced with a Hulka tenaculum and the surgeons gloves changes.  The abdomen then became the focus of the surgical procedure.  The subumbilical area was infiltrated with a total of cc of 0.25% Marcaine.  The superpubic regions on the right and left side were then  infiltrated with 2 cc of 0.25% Marcaine on either side.  A subumbilical incision was made and a Veress cannula placed through that incision into the peritoneal cavity.  A pneumoperitoneum was created with 2.5L of CO2.  The Veress cannula was removed and the laparoscopic trocar placed through that incision into the peritoneal cavity.  The laparoscope was placed through the trocar sleeve.  A superpubic incision was made to the left of midline and the laparoscopic probe trocar placed through that incision into the peritoneal cavity under direct visualization.  The above noted findings were made and documented.  Biopsies of the peritoneal window were obtained and copious irrigation carried out.  Hemostasis was noted to be adequate and all  instruments were removed from the peritoneal cavity under direct visualization as the CO2 was allowed to escape.  The subumbilical nd superpubic incisions were then closed with subcuticular sutures of 3-0 Vicryl and sterile dressings applied.  The Hulka tenaculum and Foley catheter were removed and the patient awakened from general anesthesia, then taken to the recovery  room in satisfactory condition having tolerated the procedure well with sponge and instrument counts correct. DD:  05/18/99 TD:  05/18/99 Job: [id number]

## 2010-09-24 NOTE — Op Note (Signed)
   NAME:  Morgan Wilkerson, Morgan Wilkerson                          ACCOUNT NO.:  [id number]   MEDICAL RECORD NO.:  [id number]                   PATIENT TYPE:  AMB   LOCATION:  [facility]                                 FACILITY:  [facility]   PHYSICIAN:  Lindaann Slough, M.D.               DATE OF BIRTH:  July 21, 1955   DATE OF PROCEDURE:  07/18/2002  DATE OF DISCHARGE:                                 OPERATIVE REPORT   PREOPERATIVE DIAGNOSES:  Bladder stone and interstitial cystitis.   POSTOPERATIVE DIAGNOSES:  No bladder stone and interstitial cystitis.   PROCEDURE:  Cystoscopy, hydraulic bladder distention.   SURGEON:  Lindaann Slough, M.D.   ANESTHESIA:  General.   INDICATIONS FOR PROCEDURE:  The patient is a 55 year old female without is  known to have interstitial cystitis who has been complaining of suprapubic  pain. CT scan of the abdomen and pelvis showed done about two months ago  showed a 6 mm stone in the bladder. The patient states that she has not  passed a stone and continues to complain of suprapubic pain. She is  scheduled for cystoscopy and stone extraction if the stone is still present  in the bladder.   DESCRIPTION OF PROCEDURE:  Under general anesthesia, the patient was prepped  and draped and placed in the dorsal lithotomy position. A #22 Wappler  cystoscope was inserted in the bladder. The bladder mucosa is normal. There  is no stone or tumor in the bladder. The ureteral orifices are in normal  position and shape with clear efflux. There is evidence of submucosal  hemorrhage. The bladder was then distended for about 10 minutes. The bladder  capacity is about 650 mL. The bladder was then emptied and the cystoscope  removed. A red Robinson catheter was then passed in the bladder and a  mixture of 400 mg of Pyridium and 15 mL of 0.5% Marcaine was instilled in  the bladder.   The red Robinson catheter was then removed.   The patient tolerated the procedure well and left the OR in  satisfactory  condition to post anesthesia care unit.                                               Lindaann Slough, M.D.    MN/MEDQ  D:  07/18/2002  T:  07/18/2002  Job:  [id number]

## 2010-09-24 NOTE — Discharge Summary (Signed)
[facility]  Patient:    Morgan Wilkerson, Morgan Wilkerson Visit Number: [id number] MRN: [id number]          Service Type: DSU Location: [id number] Attending Physician:  Shaune Spittle Dictated by:   Henreitta Leber, P.A.-C Admit Date:  09/11/2001 Discharge Date: 09/12/2001                             Discharge Summary  DISCHARGE DIAGNOSIS:  Abnormal uterine bleeding.  OPERATION:  On the date of admission, the patient underwent a total vaginal hysterectomy, tolerated the procedure well.  HISTORY OF PRESENT ILLNESS:  Morgan Wilkerson is a 55 year old married African-American female, para 1-0-0-1, with a longstanding history of heavy irregular menstrual bleeding, dysmenorrhea, and chronic pelvic pain which is complicated by a long history of chronic abdominal wall pain, who presents for hysterectomy.  Please see the patients dictated history and physical examination for details.  PHYSICAL EXAMINATION:  VITAL SIGNS:  Blood pressure 120/90, weight 206.5 pounds, height 5 feet 4.5 inches tall.  GENERAL:  Within normal limits.  ABDOMEN:  The abdomen is soft with positive bowel sounds, however, it is diffusely tender in all quadrants with guarding.  There is no rebound or organomegaly.  PELVIC:  EGBUS is within normal limits.  Vagina is rugous.  Cervix is nontender without lesions.  Uterus appears normal size, shape, and consistency, though difficult to assess secondary to the patients habitus and exquisite abdominal wall tenderness.  Right adnexa without tenderness or masses.  Left adnexa without any palpable masses, however, it is tender. Rectovaginal examination confirms.  HOSPITAL COURSE:  On the date of admission the patient underwent the afore mentioned procedure, tolerating it very well.  The patient was found to have a normal sized uterus, with normal appearing tubes and ovaries.  Postoperative course was unremarkable with the patient quickly  resuming bowel and bladder function by postoperative day #1, and deemed ready for discharge home. Postoperative hemoglobin was 9.2 (preoperative hemoglobin was 12.2).  DISCHARGE MEDICATIONS: 1. Stool softeners 100 mg one tablet b.i.d. until bowel movements are regular. 2. Phenergan 12.5 mg one tablet q.i.d. p.r.n. nausea. 3. Iron 325 mg one tablet b.i.d. x6 weeks. 4. Ibuprofen 600 mg with food q.6h. x5 days then p.r.n. 5. Dilaudid 1 mg one tablet q.4h. p.r.n. pain. 6. The patient was also instructed to resume her pre-hospital medications with    the exception of tizanidine until she has completed her Dilaudid course of    medication.  FOLLOWUP:  The patient has a six weeks postoperative appointment with Dr. Pennie Rushing on October 16, 2001, at 2:30 p.m.  DISCHARGE INSTRUCTIONS:  She was given a copy of [facility] postoperative instruction sheet.  She was further advised to avoid driving for two weeks, heavy lifting for four weeks, and intercourse for six weeks.  FINAL PATHOLOGY:  Uterus and cervix:  Cervix:  Slight cervicitis and squamous metaplasia, no dysplasia identified.  Weakly proliferative endometrium, no hyperplasia or malignancy identified.  Leiomyomata intramural, benign uterine serosa. Dictated by:   Henreitta Leber, P.A.-C Attending Physician:  Shaune Spittle DD:  09/27/01 TD:  09/30/01 Job: [id number]

## 2010-09-24 NOTE — Assessment & Plan Note (Signed)
MEDICAL RECORD NUMBER:  [id number]   HISTORY OF PRESENT ILLNESS:  Morgan Wilkerson's background is fibromyalgia and chronic  pain.  I have not seen her since February 06, 2004.  She has reported some  problems since that period.  In early October, she had severe headache come  on, which she calls a migraine.  She says she passed out leaving her car  going into the house.  She went to [facility] where she was  given some type of shot which helped.  She saw the [facility] which discontinued her hydrochlorothiazide and put her on  Metoprolol to hopefully help supposed vascular headache symptoms.  Headaches  were a bit better into December when she had sudden onset of right low back  pain with bending over.  She went to [facility] ER which she states did not  help her.  Her family took her to [facility] which diagnosed her with a  sprain after an MRI which revealed no focal abnormalities.  She was given  Valium as a muscle relaxant which seemed to help her.  She is currently  still taking Vicodin 5/500 1 q.6-8h. p.r.n. which is not doing a whole lot  for her.  Her pain level is rated in the 8-9/10.  Symptoms are worse with  walking, bending, sitting, working.  Therapy improved with rest, heat and  medication.  She has used some heat in the low back with mild results.  She  continues on Elavil 100 mg q.h.s. p.r.n. sleep as well as Effexor 225 mg  daily.   SOCIAL HISTORY:  No new changes.  She has not worked for over 2-1/2 years.   REVIEW OF SYSTEMS:  The patient reports shortness of breath, swelling,  anxiety, weakness, numbness, spasms, vertigo, confusion, blurred vision over  the last several months, problems with sleep, depression, agitation and  headaches.  She reports nausea, dysuria, reflux, constipation, swelling,  weight gain and rash.   PHYSICAL EXAMINATION:  VITAL SIGNS:  Blood pressure 143/71, pulse 112,  respirations 20, saturating 99% on room  air.  GENERAL APPEARANCE:  The patient walks with fairly stable gait.  She is  slightly wide based.  Affect is very flat and depressed.  Appearance is  normal for the most part.  She has significant tightness in clockwise  rotation of the lumbar spine and facets at L1 through L3 approximately.  Associated muscle spasm as well.  There is pain with palpation there also.  Pain seemed to be better with flexion and worse with extension and bending  to the left.  The patient has normal motor function of the upper and lower  extremities today with intact sensation.  NECK:  Generally tender with movement.  No focal abnormalities in the neck  were noted today.  HEENT:  Head was normal.  Crown nerve exam was intact.   ASSESSMENT:  1.  Fibromyalgia.  2.  Myofascial pain.  3.  New headaches which seem to be tension in type.  4.  Questionable inguinal neuropathic pain.  5.  Obesity.  6.  Depression.  7.  Insomnia.   PLAN:  1.  Will increase Metoprolol for heart rate and blood pressure relief and to      see if this helps headache symptoms any further.  She felt that there      was some relief with the initiation of his medication.  2.  I would like her to go for an eye  examination too as vision is blurry      and certainly could be adding to her tension headaches.  3.  She can use Hydrocodone 5/500 1-2 q.4-6h. for breakthrough headaches and      low back pain.  4.  For her low back, we injected right lumbar paraspinal area with 2 cc of      1% lidocaine.  The patient tolerated this well.  5.  I will give the patient of Lidoderm patches to use on the low back      spastic muscle.  We described how to use these patches before she left      today.  6.  Will taper off valium and begin her on a trial of Soma 350 mg t.i.d.      scheduled for one week and then p.r.n. thereafter.  7.  Continue Effexor at 225 mg daily as well as Elavil 100 mg q.h.s.  8.  Will see the patient back in approximately one  month's time.       ZTS/MedQ  D:  05/17/2004 14:40:48  T:  05/17/2004 15:44:20  Job #:  [id number]

## 2010-09-24 NOTE — Assessment & Plan Note (Signed)
Morgan Wilkerson is back regarding her chronic pain.  She has had some aches and pains  in similar areas since I last saw her.  Her daughter is moving in and she  has been helping with that and this has caused some more pain particularly  in her shoulders and wrists.  She reports that her pain is 6 to 7 out of 10.  She uses Methadone 10 mg b.i.d.  She is also taking Lyrica 10/325 three to  four times a day.  She is one Effexor 75 XR three tablets daily.  She  describes the pain as sharp and burning at times.  She has had some  intermittent swelling in the left periclavicle area as well as the back.  She states that when she uses her Flexeril, this helps these areas of  spasm.  The pain interferes with general activity, relationship with  others, and enjoyment of life on a significant level.   REVIEW OF SYSTEMS:  The patient reports bladder control problems, weakness,  numbness, trouble walking, spasm, depression, anxiety, nausea, labile  sugars, night sweats, painful urination, and limb swelling.   SOCIAL HISTORY:  Pertinent issues are mentioned above.  She was declined  disability once again.   PHYSICAL EXAMINATION:  VITAL SIGNS:  Blood pressure 139/69, pulse 102,  respiratory rate 16, she is saturating 99% on room air.  GENERAL:  The patient is generally flat, less cheerful, and more pleasant  today.  She has pain over the left shoulder which was notable at the left  short head biceps tendon.  She also had pain on the right long head biceps  tendon which were both tender with provocation today.  Posture was fair,  though, she sits with her shoulders curved position.  Her head is generally  forward.  Reflexes are 2+.  Motor function is generally 5/5 except for pain  in the patient proximally at the shoulders.  Sensation is intact.  I  appreciate no frank swelling or spasm today.  Cognitively she is intact.  Cranial nerve examination is unremarkable.   ASSESSMENT:  1. Fibromyalgia syndrome.  2. Bilateral bicipital tendinitis and rotator cuff syndrome.  3. Chronic headaches.  4. Inguinal neuropathic pain.  5. Obesity.  6. Depression.  7. Insomnia.   PLAN:  1. Injected after informed consent the left short head biceps tendon and      right long head biceps tendon with 3 mL of 1% lidocaine and 40 mg of      Kenalog.  The patient tolerated it well.  2. Refilled Methadone and Norco 10/325 #60 and #120 respectively.  3. Encouraged Flexeril at night for sleep as well as her Elavil.  4. Encouraged ongoing activity and exercise.  5. I will see her back in approximately 3 months' time.  She will see the      nurse clinic in one months' time.      Ranelle Oyster, M.D.  Electronically Signed     ZTS/MedQ  D:  02/10/2006 16:19:31  T:  02/12/2006 23:01:31  Job #:  [id number]

## 2010-09-24 NOTE — Assessment & Plan Note (Signed)
Morgan Wilkerson is back regarding fibromyalgia and pain. She ran out of her Effexor  about a month ago and states that she can no longer afford it. She has run  into some conflicts regarding her drug assistance program. She has not been  able to make it over to [employer]. Her pain is 7 out of 8 and she has a  lot of anxiety associated with it.  Her left shoulder remains painful as  well with swelling noted around the clavicle. The pain is described as  sharp, burning, stabbing, aching, tingling and constant. The pain interferes  with her general activity, relations with others and enjoyment of life on a  severe level. The pain usually is worse with any type of activity.   REVIEW OF SYSTEMS:  Is notable for bladder control problems, weakness,  tremor, numbness, dizziness, confusion, depression, anxiety and weight gain.  Other pertinent positives are listed above and fully reviewed in the health  and history section.   SOCIAL HISTORY:  The patient is married and is not working.   PHYSICAL EXAMINATION:  VITAL SIGNS:  Blood pressure is 124/68, pulse is 82,  respiratory rate 16. She is saturating 99% on room air.  GENERAL:  The patient is very flat and tearful at times today.  She  Complains of pain over the shoulder and chest. She remains overweight.  Shoulder motion is limited the left side and stable on the right.  Posture  is fair.  Reflexes are 2+.  Sensation is stable. No frank spasm or swelling  was appreciated today in the chest or shoulder area.   ASSESSMENT:  1.  Fibromyalgia syndrome.  2.  Left bicipital tendinitis and rotator cuff syndrome.  3.  Old right rotator cuff disorder.  4.  Chronic headaches.  5.  Inguinal neuropathic pain.  6.  Obesity.  7.  Depression.  8.  Insomnia.   PLAN:  1.  I gave patient samples of Effexor XR to tide her over until she is able      to seek drug assistance. We may need to go to the regular release      Effexor if she is unable to acquire  assistance.  2.  Continue with methadone and Vicodin for pain control as well as Flexeril      for spasms. She will use Ambien for sleep at night. Encouraged      psychological follow up and exercise.  3.  I will see her at her scheduled appointment in September.      Ranelle Oyster, M.D.  Electronically Signed     ZTS/MedQ  D:  11/18/2005 15:43:48  T:  11/18/2005 19:18:49  Job #:  [id number]

## 2010-09-24 NOTE — Assessment & Plan Note (Signed)
MEDICAL RECORD NUMBER:  [id number]   HISTORY OF PRESENT ILLNESS:  Morgan Wilkerson is back regarding her fibromyalgia and  chronic pain.  She generally has been doing a bit better except for in the  morning when her pain seems to be worse in her lower back and legs.  She  will wake up with 8-9/10 pain.  She had mentioned some suicidal thoughts,  but these only seemed to be in the mornings when she does not feel like  going through the pain anymore, but when she uses her medications, the  morning pain clinically subsides.  She uses Tramadol and hydrocodone for  breakthrough pain.  She is on Amitriptyline at bedtime at 25 mg.  The DHEA  has helped somewhat with her endurance and exercise tolerance.  Headaches  have been stable to improved.  She had good results with the sphincter  injection last time as well.   The patient states pain is worse with walking, bending, sitting and standing  and improves with rest, heat and medications.  She can walk about 45 minutes  without stopping.  She has tried to get some regular exercise.   REVIEW OF SYSTEMS:  The patient reports some head and numbness in the left  hand, as well as, trouble walking, spasms, tingling, bladder control  problems, depression, confusion, suicidal thoughts as mentioned above which  are very fleeting if at all.  She does report occasional nausea and dysuria.   SOCIAL HISTORY:  The family remains very supportive of her.   PHYSICAL EXAMINATION:  VITAL SIGNS:  Blood pressure 161/91, pulse 109,  respiratory rate 16, saturating 100% on room air.  GENERAL APPEARANCE:  The patient is pleasant and in no acute distress.  She  remains obese.  NEUROLOGICAL:  Affect is fairly bright.  Gait is stable but slightly  shuffling.  Coordination is fair.  Reflexes are 1+.  Sensation is normal.  Motor function is near 5/5.  The thumb is improved on the right.  Just has  typical tender areas today.  Skin was generally intact.  She is within  normal  limits.  HEART:  Regular rate and rhythm.  LUNGS:  Clear.  ABDOMEN:  Soft, nontender.   ASSESSMENT:  1.  Fibromyalgia with myofascial features.  2.  Right wrist pain which is improved.  3.  Headaches.  4.  Inguinal neuropathic pain.  5.  Obesity.  6.  Depression.  7.  Insomnia.   PLAN:  1.  We will begin the patient on long-acting opiates, i.e., methadone 10 mg      b.i.d., to see if we can cover her pain better throughout the night and      prevent her from having difficulties in the morning when she arises.      When she has difficulty tolerating this medication, she may decrease to      5 mg b.i.d.  We discussed the side effects and problems with the      medication today.  2.  Continue DHEA at 25 mg b.i.d.  She has had some mild hair growth with      this, but nothing alarming at this time.  3.  The patient will use Ultram 50 mg q.6h. for breakthrough pain.  I asked      her to temporarily stop using her hydrocodone and observe tolerance of      the methadone.  4.  May use Elavil at bedtime for sleep.  5.  Will see the patient  back in about a months time for further titration      of her program.       ZTS/MedQ  D:  12/08/2004 11:01:03  T:  12/08/2004 12:32:24  Job #:  [id number]

## 2010-09-24 NOTE — Assessment & Plan Note (Signed)
Morgan Wilkerson is back regarding her fibromyalgia syndrome and left shoulder pain.  She came off the Mobic as it was causing her shortness of breath and  wheezing and states that generally her symptoms are resolved off of this.  She reports her pain at a 5 to 6 out of 10.  Notes more pain since she has  been active at [facility] with the [employer] therapy.  She  describes the pain as a generalized aching, sharp, stabbing, burning pain.  The pain interferes with general activity, relations with others, enjoyment  of life on a severe level.  The pain is most prominent in the evening and  night hours.  Sleep is still poor.  She did not take the [facility] as  prescribed.  She uses Elavil at bedtime and now has changed Effexor to the  morning.  The patient has seen Dr. Leonides Cave for psychological counseling and  seems a bit unsure about this as far as benefit at this point.  She has  rescheduled followup with Dr. Leonides Cave.  The patient does note some swelling  in the shoulder, after coming off the Mobic.  She does use occasional ice  and heat with results.  She reports some spasm on her right side.   REVIEW OF SYSTEMS:  Positive for bowel and bladder control issues, tremor,  tingling, dizziness, confusion, depression, skin rash, fever, weight gain,  limb swelling, wheezing, shortness of breath.  Other pertinent positives  listed above.  A full review is in the health and history section.   SOCIAL HISTORY:  Without change.   PHYSICAL EXAMINATION:  VITAL SIGNS:  Blood pressure is 142/66, pulse is 81,  respiratory rate 16.  She is sating 97% on room air.  GENERAL:  The patient is pleasant, no acute distress today.  She appears a  bit more lively than I have seen her on other visits.  MUSCULOSKELETAL:  The patient continues to have diffuse pain.  Bicipital  tendons are tender but no different from last visit.  Shoulder motion is  within normal limits on the right.  The left side is a bit more  inhibited.  Posture was fair, although she tilts her head forward position.  Motor  function is 4+ to 5 out of 5 except for some inhibition at the shoulders due  to pain.  Reflexes are 2+.  Sensation, coordination were fair.  No spasm or  frank swelling was appreciated today on examination.  NEUROLOGIC:  Cranial nerve exam is intact.   ASSESSMENT:  1.  Fibromyalgia syndrome.  2.  Left bicipital tendonitis and rotator cuff syndrome.  3.  Old right rotator cuff disorder.  4.  Chronic headaches.  5.  Inguinal neuropathic pain.  6.  Obesity.  7.  Depression.  8.  Insomnia.   PLAN:  1.  Encourage the patient to begin Lunesta 2 mg nightly.  2.  Also, will try 10 mg of Flexeril at nighttime to help the spasms and      sleep.  She may take an additional 5 mg during the day as needed.  3.  Continue methadone 10 mg b.i.d. and Vicodin 10/660, 1 q.6 h. p.r.n.      #120.  4.  Encourage exercise and persistence as well as appropriate diet to see if      we can control her weight.  5.  Some of her sleep problems are a result of her long term night job and I  think it is difficult for her to get back into the pattern after such a      long period of working in the evening and over night hours.  6.  Encouraged follow up with Dr. Leonides Cave for mood and pain management      strategies.  7.  Encouraged ample ice to the left shoulder for swelling.  8.  I will see the patient back in about 3 months' time.  9.  [facility].      Ranelle Oyster, M.D.  Electronically Signed     ZTS/MedQ  D:  10/26/2005 12:56:21  T:  10/26/2005 14:30:20  Job #:  [id number]   cc:   Vivien Presto, Dr.

## 2010-09-24 NOTE — Op Note (Signed)
   NAME:  Morgan Wilkerson, Morgan Wilkerson                          ACCOUNT NO.:  [id number]   MEDICAL RECORD NO.:  [id number]                   PATIENT TYPE:  AMB   LOCATION:  [facility]                                 FACILITY:  [facility]   PHYSICIAN:  Lindaann Slough, M.D.               DATE OF BIRTH:  04-Dec-1955   DATE OF PROCEDURE:  02/22/2002  DATE OF DISCHARGE:                                 OPERATIVE REPORT   PREOPERATIVE DIAGNOSES:  Recurrent urinary tract infections, possible  interstitial cystitis.   POSTOPERATIVE DIAGNOSES:  Interstitial cystitis.   SURGEON:  Lindaann Slough, M.D.   ANESTHESIA:  General.   INDICATIONS FOR PROCEDURE:  The patient is a 55 year old female who has been  complaining of frequency, urgency, suprapubic pain. She was treated with  antibiotics without any improvement. She is scheduled today for cystoscopy  and hydraulic bladder distention.   DESCRIPTION OF PROCEDURE:  Under general anesthesia, the patient was prepped  and draped and placed in the dorsal lithotomy position. A #22 Wappler  cystoscope was inserted in the bladder. The bladder mucosa is reddened.  There is no stone or tumor in the bladder. The ureteral orifices are in  normal position and shape with clear efflux. There is evidence of submucosal  hemorrhage. The bladder was then distended under 80 cm of water. The bladder  capacity is 800 cc. The cystoscope was then removed. The urethra was  infiltrated with 10 cc of 0.5% Marcaine. Then a #16 Robinson catheter was  inserted in the bladder and 400 mg of Pyridium and 15 cc of 0.5% Marcaine  was instilled in the bladder.   The patient tolerated the procedure well and left the OR in satisfactory  condition to the post anesthesia care unit.                                                Lindaann Slough, M.D.    MN/MEDQ  D:  02/22/2002  T:  02/23/2002  Job:  [id number]   cc:   Hal Morales, M.D.

## 2010-09-24 NOTE — Assessment & Plan Note (Signed)
Lyssa is back regarding her fibromyalgia and shoulder pain. She has been  tolerating the Effexor and Topamax as well as the Elavil for sleep. Sleep  generally is improved. Her mood has made significant strides with the  Effexor at 150 mg daily. The left shoulder was helped with the injection but  has had some increased pain over the last month or so. She had a fall at  home and hit her right cheek on the doorknob and has been seen by her local  physician but no obvious fracture or injury was found. She had some pain  related to that. The patient rates her pain from a 5 to 8/10. Pain is most  prominent in the left shoulder more so than the right shoulder as well as  low back, neck, ankles, and the inguinal region on the right and left.   The patient denies any nausea, vomiting, diarrhea, chest pain, shortness of  breath, thoughts of depression, bowel or bladder incontinence. Sleep as I  stated before is a bit improved.   PHYSICAL EXAMINATION:  The patient is pleasant in no acute distress. Blood  pressure is 132/84, pulse 104. She is saturating 97% on room air. The  patient had improved range of motion of the neck today. There are some  slight impingement signs on the left side. She is much improved still,  though, compared to last time. Strength was 5/5 in the upper extremities and  the lower extremities. Lumbar range of motion was approximately 80 degrees  with forward flexion, 20 degrees with extension, rotation, and lateral  bending over 25+ degrees. There was some tenderness noted in the lumbar  spine and parascapular areas but to a lesser degree than I have seen in the  past.   ASSESSMENT:  1. Fibromyalgia.  2. Degenerative shoulder disease, left and right sides, status post left     shoulder injections.  3. Inguinal neuropathic pain.  4. Obesity.  5. Depression.   PLAN:  1. We will continue her Elavil at 100 mg q.h.s. for sleep and pain.  2. Will continue Topamax at 200 mg a  day.  3. She will use Vicodin for breakthrough pain one q.8h. p.r.n. 5/500. I     encouraged her to try to titrate this done as tolerated.  4. Will continue Effexor at 150 mg q.d. extended release. The patient has     done very well with this and has the best mood that I have seen in some     time today.  5. Discussed possibly discussing with vocational re[place]     return to work of some type at a sedentary level.  6. Will see her back in three months' time.      Ranelle Oyster, M.D.   ZTS/MedQ  D:  05/16/2003 11:34:01  T:  05/16/2003 13:21:54  Job #:  [id number]   cc:   Gladstone Pih, Ph.D.  [address] [place]  [place] [address]

## 2010-09-24 NOTE — Assessment & Plan Note (Signed)
Morgan Wilkerson is back regarding her pain syndrome.  Things have been about  stable regarding her pain.  She is rating it a 6-7 out of 10, and it is  predominantly in the shoulders and low back, and somewhat in the groin  and legs.  Still reports some swelling on the left clavicle area.  States that pain interferes with general activity, relations with  others, and enjoyment of life on a moderate level.  Sleep is fair.  She  is still limited with her exercise.  She does stretch daily, but does  not do much in the way of walking.  She maxes out, really, at about 3  minutes on the treadmill.   REVIEW OF SYSTEMS:  Not notable for any new items.  Full review is in  the health and history section.   SOCIAL HISTORY:  This has not changed.   PHYSICAL EXAMINATION:  Blood pressure 114/54, pulse is 88, respiratory  rate 16, she is satting 97% on room air.  Patient is pleasant, no acute distress.  Patient is alert and oriented  x3.  Affect is generally bright and appropriate.  She tends to walk with a limp, favoring the left side.  She has  tenderness along the right wrist as well as left clavicular and  trapezius area.  Both shoulders are notable for rotator cuff signs and  bicipital tenderness.  Strength is generally 5/5, except for pain  inhibition and weakness at the shoulders and proximal arms.  Reflexes  are 2+.  Sensation is intact.  Cranial nerve is normal.  I felt the patient was generally bright and in good spirits today.  HEART:  Regular.  CHEST:  Clear.  ABDOMEN:  Soft, nontender.   ASSESSMENT:  1. Fibromyalgia syndrome.  2. Bicipital tendinitis and rotator cuff syndrome.  3. Chronic headaches.  4. Inguinal neuropathy pain.  5. Obesity.  6. Depression.  7. Insomnia.  8. Osteoarthritis of the knees.  9. Wrist pain on the right consistent with De Quervain's      tenosynovitis.   PLAN:  1. Increase methadone to 10 mg t.i.d. to help sleep and morning pain.  2. Continue Norco 10/325  one q.6 h. p.r.n. for breakthrough symptoms.  3. We discussed an exercise program where she slowly increased her      walking time.  She needs to focus on this in a long term sense.  I      am not sure if she is ready to commit to this.  4. Begin Effexor for mood control and pain.  5. Flexeril and Elavil at bedtime for sleep and spasm.      Ranelle Oyster, M.D.  Electronically Signed     ZTS/MedQ  D:  06/30/2006 15:16:41  T:  06/30/2006 16:57:44  Job #:  [id number]

## 2010-09-24 NOTE — H&P (Signed)
[facility]  Patient:    Morgan Wilkerson, Morgan Wilkerson Visit Number: [id number] MRN: [id number]          Service Type: DSU Location: [id number] Attending Physician:  Shaune Spittle Dictated by:   Henreitta Leber, P.A. Admit Date:  09/11/2001                           History and Physical  DATE OF BIRTH:  01-01-1956.  HISTORY OF PRESENT ILLNESS:  The patient is a 55 year old married African-American female para 1-0-0-1 with a longstanding history of heavy irregular menstrual bleeding, dysmenorrhea, and chronic pelvic pain which is accompanied by (complicated by a history of chronic abdominal wall pain secondary to injury) who presents with hysterectomy.  Off and on for the past 10 years the patient bled heavy "3 of her 7 menstrual cycle" changing an overnight pad 4 times per day.  She would then had a recurrence of this same pattern approximately 16 days later with some flows lasting as long as 17 days.  At one point the patient was placed on monthly progesterone therapy, however this did not curtail her symptoms.  The patient had a normal PSH three years ago which was within normal limits and a pelvic ultrasound which showed only a single 2 x 2 x 1.6 x 2.1 cm fibroid along the mid uterine segment.  In 2001, the patient received a 6 month course of Lupron for pelvic pain which good relief and cessation of her menses.  This reprieve lasted several months after Lupron was discontinued but once menses resumed the patient was again with irregular menstrual periods characterized by a large volume.  After reviewing the medical surgical options available for further management of her symptoms (to include a sonohistogram to see if patient would be a candidate for ablative therapy), the patient has consented to a total vaginal hysterectomy as a definitive measure to address her problem.  PAST MEDICAL HISTORY/ OB HISTORY:  Gravida 1, para 1-0-0-1.  GYN  HISTORY:  Menarche 55 years old.  Menses are as per history of present illness.  The patient uses bilateral tubal ligation as her method of contraception.  She denies any history of sexually transmitted diseases.  She does have a history of cryotherapy for abnormal Pap smears.  Her last normal Pap smear was January 2003.  Her last mammogram was 1998 which showed a benign appearing right breast cyst.  MEDICAL HISTORY:  Chronic abdominal pain secondary to injury (nerve entrapment), 1997; hemorrhoids; anemia; fibroids; hypertension; incontinence; and right breast cyst.  SURGICAL HISTORY:  The patient does not accept blood; she had a colonoscopy 2000 within normal limits; cryotherapy 2000 - within normal limits; laparoscopy 2001 for pelvic pain - no endometriosis was found by biopsy, however there was positive visual stigmata (peritoneal window); hysteroscopy D&C 2001, within normal limits; and bilateral tubal ligation.  FAMILY HISTORY:  Positive for hypertension, diabetes, stroke, and lung cancer.  SOCIAL HISTORY:  The patient is married and she works as a Clinical research associate.  HABITS:  The patient does not use tobacco.  CURRENT MEDICATIONS: 1. Tizanidine 2 mg daily (for pain). 2. Fluoxetine 20 mg 3 tablets daily. 3. Hydrochlorothiazide 25 mg 1 tablet daily. 4. amitriptyline 50 mg 1/2 to 1 tablet at bedtime. 5. Cyclobenzaprine 10 mg as needed. 6. Vioxx 50 mg daily. 7. Zonegran 100 mg 4 tablets at bedtime. 8. Ibuprofen 800 mg as needed.  ALLERGIES:  No known drug allergies.  REVIEW OF SYSTEMS:  Are as per medical history.  Otherwise negative.  PHYSICAL EXAMINATION:  VITAL SIGNS:  Blood pressure 120/90, weight 206-1/2 pounds, height 5 feet 4-1/2 inches tall.  NECK:  Supple without masses.  There is no thyromegaly.  LUNGS:  Clear to auscultation.  There is no wheezes, rales or rhonchi.  HEART:  Regular rate and rhythm, there is no murmur.  ABDOMEN:  Bowel sounds are present.   It is soft.  It is diffusely tender particularly in the lower quadrants with guarding.  There is no rebound or organomegaly  BACK:  No CVA tenderness.  PELVIC:  EG/BUS is within normal limits.  VAGINA:  Rugose.  Cervix is nontender without lesions.  Uterus appears normal size and shape and consistency though difficult to assess secondary to patients habitus and exquisite abdominal wall tenderness.  Right adnexa without any tenderness or masses.  Left adnexa tender, no palpable masses. Rectovaginal confirms.  IMPRESSION: 1. Irregular uterine bleeding. 2. Chronic abdominal wall pain.  DISPOSITION:  A review of medical and surgical options were presented to the patient as a means of managing her symptoms.   The patient has chosen hysterectomy and understands the implications for her procedure along with the risk associated with it to include reactions to anesthesia, damage to adjacent organs, bleeding and infections.  She has consented to undergo a total vaginal hysterectomy at [facility] on Sep 11, 2001, at 9 a.m.Dictated by:   Henreitta Leber, P.A. Attending Physician:  Shaune Spittle DD:    /  / TD:  09/10/01 Job: [id number]

## 2010-09-24 NOTE — Assessment & Plan Note (Signed)
Steve is back regarding her fibromyalgia, myofascial pain, and degenerative  shoulder pain. She had been doing fairly well with the combination of  Effexor, Topamax, and Elavil. She ran out of the Topamax about a month ago  and noticed a distinct difference off the medication. She notes more pain  and problems with sleep. Her pain rates on average a 7 to 10/10. She has  pain in both shoulders as well as the neck, back, and to a lesser extent  into the inguinal regions. She is not pursuing a lot of activity. She is out  of work still. She is using hydrocodone 5/500 for breakthrough pain.   REVIEW OF SYSTEMS:  The patient denies any chest pain, shortness of breath,  wheezing, or coughing. She does report some weakness, blurred vision,  anxiety, depression, problems with sleep, and with headaches. She denies  nausea, vomiting, reflux, diarrhea. Does have some painful urination and  urinary frequency as well as abdominal pain and poor appetite.   PHYSICAL EXAMINATION:  Blood pressure is 142/81, pulse 109, respiratory rate  26. She is saturating 98% on room air. The patient walks with a normal gait  today. She is alert. Affect is flat. Appearance is generally normal. The  patient sat for me today and had forward head posture. She had some  generalized tightness in the latissimus dorsi on the left side. Also, a  trigger point on the mid latissimus dorsi at approximately T8. Scapulae were  essentially symmetrical. They are slightly protracted. They are also rotated  anteriorly. Right trapezius was taut and painful with pressure in the mid  anterior portion. Also noticed the top band of the right levator scapular  reproduced pain. Cervical range of motion was fair. She had some tightness  in the left side but did not have pain provocation with palpation today.  Left and right shoulders were still positive for impingement and painful  with manipulation today. Strength within 5/5 range.   ASSESSMENT:  1. Fibromyalgia.  2. Myofascial pain.  3. Degenerative shoulder disease, right greater than left.  4. Probable inguinal neuropathic pain.  5. Obesity.  6. Depression.   PLAN:  1. After informed consent, we injected trigger points in the right levator     scapulae, right trapezius, and left latissimus dorsi muscle today with 2     cc each of 1% lidocaine.  2. Will continue with her Elavil at 100 mg dose for sleep. We will make     attempts to through drug assistance to provide the Topamax at 200 mg     q.h.s. We gave her some Gabitril that she may use a 2 to 4 mg dose at     bedtime to help her through a bit until she receives her Topamax.  3. She will maintain on a 150 mg dose of Effexor.  4. She will use Vicodin 5/500 for breakthrough pain q.8h. p.r.n.  5. I would like to work on vocational issues over the summer.  6. See the patient back in three months' time.      Ranelle Oyster, M.D.   ZTS/MedQ  D:  09/15/2003 14:21:16  T:  09/15/2003 15:54:59  Job #:  [id number]

## 2010-10-06 ENCOUNTER — Encounter: Payer: Self-pay | Admitting: Internal Medicine

## 2010-10-07 ENCOUNTER — Encounter: Payer: Medicare Other | Attending: Physical Medicine & Rehabilitation | Admitting: Neurosurgery

## 2010-10-07 DIAGNOSIS — M1990 Unspecified osteoarthritis, unspecified site: Secondary | ICD-10-CM | POA: Insufficient documentation

## 2010-10-07 DIAGNOSIS — M79609 Pain in unspecified limb: Secondary | ICD-10-CM | POA: Insufficient documentation

## 2010-10-07 DIAGNOSIS — M25519 Pain in unspecified shoulder: Secondary | ICD-10-CM | POA: Insufficient documentation

## 2010-10-07 DIAGNOSIS — E669 Obesity, unspecified: Secondary | ICD-10-CM | POA: Insufficient documentation

## 2010-10-07 DIAGNOSIS — M24176 Other articular cartilage disorders, unspecified foot: Secondary | ICD-10-CM | POA: Insufficient documentation

## 2010-10-07 DIAGNOSIS — M25529 Pain in unspecified elbow: Secondary | ICD-10-CM

## 2010-10-07 DIAGNOSIS — M249 Joint derangement, unspecified: Secondary | ICD-10-CM | POA: Insufficient documentation

## 2010-10-07 DIAGNOSIS — IMO0001 Reserved for inherently not codable concepts without codable children: Secondary | ICD-10-CM | POA: Insufficient documentation

## 2010-10-08 NOTE — Assessment & Plan Note (Signed)
Morgan Wilkerson follows up today regarding her chronic pain syndrome.  She states her feet still hurts.  She does have some ligament tears in the left foot and she is following with Dr. Lestine Box of [facility], but she is putting off surgery as long as possible.  She rates her pain today at about a 8-9, sharp burning usually constant. She has some paresthesia with only pain.  General activity level is about 7.  Pain is same 24 hours a day.  Sleep patterns are poor.  She has problems with pain with all activities.  Heat, medication, TENS unit, and injections tend to help.  She walks without assistance.  She can walk about 20-30 minutes at a time.  She can climb steps and drive.  REVIEW OF SYSTEMS:  Notable for those difficulties as well as some weakness and paresthesias in the lower extremities, confusion, depression, anxiety.  She does have some help with ADLs.  FAMILY HISTORY:  Unchanged.  SOCIAL HISTORY:  She is married according to the last note.  FAMILY HISTORY:  Unchanged.  PHYSICAL EXAMINATION:  VITAL SIGNS:  Blood pressure 147/65, pulse 92, respirations 20, O2 sats 96 on room air. GENERAL:  She is alert and oriented x3.  She presented obese.  She is somewhat depressed. MUSCULOSKELETAL:  Motor strength appears to be intact.  Lower extremities, gives way to pain.  Sensation is diminished bilaterally in the lower extremities, but no acute changes.  Her pill counts were 0 today, last fill August 14, 2010.  No obvious signs of aberrant behavior, but she has to complete UDS today.  ASSESSMENT: 1. Fibromyalgia syndrome. 2. Degenerative joint disease. 3. Shoulder pain. 4. Myofascial shoulder girdle pain. 5. Left foot with ligament tears __________ tibiofibular, follows with     Dr. Lestine Box.  PLAN: 1. We are going to send her to [facility] for new AFO of her left foot     until she can get back to Dr. Lestine Box. 2. After her UDS is completed, we will refill her hydrocodone  10/325     one p.o. q.6 hours p.r.n. pain 120 with no refill, methadone 10 mg     one p.o. b.i.d. 60 with no refill. 3. She will follow up with Dr. Riley Kill in 2 months and probable nurse     visit in 1 month.  Her questions were encouraged and answered.     Morgan Luthi L. Blima Dessert Electronically Signed    RLW/MedQ D:  10/07/2010 13:45:22  T:  10/08/2010 03:29:17  Job #:  [id number]

## 2010-10-11 ENCOUNTER — Encounter: Payer: Medicare Other | Admitting: Internal Medicine

## 2010-11-04 ENCOUNTER — Ambulatory Visit (INDEPENDENT_AMBULATORY_CARE_PROVIDER_SITE_OTHER): Payer: Medicare Other | Admitting: Internal Medicine

## 2010-11-04 ENCOUNTER — Encounter: Payer: Self-pay | Admitting: Internal Medicine

## 2010-11-04 DIAGNOSIS — E119 Type 2 diabetes mellitus without complications: Secondary | ICD-10-CM

## 2010-11-04 DIAGNOSIS — E785 Hyperlipidemia, unspecified: Secondary | ICD-10-CM

## 2010-11-04 DIAGNOSIS — IMO0002 Reserved for concepts with insufficient information to code with codable children: Secondary | ICD-10-CM

## 2010-11-04 DIAGNOSIS — I10 Essential (primary) hypertension: Secondary | ICD-10-CM

## 2010-11-04 DIAGNOSIS — R5382 Chronic fatigue, unspecified: Secondary | ICD-10-CM

## 2010-11-04 DIAGNOSIS — M797 Fibromyalgia: Secondary | ICD-10-CM

## 2010-11-04 LAB — COMPREHENSIVE METABOLIC PANEL
AST: 32 U/L (ref 0–37)
BUN: 6 mg/dL (ref 6–23)
CO2: 28 mEq/L (ref 19–32)
Calcium: 9.2 mg/dL (ref 8.4–10.5)
Chloride: 101 mEq/L (ref 96–112)
Creat: 0.79 mg/dL (ref 0.50–1.10)
Total Bilirubin: 0.3 mg/dL (ref 0.3–1.2)

## 2010-11-04 LAB — LIPID PANEL
HDL: 45 mg/dL (ref >39)
Triglycerides: 139 mg/dL (ref <150)

## 2010-11-04 LAB — CORTISOL: Cortisol, Plasma: 0.3 ug/dL

## 2010-11-04 MED ORDER — GLIPIZIDE 5 MG PO TABS: 10.0000 mg | ORAL_TABLET | Freq: Every day | ORAL | Status: DC

## 2010-11-04 MED ORDER — METFORMIN HCL 1000 MG PO TABS: 1000.0000 mg | ORAL_TABLET | Freq: Two times a day (BID) | ORAL | Status: DC

## 2010-11-04 NOTE — Assessment & Plan Note (Addendum)
Last A1c was 7.1 in May 2012.  She does not have a meter with her today for review. She denies any hypoglycemic events or hypoglycemic symptoms. She is tolerating her metformin and glipizide well and requests refills of his medications. We will check a compressive metabolic panel as well as urine microalbumin creatinine today. Will not make any adjustments to her antihypoglycemic regimen at this time.  Will refer her for her annual diabetic eye exam today

## 2010-11-04 NOTE — Progress Notes (Signed)
  Subjective:    Patient ID: Morgan Wilkerson, female    DOB: 12-20-1955, 55 y.o.   MRN: [id number]  HPI Please see the A&P for the status of the pt's chronic medical problems.    Review of Systems  Constitutional: Negative for fever, chills, diaphoresis, activity change, appetite change and fatigue.  HENT: Negative for hearing loss, facial swelling and neck stiffness.   Eyes: Negative for photophobia, pain and visual disturbance.  Respiratory: Negative for shortness of breath and wheezing.   Cardiovascular: Negative for chest pain.  Gastrointestinal: Negative for nausea, vomiting, abdominal pain, diarrhea, constipation and blood in stool.  Genitourinary: Negative for dysuria and hematuria.  Musculoskeletal: Positive for myalgias, back pain and arthralgias. Negative for joint swelling.  Skin: Negative for rash.  Neurological: Negative for dizziness, seizures, syncope, light-headedness and numbness.       Objective:   Physical Exam    VItal signs reviewed and stable.  BP elevated above goal today. GEN: No apparent distress.  Alert and oriented x 3.  Pleasant, conversant, and cooperative to exam. HEENT: head is autraumatic and normocephalic.  Neck is supple without palpable masses or lymphadenopathy.  No JVD or carotid bruits.  Vision intact.  EOMI.  PERRLA.  Sclerae anicteric.  Conjunctivae without pallor or injection. Mucous membranes are moist.  Oropharynx is without erythema, exudates, or other abnormal lesions. RESP:  Lungs are clear to ascultation bilaterally with good air movement.  No wheezes, ronchi, or rubs. CARDIOVASCULAR: regular rate, normal rhythm.  Clear S1, S2, no murmurs, gallops, or rubs. ABDOMEN: soft, non-tender, non-distended.  Bowels sounds present in all quadrants and normoactive.  No palpable masses. EXT: warm and dry.  Peripheral pulses equal, intact, and +2 globally.  No clubbing or cyanosis.  Trace edema in bilateral lower extremities. SKIN: warm and dry with  normal turgor.  No rashes or abnormal lesions observed. NEURO: CN II-XII grossly intact.  Muscle strength +5/5 in bilateral upper and lower extremities.  Sensation is grossly intact.  No focal deficit.     Assessment & Plan:

## 2010-11-04 NOTE — Patient Instructions (Signed)
Schedule a followup appointments with Dr. Arvilla Market in one to 2 months, sooner if needed. Your Refills were sent to [facility] pharmacy. Please call us with the information for your new diabetic supplies source he can send in refills for your lancets and test strips Continue to take her medications as directed. We need to taper you off of the prednisone. Take one tablet every other day for one week.   Then take one tablet every third day for one week. Then take one tablet every fifth day for one week. Then stop. We'll call you if any of your lab work is abnormal

## 2010-11-05 LAB — MICROALBUMIN / CREATININE URINE RATIO: Microalb Creat Ratio: 6.5 mg/g (ref 0.0–30.0)

## 2010-11-15 NOTE — Assessment & Plan Note (Signed)
BP elevated above goal today.  Patient states she is compliant with her metoprolol. Unfortunately we do not have significant time to devote to dressing this issue completely. We'll attempt to add an additional medication to her current antihypertensive regimen. We will address this at her next office visit.  We'll check a comp metabolic panel today.

## 2010-11-15 NOTE — Assessment & Plan Note (Addendum)
Pt states she has seen a rheumatologist (she is unable to recall the physician's name or practice) and that "everything was fine and that she was not diagnosed with any rheumatological diseases.  She is still taking prednisone one tablet every day.  I advised her that prednisone is not routinely used for management of chronic pain and that it is very dangerous to continue to do so. I also advised the patient that there do not seem to be any indications for continued steroid use and I will not continue to prescribe this medication. I have asked her to please call us with the information for her rheumatologist so that we may request records.  She states that she is also seeing a specialist for management of her fibromyalgia ; also requested his records today. Will check a random cortisol level today as well as comprehensive metabolic panel.  We discussed the importance of safely tapering from steroids. Reviewed this at length with patient she was able to communicate to me the plans to taper her.  40 minutes with at least 50% face-to-face time spent counseling patient about her chronic medical conditions and coordinate care with other healthcare providers

## 2010-11-15 NOTE — Assessment & Plan Note (Signed)
Patient is tolerating her Zocor well. We'll check a fasting lipid panel as well as liver function tests today

## 2010-12-03 ENCOUNTER — Ambulatory Visit: Payer: Medicare Other | Admitting: Physical Medicine & Rehabilitation

## 2010-12-07 ENCOUNTER — Other Ambulatory Visit: Payer: Self-pay | Admitting: Internal Medicine

## 2010-12-07 DIAGNOSIS — E119 Type 2 diabetes mellitus without complications: Secondary | ICD-10-CM

## 2010-12-07 DIAGNOSIS — R110 Nausea: Secondary | ICD-10-CM

## 2010-12-09 ENCOUNTER — Encounter: Payer: Self-pay | Admitting: Internal Medicine

## 2010-12-09 NOTE — Telephone Encounter (Signed)
I'm not sure if she is still taking this medication.  At her last OV, she was not complaining of nausea/vomiting or symptoms of diabetic gastroparesis.  Is she experiencing any symptoms that prompted this refill request?  If, so   will send in a refill but if not, she doesn't need to take qid reglan.  Thanks!  BTW, I am away at [facility] this month but will try to check my inbasket at least once a day.

## 2010-12-09 NOTE — Progress Notes (Signed)
This encounter was created in error - please disregard.

## 2010-12-22 ENCOUNTER — Encounter: Payer: Medicare Other | Admitting: Physical Medicine & Rehabilitation

## 2011-01-09 ENCOUNTER — Other Ambulatory Visit: Payer: Self-pay | Admitting: Internal Medicine

## 2011-01-24 ENCOUNTER — Encounter: Payer: Medicare Other | Attending: Physical Medicine & Rehabilitation | Admitting: Physical Medicine & Rehabilitation

## 2011-01-24 DIAGNOSIS — IMO0001 Reserved for inherently not codable concepts without codable children: Secondary | ICD-10-CM | POA: Insufficient documentation

## 2011-01-24 DIAGNOSIS — F341 Dysthymic disorder: Secondary | ICD-10-CM

## 2011-01-24 DIAGNOSIS — R51 Headache: Secondary | ICD-10-CM | POA: Insufficient documentation

## 2011-01-24 DIAGNOSIS — J029 Acute pharyngitis, unspecified: Secondary | ICD-10-CM | POA: Insufficient documentation

## 2011-01-24 DIAGNOSIS — M7520 Bicipital tendinitis, unspecified shoulder: Secondary | ICD-10-CM

## 2011-01-24 DIAGNOSIS — X58XXXA Exposure to other specified factors, initial encounter: Secondary | ICD-10-CM | POA: Insufficient documentation

## 2011-01-24 DIAGNOSIS — M19019 Primary osteoarthritis, unspecified shoulder: Secondary | ICD-10-CM | POA: Insufficient documentation

## 2011-01-24 DIAGNOSIS — S93699A Other sprain of unspecified foot, initial encounter: Secondary | ICD-10-CM | POA: Insufficient documentation

## 2011-01-24 DIAGNOSIS — R6883 Chills (without fever): Secondary | ICD-10-CM | POA: Insufficient documentation

## 2011-01-24 DIAGNOSIS — M7530 Calcific tendinitis of unspecified shoulder: Secondary | ICD-10-CM

## 2011-01-24 DIAGNOSIS — M316 Other giant cell arteritis: Secondary | ICD-10-CM | POA: Insufficient documentation

## 2011-01-24 DIAGNOSIS — M25519 Pain in unspecified shoulder: Secondary | ICD-10-CM | POA: Insufficient documentation

## 2011-01-24 NOTE — Assessment & Plan Note (Signed)
HISTORY:  Morgan Wilkerson is back regarding her chronic pain.  She states over the last couple of weeks she has had more aches and pains.  Did note to me that her primary physician has been weaning off her prednisone.  She has been ran out of her Vicodin a few days ago and she does not know for sure how it happened.  She suspects she may have lost some pills.  Rates her pain 8-9/10 today.  She does report some flu-like symptoms over the weekend particularly chills, sore throat.  REVIEW OF SYSTEMS:  Notable for the above.  Does reports confusion, depression, anxiety.  Her headaches actually have been better.  Full 12- point review is in the written health and history section of the chart.  SOCIAL HISTORY:  Unchanged.  FAMILY HISTORY:  Unchanged.  PHYSICAL EXAMINATION:  VITAL SIGNS:  Blood pressure 137/77, pulse 96, respiratory rate 16, and she is satting 96% on room air. GENERAL:  The patient generally pleasant, appears a bit ill.  She is occasionally shivering.  Skin is a bit warm.  She had no obvious cough. Strength is generally intact at 4-5/5 throughout.  Mood was generally pleasant.  I had her walk for me today and she had difficulties with transfer to stand.  She had pain in both knees.  Ankle control seemed to be reasonable actually today.  She was wearing a sandals.  She had bilateral pes planus deformities.  She had some generalized tenderness throughout the arms, hips, shoulders, feet with palpation.  ASSESSMENT: 1. Fibromyalgia syndrome. 2. 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 3. Myofascial shoulder girdle pain. 4. Temporal arteritis. 5. Left anterior talofibular ligament as well as calcaneofibular     ligament injuries.  PLAN: 1. I advised the patient to follow up with her family physician     regarding her prednisone wean.  She may need further     rheumatological workup. 2. We all continue with her hydrocodone and methadone and she has been     doing fairly  well with these.  I encouraged increased activity in     appropriate shoe wear.  We discussed a brace at some point,     although I do not know that she needs this as much as appropriate     athletic tight shoes. 3. Recommended symptomatic treatment of her likely viral illness. 4. My nurse practitioner will see her back here in about a month and I     will see her back in about 6 months.     Ranelle Oyster, M.D. Electronically Signed    ZTS/MedQ D:  01/24/2011 13:02:48  T:  01/24/2011 17:18:32  Job #:  [id number]

## 2011-02-02 LAB — PROTEIN ELECTROPHORESIS, SERUM
Gamma Globulin: 23.8 — ABNORMAL HIGH
M-Spike, %: NOT DETECTED
Total Protein ELP: 7.6

## 2011-02-02 LAB — RETICULOCYTES
RBC.: 3.96
Retic Count, Absolute: 43.6
Retic Ct Pct: 1.1

## 2011-02-02 LAB — HEPATIC FUNCTION PANEL
ALT: 46 — ABNORMAL HIGH
AST: 34
Albumin: 3.5
Alkaline Phosphatase: 82
Total Protein: 7.5

## 2011-02-02 LAB — IRON AND TIBC
Iron: 72
Saturation Ratios: 24
TIBC: 303
UIBC: 231

## 2011-02-02 LAB — FERRITIN: Ferritin: 243 (ref 10–291)

## 2011-02-02 LAB — BASIC METABOLIC PANEL
BUN: 8
Chloride: 100
GFR calc Af Amer: >60
GFR calc non Af Amer: >60
Sodium: 138

## 2011-02-02 LAB — CK TOTAL AND CKMB (NOT AT ARMC): Relative Index: 0.6

## 2011-02-02 LAB — CBC
HCT: 33.3 — ABNORMAL LOW
MCHC: 34.1
Platelets: 196
RDW: 14.6

## 2011-02-02 LAB — TROPONIN I: Troponin I: 0.03

## 2011-02-02 LAB — C-REACTIVE PROTEIN: CRP: 2.5 — ABNORMAL HIGH (ref <0.6)

## 2011-02-02 LAB — VITAMIN B12: Vitamin B-12: 331 (ref 211–911)

## 2011-02-03 LAB — CARDIAC PANEL(CRET KIN+CKTOT+MB+TROPI)
CK, MB: 1
Relative Index: INVALID
Relative Index: INVALID
Relative Index: INVALID
Total CK: 30
Total CK: 48
Troponin I: 0.01
Troponin I: 0.01

## 2011-02-03 LAB — URINALYSIS, ROUTINE W REFLEX MICROSCOPIC
Bilirubin Urine: NEGATIVE
Ketones, ur: NEGATIVE
Leukocytes, UA: NEGATIVE
Nitrite: NEGATIVE
Specific Gravity, Urine: 1.028
Urobilinogen, UA: 1

## 2011-02-03 LAB — DIFFERENTIAL
Basophils Relative: 0
Basophils Relative: 0
Basophils Relative: 0
Eosinophils Absolute: 0
Eosinophils Absolute: 0
Eosinophils Absolute: 0
Eosinophils Relative: 0
Eosinophils Relative: 0
Eosinophils Relative: 0
Lymphocytes Relative: 55 — ABNORMAL HIGH
Lymphs Abs: 3.5
Monocytes Absolute: 0.6
Monocytes Absolute: 0.7
Monocytes Relative: 4
Monocytes Relative: 3
Monocytes Relative: 4
Monocytes Relative: 5
Neutro Abs: 8.2 — ABNORMAL HIGH
Neutrophils Relative %: 70
Neutrophils Relative %: 41 — ABNORMAL LOW
Neutrophils Relative %: 60
Neutrophils Relative %: 70

## 2011-02-03 LAB — LIPASE, BLOOD
Lipase: 32
Lipase: 33

## 2011-02-03 LAB — COMPREHENSIVE METABOLIC PANEL
ALT: 80 — ABNORMAL HIGH
ALT: 59 — ABNORMAL HIGH
AST: 50 — ABNORMAL HIGH
Albumin: 3.1 — ABNORMAL LOW
Alkaline Phosphatase: 83
Alkaline Phosphatase: 56
Alkaline Phosphatase: 65
BUN: 24 — ABNORMAL HIGH
CO2: 28
CO2: 30
Calcium: 9.8
Chloride: 92 — ABNORMAL LOW
Creatinine, Ser: 1.05
GFR calc Af Amer: >60
GFR calc non Af Amer: 55 — ABNORMAL LOW
Glucose, Bld: 547
Glucose, Bld: 337 — ABNORMAL HIGH
Glucose, Bld: 492 — ABNORMAL HIGH
Potassium: 4.8
Potassium: 4.9
Potassium: 5.3 — ABNORMAL HIGH
Sodium: 128 — ABNORMAL LOW
Sodium: 135
Total Bilirubin: 0.9
Total Protein: 7.5
Total Protein: 6

## 2011-02-03 LAB — URINALYSIS, MICROSCOPIC ONLY
Hgb urine dipstick: NEGATIVE
Leukocytes, UA: NEGATIVE
Nitrite: NEGATIVE
Protein, ur: NEGATIVE
Specific Gravity, Urine: 1.039 — ABNORMAL HIGH
Urobilinogen, UA: 0.2

## 2011-02-03 LAB — BASIC METABOLIC PANEL
BUN: 13
BUN: 20
CO2: 29
Calcium: 8.5
Calcium: 8.7
Calcium: 8.8
Calcium: 8.9
Chloride: 102
Creatinine, Ser: 0.62
Creatinine, Ser: 0.72
GFR calc Af Amer: 49 — ABNORMAL LOW
GFR calc Af Amer: >60
GFR calc non Af Amer: 41 — ABNORMAL LOW
GFR calc non Af Amer: >60
GFR calc non Af Amer: >60
Glucose, Bld: 259 — ABNORMAL HIGH
Glucose, Bld: 336 — ABNORMAL HIGH
Glucose, Bld: 98
Potassium: 3.5
Potassium: 4
Sodium: 134 — ABNORMAL LOW
Sodium: 141

## 2011-02-03 LAB — URINE CULTURE: Colony Count: >=100000

## 2011-02-03 LAB — CBC
Hemoglobin: 13.9
Hemoglobin: 11 — ABNORMAL LOW
Hemoglobin: 11.6 — ABNORMAL LOW
MCHC: 33.2
MCHC: 33.2
MCHC: 33.4
MCHC: 33.9
MCV: 86.9
MCV: 87.9
Platelets: 207
Platelets: 213
Platelets: 229
RBC: 4.76
RBC: 3.74 — ABNORMAL LOW
RBC: 4.29
RDW: 14.3
RDW: 14.3
RDW: 14.9
WBC: 11.8 — ABNORMAL HIGH
WBC: 14.7 — ABNORMAL HIGH

## 2011-02-03 LAB — HEMOGLOBIN A1C: Hgb A1c MFr Bld: 11.7 — ABNORMAL HIGH

## 2011-02-03 LAB — PATHOLOGIST SMEAR REVIEW

## 2011-02-03 LAB — HEPATIC FUNCTION PANEL
Alkaline Phosphatase: 56
Indirect Bilirubin: 0.4
Total Bilirubin: 0.5
Total Protein: 6.1

## 2011-02-03 LAB — SEDIMENTATION RATE: Sed Rate: 25 — ABNORMAL HIGH

## 2011-02-11 LAB — GLUCOSE, CAPILLARY: Glucose-Capillary: 118 mg/dL — ABNORMAL HIGH (ref 70–99)

## 2011-02-11 NOTE — Progress Notes (Signed)
Addended by: Dorie Rank E on: 02/11/2011 12:03 PM   Modules accepted: Orders

## 2011-02-21 ENCOUNTER — Encounter: Payer: Medicare Other | Attending: Neurosurgery | Admitting: Neurosurgery

## 2011-02-21 DIAGNOSIS — G8929 Other chronic pain: Secondary | ICD-10-CM | POA: Insufficient documentation

## 2011-02-21 DIAGNOSIS — S8990XA Unspecified injury of unspecified lower leg, initial encounter: Secondary | ICD-10-CM | POA: Insufficient documentation

## 2011-02-21 DIAGNOSIS — IMO0001 Reserved for inherently not codable concepts without codable children: Secondary | ICD-10-CM

## 2011-02-21 DIAGNOSIS — M19019 Primary osteoarthritis, unspecified shoulder: Secondary | ICD-10-CM | POA: Insufficient documentation

## 2011-02-21 DIAGNOSIS — X58XXXA Exposure to other specified factors, initial encounter: Secondary | ICD-10-CM | POA: Insufficient documentation

## 2011-02-21 DIAGNOSIS — S99929A Unspecified injury of unspecified foot, initial encounter: Secondary | ICD-10-CM | POA: Insufficient documentation

## 2011-02-21 DIAGNOSIS — M25529 Pain in unspecified elbow: Secondary | ICD-10-CM

## 2011-02-21 DIAGNOSIS — M25569 Pain in unspecified knee: Secondary | ICD-10-CM

## 2011-02-21 DIAGNOSIS — M25519 Pain in unspecified shoulder: Secondary | ICD-10-CM | POA: Insufficient documentation

## 2011-02-21 DIAGNOSIS — M316 Other giant cell arteritis: Secondary | ICD-10-CM | POA: Insufficient documentation

## 2011-02-22 NOTE — Assessment & Plan Note (Signed)
This is a patient of Dr. Riley Kill who is seen for chronic pain.  She has had pain pretty much throughout her back and arms and legs.  She rates it as a 9 or 10.  She did not indicate her general activity level or when the pain is worse or what helps or worsens it.  Mobility, she walks with assistance.  She does not climb steps or drive.  She cannot really walk any long distance.  Functionally, she needs help with dressing, ADLs, household duties, and shopping.  REVIEW OF SYSTEMS:  Notable for difficulties described above as well as some bladder control issues, numbness, tremors, tingling, trouble walking, spasms, dizziness, confusion, depression, anxiety, fevers, weight gain, nausea, and vomiting.  No suicidal thoughts or apparent aberrant behaviors.  Her last drug screen was consistent, however, her methadone pill count today appears to be correct but she did not bring in her Norco.  She had some Norco for what she said was Norco in an Effexor bottle.  Past medical history, social history, and family history unchanged.  PHYSICAL EXAMINATION:  VITAL SIGNS:  Blood pressure 150/78, pulse 104, respirations 16, and O2 saturations 99 on room air. NEUROLOGIC:  Motor strength is diminished in the upper and lower extremities due to pain.  Sensation is intact.  Constitutionally she is within normal limits.  She is alert and oriented x3.  Seems somewhat depressed due to the pain.  Her gait is abnormal.  She does walk with a crutch and appears somewhat unstable.  ASSESSMENT: 1. Fibromyalgia. 2. Degenerative joint disease in the shoulders. 3. Myofascial shoulder pain. 4. Temporal arteritis. 5. Left anterior talofibular ligament as well as calcaneofibular     ligament injuries.  She was referred by Dr. Riley Kill to Dr. Darrelyn Hillock at [facility] but never followed through him on that.  I have again today asked her to do so to try to get some kind of relief for her foot injury.  I did speak  with Dr. Riley Kill.  PLAN: 1. Refill Effexor 75 mg 1 p.o. t.i.d., 90 with 3 refills. 2. Methadone 10 mg 1 p.o. b.i.d. 60 with no refill. 3. Regarding the Norco, Dr. Riley Kill allowed me to refill that for her     10/325 one p.o. q.6 hours p.r.n., 120 with no refill with a warning     that if she did not bring her bottles back again, he may quit     prescribing for her.  She stated understanding.  She will follow up     here in the clinic in 1 month.     Magdelyn Roebuck L. Blima Dessert Electronically Signed    RLW/MedQ D:  02/21/2011 15:29:39  T:  02/22/2011 06:24:54  Job #:  [id number]

## 2011-03-07 ENCOUNTER — Encounter: Payer: Medicare Other | Admitting: Internal Medicine

## 2011-03-09 ENCOUNTER — Ambulatory Visit (INDEPENDENT_AMBULATORY_CARE_PROVIDER_SITE_OTHER): Payer: Medicare Other | Admitting: Internal Medicine

## 2011-03-09 ENCOUNTER — Encounter: Payer: Self-pay | Admitting: Internal Medicine

## 2011-03-09 DIAGNOSIS — R52 Pain, unspecified: Secondary | ICD-10-CM

## 2011-03-09 DIAGNOSIS — E119 Type 2 diabetes mellitus without complications: Secondary | ICD-10-CM

## 2011-03-09 MED ORDER — KETOROLAC TROMETHAMINE 15 MG/ML IJ SOLN
15.0000 mg | Freq: Once | INTRAMUSCULAR | Status: AC
  Administered 2011-03-09: 15 mg via INTRAVENOUS

## 2011-03-09 NOTE — Progress Notes (Signed)
  Subjective:    Patient ID: Morgan Wilkerson, female    DOB: 06-Jun-1955, 55 y.o.   MRN: [id number]  HPI    Review of Systems     Objective:   Physical Exam        Assessment & Plan:

## 2011-03-09 NOTE — Patient Instructions (Signed)
Please, take all your medications as prescribed. Please, continue with low carbohydrate and low cholesterol diets. Please, call with any questions and follow up in 12 weeks or sooner if needed.

## 2011-03-09 NOTE — Progress Notes (Signed)
Subjective:   [name]   DOB: 1955/07/15 55 y.o.   MRN: [id number]  HPI: [name] is a 55 y.o.  Is here for her "pain medication refills." Patient reports hurting "all over." Sees Dr. Hermelinda Wilkerson at pain management clinic. Has seen him last week with no Change in therapy suggested. Patient denies any fever, chills, stiffness, rash, HA, neck pain; tingling, numbness; normally ambulates with a crutch due to gait instability r/t chronic pain.    Past Medical History  Diagnosis Date  . Panic disorder with agoraphobia   . Hypertension   . History of frostbite     Left breast  . Dysfunctional uterine bleeding     sp hysterectomy  . Right shoulder pain     bone cracked but went into the muscle.  . Left shoulder pain     torn tendon  . Toe fracture 2001/2002    hx of -ring toe  . Temporal arteritis 5/09    Biopsy demonstrated  . Diabetes mellitus 6/09  . History of acute cystitis 2003    Complicated course post vaginal hysterectomy , required abx bladder instillation x 2 by urology  . Gastritis    Current Outpatient Prescriptions  Medication Sig Dispense Refill  . cyclobenzaprine (FLEXERIL) 10 MG tablet Take 10 mg by mouth every 8 (eight) hours as needed.        Marland Kitchen glipiZIDE (GLUCOTROL) 5 MG tablet Take 2 tablets (10 mg total) by mouth daily.  60 tablet  6  . glucose blood test strip 1 each by Other route as needed. Use as instructed       . HYDROcodone-acetaminophen (NORCO) 10-325 MG per tablet Take 1 tablet by mouth 2 (two) times daily as needed.        . insulin NPH (RELION N) 100 UNIT/ML injection Inject into the skin. Inject 42 U subcutaneously in the morning and 26 units at bedtime daily.       . insulin regular (HUMULIN R,NOVOLIN R) 100 UNIT/ML injection Inject into the skin 3 (three) times daily before meals. Inject into the skin of your abdomen using 2:50 correction scale plus 1:15 food scale as instructed 3 times a day 30 mins before meals.       .  Insulin Syringe-Needle U-100 (INSULIN SYRINGE 1CC/30GX1/2") 30G X 1/2" 1 ML MISC by Does not apply route. Use to inject insulin 4x daily.       . Lancets MISC by Does not apply route.        Marland Kitchen LORazepam (ATIVAN) 0.5 MG tablet Take 1 pill for panic attack  21 tablet  0  . metFORMIN (GLUCOPHAGE) 1000 MG tablet Take 1 tablet (1,000 mg total) by mouth 2 (two) times daily with a meal.  60 tablet  11  . methadone (DOLOPHINE) 10 MG tablet Take 10 mg by mouth 2 (two) times daily.        . metoCLOPramide (REGLAN) 5 MG tablet Take 5 mg by mouth 4 (four) times daily. Take 1 tablet by mouth before each meal and before bedtime.       . metoprolol (TOPROL-XL) 50 MG 24 hr tablet Take 50 mg by mouth daily.        Marland Kitchen omeprazole (PRILOSEC OTC) 20 MG tablet Take 20 mg by mouth daily.        . simvastatin (ZOCOR) 20 MG tablet Take 20 mg by mouth at bedtime.        Marland Kitchen venlafaxine (EFFEXOR-XR) 75  MG 24 hr capsule Take 75 mg by mouth daily.         Family History  Problem Relation Age of Onset  . Hypertension Mother   . Aneurysm Maternal Aunt    History   Social History  . Marital Status: Married    Spouse Name: N/A    Number of Children: N/A  . Years of Education: N/A   Social History Main Topics  . Smoking status: Never Smoker   . Smokeless tobacco: None  . Alcohol Use: Yes     Rarely.   . Drug Use: None  . Sexually Active: None   Other Topics Concern  . None   Social History Narrative   Patients phone number is [phone]   Review of Systems: Constitutional: Denies fever, chills, diaphoresis, appetite change and fatigue.  HEENT: Denies photophobia, eye pain, redness, hearing loss, ear pain, congestion, sore throat, rhinorrhea, sneezing, mouth sores, trouble swallowing, neck pain, neck stiffness and tinnitus.   Respiratory: Denies SOB, DOE, cough, chest tightness,  and wheezing.   Cardiovascular: Denies chest pain, palpitations and leg swelling.  Gastrointestinal: Denies nausea, vomiting,  abdominal pain, diarrhea, constipation, blood in stool and abdominal distention.  Genitourinary: Denies dysuria, urgency, frequency, hematuria, flank pain and difficulty urinating.  Musculoskeletal: Endorses myalgias, back pain, joint swelling, arthralgias and gait problem.  Skin: Denies pallor, rash and wound.  Neurological: Denies dizziness, seizures, syncope, weakness, light-headedness, numbness and headaches.  Hematological: Denies adenopathy. Easy bruising, personal or family bleeding history  Psychiatric/Behavioral: Denies suicidal ideation, mood changes, confusion, nervousness, sleep disturbance and agitation  Objective:  Physical Exam: Filed Vitals:   03/09/11 1545  BP: 146/82  Pulse: 105  Temp: 97.8 F (36.6 C)  TempSrc: Oral  Height: 5\' 5"  (1.651 m)  Weight: 207 lb (93.895 kg)   Constitutional: Vital signs reviewed.  Patient is a well-developed and well-nourished  womanin no acute distress and cooperative with exam. Alert and oriented x3.  Ambulates with a crutch. Head: Normocephalic and atraumatic Ear: TM normal bilaterally Mouth: no erythema or exudates, MMM Eyes: PERRL, EOMI, conjunctivae normal, No scleral icterus.  Neck: Supple, Trachea midline normal ROM, No JVD, mass, thyromegaly, or carotid bruit present.  Cardiovascular: RRR, S1 normal, S2 normal, no MRG, pulses symmetric and intact bilaterally Pulmonary/Chest: CTAB, no wheezes, rales, or rhonchi Abdominal: Soft. Non-tender, non-distended, bowel sounds are normal, no masses, organomegaly, or guarding present.  GU: no CVA tenderness Musculoskeletal: No joint deformities, erythema. Pan positive to palpation of her trunk and 4 extremities. Hematology: no cervical, inginal, or axillary adenopathy.  Neurological: A&O x3, Strenght is normal and symmetric bilaterally, cranial nerve II-XII are grossly intact, no focal motor deficit, sensory intact to light touch bilaterally.  Skin: Warm, dry and intact. No rash, cyanosis,  or clubbing.  Psychiatric: Normal mood and affect. speech and behavior is normal. Judgment and thought content normal. Cognition and memory are normal.   Assessment & Plan:   1. Chronic pain syndrome (hx of multiple falls with resulting LBP and joints pain). Suspect psychosomatic component and/or fibromyalgia vs arthritis. - consider adding trazodone vs Gabapentin. -Vicodin is being refilled by Dr. Arlyss Gandy office. Cautioned of sedation and risk of addiction. -Instructed to follow up with her PCP Dr. Arvilla Market: consider ordering ANA, RF if not already done so.   2. DM, type 2; insulin-dependent -Continue with SSI, protocol -Metformin, glipizide -hypoglycemia S:S reviewed. -foot care discussed

## 2011-03-10 ENCOUNTER — Encounter: Payer: Self-pay | Admitting: Internal Medicine

## 2011-03-22 ENCOUNTER — Encounter: Payer: Medicare Other | Attending: Neurosurgery | Admitting: Neurosurgery

## 2011-03-22 DIAGNOSIS — IMO0001 Reserved for inherently not codable concepts without codable children: Secondary | ICD-10-CM | POA: Insufficient documentation

## 2011-03-22 DIAGNOSIS — M25519 Pain in unspecified shoulder: Secondary | ICD-10-CM | POA: Insufficient documentation

## 2011-03-22 DIAGNOSIS — M316 Other giant cell arteritis: Secondary | ICD-10-CM | POA: Insufficient documentation

## 2011-03-22 DIAGNOSIS — M19019 Primary osteoarthritis, unspecified shoulder: Secondary | ICD-10-CM | POA: Insufficient documentation

## 2011-03-22 NOTE — Assessment & Plan Note (Signed)
ACCOUNT:  [id number].  This is a patient of Dr. Hermelinda Medicus seen for fibromyalgia and joint pain. She reports an escalation in her pain.  She describes as being "all over."  Average pain is 10.  It is a sharp, burning, dull, stabbing, tingling aching pain.  She does not indicate her activity level or when the pain is worse, what worsens or helps. She walks with a walker or cane with for assistance. Functionally, she is on disability.  She needs help with all ADLs household duties and shopping.  REVIEW OF SYSTEMS:  Notable for difficulties described above as well as some bladder control issues, weakness, paresthesias, tremors, trouble walking, spasm, dizziness, depression, anxiety.  No suicidal thoughts. Pill count and UDS  correct and consistent.  PAST MEDICAL HISTORY, SOCIAL HISTORY AND FAMILY HISTORY:  Unchanged.  PHYSICAL EXAMINATION:  Blood pressure is 139/68, pulse 85, respirations 18, O2 sats 100 on room air.  Motor strength and sensation are intact. She gives away to pain and has further trouble rising from a seated position.  She complains of pain in every joint in her body. Constitutionally, she is within normal limits.  She is alert and oriented x3.  Somewhat depressed and irritable.  Her gait appears to be somewhat unstable.  ASSESSMENT: 1. Fibromyalgia. 2. Degenerative joint disease of the shoulders. 3. Myofascial shoulder pain. 4. Temporal arteritis.  PLAN: 1. We did not change her medicines, but I did refill methadone 10 mg 1     p.o. b.i.d., 60 with no refill. 2. Norco 10/325 one p.o. q.6 hours p.r.n., 120 with no refill. 3. Refill Flexeril 10 mg 1 p.o. b.i.d. 60 with 3 refills.  Her     questions were encouraged and answered.  She will follow up here in     a month.     Morgan Wilkerson Electronically Signed    RLW/MedQ D:  03/22/2011 14:36:37  T:  03/22/2011 19:56:44  Job #:  [id number]

## 2011-03-26 ENCOUNTER — Other Ambulatory Visit: Payer: Self-pay | Admitting: Internal Medicine

## 2011-04-19 ENCOUNTER — Encounter: Payer: Medicare Other | Admitting: Neurosurgery

## 2011-04-19 ENCOUNTER — Ambulatory Visit: Payer: Medicare Other | Admitting: Neurosurgery

## 2011-04-25 ENCOUNTER — Other Ambulatory Visit: Payer: Self-pay | Admitting: *Deleted

## 2011-04-25 MED ORDER — METOPROLOL SUCCINATE ER 50 MG PO TB24: 50.0000 mg | ORAL_TABLET | Freq: Every day | ORAL | Status: DC

## 2011-05-05 ENCOUNTER — Encounter: Payer: Medicare Other | Attending: Neurosurgery | Admitting: Neurosurgery

## 2011-05-05 DIAGNOSIS — IMO0001 Reserved for inherently not codable concepts without codable children: Secondary | ICD-10-CM | POA: Insufficient documentation

## 2011-05-05 DIAGNOSIS — M25529 Pain in unspecified elbow: Secondary | ICD-10-CM

## 2011-05-05 DIAGNOSIS — M316 Other giant cell arteritis: Secondary | ICD-10-CM | POA: Insufficient documentation

## 2011-05-05 DIAGNOSIS — M19019 Primary osteoarthritis, unspecified shoulder: Secondary | ICD-10-CM | POA: Insufficient documentation

## 2011-05-05 DIAGNOSIS — M25519 Pain in unspecified shoulder: Secondary | ICD-10-CM | POA: Insufficient documentation

## 2011-05-06 NOTE — Assessment & Plan Note (Signed)
This is a patient of Dr. Hermelinda Medicus seen for generalized pain syndrome and fibromyalgia.  She reports no change in her pain.  She rates at 10.  It is a sharp, burning, stabbing, tingling, aching pain.  She did not rate her activity level or what makes the pain worse or helps.  She uses a walker or crutch.  She does not climb steps or drive.  She cannot walk very long distances.  Functionally, she is on disability.  She needs help with all ADLs and household duties.  REVIEW OF SYSTEMS:  Notable for difficulties described above as well as some high blood sugars, nausea, limb swelling, shortness of breath, paresthesias, trouble walking, spasms, dizziness, confusion, depression, anxiety.  No suicidal thoughts or aberrant behaviors.  She did not bring her methadone in for count.  She states she will bring it back.  PAST MEDICAL HISTORY:  Unchanged.  SOCIAL HISTORY:  Unchanged.  FAMILY HISTORY:  Unchanged.  PHYSICAL EXAMINATION:  Blood pressure is 125/79, pulse 106, respirations 12, O2 sats on room air.  Her motor strength sensation somewhat diminished. Constitutionally, she is within normal limits.  She is alert and oriented x3, somewhat depressed.  Her gait is unstable.  ASSESSMENT: 1. Fibromyalgia. 2. Degenerative joint disease of the shoulders. 3. Myofascial shoulder pain. 4. Temporal arteritis.  PLAN: 1. Refill Norco 10/325 one p.o. q.6 hours p.r.n., 120 with no refill 2. Elavil 100 mg 2 at bedtime p.r.n., 60 with 5 refills. 3. Methadone prescription is on the chart, when she brings her bottle     back and we will obtain UDS today.  Otherwise, her questions were     encouraged and answered.  She will see [place] back in a month.     Larry Alcock L. Blima Dessert Electronically Signed    RLW/MedQ D:  05/05/2011 10:11:09  T:  05/06/2011 03:45:05  Job #:  [id number]

## 2011-05-13 ENCOUNTER — Emergency Department (HOSPITAL_COMMUNITY): Payer: Medicare Other

## 2011-05-13 ENCOUNTER — Emergency Department (HOSPITAL_COMMUNITY)
Admission: EM | Admit: 2011-05-13 | Discharge: 2011-05-13 | Disposition: A | Discharge status: Home / Self Care | Payer: Medicare Other | Attending: Emergency Medicine | Admitting: Emergency Medicine

## 2011-05-13 ENCOUNTER — Encounter (HOSPITAL_COMMUNITY): Payer: Self-pay | Admitting: *Deleted

## 2011-05-13 DIAGNOSIS — E119 Type 2 diabetes mellitus without complications: Secondary | ICD-10-CM | POA: Insufficient documentation

## 2011-05-13 DIAGNOSIS — R4789 Other speech disturbances: Secondary | ICD-10-CM | POA: Insufficient documentation

## 2011-05-13 DIAGNOSIS — R509 Fever, unspecified: Secondary | ICD-10-CM | POA: Insufficient documentation

## 2011-05-13 DIAGNOSIS — E7800 Pure hypercholesterolemia, unspecified: Secondary | ICD-10-CM | POA: Insufficient documentation

## 2011-05-13 DIAGNOSIS — R059 Cough, unspecified: Secondary | ICD-10-CM | POA: Insufficient documentation

## 2011-05-13 DIAGNOSIS — M2550 Pain in unspecified joint: Secondary | ICD-10-CM | POA: Insufficient documentation

## 2011-05-13 DIAGNOSIS — I10 Essential (primary) hypertension: Secondary | ICD-10-CM | POA: Insufficient documentation

## 2011-05-13 DIAGNOSIS — IMO0001 Reserved for inherently not codable concepts without codable children: Secondary | ICD-10-CM | POA: Insufficient documentation

## 2011-05-13 DIAGNOSIS — F29 Unspecified psychosis not due to a substance or known physiological condition: Secondary | ICD-10-CM | POA: Insufficient documentation

## 2011-05-13 DIAGNOSIS — Z794 Long term (current) use of insulin: Secondary | ICD-10-CM | POA: Insufficient documentation

## 2011-05-13 DIAGNOSIS — R10819 Abdominal tenderness, unspecified site: Secondary | ICD-10-CM | POA: Insufficient documentation

## 2011-05-13 DIAGNOSIS — Z79899 Other long term (current) drug therapy: Secondary | ICD-10-CM | POA: Insufficient documentation

## 2011-05-13 DIAGNOSIS — R05 Cough: Secondary | ICD-10-CM | POA: Insufficient documentation

## 2011-05-13 DIAGNOSIS — R110 Nausea: Secondary | ICD-10-CM | POA: Insufficient documentation

## 2011-05-13 LAB — DIFFERENTIAL
Basophils Absolute: 0 10*3/uL (ref 0.0–0.1)
Lymphocytes Relative: 43 % (ref 12–46)
Lymphs Abs: 4.2 10*3/uL — ABNORMAL HIGH (ref 0.7–4.0)
Neutro Abs: 4.6 10*3/uL (ref 1.7–7.7)

## 2011-05-13 LAB — URINALYSIS, ROUTINE W REFLEX MICROSCOPIC
Bilirubin Urine: NEGATIVE
Hgb urine dipstick: NEGATIVE
Ketones, ur: NEGATIVE mg/dL
Protein, ur: NEGATIVE mg/dL
Urobilinogen, UA: 0.2 mg/dL (ref 0.0–1.0)

## 2011-05-13 LAB — COMPREHENSIVE METABOLIC PANEL
ALT: 22 U/L (ref 0–35)
AST: 24 U/L (ref 0–37)
Alkaline Phosphatase: 72 U/L (ref 39–117)
CO2: 26 mEq/L (ref 19–32)
Chloride: 101 mEq/L (ref 96–112)
GFR calc non Af Amer: >90 mL/min (ref >90)
Sodium: 137 mEq/L (ref 135–145)
Total Bilirubin: 0.2 mg/dL — ABNORMAL LOW (ref 0.3–1.2)

## 2011-05-13 LAB — CBC
HCT: 35 % — ABNORMAL LOW (ref 36.0–46.0)
Platelets: 246 10*3/uL (ref 150–400)
RBC: 4.12 MIL/uL (ref 3.87–5.11)
RDW: 14.5 % (ref 11.5–15.5)
WBC: 9.6 10*3/uL (ref 4.0–10.5)

## 2011-05-13 LAB — ACETAMINOPHEN LEVEL: Acetaminophen (Tylenol), Serum: <15 ug/mL (ref 10–30)

## 2011-05-13 LAB — URINE MICROSCOPIC-ADD ON

## 2011-05-13 MED ORDER — SODIUM CHLORIDE 0.9 % IV BOLUS (SEPSIS): 1000.0000 mL | Freq: Once | INTRAVENOUS | Status: DC

## 2011-05-13 MED ORDER — SODIUM CHLORIDE 0.9 % IV BOLUS (SEPSIS)
1000.0000 mL | Freq: Once | INTRAVENOUS | Status: AC
  Administered 2011-05-13: 1000 mL via INTRAVENOUS

## 2011-05-13 MED ORDER — KETOROLAC TROMETHAMINE 30 MG/ML IJ SOLN
30.0000 mg | Freq: Once | INTRAMUSCULAR | Status: AC
  Administered 2011-05-13: 30 mg via INTRAVENOUS
  Filled 2011-05-13: qty 1

## 2011-05-13 MED ORDER — MORPHINE SULFATE 4 MG/ML IJ SOLN
4.0000 mg | Freq: Once | INTRAMUSCULAR | Status: AC
  Administered 2011-05-13: 4 mg via INTRAVENOUS
  Filled 2011-05-13: qty 1

## 2011-05-13 NOTE — ED Notes (Signed)
Pt without changes ,still with pain but better. Pt a/o x 3 skin warm and dry,resp even and unlabored.

## 2011-05-13 NOTE — ED Provider Notes (Signed)
History     CSN: [id number]  Arrival date & time 05/13/11  4098   First MD Initiated Contact with Patient 05/13/11 1101      Chief Complaint  Patient presents with  . Generalized Body Aches    (Consider location/radiation/quality/duration/timing/severity/associated sxs/prior treatment) HPI The patient is a 56 year old, female, with insulin-dependent diabetes, hypertension, and hypercholesterolemia.  She presents to the emergency department complaining of myalgias, arthralgias, nausea, fevers, and cough.  Her daughter states that she has been confused recently.  Her daughter also states that her speech seems slurred.  The patient denies vomiting, diarrhea, urinary tract symptoms.  She has not changed any medications.  Past Medical History  Diagnosis Date  . Panic disorder with agoraphobia   . Hypertension   . History of frostbite     Left breast  . Dysfunctional uterine bleeding     sp hysterectomy  . Right shoulder pain     bone cracked but went into the muscle.  . Left shoulder pain     torn tendon  . Toe fracture 2001/2002    hx of -ring toe  . Temporal arteritis 5/09    Biopsy demonstrated  . Diabetes mellitus 6/09  . History of acute cystitis 2003    Complicated course post vaginal hysterectomy , required abx bladder instillation x 2 by urology  . Gastritis     Past Surgical History  Procedure Date  . Vaginal hysterectomy 2003    with oophorectomy  . Temporal artery biopsy / ligation 6/09  . Cystoscopy     With hydrolic bladder distension  . Cholecystectomy     Family History  Problem Relation Age of Onset  . Hypertension Mother   . Aneurysm Maternal Aunt     History  Substance Use Topics  . Smoking status: Never Smoker   . Smokeless tobacco: Not on file  . Alcohol Use: Yes     Rarely.     OB History    Grav Para Term Preterm Abortions TAB SAB Ect Mult Living                  Review of Systems  Constitutional: Positive for fever and chills.  Negative for diaphoresis.  HENT: Negative for congestion and neck pain.   Eyes: Negative for redness and visual disturbance.  Respiratory: Positive for cough. Negative for chest tightness and shortness of breath.   Cardiovascular: Negative for chest pain.  Gastrointestinal: Positive for nausea. Negative for vomiting, abdominal pain and diarrhea.  Genitourinary: Negative for dysuria.  Musculoskeletal: Positive for myalgias and arthralgias. Negative for back pain.  Skin: Negative for rash.  Neurological: Positive for speech difficulty and headaches. Negative for light-headedness and numbness.  Psychiatric/Behavioral: Positive for confusion.    Allergies  Citric acid; Oxycodone-acetaminophen; and Propoxyphene n-acetaminophen  Home Medications   Current Outpatient Rx  Name Route Sig Dispense Refill  . GLIPIZIDE 5 MG PO TABS Oral Take 2 tablets (10 mg total) by mouth daily. 60 tablet 6  . HYDROCODONE-ACETAMINOPHEN 10-325 MG PO TABS Oral Take 1-2 tablets by mouth 2 (two) times daily as needed. Takes 2 tablets in the morning and 1 every evening    . INSULIN ISOPHANE HUMAN 100 UNIT/ML [place] SUSP Subcutaneous Inject into the skin. Inject 42 U subcutaneously in the morning and 26 units at bedtime daily.     . INSULIN REGULAR HUMAN 100 UNIT/ML IJ SOLN Subcutaneous Inject into the skin 3 (three) times daily before meals. Inject into the skin of your  abdomen using 2:50 correction scale plus 1:15 food scale as instructed 3 times a day 30 mins before meals.     Marland Kitchen METFORMIN HCL 1000 MG PO TABS Oral Take 1 tablet (1,000 mg total) by mouth 2 (two) times daily with a meal. 60 tablet 11  . METHADONE HCL 10 MG PO TABS Oral Take 10 mg by mouth 2 (two) times daily.      Marland Kitchen METOCLOPRAMIDE HCL 5 MG PO TABS Oral Take 5 mg by mouth 4 (four) times daily. Take 1 tablet by mouth before each meal and before bedtime.     Marland Kitchen METOPROLOL SUCCINATE ER 50 MG PO TB24 Oral Take 1 tablet (50 mg total) by mouth daily. 30 tablet 11  .  OMEPRAZOLE MAGNESIUM 20 MG PO TBEC Oral Take 20 mg by mouth daily.      Marland Kitchen SIMVASTATIN 20 MG PO TABS Oral Take 20 mg by mouth at bedtime.      . VENLAFAXINE HCL ER 75 MG PO CP24 Oral Take 75 mg by mouth daily.      Marland Kitchen GLUCOSE BLOOD VI STRP Other 1 each by Other route as needed. Use as instructed     . INSULIN SYRINGE 30G X 1/2" 1 ML MISC Does not apply by Does not apply route. Use to inject insulin 4x daily.     Marland Kitchen LANCETS MISC Does not apply by Does not apply route.        BP 135/76  Pulse 109  Temp(Src) 97.8 F (36.6 C) (Oral)  Resp 16  SpO2 97%  LMP 03/08/2002  Physical Exam  Constitutional: She is oriented to person, place, and time. She appears well-developed and well-nourished. No distress.  HENT:  Head: Normocephalic and atraumatic.  Eyes: Pupils are equal, round, and reactive to light.  Neck: Normal range of motion.  Cardiovascular: Regular rhythm and normal heart sounds.   No murmur heard.      Tachycardia  Pulmonary/Chest: Effort normal and breath sounds normal. No respiratory distress. She has no wheezes. She has no rales.  Abdominal: Soft. Bowel sounds are normal. She exhibits no distension and no mass. There is tenderness. There is no rebound and no guarding.       Diffuse mild tenderness, with no peritoneal signs  Musculoskeletal: Normal range of motion. She exhibits no edema and no tenderness.  Neurological: She is alert and oriented to person, place, and time. No cranial nerve deficit.  Skin: Skin is warm and dry. No rash noted. No erythema.  Psychiatric: She has a normal mood and affect. Her behavior is normal.       She seems a little confused.  When I ask her direct questions.  She seems to be referred to a different subjects or unrelated item.    ED Course  Procedures (including critical care time) 56 year old, female, with diabetes, hypertension, and hypercholesterolemia, presents with tachycardia, diffuse pain, cough, his thorax, fever, and subtle confusion.   On examination.  We will perform laboratory testing, including LFTs.  Tylenol level, and a CK, since she is taking Vicodin, and to assess for rhabdomyolysis.  There does not seem to be a neurological deficit.  So I do not think a CAT scan of her head is indicated.   Labs Reviewed  GLUCOSE, CAPILLARY  POCT CBG MONITORING  CBC  DIFFERENTIAL  COMPREHENSIVE METABOLIC PANEL  URINALYSIS, ROUTINE W REFLEX MICROSCOPIC  CK  ACETAMINOPHEN LEVEL   No results found.   No diagnosis found.  3:10 PM She  says she feels better A subtle confusion seems to have resolved.  She has no slurred speech.  There is no evidence of a stroke.  There is no evidence of a severe systemic illness.  MDM  Myalgias and arthralgias, with an elevated CPK. This may be related to her simvastatin.  No evidence of severe illness.  We will stop her simvastatin and have her followup with her primary care Dr.  She and her daughter are comfortable with the plan.        Nicholes Stairs, MD 05/13/11 1511

## 2011-05-13 NOTE — Discharge Instructions (Signed)
Stop your simvastatin.  Followup with your doctor for reevaluation.  Call next week to make an appointment.  Use ibuprofen 600 mg every 6 hours for pain.  Return for worse or uncontrolled symptoms

## 2011-05-13 NOTE — ED Notes (Signed)
Pt reports generalized body pain >2 weeks. Initially had n/v/d which has since resolved, now just having bodyaches. No acute distress noted at triage.

## 2011-05-20 ENCOUNTER — Encounter: Payer: Medicare Other | Admitting: Internal Medicine

## 2011-06-03 ENCOUNTER — Encounter: Payer: Medicare Other | Attending: Physical Medicine & Rehabilitation | Admitting: Physical Medicine & Rehabilitation

## 2011-06-03 DIAGNOSIS — M7530 Calcific tendinitis of unspecified shoulder: Secondary | ICD-10-CM

## 2011-06-03 DIAGNOSIS — F329 Major depressive disorder, single episode, unspecified: Secondary | ICD-10-CM | POA: Insufficient documentation

## 2011-06-03 DIAGNOSIS — M7520 Bicipital tendinitis, unspecified shoulder: Secondary | ICD-10-CM

## 2011-06-03 DIAGNOSIS — M316 Other giant cell arteritis: Secondary | ICD-10-CM | POA: Insufficient documentation

## 2011-06-03 DIAGNOSIS — F341 Dysthymic disorder: Secondary | ICD-10-CM

## 2011-06-03 DIAGNOSIS — G8929 Other chronic pain: Secondary | ICD-10-CM | POA: Insufficient documentation

## 2011-06-03 DIAGNOSIS — IMO0001 Reserved for inherently not codable concepts without codable children: Secondary | ICD-10-CM

## 2011-06-03 DIAGNOSIS — M25569 Pain in unspecified knee: Secondary | ICD-10-CM | POA: Insufficient documentation

## 2011-06-03 DIAGNOSIS — M25519 Pain in unspecified shoulder: Secondary | ICD-10-CM | POA: Insufficient documentation

## 2011-06-03 DIAGNOSIS — F3289 Other specified depressive episodes: Secondary | ICD-10-CM | POA: Insufficient documentation

## 2011-06-03 DIAGNOSIS — M19019 Primary osteoarthritis, unspecified shoulder: Secondary | ICD-10-CM | POA: Insufficient documentation

## 2011-06-03 NOTE — Assessment & Plan Note (Signed)
Morgan Wilkerson is back regarding her chronic pain syndrome.  I last saw her in September and she has been seeing my nurse in the interim.  Morgan Wilkerson is back today complaining of diffuse pain.  She states that it has been a 9- 10/10.  She has gone to the ED at least once since I saw her, stating that she could not move.  She complains of pain from the head to her toes.  She has shoulder pain and knee pain as well as ankle symptoms. She has felt weak at times as well.  She last saw [facility] in November, uses a crutch for unloading the ankle and for the support.  REVIEW OF SYSTEMS:  The patient denies any bowel or bladder issues. Cognitively, she has been stable.  She has been sleeping fairly well with the Elavil.  The Effexor helps her with her mood.  SOCIAL HISTORY:  Unchanged.  PHYSICAL EXAMINATION:  VITAL SIGNS:  Blood pressure is 133/74, pulse 111, respiratory rate is 16 and she is satting 100% on room air. GENERAL:  The patient is generally alert, although she appears to be in a bit of malaise which is not too uncommon for her.  She appears depressed. EXTREMITIES:  Gait is wide-based and she uses the crutch to unload the ankles.  She is diffusely tender with palpation from the neck, shoulders, elbows, hands, trunk, knees and all way down to the feet.  No focal weakness could be appreciated except for some pain inhibition at the shoulders and ankle.  Sensory exam is grossly intact.  Reflexes are 1+.  Weight is stable.  No obvious joint deformities are seen.  ASSESSMENT: 1. Fibromyalgia syndrome. 2. 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 3. Myofascial shoulder pain. 4. Temporal arteritis. 5. Depression. 6. History of insomnia.  PLAN: 1. I refilled her Norco 10/325 #120 and methadone 10 mg #60. 2. We will start Neurontin for general fibromyalgia pain 100 mg t.i.d. 3. Increase Effexor to 150 mg b.i.d. 4. Ordered a battery of lab tests today including CBC, CMET,  B12,     folate, thyroid function tests, sed rate, rheumatoid factor, ANA,     vitamin D level, magnesium levels.  He will return if anything     positive.  I will make appropriate referrals.  My suspicion that     this is largely fibromyalgia related pain superimposed upon her     arthrodesis and mood related problems. 5. We will see her back here in about a month with Nurse Clinic and 2     months with me.     Ranelle Oyster, M.D. Electronically Signed    ZTS/MedQ D:  06/03/2011 12:03:06  T:  06/03/2011 56:21:30  Job #:  [id number]

## 2011-06-10 ENCOUNTER — Encounter: Payer: Medicare Other | Admitting: Internal Medicine

## 2011-06-16 ENCOUNTER — Other Ambulatory Visit: Payer: Self-pay | Admitting: *Deleted

## 2011-06-16 MED ORDER — INSULIN REGULAR HUMAN 100 UNIT/ML IJ SOLN: INTRAMUSCULAR | Status: DC

## 2011-06-16 MED ORDER — INSULIN NPH (HUMAN) (ISOPHANE) 100 UNIT/ML ~~LOC~~ SUSP: SUBCUTANEOUS | Status: DC

## 2011-07-07 ENCOUNTER — Encounter: Payer: Self-pay | Admitting: Internal Medicine

## 2011-07-07 ENCOUNTER — Ambulatory Visit (INDEPENDENT_AMBULATORY_CARE_PROVIDER_SITE_OTHER): Payer: Medicare Other | Admitting: Internal Medicine

## 2011-07-07 VITALS — BP 131/78 | HR 100 | Temp 97.7°F | Wt 188.4 lb

## 2011-07-07 DIAGNOSIS — M797 Fibromyalgia: Secondary | ICD-10-CM

## 2011-07-07 DIAGNOSIS — Z79899 Other long term (current) drug therapy: Secondary | ICD-10-CM

## 2011-07-07 DIAGNOSIS — F4001 Agoraphobia with panic disorder: Secondary | ICD-10-CM

## 2011-07-07 DIAGNOSIS — R5382 Chronic fatigue, unspecified: Secondary | ICD-10-CM

## 2011-07-07 DIAGNOSIS — G8929 Other chronic pain: Secondary | ICD-10-CM

## 2011-07-07 DIAGNOSIS — R928 Other abnormal and inconclusive findings on diagnostic imaging of breast: Secondary | ICD-10-CM

## 2011-07-07 DIAGNOSIS — E119 Type 2 diabetes mellitus without complications: Secondary | ICD-10-CM

## 2011-07-07 LAB — POCT GLYCOSYLATED HEMOGLOBIN (HGB A1C): Hemoglobin A1C: 5.9

## 2011-07-07 MED ORDER — KETOROLAC TROMETHAMINE 30 MG/ML IJ SOLN
30.0000 mg | Freq: Once | INTRAMUSCULAR | Status: DC
Start: 2011-07-07 — End: 2011-07-07

## 2011-07-07 MED ORDER — AMITRIPTYLINE HCL 150 MG PO TABS: 150.0000 mg | ORAL_TABLET | Freq: Every day | ORAL | Status: DC

## 2011-07-07 MED ORDER — KETOROLAC TROMETHAMINE 30 MG/ML IJ SOLN
30.0000 mg | Freq: Once | INTRAMUSCULAR | Status: AC
  Administered 2011-07-07: 30 mg via INTRAMUSCULAR

## 2011-07-07 NOTE — Assessment & Plan Note (Addendum)
Patient does not have her meter available for review today. She denies any hypoglycemia or significant hyperglycemia and states she is doing well controlling her blood glucose.  We'll check her hemoglobin A1c today.  Advised her to bring her meter with her to her next appointment for review.  Will refer for annual eye exam today.  WIll perform diabetic foot exam at her next appointment when she is not in severe discomfort.  Will refer her for her annual ophthalmologic eye exam today.

## 2011-07-07 NOTE — Assessment & Plan Note (Signed)
Patient is currently following with Dr. Hermelinda Medicus for management of her chronic pain.  She states she is not sleeping well as a result of her pain and notes 200 mg of amitriptyline at night causes her to feel very sleepy during the day.  Will give her a Toradol injection today to treat her acute exacerbation of chronic pain.  Informed patient this is not an ideal medication for long-term use but that she may want to discuss similar medications for control of her pain with Dr. Hermelinda Medicus.  We'll also increase or amitriptyline to 150 mg at night to help improve her symptoms of insomnia and also her chronic pain.

## 2011-07-07 NOTE — Patient Instructions (Signed)
Please schedule a follow up appointment in May. Keep taking all of your medications as directed. ER amitriptyline was increased to 150 mg at night. This should help you sleep better and may also help with your pain. The injection you received today is a medicine called Ketorolac.  As your pain specialist about similar medications that may help your pain. Hope you feel better!

## 2011-07-07 NOTE — Progress Notes (Signed)
Patient ID: Morgan Wilkerson, female   DOB: 08/27/1955, 56 y.o.   MRN: [id number]  Subjective:    Patient ID: Morgan Wilkerson, female    DOB: 10-20-1955, 56 y.o.   MRN: [id number]  HPI Please see the A&P for the status of the pt's chronic medical problems.  Pt reports continued, constant pain.    Review of Systems  Constitutional: Negative for fever, chills, diaphoresis, activity change, appetite change and fatigue.  HENT: Negative for hearing loss, facial swelling and neck stiffness.   Eyes: Negative for photophobia, pain and visual disturbance.  Respiratory: Negative for shortness of breath and wheezing.   Cardiovascular: Negative for chest pain.  Gastrointestinal: Negative for nausea, vomiting, abdominal pain, diarrhea, constipation and blood in stool.  Genitourinary: Negative for dysuria and hematuria.  Musculoskeletal: Positive for myalgias, back pain and arthralgias. Negative for joint swelling.  Skin: Negative for rash.  Neurological: Negative for dizziness, seizures, syncope, light-headedness and numbness.       Objective:   Physical Exam    VItal signs reviewed and stable.  BP elevated above goal today. GEN: No apparent distress.  Alert and oriented x 3.  Pleasant, conversant, and cooperative to exam. HEENT: head is autraumatic and normocephalic.  Neck is supple without palpable masses or lymphadenopathy.  No JVD or carotid bruits.  Vision intact.  EOMI.  PERRLA.  Sclerae anicteric.  Conjunctivae without pallor or injection. Mucous membranes are moist.  Oropharynx is without erythema, exudates, or other abnormal lesions. RESP:  Lungs are clear to ascultation bilaterally with good air movement.  No wheezes, ronchi, or rubs. CARDIOVASCULAR: regular rate, normal rhythm.  Clear S1, S2, no murmurs, gallops, or rubs. ABDOMEN: soft, non-tender, non-distended.  Bowels sounds present in all quadrants and normoactive.  No palpable masses. EXT: warm and dry.  Peripheral pulses equal, intact,  and +2 globally.  No clubbing or cyanosis.  Trace edema in bilateral lower extremities. SKIN: warm and dry with normal turgor.  No rashes or abnormal lesions observed. NEURO: CN II-XII grossly intact.  Muscle strength +5/5 in bilateral upper and lower extremities.  Sensation is grossly intact.  No focal deficit.     Assessment & Plan:

## 2011-07-18 ENCOUNTER — Encounter: Payer: Medicare Other | Attending: Physical Medicine & Rehabilitation | Admitting: *Deleted

## 2011-07-18 ENCOUNTER — Encounter: Payer: Self-pay | Admitting: *Deleted

## 2011-07-18 VITALS — BP 152/70 | HR 115 | Resp 18 | Ht 63.5 in | Wt 187.0 lb

## 2011-07-18 DIAGNOSIS — M1710 Unilateral primary osteoarthritis, unspecified knee: Secondary | ICD-10-CM

## 2011-07-18 DIAGNOSIS — G894 Chronic pain syndrome: Secondary | ICD-10-CM | POA: Insufficient documentation

## 2011-07-18 DIAGNOSIS — M25519 Pain in unspecified shoulder: Secondary | ICD-10-CM | POA: Insufficient documentation

## 2011-07-18 DIAGNOSIS — M19019 Primary osteoarthritis, unspecified shoulder: Secondary | ICD-10-CM | POA: Insufficient documentation

## 2011-07-18 DIAGNOSIS — M7530 Calcific tendinitis of unspecified shoulder: Secondary | ICD-10-CM

## 2011-07-18 DIAGNOSIS — IMO0001 Reserved for inherently not codable concepts without codable children: Secondary | ICD-10-CM

## 2011-07-18 DIAGNOSIS — F329 Major depressive disorder, single episode, unspecified: Secondary | ICD-10-CM | POA: Insufficient documentation

## 2011-07-18 DIAGNOSIS — M316 Other giant cell arteritis: Secondary | ICD-10-CM | POA: Insufficient documentation

## 2011-07-18 DIAGNOSIS — F3289 Other specified depressive episodes: Secondary | ICD-10-CM | POA: Insufficient documentation

## 2011-07-18 MED ORDER — HYDROCODONE-ACETAMINOPHEN 10-325 MG PO TABS: 1.0000 | ORAL_TABLET | Freq: Four times a day (QID) | ORAL | Status: DC | PRN

## 2011-07-18 MED ORDER — METHADONE HCL 10 MG PO TABS: 10.0000 mg | ORAL_TABLET | Freq: Two times a day (BID) | ORAL | Status: DC

## 2011-07-18 NOTE — Progress Notes (Signed)
Ms Crossno is walking w/ crutches today due to leg pain and instability. She has rescheduled her appointment a few times. She was in ED for dehydration and uncontrolled diabetes.  She ran out of norco on Saturday, still had some methadone left. I discussed w/ her that although Dr Riley Kill sees her every 2 months, she should call us for a refill Rx and not run out of her medicine. A relative she designates can pick the Rx up for her. Verbalized understanding. Labs drawn in January reviewed. (3 attempts made to review by phone). Printed instructions for followup provided. States keeps meds in a safe place.

## 2011-07-18 NOTE — Patient Instructions (Signed)
Take a multivitamin with iron for mild anemia. Follow up with primary care for this.  Dr Riley Kill suggests appointment with your rheumatologist for elevated "Sed Rate" indicating arthritis.

## 2011-07-22 NOTE — Assessment & Plan Note (Signed)
Will refer for annual mammogram today.

## 2011-08-11 ENCOUNTER — Encounter: Payer: Self-pay | Admitting: Physical Medicine & Rehabilitation

## 2011-08-12 ENCOUNTER — Telehealth: Payer: Self-pay | Admitting: *Deleted

## 2011-08-12 NOTE — Telephone Encounter (Signed)
Needs refill of vicodin and methadone. Last Rx 07-18-11. Has app't scheduled in May.

## 2011-08-15 ENCOUNTER — Emergency Department (HOSPITAL_COMMUNITY)
Admission: EM | Admit: 2011-08-15 | Discharge: 2011-08-15 | Disposition: A | Discharge status: Home / Self Care | Payer: Medicare Other | Attending: Emergency Medicine | Admitting: Emergency Medicine

## 2011-08-15 ENCOUNTER — Emergency Department (HOSPITAL_COMMUNITY): Payer: Medicare Other

## 2011-08-15 ENCOUNTER — Encounter (HOSPITAL_COMMUNITY): Payer: Self-pay | Admitting: Emergency Medicine

## 2011-08-15 DIAGNOSIS — M25519 Pain in unspecified shoulder: Secondary | ICD-10-CM | POA: Insufficient documentation

## 2011-08-15 DIAGNOSIS — I10 Essential (primary) hypertension: Secondary | ICD-10-CM | POA: Insufficient documentation

## 2011-08-15 DIAGNOSIS — M2550 Pain in unspecified joint: Secondary | ICD-10-CM | POA: Insufficient documentation

## 2011-08-15 DIAGNOSIS — E119 Type 2 diabetes mellitus without complications: Secondary | ICD-10-CM | POA: Insufficient documentation

## 2011-08-15 DIAGNOSIS — R000 Tachycardia, unspecified: Secondary | ICD-10-CM | POA: Insufficient documentation

## 2011-08-15 MED ORDER — HYDROCODONE-ACETAMINOPHEN 10-325 MG PO TABS: 1.0000 | ORAL_TABLET | Freq: Four times a day (QID) | ORAL | Status: DC | PRN

## 2011-08-15 MED ORDER — IBUPROFEN 200 MG PO TABS
400.0000 mg | ORAL_TABLET | Freq: Once | ORAL | Status: AC
  Administered 2011-08-15: 400 mg via ORAL
  Filled 2011-08-15: qty 2

## 2011-08-15 MED ORDER — METHADONE HCL 10 MG PO TABS: 10.0000 mg | ORAL_TABLET | Freq: Two times a day (BID) | ORAL | Status: DC

## 2011-08-15 MED ORDER — HYDROMORPHONE HCL PF 1 MG/ML IJ SOLN
1.0000 mg | Freq: Once | INTRAMUSCULAR | Status: AC
  Administered 2011-08-15: 1 mg via INTRAMUSCULAR
  Filled 2011-08-15: qty 1

## 2011-08-15 MED ORDER — DIAZEPAM 5 MG PO TABS
5.0000 mg | ORAL_TABLET | Freq: Once | ORAL | Status: AC
  Administered 2011-08-15: 5 mg via ORAL
  Filled 2011-08-15: qty 1

## 2011-08-15 NOTE — Discharge Instructions (Signed)
Arthralgia Your caregiver has diagnosed you as suffering from an arthralgia. Arthralgia means there is pain in a joint. This can come from many reasons including:  Bruising the joint which causes soreness (inflammation) in the joint.   Wear and tear on the joints which occur as we grow older (osteoarthritis).   Overusing the joint.   Various forms of arthritis.   Infections of the joint.  Regardless of the cause of pain in your joint, most of these different pains respond to anti-inflammatory drugs and rest. The exception to this is when a joint is infected, and these cases are treated with antibiotics, if it is a bacterial infection. HOME CARE INSTRUCTIONS   Rest the injured area for as long as directed by your caregiver. Then slowly start using the joint as directed by your caregiver and as the pain allows. Crutches as directed may be useful if the ankles, knees or hips are involved. If the knee was splinted or casted, continue use and care as directed. If an stretchy or elastic wrapping bandage has been applied today, it should be removed and re-applied every 3 to 4 hours. It should not be applied tightly, but firmly enough to keep swelling down. Watch toes and feet for swelling, bluish discoloration, coldness, numbness or excessive pain. If any of these problems (symptoms) occur, remove the ace bandage and re-apply more loosely. If these symptoms persist, contact your caregiver or return to this location.   For the first 24 hours, keep the injured extremity elevated on pillows while lying down.   Apply ice for 15 to 20 minutes to the sore joint every couple hours while awake for the first half day. Then 3 to 4 times per day for the first 48 hours. Put the ice in a plastic bag and place a towel between the bag of ice and your skin.   Wear any splinting, casting, elastic bandage applications, or slings as instructed.   Only take over-the-counter or prescription medicines for pain,  discomfort, or fever as directed by your caregiver. Do not use aspirin immediately after the injury unless instructed by your physician. Aspirin can cause increased bleeding and bruising of the tissues.   If you were given crutches, continue to use them as instructed and do not resume weight bearing on the sore joint until instructed.  Persistent pain and inability to use the sore joint as directed for more than 2 to 3 days are warning signs indicating that you should see a caregiver for a follow-up visit as soon as possible. Initially, a hairline fracture (break in bone) may not be evident on X-rays. Persistent pain and swelling indicate that further evaluation, non-weight bearing or use of the joint (use of crutches or slings as instructed), or further X-rays are indicated. X-rays may sometimes not show a small fracture until a week or 10 days later. Make a follow-up appointment with your own caregiver or one to whom we have referred you. A radiologist (specialist in reading X-rays) may read your X-rays. Make sure you know how you are to obtain your X-ray results. Do not assume everything is normal if you do not hear from us. SEEK MEDICAL CARE IF: Bruising, swelling, or pain increases. SEEK IMMEDIATE MEDICAL CARE IF:   Your fingers or toes are numb or blue.   The pain is not responding to medications and continues to stay the same or get worse.   The pain in your joint becomes severe.   You develop a fever over   102 F (38.9 C).   It becomes impossible to move or use the joint.  MAKE SURE YOU:   Understand these instructions.   Will watch your condition.   Will get help right away if you are not doing well or get worse.  Document Released: 04/25/2005 Document Revised: 04/14/2011 Document Reviewed: 12/12/2007 Exit[employer] Patient Information 2012 [employer]  RESOURCE GUIDE  Dental Problems  Patients with Medicaid: [facility]                     [facility] [address]                                           [address] Phone:  [phone]                                                  Phone:  [phone]  If unable to pay or uninsured, contact:  [facility] or [facility] to become qualified for the adult dental clinic.  Chronic Pain Problems Contact [facility]  [phone] Patients need to be referred by their primary care doctor.  Insufficient Money for Medicine Contact [employer]:  call "211" or [facility] 271-5999.  No Primary Care Doctor Call [facility]  832-8000 Other agencies that provide inexpensive medical care    [facility]  832-8035    [facility]  832-7272    [facility]  [phone]    Women's Clinic  832-4777    [facility]  [phone]    [facility]  [phone]  Psychological Services  Health  [phone] [facility]  [phone] [facility]   800 853-5163 (emergency services 641-4993)  Substance Abuse Resources [facility]  336-882-2125 [facility] 336-784-9470 [facility] 336-285-9073 [facility] 336-845-3988 Residential & Outpatient Substance Abuse Program  800-659-3381  Abuse/Neglect [place] Child Abuse Hotline (336) 641-3795 [place] Child Abuse Hotline 800-378-5315 (After Hours)  Emergency Shelter [facility] (336) 271-5985  [facility] (336) 275-9566 [facility] (704) 372-4663  MRSA Hotline #:   832-7006    [place] Resources  [facility] [place]     [employer]                          [facility] [address] [place]                       [address]      [address]  [place]                                                [place]                             [place] Phone:  [phone]                                     Phone:  [phone]                 Phone:  [phone]  [facility] Phone:  342-8316  [place] Child Abuse Hotline (336) 342-1394 (336) 342-3537 (After Hours)   

## 2011-08-15 NOTE — Telephone Encounter (Signed)
Done

## 2011-08-15 NOTE — ED Notes (Signed)
Pt reports pain in both arms (left x4 months, right x2-59months, more severe this past month) and both legs as well as knees (x4 months). Also difficulty standing for a long time.

## 2011-08-15 NOTE — ED Provider Notes (Signed)
History    56 year old female with pain in multiple areas. Patient is complaining of bilateral shoulder pain, left worse than right. Has been going on for several months. Denies acute trauma. Patient is also complaining of pain in bilateral knees. She states that she has been using crutches yesterday pain has been severe and she feels off balance without them. No fevers or chills. No numbness or tingling. Denies or vomiting. No rash.  CSN: [id number]  Arrival date & time 08/15/11  1204   First MD Initiated Contact with Patient 08/15/11 1224      Chief Complaint  Patient presents with  . Pain    (Consider location/radiation/quality/duration/timing/severity/associated sxs/prior treatment) HPI  Past Medical History  Diagnosis Date  . Panic disorder with agoraphobia   . Hypertension   . History of frostbite     Left breast  . Dysfunctional uterine bleeding     sp hysterectomy  . Right shoulder pain     bone cracked but went into the muscle.  . Left shoulder pain     torn tendon  . Toe fracture 2001/2002    hx of -ring toe  . Temporal arteritis 5/09    Biopsy demonstrated  . Diabetes mellitus 6/09  . History of acute cystitis 2003    Complicated course post vaginal hysterectomy , required abx bladder instillation x 2 by urology  . Gastritis   . Fibromyalgia   . Bicipital tenosynovitis   . Calcifying tendinitis of shoulder   . Anxiety   . Depression   . Osteoarthrosis, unspecified whether generalized or localized, lower leg     Past Surgical History  Procedure Date  . Vaginal hysterectomy 2003    with oophorectomy  . Temporal artery biopsy / ligation 6/09  . Cystoscopy     With hydrolic bladder distension  . Cholecystectomy     Family History  Problem Relation Age of Onset  . Hypertension Mother   . Aneurysm Maternal Aunt     History  Substance Use Topics  . Smoking status: Never Smoker   . Smokeless tobacco: Not on file  . Alcohol Use: Yes     Rarely.      OB History    Grav Para Term Preterm Abortions TAB SAB Ect Mult Living                  Review of Systems   Review of symptoms negative unless otherwise noted in HPI.    Allergies  Childrens advil; Citric acid; Oxycodone-acetaminophen; and Propoxacet-n  Home Medications   Current Outpatient Rx  Name Route Sig Dispense Refill  . AMITRIPTYLINE HCL 150 MG PO TABS Oral Take 1 tablet (150 mg total) by mouth at bedtime. 30 tablet 3  . CALCIUM CARBONATE 1250 MG PO CHEW Oral Chew 1 tablet by mouth daily.    . CYCLOBENZAPRINE HCL 10 MG PO TABS Oral Take 10 mg by mouth 2 (two) times daily.    Marland Kitchen HYDROCODONE-ACETAMINOPHEN 10-325 MG PO TABS Oral Take 1 tablet by mouth every 6 (six) hours as needed. 120 tablet 0  . METHADONE HCL 10 MG PO TABS Oral Take 1 tablet (10 mg total) by mouth 2 (two) times daily. 60 tablet 0  . METOCLOPRAMIDE HCL 5 MG PO TABS Oral Take 5 mg by mouth 4 (four) times daily. Take 1 tablet by mouth before each meal and before bedtime.     Marland Kitchen METOPROLOL SUCCINATE ER 50 MG PO TB24 Oral Take 1 tablet (50 mg total)  by mouth daily. 30 tablet 11  . VENLAFAXINE HCL 75 MG PO TABS Oral Take 150 mg by mouth 2 (two) times daily.    Marland Kitchen METFORMIN HCL 1000 MG PO TABS Oral Take 1 tablet (1,000 mg total) by mouth 2 (two) times daily with a meal. 60 tablet 0    BP 145/91  Pulse 117  Temp(Src) 99.5 F (37.5 C) (Oral)  Resp 20  SpO2 100%  LMP 03/08/2002  Physical Exam  Nursing note and vitals reviewed. Constitutional: She appears well-developed and well-nourished. No distress.  HENT:  Head: Normocephalic and atraumatic.  Eyes: Conjunctivae are normal. Right eye exhibits no discharge. Left eye exhibits no discharge.  Neck: Neck supple.  Cardiovascular: Regular rhythm and normal heart sounds.  Exam reveals no gallop and no friction rub.   No murmur heard.      Mild tachycardia with a regular rhythm  Pulmonary/Chest: Effort normal and breath sounds normal. No respiratory distress.   Abdominal: Soft. She exhibits no distension. There is no tenderness.  Musculoskeletal: She exhibits tenderness. She exhibits no edema.       Shoulders grossly normal in appearance and symmetric as compared to each other. No external signs of trauma. Diffuse tenderness of the left anterior shoulder exacerbated with movement particularly abduction. Neurovascularly intact distally. Mild diffuse tenderness of the right shoulder. He is grossly normal in appearance. There is no effusion appreciated. No ligamentous laxity appreciated. mild diffuse tenderness in bilateral knees. Neurovascular intact distally.  Neurological: She is alert.  Skin: Skin is warm and dry.  Psychiatric: She has a normal mood and affect. Her behavior is normal. Thought content normal.    ED Course  Procedures (including critical care time)  Labs Reviewed - No data to display Dg Shoulder Left  08/15/2011  *RADIOLOGY REPORT*  Clinical Data: Left shoulder pain with swelling.  No known injury.  LEFT SHOULDER - 2+ VIEW  Comparison: 11/20/2005 radiographs.  Findings: The mineralization and alignment are normal.  There is no evidence of acute fracture or dislocation.  The subacromial spaces preserved.  There are stable mild acromioclavicular and glenohumeral degenerative changes.  IMPRESSION: Stable mild degenerative changes.  No acute osseous findings.  Original Report Authenticated By: Gerrianne Scale, M.D.     1. Arthralgia       MDM  56 year old female with arthralgia. Pain in multiple joints, but with left shoulder worse than other areas. No hx of acute trauma. XR with some mild arthritic changes, otherwise normal. Suspect exacerbation of chronic pain. Likely worsened by continued use of crutches. Patient states she has a walker at home but doesn't use it. Encouraged to do so. Pain medication in the emergency room. Patient has chronic pain which she is on Norco and methadone for. Will not provide additional prescription for  narcotics. Patient needs to follow up with pain management or her family Dr.        Raeford Razor, MD 08/15/11 1327

## 2011-08-16 ENCOUNTER — Telehealth: Payer: Self-pay | Admitting: *Deleted

## 2011-08-16 NOTE — Telephone Encounter (Signed)
Pt aware that rx's are ready for pick up.

## 2011-09-14 ENCOUNTER — Encounter: Payer: Self-pay | Admitting: Physical Medicine & Rehabilitation

## 2011-09-14 ENCOUNTER — Encounter: Payer: Medicare Other | Attending: Physical Medicine & Rehabilitation | Admitting: Physical Medicine & Rehabilitation

## 2011-09-14 VITALS — BP 147/71 | HR 114 | Resp 18 | Ht 63.5 in | Wt 188.0 lb

## 2011-09-14 DIAGNOSIS — F329 Major depressive disorder, single episode, unspecified: Secondary | ICD-10-CM | POA: Insufficient documentation

## 2011-09-14 DIAGNOSIS — M1710 Unilateral primary osteoarthritis, unspecified knee: Secondary | ICD-10-CM | POA: Insufficient documentation

## 2011-09-14 DIAGNOSIS — F3289 Other specified depressive episodes: Secondary | ICD-10-CM | POA: Insufficient documentation

## 2011-09-14 DIAGNOSIS — M19019 Primary osteoarthritis, unspecified shoulder: Secondary | ICD-10-CM

## 2011-09-14 DIAGNOSIS — M170 Bilateral primary osteoarthritis of knee: Secondary | ICD-10-CM | POA: Insufficient documentation

## 2011-09-14 DIAGNOSIS — M19012 Primary osteoarthritis, left shoulder: Secondary | ICD-10-CM | POA: Insufficient documentation

## 2011-09-14 DIAGNOSIS — IMO0001 Reserved for inherently not codable concepts without codable children: Secondary | ICD-10-CM

## 2011-09-14 DIAGNOSIS — IMO0002 Reserved for concepts with insufficient information to code with codable children: Secondary | ICD-10-CM | POA: Insufficient documentation

## 2011-09-14 MED ORDER — HYDROCODONE-ACETAMINOPHEN 10-325 MG PO TABS: 1.0000 | ORAL_TABLET | Freq: Four times a day (QID) | ORAL | Status: DC | PRN

## 2011-09-14 MED ORDER — DICLOFENAC SODIUM 1 % TD GEL: 1.0000 "application " | Freq: Four times a day (QID) | TRANSDERMAL | Status: DC

## 2011-09-14 MED ORDER — KETOROLAC TROMETHAMINE 60 MG/2ML IM SOLN: 60.0000 mg | Freq: Once | INTRAMUSCULAR | Status: AC

## 2011-09-14 MED ORDER — METHADONE HCL 10 MG PO TABS: 20.0000 mg | ORAL_TABLET | Freq: Two times a day (BID) | ORAL | Status: DC

## 2011-09-14 NOTE — Patient Instructions (Signed)
Ice knees. Continue bracing and unload them as able

## 2011-09-14 NOTE — Progress Notes (Signed)
Subjective:    Patient ID: Morgan Wilkerson, female    DOB: 13-Mar-1956, 56 y.o.   MRN: [id number]  HPI  Morgan Wilkerson is back regarding her chronic pain. Her knees have been flaring up. Symptoms have increased over the last few months. They were bothering her so bad that she was in the ED recently. She acquired braces last weekend and they seem to have helped.  She reports spasm on her right side as well as her shoulder and back.  She went from crutches to a walker and this seems to have helped her balance and knees although her shoulders have been more tender.  Her knee pain tends to keep her up at night. She just tried heat and 97 has a hard time finding a lot of relief. I did review a note from her family practice physician and she did get Toradol which seemed to help somewhat.  Pain Inventory Average Pain 10 Pain Right Now 8 My pain is constant, sharp, stabbing, tingling and aching  In the last 24 hours, has pain interfered with the following? General activity 10 Relation with others 10 Enjoyment of life 10 What TIME of day is your pain at its worst? all of the time Sleep (in general) Fair  Pain is worse with: walking, bending, sitting, inactivity, standing and some activites Pain improves with: medication Relief from Meds: 2  Mobility walk with assistance use a walker ability to climb steps?  no do you drive?  no Do you have any goals in this area?  yes  Function I need assistance with the following:  dressing, bathing, toileting, meal prep, household duties and shopping Do you have any goals in this area?  yes  Neuro/Psych weakness numbness tremor tingling trouble walking spasms dizziness confusion depression anxiety  Prior Studies Any changes since last visit?  no  Physicians involved in your care Any changes since last visit?  no      Review of Systems  Musculoskeletal: Positive for gait problem.  Neurological: Positive for dizziness, tremors, weakness and  numbness.  Psychiatric/Behavioral: Positive for confusion and dysphoric mood. The patient is nervous/anxious.   All other systems reviewed and are negative.       Objective:   Physical Exam  Vitals reviewed. Constitutional: She is oriented to person, place, and time. She appears well-developed and well-nourished.  HENT:  Head: Normocephalic and atraumatic.  Eyes: Conjunctivae and EOM are normal. Pupils are equal, round, and reactive to light.  Neck: Neck supple.  Cardiovascular: Normal rate and regular rhythm.   Pulmonary/Chest: Effort normal. No respiratory distress. She has no wheezes.  Abdominal: She exhibits no distension. There is no tenderness.  Musculoskeletal:       Patient has medial joint line pain at the right knee. No crepitus or instability is appreciated at the knee nor is there any swelling seen. Left knee has a bit of peripatellar swelling with medial and lateral joint line tenderness. Meniscal maneuvers are positive bilaterally. With ambulation she was antalgic on either side but the left leg is more troublesome than the right. She took extra time to transfer from a seated to standing position. She uses her walker to help offload her legs.  She has been seen in either shoulder with rotational movements and rotator cuff impingement maneuvers.   Neurological: She is alert and oriented to person, place, and time. She has normal strength. A cranial nerve deficit is present. No sensory deficit.  Skin: Skin is warm.  Psychiatric: Her behavior  is normal. Judgment and thought content normal. Her affect is blunt. Cognition and memory are normal. She exhibits a depressed mood.          Assessment & Plan:  ASSESSMENT:  1. Fibromyalgia syndrome.  2. 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  3. Myofascial shoulder pain.  4. Temporal arteritis.  5. Depression.  6. History of insomnia.  PLAN:  1. I refilled her Norco 10/325 #120 and increased methadone to 20  mg #120 (10mg  tabs) 2. added Voltaren gel 1% to be applied 3 times a day to 4 times a day to either knee. She should continue with her knee sleeves for now and activity modification. I ordered x-rays of both knees as well. We'll followup on these as available and pursue further intervention. 3. maintain Effexor at 150 mg b.i.d.  4. patient was given a 60 mg Toradol intramuscular shot today for pain relief.  5. We will see her back here in about a month pending x-rays.

## 2011-09-20 ENCOUNTER — Encounter: Payer: Medicare Other | Admitting: Internal Medicine

## 2011-09-26 ENCOUNTER — Encounter: Payer: Medicare Other | Admitting: Internal Medicine

## 2011-09-27 ENCOUNTER — Encounter: Payer: Medicare Other | Admitting: Internal Medicine

## 2011-10-05 ENCOUNTER — Encounter: Payer: Medicare Other | Admitting: Internal Medicine

## 2011-10-05 ENCOUNTER — Ambulatory Visit
Admission: RE | Admit: 2011-10-05 | Discharge: 2011-10-05 | Disposition: A | Discharge status: Home / Self Care | Payer: Medicare Other | Source: Ambulatory Visit | Attending: Physical Medicine & Rehabilitation | Admitting: Physical Medicine & Rehabilitation

## 2011-10-05 DIAGNOSIS — M170 Bilateral primary osteoarthritis of knee: Secondary | ICD-10-CM

## 2011-10-05 DIAGNOSIS — IMO0001 Reserved for inherently not codable concepts without codable children: Secondary | ICD-10-CM

## 2011-10-05 DIAGNOSIS — M19019 Primary osteoarthritis, unspecified shoulder: Secondary | ICD-10-CM

## 2011-10-05 DIAGNOSIS — F32A Depression, unspecified: Secondary | ICD-10-CM

## 2011-10-05 DIAGNOSIS — F329 Major depressive disorder, single episode, unspecified: Secondary | ICD-10-CM

## 2011-10-06 ENCOUNTER — Encounter: Payer: Self-pay | Admitting: Internal Medicine

## 2011-10-06 ENCOUNTER — Ambulatory Visit (INDEPENDENT_AMBULATORY_CARE_PROVIDER_SITE_OTHER): Payer: Medicare Other | Admitting: Internal Medicine

## 2011-10-06 VITALS — BP 142/84 | HR 107 | Temp 97.7°F

## 2011-10-06 DIAGNOSIS — M1990 Unspecified osteoarthritis, unspecified site: Secondary | ICD-10-CM

## 2011-10-06 DIAGNOSIS — M25562 Pain in left knee: Secondary | ICD-10-CM

## 2011-10-06 DIAGNOSIS — Z79899 Other long term (current) drug therapy: Secondary | ICD-10-CM

## 2011-10-06 DIAGNOSIS — Z7409 Other reduced mobility: Secondary | ICD-10-CM

## 2011-10-06 DIAGNOSIS — E119 Type 2 diabetes mellitus without complications: Secondary | ICD-10-CM

## 2011-10-06 DIAGNOSIS — M170 Bilateral primary osteoarthritis of knee: Secondary | ICD-10-CM

## 2011-10-06 LAB — POCT GLYCOSYLATED HEMOGLOBIN (HGB A1C): Hemoglobin A1C: 6.2

## 2011-10-06 LAB — GLUCOSE, CAPILLARY: Glucose-Capillary: 140 mg/dL — ABNORMAL HIGH (ref 70–99)

## 2011-10-06 MED ORDER — KETOROLAC TROMETHAMINE 30 MG/ML IJ SOLN: 30.0000 mg | Freq: Once | INTRAMUSCULAR | Status: DC

## 2011-10-06 MED ORDER — OMEPRAZOLE 20 MG PO CPDR: 20.0000 mg | DELAYED_RELEASE_CAPSULE | Freq: Every day | ORAL | Status: DC

## 2011-10-06 MED ORDER — VENLAFAXINE HCL 75 MG PO TABS: ORAL_TABLET | ORAL | Status: DC

## 2011-10-06 NOTE — Patient Instructions (Addendum)
Your E. Effexor was increased from 300 mg total daily to 375 mg daily. This will menial take 3 tablets in the morning and 2 tablets at night. Toradol is a new medication for pain. Be sure to take this medication with food. Do not take any ibuprofen, Aleve, Naprosyn, Motrin, naproxen, Clydie Braun, diclofenac, meloxicam, or another nonsteroidal anti-inflammatory agents while you take Toradol. It is very important that you take one omeprazole tablet every day while you take Toradol. This will help protect her stomach. Continue to take all of your other medicines as directed. Be sure to keep your upcoming appointment with Dr. Hermelinda Medicus. Next and will refer you to an orthopedic surgeon to be evaluated for possible surgery of the knee. I will contact you if any of your blood work is abnormal We will help get to a wheelchair through a [facility].

## 2011-10-07 LAB — BASIC METABOLIC PANEL WITH GFR
BUN: 12 mg/dL (ref 6–23)
CO2: 28 mEq/L (ref 19–32)
Chloride: 102 mEq/L (ref 96–112)
Creat: 0.67 mg/dL (ref 0.50–1.10)
GFR, Est Non African American: >89 mL/min
Glucose, Bld: 126 mg/dL — ABNORMAL HIGH (ref 70–99)

## 2011-10-07 LAB — CBC
HCT: 36.5 % (ref 36.0–46.0)
Hemoglobin: 12.1 g/dL (ref 12.0–15.0)
MCH: 27.8 pg (ref 26.0–34.0)
RBC: 4.35 MIL/uL (ref 3.87–5.11)

## 2011-10-07 LAB — SEDIMENTATION RATE: Sed Rate: 9 mm/hr (ref 0–22)

## 2011-10-07 LAB — RHEUMATOID FACTOR: Rhuematoid fact SerPl-aCnc: <10 IU/mL (ref <=14)

## 2011-10-10 ENCOUNTER — Telehealth: Payer: Self-pay | Admitting: Physical Medicine & Rehabilitation

## 2011-10-10 NOTE — Telephone Encounter (Signed)
Arthritis is evident on knee xrays. We can follow up with further injections if needed. Recommend exercises to strengthen quads and hamstrings in the meantime.

## 2011-10-11 ENCOUNTER — Telehealth: Payer: Self-pay | Admitting: *Deleted

## 2011-10-11 NOTE — Telephone Encounter (Signed)
Pharmacy called and has Rx for Toradol IV x 1 - given Rx 10/06/11. Flag sent to Dr Arvilla Market. Stanton Kidney Jamielynn Wigley RN 10/11/11 8:30AM

## 2011-10-11 NOTE — Telephone Encounter (Signed)
Talked with Dr Arvilla Market 10/12/11 AM - pt was given this med - Toradol -given  IM during office visit and was given Rx for Toradol  PO on this med. Pharmacy aware.

## 2011-10-13 ENCOUNTER — Encounter: Payer: Self-pay | Admitting: Physical Medicine and Rehabilitation

## 2011-10-13 ENCOUNTER — Encounter: Payer: Medicare Other | Attending: Physical Medicine & Rehabilitation | Admitting: Physical Medicine and Rehabilitation

## 2011-10-13 VITALS — BP 164/91 | HR 143 | Resp 18 | Ht 63.5 in | Wt 172.8 lb

## 2011-10-13 DIAGNOSIS — M549 Dorsalgia, unspecified: Secondary | ICD-10-CM | POA: Insufficient documentation

## 2011-10-13 DIAGNOSIS — M1710 Unilateral primary osteoarthritis, unspecified knee: Secondary | ICD-10-CM | POA: Insufficient documentation

## 2011-10-13 DIAGNOSIS — M25562 Pain in left knee: Secondary | ICD-10-CM

## 2011-10-13 DIAGNOSIS — M25569 Pain in unspecified knee: Secondary | ICD-10-CM

## 2011-10-13 DIAGNOSIS — M25519 Pain in unspecified shoulder: Secondary | ICD-10-CM

## 2011-10-13 DIAGNOSIS — M19019 Primary osteoarthritis, unspecified shoulder: Secondary | ICD-10-CM

## 2011-10-13 DIAGNOSIS — E119 Type 2 diabetes mellitus without complications: Secondary | ICD-10-CM | POA: Insufficient documentation

## 2011-10-13 DIAGNOSIS — M25511 Pain in right shoulder: Secondary | ICD-10-CM

## 2011-10-13 DIAGNOSIS — F329 Major depressive disorder, single episode, unspecified: Secondary | ICD-10-CM

## 2011-10-13 DIAGNOSIS — IMO0001 Reserved for inherently not codable concepts without codable children: Secondary | ICD-10-CM

## 2011-10-13 DIAGNOSIS — I10 Essential (primary) hypertension: Secondary | ICD-10-CM | POA: Insufficient documentation

## 2011-10-13 DIAGNOSIS — F32A Depression, unspecified: Secondary | ICD-10-CM

## 2011-10-13 DIAGNOSIS — M25561 Pain in right knee: Secondary | ICD-10-CM

## 2011-10-13 DIAGNOSIS — M170 Bilateral primary osteoarthritis of knee: Secondary | ICD-10-CM

## 2011-10-13 MED ORDER — HYDROCODONE-ACETAMINOPHEN 10-325 MG PO TABS: 1.0000 | ORAL_TABLET | Freq: Four times a day (QID) | ORAL | Status: DC | PRN

## 2011-10-13 MED ORDER — METHADONE HCL 10 MG PO TABS: 20.0000 mg | ORAL_TABLET | Freq: Two times a day (BID) | ORAL | Status: DC

## 2011-10-13 NOTE — Patient Instructions (Signed)
Continue with stretching exercises, advised patient to go to her [employer], and walk some in the pool.

## 2011-10-13 NOTE — Progress Notes (Signed)
Subjective:    Patient ID: Morgan Wilkerson, female    DOB: [birth year], 56 y.o.   MRN: [id number]  HPI The patient complains about chronic bilateral knee and bilateral shoulder pain.  The patient denies any radiation. The patient also complains about some back pain.  The problem has been stable, she states, that the Voltaren gel has given her some relief.   Pain Inventory Average Pain 8 Pain Right Now 10 My pain is constant, sharp, burning, stabbing and aching  In the last 24 hours, has pain interfered with the following? General activity 0 Relation with others 0 Enjoyment of life 0 What TIME of day is your pain at its worst? all of the time Sleep (in general) Poor  Pain is worse with: walking, bending, sitting, inactivity, standing and some activites Pain improves with: rest, medication and injections Relief from Meds: 0  Mobility walk with assistance use a walker ability to climb steps?  no do you drive?  no  Function disabled: date disabled  I need assistance with the following:  dressing, bathing, toileting, meal prep, household duties and shopping  Neuro/Psych weakness numbness tremor tingling trouble walking spasms dizziness confusion depression anxiety  Prior Studies Any changes since last visit?  no  Physicians involved in your care Any changes since last visit?  no   Family History  Problem Relation Age of Onset  . Hypertension Mother   . Aneurysm Maternal Aunt    History   Social History  . Marital Status: Married    Spouse Name: N/A    Number of Children: N/A  . Years of Education: N/A   Social History Main Topics  . Smoking status: Never Smoker   . Smokeless tobacco: Never Used  . Alcohol Use: Yes     Rarely.   . Drug Use: None  . Sexually Active: None   Other Topics Concern  . None   Social History Narrative   Patients phone number is [phone]   Past Surgical History  Procedure Date  . Vaginal hysterectomy 2003    with  oophorectomy  . Temporal artery biopsy / ligation 6/09  . Cystoscopy     With hydrolic bladder distension  . Cholecystectomy    Past Medical History  Diagnosis Date  . Panic disorder with agoraphobia   . Hypertension   . History of frostbite     Left breast  . Dysfunctional uterine bleeding     sp hysterectomy  . Right shoulder pain     bone cracked but went into the muscle.  . Left shoulder pain     torn tendon  . Toe fracture 2001/2002    hx of -ring toe  . Temporal arteritis 5/09    Biopsy demonstrated  . Diabetes mellitus 6/09  . History of acute cystitis 2003    Complicated course post vaginal hysterectomy , required abx bladder instillation x 2 by urology  . Gastritis   . Fibromyalgia   . Bicipital tenosynovitis   . Calcifying tendinitis of shoulder   . Anxiety   . Depression   . Osteoarthrosis, unspecified whether generalized or localized, lower leg    BP 164/91  Pulse 143  Resp 18  Ht 5' 3.5" (1.613 m)  Wt 172 lb 12.8 oz (78.382 kg)  BMI 30.13 kg/m2  SpO2 99%  LMP 03/08/2002    Review of Systems  Musculoskeletal: Positive for gait problem.       Spasms  Neurological: Positive for dizziness, tremors, weakness and  numbness.  Psychiatric/Behavioral: Positive for confusion and dysphoric mood. The patient is nervous/anxious.   All other systems reviewed and are negative.       Objective:   Physical Exam  Constitutional: She is oriented to person, place, and time. She appears well-developed and well-nourished.       Walked with crutches  HENT:  Head: Normocephalic.  Neck: Neck supple.  Musculoskeletal: She exhibits tenderness.  Neurological: She is alert and oriented to person, place, and time.  Skin: Skin is warm and dry.  Psychiatric: She has a normal mood and affect.     Symmetric normal motor tone is noted throughout. Normal muscle bulk. Muscle testing reveals 5/5 muscle strength of the upper extremity, and 5/5 of the lower extremity. Full  range of motion in upper and lower extremities, except deficit in knee extension of 5-10 degrees bilateral, because of pain, and mild deficit in abduction with external rotation of about 10 degrees in shoulders bilateral, also because of pain.  ROM of spine is restricted. DTR in the upper and lower extremity are present and symmetric 2+. No clonus is noted.  Patient arises from chair with difficulty. Wide based gait with crutches.        Assessment & Plan:  This is a 56 year old female with 1. Fibromyalgia 2. mild osteoarthritis in knees bilateral 3. Shoulder pain 4. Back pain Plan : Patient should continue with her walking program, patient is a member in the [facility] [employer], I advised her to do some walking or exercises in the pool. Patient had x-rays of her knees done since the last visit. The x-rays show mild arthritis in the lateral compartment of her knees. Refilled methadone 10mg , the dosage prescribed to her was 20 mg twice a day. Follow up in 1 month.

## 2011-10-14 MED ORDER — KETOROLAC TROMETHAMINE 30 MG/ML IJ SOLN: 30.0000 mg | Freq: Once | INTRAMUSCULAR | Status: AC

## 2011-10-14 NOTE — Assessment & Plan Note (Signed)
Patient suffers from severe DJD that now impairs her ability to ambulate and perform ADLs (tolieting, dressing) at home.  She is currently using a walker but this is not helping improve her ability to ambulate and complete her ADLs.  A wheelchair would improve her ability to safely perform ADLs.  Her husband is at home and is able to provide some assistance.   Will write and order for a wheelchair.    I believe she may benefit from evaluation and possible surgical intervention on her left knee; will refer her to ortho today.  Will also repeat a simple rheum work up, including CBC, ESR, CCP, CRP, and RF.  Will also maximize effexor dose to improve pain control.  Advised pt to ensure she follows up with Dr. Hermelinda Medicus to discuss andditional changes to her pain regimen.  WIll give toradol injection today and send pt home with rx for oral toradol  She is advised to avoid any asa and all other NSAIDs while using toradol.  She is also advised to daily daily omprazole with toradol to help prevent ulcer formation/advsere GI effects.  Patient expressed understanding and agreed with plan.  Rx for omeprazole provided today.

## 2011-10-14 NOTE — Progress Notes (Signed)
Patient ID: Morgan Wilkerson, female   DOB: June 19, 1955, 56 y.o.   MRN: [id number]  Subjective:    HPI:  Patient reports progressive, persistent pain "everywhere" that is no longer controlled with her current medications including vicodin and methadone.  She states she is unable to walk up and down stairs and can no longer ambulate without the assistance of a walker.     Review of Systems  Constitutional: Negative for fever, chills, diaphoresis, activity change, appetite change and fatigue.  HENT: Negative for hearing loss, facial swelling and neck stiffness.   Eyes: Negative for photophobia, pain and visual disturbance.  Respiratory: Negative for shortness of breath and wheezing.   Cardiovascular: Negative for chest pain.  Gastrointestinal: Negative for nausea, vomiting, abdominal pain, diarrhea, constipation and blood in stool.  Genitourinary: Negative for dysuria and hematuria.  Musculoskeletal: Positive for myalgias, back pain and arthralgias. Negative for joint swelling.  Skin: Negative for rash.  Neurological: Negative for dizziness, seizures, syncope, light-headedness and numbness.       Objective:   Physical Exam  Vitals reviewed GEN: No apparent distress.  Alert and oriented x 3.   HEENT: head is autraumatic and normocephalic.  Neck is supple without palpable masses or lymphadenopathy.  Vision intact.  EOMI.  PERRLA.  Sclerae anicteric.  Conjunctivae without pallor or injection. Mucous membranes are moist.  Oropharynx is without erythema, exudates, or other abnormal lesions. RESP:  Lungs are clear to ascultation bilaterally with good air movement.  No wheezes, ronchi, or rubs. CARDIOVASCULAR: regular rate, normal rhythm.  Clear S1, S2, no murmurs, gallops, or rubs. ABDOMEN: soft, non-tender, non-distended.  Bowels sounds present in all quadrants and normoactive.  No palpable masses. EXT: warm and dry.  Peripheral pulses equal, intact, and +2 globally.  No clubbing or cyanosis.  No  edema in bilateral lower extremities. SKIN: warm and dry with normal turgor.  No rashes or abnormal lesions observed. NEURO: CN II-XII grossly intact.  Muscle strength +5/5 in bilateral upper and lower extremities.  Sensation is grossly intact.  No focal deficit.   Assessment & Plan:

## 2011-10-14 NOTE — Progress Notes (Signed)
Addended by: Maura Crandall on: 10/14/2011 03:07 PM   Modules accepted: Orders

## 2011-10-19 ENCOUNTER — Telehealth: Payer: Self-pay | Admitting: *Deleted

## 2011-10-19 ENCOUNTER — Ambulatory Visit: Payer: Medicare Other | Admitting: Physical Medicine & Rehabilitation

## 2011-10-19 NOTE — Telephone Encounter (Signed)
Talked to [facility] about Toradol PO Rx written by Dr Arvilla Market was for 15 mg - pharmacy states comes in 10mg  PO. Dr Arvilla Market aware and to do Toradol 10mg  two times per day. Pharmacy aware and pt aware Rx to be ready in 1 hour. Stanton Kidney Halen Antenucci RN 10/19/11 9:30AM

## 2011-11-11 ENCOUNTER — Encounter: Payer: Medicare Other | Attending: Physical Medicine & Rehabilitation | Admitting: Physical Medicine & Rehabilitation

## 2011-11-11 ENCOUNTER — Encounter: Payer: Self-pay | Admitting: Physical Medicine & Rehabilitation

## 2011-11-11 ENCOUNTER — Ambulatory Visit: Payer: Medicare Other | Admitting: Physical Medicine & Rehabilitation

## 2011-11-11 VITALS — BP 168/96 | HR 126 | Resp 16 | Ht 63.0 in | Wt 191.0 lb

## 2011-11-11 DIAGNOSIS — M791 Myalgia, unspecified site: Secondary | ICD-10-CM

## 2011-11-11 DIAGNOSIS — F329 Major depressive disorder, single episode, unspecified: Secondary | ICD-10-CM

## 2011-11-11 DIAGNOSIS — M1710 Unilateral primary osteoarthritis, unspecified knee: Secondary | ICD-10-CM

## 2011-11-11 DIAGNOSIS — IMO0001 Reserved for inherently not codable concepts without codable children: Secondary | ICD-10-CM | POA: Insufficient documentation

## 2011-11-11 DIAGNOSIS — F3289 Other specified depressive episodes: Secondary | ICD-10-CM | POA: Insufficient documentation

## 2011-11-11 DIAGNOSIS — M19019 Primary osteoarthritis, unspecified shoulder: Secondary | ICD-10-CM | POA: Insufficient documentation

## 2011-11-11 DIAGNOSIS — IMO0002 Reserved for concepts with insufficient information to code with codable children: Secondary | ICD-10-CM | POA: Insufficient documentation

## 2011-11-11 DIAGNOSIS — M170 Bilateral primary osteoarthritis of knee: Secondary | ICD-10-CM

## 2011-11-11 MED ORDER — HYDROCODONE-ACETAMINOPHEN 10-325 MG PO TABS: 1.0000 | ORAL_TABLET | Freq: Four times a day (QID) | ORAL | Status: DC | PRN

## 2011-11-11 MED ORDER — METHADONE HCL 10 MG PO TABS: 20.0000 mg | ORAL_TABLET | Freq: Two times a day (BID) | ORAL | Status: DC

## 2011-11-11 NOTE — Patient Instructions (Signed)
You need to increase your knee range of motion and strengthen your quadriceps, hamstrings, and your calf muscles. You need to work on range of motion (stretching) of those muscles as well.

## 2011-11-11 NOTE — Progress Notes (Signed)
  Subjective:    Patient ID: Morgan Wilkerson, female    DOB: October 23, 1955, 56 y.o.   MRN: [id number]  HPI  Pain Inventory Average Pain 10 Pain Right Now 9 My pain is constant, sharp, burning, stabbing, tingling and aching  In the last 24 hours, has pain interfered with the following? General activity 5 Relation with others 5 Enjoyment of life 5 What TIME of day is your pain at its worst? all time Sleep (in general) Fair  Pain is worse with: walking, bending, sitting, inactivity, standing and some activites Pain improves with: rest and heat/ice Relief from Meds: 4  Mobility Do you have any goals in this area?  yes  Function Do you have any goals in this area?  yes  Neuro/Psych weakness numbness tremor tingling trouble walking spasms dizziness confusion depression anxiety  Prior Studies Any changes since last visit?  no  Physicians involved in your care Any changes since last visit?  no   Family History  Problem Relation Age of Onset  . Hypertension Mother   . Aneurysm Maternal Aunt    History   Social History  . Marital Status: Married    Spouse Name: N/A    Number of Children: N/A  . Years of Education: N/A   Social History Main Topics  . Smoking status: Never Smoker   . Smokeless tobacco: Never Used  . Alcohol Use: Yes     Rarely.   . Drug Use: None  . Sexually Active: None   Other Topics Concern  . None   Social History Narrative   Patients phone number is [phone]   Past Surgical History  Procedure Date  . Vaginal hysterectomy 2003    with oophorectomy  . Temporal artery biopsy / ligation 6/09  . Cystoscopy     With hydrolic bladder distension  . Cholecystectomy    Past Medical History  Diagnosis Date  . Panic disorder with agoraphobia   . Hypertension   . History of frostbite     Left breast  . Dysfunctional uterine bleeding     sp hysterectomy  . Right shoulder pain     bone cracked but went into the muscle.  . Left  shoulder pain     torn tendon  . Toe fracture 2001/2002    hx of -ring toe  . Temporal arteritis 5/09    Biopsy demonstrated  . Diabetes mellitus 6/09  . History of acute cystitis 2003    Complicated course post vaginal hysterectomy , required abx bladder instillation x 2 by urology  . Gastritis   . Fibromyalgia   . Bicipital tenosynovitis   . Calcifying tendinitis of shoulder   . Anxiety   . Depression   . Osteoarthrosis, unspecified whether generalized or localized, lower leg    BP 168/96  Pulse 126  Resp 16  Ht 5\' 3"  (1.6 m)  Wt 191 lb (86.637 kg)  BMI 33.83 kg/m2  SpO2 96%  LMP 03/08/2002     Review of Systems  Musculoskeletal: Positive for myalgias, arthralgias and gait problem.  Neurological: Positive for dizziness, weakness and numbness.  Psychiatric/Behavioral: Positive for confusion and dysphoric mood. The patient is nervous/anxious.   All other systems reviewed and are negative.       Objective:   Physical Exam        Assessment & Plan:

## 2011-11-11 NOTE — Progress Notes (Signed)
  Subjective:    Patient ID: Morgan Wilkerson, female    DOB: 1955/09/23, 56 y.o.   MRN: [id number]  HPI Morgan Wilkerson is back regarding her FMS. She states that her pain has increased especially in her knees. She states that she is stretching and trying to do her exercises. Pain is worse when she walks or has to bend. Both knees tend to bother her. We imaged the left knee in May and the xray showed tricompartmental arthritis worst in the lateral compartment.   She also complains of ongoing back pain and diffuse body pain related to her FMS.  Her sleep is fair.   From a mood standpoint her effexor was increased last month by psychiatry. Currently she's taking 150mg  bid.    Review of Systems Knee and back pain. depression .Marland KitchenA 12 point review of systems has been performed and if not noted above is otherwise negative.     Objective:   Physical Exam  GENERAL: The patient is generally alert, although she appears to be in  a bit of malaise which is not too uncommon for her. She appears  depressed.  EXTREMITIES: Gait is wide-based and she uses the crutch to unload the  ankles. She is diffusely tender with palpation from the neck,  shoulders, elbows, hands, trunk, knees and all way down to the feet. No  focal weakness could be appreciated except for some pain inhibition at  the shoulders and ankle. Sensory exam is grossly intact. Reflexes are  1+. Weight is stable. Knees are notable for valgus deformities and atalgia. She has lateral joint line pain bilaterall and crepitus with AROM and PROM. PSYCH: mood is quite flat and depressed     ASSESSMENT:  1. Fibromyalgia syndrome.  2. 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  3. Myofascial shoulder pain.  4. Temporal arteritis.  5. Depression.  6. History of insomnia.  7. Osteoarthritis of the knees  PLAN:  1. I refilled her Norco 10/325 #120 and methadone 10 mg #60.  2. consider resuming an anticonvulsant for her generalized  fibromyalgia pain  3. maintain Effexor at 150 mg b.i.d. I believe she would benefit from psychiatry input.  4. after informed consent and sterile preparation with iodine, I injected both knees via the lateral approach with 40 mg of Kenalog and 3 cc of 1% lidocaine. Patient tolerated well. I advised her that it is up to her to improve her knee strength and range of motion. There is only so much that we can do with injections. She does have arthritis in her knees but I do not believe it is severe. I believe that her mood and overall pain syndrome play a large role here. She needs to take more accountability for her problems.

## 2011-11-15 ENCOUNTER — Encounter (HOSPITAL_COMMUNITY): Payer: Self-pay | Admitting: Emergency Medicine

## 2011-11-15 ENCOUNTER — Emergency Department (HOSPITAL_COMMUNITY): Payer: Medicare Other

## 2011-11-15 ENCOUNTER — Emergency Department (HOSPITAL_COMMUNITY)
Admission: EM | Admit: 2011-11-15 | Discharge: 2011-11-15 | Disposition: A | Discharge status: Home / Self Care | Payer: Medicare Other | Attending: Emergency Medicine | Admitting: Emergency Medicine

## 2011-11-15 DIAGNOSIS — K2970 Gastritis, unspecified, without bleeding: Secondary | ICD-10-CM | POA: Insufficient documentation

## 2011-11-15 DIAGNOSIS — I10 Essential (primary) hypertension: Secondary | ICD-10-CM | POA: Insufficient documentation

## 2011-11-15 DIAGNOSIS — Z9089 Acquired absence of other organs: Secondary | ICD-10-CM | POA: Insufficient documentation

## 2011-11-15 DIAGNOSIS — R10816 Epigastric abdominal tenderness: Secondary | ICD-10-CM | POA: Insufficient documentation

## 2011-11-15 DIAGNOSIS — E119 Type 2 diabetes mellitus without complications: Secondary | ICD-10-CM | POA: Insufficient documentation

## 2011-11-15 DIAGNOSIS — K2990 Gastroduodenitis, unspecified, without bleeding: Secondary | ICD-10-CM | POA: Insufficient documentation

## 2011-11-15 DIAGNOSIS — R109 Unspecified abdominal pain: Secondary | ICD-10-CM | POA: Insufficient documentation

## 2011-11-15 LAB — COMPREHENSIVE METABOLIC PANEL
ALT: 10 U/L (ref 0–35)
Calcium: 9.6 mg/dL (ref 8.4–10.5)
Creatinine, Ser: 0.6 mg/dL (ref 0.50–1.10)
GFR calc Af Amer: >90 mL/min (ref >90)
Glucose, Bld: 226 mg/dL — ABNORMAL HIGH (ref 70–99)
Sodium: 132 mEq/L — ABNORMAL LOW (ref 135–145)
Total Protein: 8.9 g/dL — ABNORMAL HIGH (ref 6.0–8.3)

## 2011-11-15 LAB — URINALYSIS, ROUTINE W REFLEX MICROSCOPIC
Protein, ur: NEGATIVE mg/dL
Urobilinogen, UA: 0.2 mg/dL (ref 0.0–1.0)

## 2011-11-15 LAB — CBC WITH DIFFERENTIAL/PLATELET
Basophils Relative: 0 % (ref 0–1)
HCT: 37.7 % (ref 36.0–46.0)
Hemoglobin: 12.8 g/dL (ref 12.0–15.0)
Lymphocytes Relative: 29 % (ref 12–46)
Lymphs Abs: 5.1 10*3/uL — ABNORMAL HIGH (ref 0.7–4.0)
MCHC: 34 g/dL (ref 30.0–36.0)
Monocytes Absolute: 1.7 10*3/uL — ABNORMAL HIGH (ref 0.1–1.0)
Monocytes Relative: 10 % (ref 3–12)
Neutro Abs: 10.7 10*3/uL — ABNORMAL HIGH (ref 1.7–7.7)
RBC: 4.51 MIL/uL (ref 3.87–5.11)

## 2011-11-15 LAB — URINE MICROSCOPIC-ADD ON

## 2011-11-15 MED ORDER — HYDROCODONE-ACETAMINOPHEN 5-325 MG PO TABS
ORAL_TABLET | ORAL | Status: AC
  Filled 2011-11-15: qty 1

## 2011-11-15 MED ORDER — ONDANSETRON 4 MG PO TBDP: 4.0000 mg | ORAL_TABLET | Freq: Three times a day (TID) | ORAL | Status: AC | PRN

## 2011-11-15 MED ORDER — GI COCKTAIL ~~LOC~~
30.0000 mL | Freq: Once | ORAL | Status: AC
  Administered 2011-11-15: 30 mL via ORAL

## 2011-11-15 MED ORDER — PANTOPRAZOLE SODIUM 40 MG PO TBEC
DELAYED_RELEASE_TABLET | ORAL | Status: AC
  Filled 2011-11-15: qty 1

## 2011-11-15 MED ORDER — HYDROCODONE-ACETAMINOPHEN 5-325 MG PO TABS
1.0000 | ORAL_TABLET | Freq: Once | ORAL | Status: AC
  Administered 2011-11-15: 1 via ORAL

## 2011-11-15 MED ORDER — PANTOPRAZOLE SODIUM 40 MG PO TBEC
40.0000 mg | DELAYED_RELEASE_TABLET | Freq: Once | ORAL | Status: AC
  Administered 2011-11-15: 40 mg via ORAL

## 2011-11-15 MED ORDER — PANTOPRAZOLE SODIUM 40 MG IV SOLR
INTRAVENOUS | Status: AC
  Filled 2011-11-15: qty 40

## 2011-11-15 MED ORDER — GI COCKTAIL ~~LOC~~
ORAL | Status: AC
  Filled 2011-11-15: qty 30

## 2011-11-15 MED ORDER — ONDANSETRON 4 MG PO TBDP
ORAL_TABLET | ORAL | Status: AC
  Filled 2011-11-15: qty 1

## 2011-11-15 MED ORDER — MORPHINE SULFATE 4 MG/ML IJ SOLN
4.0000 mg | Freq: Once | INTRAMUSCULAR | Status: AC
  Administered 2011-11-15: 4 mg via INTRAVENOUS
  Filled 2011-11-15: qty 1

## 2011-11-15 MED ORDER — ONDANSETRON 4 MG PO TBDP
4.0000 mg | ORAL_TABLET | Freq: Once | ORAL | Status: AC
  Administered 2011-11-15: 4 mg via ORAL

## 2011-11-15 MED ORDER — MORPHINE SULFATE 4 MG/ML IJ SOLN
4.0000 mg | Freq: Once | INTRAMUSCULAR | Status: AC
  Administered 2011-11-15: 4 mg via INTRAVENOUS

## 2011-11-15 MED ORDER — SODIUM CHLORIDE 0.9 % IV BOLUS (SEPSIS)
1000.0000 mL | Freq: Once | INTRAVENOUS | Status: AC
  Administered 2011-11-15: 1000 mL via INTRAVENOUS

## 2011-11-15 MED ORDER — PANTOPRAZOLE SODIUM 40 MG IV SOLR
40.0000 mg | Freq: Once | INTRAVENOUS | Status: AC
  Administered 2011-11-15: 40 mg via INTRAVENOUS

## 2011-11-15 MED ORDER — MORPHINE SULFATE 4 MG/ML IJ SOLN
INTRAMUSCULAR | Status: AC
  Filled 2011-11-15: qty 1

## 2011-11-15 MED ORDER — ONDANSETRON HCL 4 MG/2ML IJ SOLN
4.0000 mg | Freq: Once | INTRAMUSCULAR | Status: AC
  Administered 2011-11-15: 4 mg via INTRAVENOUS
  Filled 2011-11-15: qty 2

## 2011-11-15 MED ORDER — PANTOPRAZOLE SODIUM 20 MG PO TBEC: 20.0000 mg | DELAYED_RELEASE_TABLET | Freq: Every day | ORAL | Status: DC

## 2011-11-15 NOTE — ED Notes (Signed)
Reminded Patient we need a urine sample.  Patient stated she needs a little more time.

## 2011-11-15 NOTE — ED Notes (Signed)
Pt c/o lower abd pain with vomiting and chills this weekend; pt sts started this weekend and has not been able to hold down her vicodin or methadone

## 2011-11-15 NOTE — ED Notes (Signed)
Pt reports that she vomitted the water that she was drinking. Pt states that she is dizzy and lightheaded as well.

## 2011-11-15 NOTE — ED Provider Notes (Signed)
History     CSN: [id number]  Arrival date & time 11/15/11  1148   First MD Initiated Contact with Patient 11/15/11 1248      Chief Complaint  Patient presents with  . Abdominal Pain  . Emesis    (Consider location/radiation/quality/duration/timing/severity/associated sxs/prior treatment) HPI Pt is on chronic oral pain medication managed by pain management. Pt started having nausea and epigastric pain and vomited several times since. No vomiting today. Tolerating liquids but has not taken her oral medication. No D/C, urinary symptoms.  Past Medical History  Diagnosis Date  . Panic disorder with agoraphobia   . Hypertension   . History of frostbite     Left breast  . Dysfunctional uterine bleeding     sp hysterectomy  . Right shoulder pain     bone cracked but went into the muscle.  . Left shoulder pain     torn tendon  . Toe fracture 2001/2002    hx of -ring toe  . Temporal arteritis 5/09    Biopsy demonstrated  . Diabetes mellitus 6/09  . History of acute cystitis 2003    Complicated course post vaginal hysterectomy , required abx bladder instillation x 2 by urology  . Gastritis   . Fibromyalgia   . Bicipital tenosynovitis   . Calcifying tendinitis of shoulder   . Anxiety   . Depression   . Osteoarthrosis, unspecified whether generalized or localized, lower leg     Past Surgical History  Procedure Date  . Vaginal hysterectomy 2003    with oophorectomy  . Temporal artery biopsy / ligation 6/09  . Cystoscopy     With hydrolic bladder distension  . Cholecystectomy     Family History  Problem Relation Age of Onset  . Hypertension Mother   . Aneurysm Maternal Aunt     History  Substance Use Topics  . Smoking status: Never Smoker   . Smokeless tobacco: Never Used  . Alcohol Use: Yes     Rarely.     OB History    Grav Para Term Preterm Abortions TAB SAB Ect Mult Living                  Review of Systems  Constitutional: Positive for chills.  Negative for fever.  Respiratory: Negative for shortness of breath.   Cardiovascular: Negative for chest pain.  Gastrointestinal: Positive for nausea, vomiting and abdominal pain. Negative for diarrhea and constipation.  Genitourinary: Negative for dysuria.  Musculoskeletal: Negative for back pain.  Skin: Negative for rash and wound.  Neurological: Negative for dizziness, weakness, numbness and headaches.    Allergies  Ibuprofen; Propoxyphene-acetaminophen; Citric acid; and Oxycodone-acetaminophen  Home Medications   Current Outpatient Rx  Name Route Sig Dispense Refill  . AMITRIPTYLINE HCL 150 MG PO TABS Oral Take 1 tablet (150 mg total) by mouth at bedtime. 30 tablet 3  . CYCLOBENZAPRINE HCL 10 MG PO TABS Oral Take 10 mg by mouth 2 (two) times daily.    Marland Kitchen DICLOFENAC SODIUM 1 % TD GEL Topical Apply 1 application topically 4 (four) times daily. To your knees 3 Tube 4  . HYDROCODONE-ACETAMINOPHEN 10-325 MG PO TABS Oral Take 1 tablet by mouth every 6 (six) hours as needed. For pain    . METFORMIN HCL 1000 MG PO TABS Oral Take 1,000 mg by mouth 2 (two) times daily with a meal.    . METHADONE HCL 10 MG PO TABS Oral Take 2 tablets (20 mg total) by mouth 2 (two)  times daily. 120 tablet 0  . VENLAFAXINE HCL 75 MG PO TABS  Take three tabs in the morning and 2 at night 150 tablet 6  . ONDANSETRON 4 MG PO TBDP Oral Take 1 tablet (4 mg total) by mouth every 8 (eight) hours as needed for nausea. 20 tablet 0  . PANTOPRAZOLE SODIUM 20 MG PO TBEC Oral Take 1 tablet (20 mg total) by mouth daily. 30 tablet 0    BP 147/89  Pulse 80  Temp 98.7 F (37.1 C) (Oral)  Resp 18  SpO2 100%  LMP 03/08/2002  Physical Exam  Nursing note and vitals reviewed. Constitutional: She is oriented to person, place, and time. She appears well-developed and well-nourished. No distress.  HENT:  Head: Normocephalic and atraumatic.  Mouth/Throat: Oropharynx is clear and moist.  Eyes: EOM are normal. Pupils are equal,  round, and reactive to light.  Neck: Normal range of motion. Neck supple.  Cardiovascular: Normal rate and regular rhythm.   Pulmonary/Chest: Effort normal and breath sounds normal. No respiratory distress. She has no wheezes. She has no rales.  Abdominal: Soft. Bowel sounds are normal. She exhibits no distension and no mass. There is tenderness (mild epigastric TTP). There is no rebound and no guarding.  Musculoskeletal: Normal range of motion. She exhibits no edema and no tenderness.  Neurological: She is alert and oriented to person, place, and time.  Skin: Skin is warm and dry. No rash noted. No erythema.  Psychiatric: She has a normal mood and affect. Her behavior is normal.    ED Course  Procedures (including critical care time)  Labs Reviewed  URINALYSIS, ROUTINE W REFLEX MICROSCOPIC - Abnormal; Notable for the following:    APPearance CLOUDY (*)     Glucose, UA 100 (*)     Leukocytes, UA MODERATE (*)     All other components within normal limits  COMPREHENSIVE METABOLIC PANEL - Abnormal; Notable for the following:    Sodium 132 (*)     Glucose, Bld 226 (*)     Total Protein 8.9 (*)     Total Bilirubin 0.2 (*)     All other components within normal limits  CBC WITH DIFFERENTIAL - Abnormal; Notable for the following:    WBC 17.6 (*)     Neutro Abs 10.7 (*)     Lymphs Abs 5.1 (*)     Monocytes Absolute 1.7 (*)     All other components within normal limits  URINE MICROSCOPIC-ADD ON - Abnormal; Notable for the following:    Squamous Epithelial / LPF FEW (*)     All other components within normal limits  LIPASE, BLOOD   Dg Abd Acute W/chest  11/15/2011  *RADIOLOGY REPORT*  Clinical Data: Nausea and vomiting.  Sensation of food stuck in the throat or esophagus over the past 3 days.  ACUTE ABDOMEN SERIES (ABDOMEN 2 VIEW & CHEST 1 VIEW)  Comparison: CT abdomen pelvis 02/03/2009.  Two-view chest x-ray 05/13/2011.  Findings: Bowel gas pattern unremarkable without evidence of  obstruction or significant ileus.  No evidence of free air or significant air fluid levels on the lateral decubitus image. Moderate stool burden.  Surgical clips in the right upper quadrant from prior cholecystectomy.  Phleboliths in the pelvis.  No visible opaque urinary tract calculi.  Regional skeleton intact with only minimal degenerative changes involving the lower lumbar spine.  Suboptimal inspiration accounts for crowded bronchovascular markings, especially in the lung bases, and accentuates the cardiac silhouette.  Taking this into  account, cardiac silhouette normal in size.  Lungs clear.  Mild thoracic aortic atherosclerosis, unchanged.  IMPRESSION:  1.  No acute abdominal abnormality.  Moderate stool burden. 2.  Suboptimal inspiration.  No acute cardiopulmonary disease.  Original Report Authenticated By: Arnell Sieving, M.D.     1. Gastritis       MDM  Suspect gastritis as inciting event and may have some degree of narcotic withdrawal. Will r/o other causes for her abd pain and nausea. Will treat symptomatically and re-evaluate  No acute findings on labs or xrays. Pt is tolerating fluids. D/c home f/u with PMD. Return for persistent vomiting or any concerns      Loren Racer, MD 11/15/11 1541

## 2011-11-17 ENCOUNTER — Ambulatory Visit (INDEPENDENT_AMBULATORY_CARE_PROVIDER_SITE_OTHER): Payer: Medicare Other | Admitting: Internal Medicine

## 2011-11-17 ENCOUNTER — Encounter: Payer: Self-pay | Admitting: Internal Medicine

## 2011-11-17 VITALS — BP 156/91 | HR 93 | Temp 98.0°F | Ht 63.0 in | Wt 180.0 lb

## 2011-11-17 DIAGNOSIS — G894 Chronic pain syndrome: Secondary | ICD-10-CM | POA: Insufficient documentation

## 2011-11-17 DIAGNOSIS — G8929 Other chronic pain: Secondary | ICD-10-CM

## 2011-11-17 DIAGNOSIS — K219 Gastro-esophageal reflux disease without esophagitis: Secondary | ICD-10-CM

## 2011-11-17 DIAGNOSIS — R42 Dizziness and giddiness: Secondary | ICD-10-CM

## 2011-11-17 DIAGNOSIS — R112 Nausea with vomiting, unspecified: Secondary | ICD-10-CM

## 2011-11-17 DIAGNOSIS — E785 Hyperlipidemia, unspecified: Secondary | ICD-10-CM

## 2011-11-17 DIAGNOSIS — Z0000 Encounter for general adult medical examination without abnormal findings: Secondary | ICD-10-CM

## 2011-11-17 DIAGNOSIS — E119 Type 2 diabetes mellitus without complications: Secondary | ICD-10-CM

## 2011-11-17 DIAGNOSIS — F329 Major depressive disorder, single episode, unspecified: Secondary | ICD-10-CM

## 2011-11-17 DIAGNOSIS — K2940 Chronic atrophic gastritis without bleeding: Secondary | ICD-10-CM

## 2011-11-17 DIAGNOSIS — R638 Other symptoms and signs concerning food and fluid intake: Secondary | ICD-10-CM

## 2011-11-17 DIAGNOSIS — R1110 Vomiting, unspecified: Secondary | ICD-10-CM

## 2011-11-17 DIAGNOSIS — E871 Hypo-osmolality and hyponatremia: Secondary | ICD-10-CM

## 2011-11-17 DIAGNOSIS — K2950 Unspecified chronic gastritis without bleeding: Secondary | ICD-10-CM

## 2011-11-17 DIAGNOSIS — R928 Other abnormal and inconclusive findings on diagnostic imaging of breast: Secondary | ICD-10-CM

## 2011-11-17 DIAGNOSIS — I10 Essential (primary) hypertension: Secondary | ICD-10-CM

## 2011-11-17 DIAGNOSIS — R110 Nausea: Secondary | ICD-10-CM

## 2011-11-17 LAB — GLUCOSE, CAPILLARY: Glucose-Capillary: 137 mg/dL — ABNORMAL HIGH (ref 70–99)

## 2011-11-17 MED ORDER — PANTOPRAZOLE SODIUM 20 MG PO TBEC: 20.0000 mg | DELAYED_RELEASE_TABLET | Freq: Every day | ORAL | Status: DC

## 2011-11-17 MED ORDER — CYCLOBENZAPRINE HCL 10 MG PO TABS: 10.0000 mg | ORAL_TABLET | Freq: Two times a day (BID) | ORAL | Status: DC

## 2011-11-17 MED ORDER — PROMETHAZINE HCL 12.5 MG PO TABS: 12.5000 mg | ORAL_TABLET | Freq: Four times a day (QID) | ORAL | Status: DC | PRN

## 2011-11-17 NOTE — Progress Notes (Signed)
Subjective:    Patient ID: Morgan Wilkerson, female    DOB: [birth year], 56 y.o.   MRN: [id number]  HPI Comments: 56 y.o woman with chronic medical hx (controlled DM, panic d/o with agoraphobia, GERD, chronic gastritis, right breast nodule on mammogram, chronic fatigue fibromyalgia syndrome, HLD, myalgia/myositits, OA (knees), chronic pain multiple sites (worsening legs), depression).  Dr. Riley Kill Rx methadone and Norco for pain and pt does not follow with psych anymore (declines f/u)  Pt presents after 2 recent visits to the ED (at [facility] and [facility]) for nausea/vomiting/dehydration/decreased po intake/h/a/dizziness/ab pain. Pt started feeling bad 11/11/11.   AAS was performed 7/9 showing no ileus/kidney stones and moderate amounts of stool.  She also had a CBC with elevated WBC 17.6.  Head imaging was performed at [facility] on 7/10 (awaiting records).  She has only been given Zofran for relief of nausea w/ no relief although she is feeling overall a little bit better today.  See ROS (extensive in ROS section).    SH: Married x 37 years denies physical/sexual/emotional/domestic abuse, 1 daughter, 1 grandchild; pt likes to sew, cook, spend time with family but activities limited d/t health. She is 1/3 siblings (all girls). She is disabled and unable to drive.  She feels safe at home.  She previously worked for the [employer] which she states is why she has muscle damage.       Review of Systems  Constitutional: Positive for activity change, appetite change and fatigue. Negative for fever, chills and diaphoresis.       Subjective fever started 7/5, denies fever today  Decreased po intake (only ate 4 grapes today, milk, little water  Resolved sweating  Denies sick contacts  Denies travel   HENT:       +h/a 7/10 sharp pain crown in scalp, right temple no radiation (pain med on med list not helping at first then some relief)  Denies sore throat  +(double vision, "shadows with vision", seeing spots)      Eyes: Positive for photophobia and visual disturbance.  Respiratory: Positive for shortness of breath. Negative for cough.        +sob yesterday and this am resolved  Cardiovascular: Positive for palpitations. Negative for chest pain and leg swelling.       Resolved palpitations that were present yesterday  Gastrointestinal:       +nausea , vomiting (took Zofran w/o relief and vomit consisted of mucous) Last bowel movement Thursday prior to this week Denies diarrhea  Genitourinary: Positive for frequency. Negative for dysuria, hematuria, vaginal bleeding and vaginal discharge.       Dark urine Denies blood in bowel movement   Musculoskeletal:       Chronic pain (abdomen 7/10 today pain score, neck, back shoulders, legs 7/10 pain score) Denies leg swelling +worsening of chronic leg pain  Neurological: Positive for dizziness, weakness, light-headedness and headaches. Negative for numbness.       Sensation head is spinning Right sided temporal/crown h/a not the worst of her life Denies numbness/tingling  Psychiatric/Behavioral: Positive for dysphoric mood.       Objective:   Physical Exam  Nursing note and vitals reviewed. Constitutional: She is oriented to person, place, and time. She appears well-developed. She is cooperative. She appears ill. No distress.  HENT:  Head: Normocephalic and atraumatic.  Mouth/Throat: Oropharynx is clear and moist and mucous membranes are normal. Abnormal dentition. No oropharyngeal exudate.       Poor dentition with tarter on teeth  Eyes: Conjunctivae are normal. Pupils are equal, round, and reactive to light. Right eye exhibits no discharge. Left eye exhibits no discharge. No scleral icterus.       +photophobia   Cardiovascular: S1 normal, S2 normal and normal heart sounds.  Tachycardia present.  Exam reveals no gallop and no friction rub.   No murmur heard. Pulmonary/Chest: Effort normal and breath sounds normal. No respiratory distress. She has  no wheezes.  Abdominal: Soft. Bowel sounds are normal. She exhibits no distension. There is tenderness.       Stria to ab Scars from previous cholecystectomy and hysterectomy Generalized ttp  Neurological: She is alert and oriented to person, place, and time.       Pt can push manual wheelchair Pt can raise arms above head 5/5 hand grip and arm strength b/l  Skin: Skin is warm, dry and intact. No rash noted. She is not diaphoretic.  Psychiatric: Her speech is normal. She exhibits a depressed mood. She expresses no suicidal ideation.       Depressed mood w/ congruent  Affect  SIGE CAPS assessment: Sleep decreased  Interest decreased Denies guilty feelings  Energy decreased  Concentration decreased  Appetite decreased  Denies SI  +5 criteria           Assessment & Plan:  RTC 1 week sooner if needed  3 mos health maintenance

## 2011-11-17 NOTE — Assessment & Plan Note (Signed)
Pt has PPI Protonix but was not previously taking. Advised to resume

## 2011-11-17 NOTE — Assessment & Plan Note (Signed)
Obtain lipid panel last panel 2012 at goal

## 2011-11-17 NOTE — Patient Instructions (Signed)
New medication Phenergan can be sedating (make you sleepy) along with other meds. Be careful when standing.   Return 1 week, sooner if not feeling better  Return 3 months health maintenance

## 2011-11-17 NOTE — Assessment & Plan Note (Addendum)
Intermittent Assoc with visual changes (see ROS), h/a, lightheadedness Pt had head imaging at [facility] 7/10 will obtain records

## 2011-11-17 NOTE — Assessment & Plan Note (Addendum)
Concerned for dehydration no physical exam findings today  Will try to control nausea with Phenergan hopefully will increase po intake  Po intake encouraged

## 2011-11-17 NOTE — Assessment & Plan Note (Addendum)
2 elevated readings (168/89, 156/91) this visit pt w/ pain and decreased ability to tolerate po (i.e meds) with n/v Will obtain CMP today and follow  Reassess BP at follow up

## 2011-11-17 NOTE — Assessment & Plan Note (Signed)
Pt will need at f/u -foot exam -past due for mammogram needs referral esp with hx  -f/u lipid panel  -will need Tdap and pneumococcal vaccines -pt due for colonoscopy

## 2011-11-17 NOTE — Assessment & Plan Note (Signed)
Pt is past due for repeat mammogram will readdress at health maintenance f/u

## 2011-11-17 NOTE — Assessment & Plan Note (Addendum)
Not responding to Zofran Rx Phenergan 12.5 mg q6 prn #28 no RF Will follow cbc, CMP, UA (will add urine Cx if needed), lipase

## 2011-11-17 NOTE — Assessment & Plan Note (Addendum)
Pain does not seem controlled with Methadone 10 mg bid and Norco 10-325 mg q 6 prn (pt didn't even realize she was taking Norco), pt needed Rx refill Flexeril 10 mg bid  ADLs, IADLs and, leisure activities limited  Pt seeing Dr. Riley Kill pain specialist will forward copy of todays note Possibly if depression can be controlled may help somatic pain complaints vs titration of pain meds

## 2011-11-17 NOTE — Assessment & Plan Note (Addendum)
Controlled HA1C 6.2 Per pt Pt is not taking Metformin, Only Humalog based on FSBS. Humalog not on med list will call [facility] ([address] [phone]) to confirm who is Rx med and which med Spoke with pharmacy Novolin N and R were Rx (base on food intake) 06/16/2011 by Dr. Arvilla Market but pt never picked up Rx  Since DM seems controlled may stop injectables as well in the future  Per Pt has not been taking Humalog or checking FSBS since feeling bad but advised to continue FSBS and injectable Glucose in clinic 137 today  Foot exam by RN needs to be performed at follow up visit for health maintenance Foot exam today by provider w/o sores/ulcers only dystrophic nails and hyperkeratosis

## 2011-11-17 NOTE — Assessment & Plan Note (Signed)
Noted on BMP 11/15/11 Will obtain repeat panel

## 2011-11-17 NOTE — Assessment & Plan Note (Addendum)
Rx PPI Protonix pt not previous taking before todays visit Explained the importance of taking Protonix, pt understands and will resume, given new Rx

## 2011-11-17 NOTE — Assessment & Plan Note (Addendum)
Dysphoric mood with congruent affect d/t limited activities d/t health Pt does not follow with psych and declines tx with psych but appears clinically depressed She is on Effexor 75 mg (3 tabs in the am and 2 tabs in the pm), Amitriptyline 150 mg qhs  Rec f/u with psych but she declines as of now

## 2011-11-18 ENCOUNTER — Telehealth: Payer: Self-pay | Admitting: Internal Medicine

## 2011-11-18 LAB — LIPID PANEL
Cholesterol: 158 mg/dL (ref 0–200)
Total CHOL/HDL Ratio: 3.4 Ratio

## 2011-11-18 LAB — COMPLETE METABOLIC PANEL WITH GFR
AST: 12 U/L (ref 0–37)
Alkaline Phosphatase: 77 U/L (ref 39–117)
BUN: 17 mg/dL (ref 6–23)
Calcium: 9.3 mg/dL (ref 8.4–10.5)
Chloride: 100 mEq/L (ref 96–112)
Creat: 0.7 mg/dL (ref 0.50–1.10)

## 2011-11-18 LAB — PATHOLOGIST SMEAR REVIEW

## 2011-11-18 LAB — CBC WITH DIFFERENTIAL/PLATELET
Basophils Absolute: 0 10*3/uL (ref 0.0–0.1)
Basophils Relative: 0 % (ref 0–1)
Eosinophils Relative: 0 % (ref 0–5)
HCT: 34.4 % — ABNORMAL LOW (ref 36.0–46.0)
MCHC: 34.3 g/dL (ref 30.0–36.0)
MCV: 81.9 fL (ref 78.0–100.0)
Monocytes Absolute: 1 10*3/uL (ref 0.1–1.0)
RDW: 15.1 % (ref 11.5–15.5)

## 2011-11-18 LAB — URINALYSIS, ROUTINE W REFLEX MICROSCOPIC
Glucose, UA: NEGATIVE mg/dL
Leukocytes, UA: NEGATIVE
Nitrite: NEGATIVE
Protein, ur: NEGATIVE mg/dL

## 2011-11-18 NOTE — Telephone Encounter (Signed)
Reviewed labs from 11/17/11. Pt has leukocytosis chronically and recent elevation in WBC ct and neutrophil ct trending down. Neg lipase.  Elevations in WBC can be to chronic emotional stress from pain, inflammatory processes, and the presence or treatment of psychiatric disorders.  UA and lipase negative.  Attempted to call patient with results but no answer. Awaiting records from [facility] visit 11/16/11 of w/u and possible head imaging  [facility] [phone]

## 2011-11-22 ENCOUNTER — Telehealth: Payer: Self-pay | Admitting: Licensed Clinical Social Worker

## 2011-11-22 NOTE — Telephone Encounter (Signed)
Morgan Wilkerson was referred to CSW for referral to home health agency for chronic and acute medical conditions.  CSW placed call to Ms. Crescenzo to determine if pt was homebound and her immediate needs.  SW left message requesting return call. CSW provided contact hours and phone number.

## 2011-11-22 NOTE — Progress Notes (Signed)
I saw, examined, and discussed the patient with Dr Shirlee Latch and agree with the note contained here. Main focus today was ER F/U for N/V. Plain film and blood work non-revealing. Symptomatic treatment and F/U.

## 2011-11-24 NOTE — Addendum Note (Signed)
Addended by: Annett Gula on: 11/24/2011 03:30 PM   Modules accepted: Orders

## 2011-11-24 NOTE — Telephone Encounter (Signed)
CSW placed call to [name].  Discussed referral for home health services.  Ms. [name] states she has not had home health services in the past and does not have a preferred agency.  Pt in agreement for referral to [facility] for [facility] RN for disease management and education.  CSW discussed referral and benefits of [facility], pt in agreement with referral.

## 2011-11-28 NOTE — Telephone Encounter (Signed)
Referral faxed to [facility].  

## 2011-12-06 ENCOUNTER — Telehealth: Payer: Self-pay | Admitting: *Deleted

## 2011-12-06 NOTE — Telephone Encounter (Signed)
Opened in error

## 2011-12-09 ENCOUNTER — Telehealth: Payer: Self-pay | Admitting: *Deleted

## 2011-12-09 NOTE — Telephone Encounter (Signed)
Call from [place], RN with [facility]  # [phone] She is at patients house and doing med rec and does not have a updated med list. Pt is taking Humalog sliding scale, this is not on the list. Also pt states she is taking  BP meds not listed.   You saw the pt on 7/11.  Can you help with this? Is the med list in EPIC correct?  Should pt be using Humalog SS?  Is pt on any BP med?

## 2011-12-12 ENCOUNTER — Ambulatory Visit: Payer: Medicare Other | Admitting: Physical Medicine and Rehabilitation

## 2011-12-13 ENCOUNTER — Telehealth: Payer: Self-pay | Admitting: Internal Medicine

## 2011-12-13 NOTE — Telephone Encounter (Signed)
Spoke with Aurther Loft, RN with [facility] # [phone]  Updated her on my knowledge of current medication list.  Patient is taking per her record 1) Amitryptine 200 mg qhs 2) Colace 100 mg qd 3) HC-Acetaminophen 10-325 one q 6hours prn 4) Methadone 10 mg two pills bid  5) Omeprazole 20 mg qd  6) Tordal 10 mg one pill bid  7) Effexor 75 mg 3 tablets in the am 2 tablets in the pm  Reviewed with RN what patient is taking as far as our list.  Patient is not taking Metformin, Flexeril, Voltaren at home as listed on med list.   At next office visit DM medication needs to be addressed. Patient needs to call for appt for f/u for chronic medical conditions.

## 2011-12-19 NOTE — Telephone Encounter (Signed)
MD called [facility]

## 2012-01-12 ENCOUNTER — Encounter: Payer: Self-pay | Admitting: Physical Medicine and Rehabilitation

## 2012-01-12 ENCOUNTER — Encounter
Payer: Medicare Other | Attending: Physical Medicine and Rehabilitation | Admitting: Physical Medicine and Rehabilitation

## 2012-01-12 VITALS — BP 159/82 | HR 96 | Resp 16 | Ht 63.0 in | Wt 187.0 lb

## 2012-01-12 DIAGNOSIS — G894 Chronic pain syndrome: Secondary | ICD-10-CM

## 2012-01-12 DIAGNOSIS — IMO0001 Reserved for inherently not codable concepts without codable children: Secondary | ICD-10-CM

## 2012-01-12 DIAGNOSIS — M1710 Unilateral primary osteoarthritis, unspecified knee: Secondary | ICD-10-CM | POA: Insufficient documentation

## 2012-01-12 DIAGNOSIS — M549 Dorsalgia, unspecified: Secondary | ICD-10-CM | POA: Insufficient documentation

## 2012-01-12 DIAGNOSIS — M170 Bilateral primary osteoarthritis of knee: Secondary | ICD-10-CM

## 2012-01-12 DIAGNOSIS — M797 Fibromyalgia: Secondary | ICD-10-CM

## 2012-01-12 DIAGNOSIS — F329 Major depressive disorder, single episode, unspecified: Secondary | ICD-10-CM

## 2012-01-12 DIAGNOSIS — M19019 Primary osteoarthritis, unspecified shoulder: Secondary | ICD-10-CM

## 2012-01-12 DIAGNOSIS — M25519 Pain in unspecified shoulder: Secondary | ICD-10-CM | POA: Insufficient documentation

## 2012-01-12 MED ORDER — METHADONE HCL 10 MG PO TABS: 20.0000 mg | ORAL_TABLET | Freq: Two times a day (BID) | ORAL | Status: DC

## 2012-01-12 MED ORDER — HYDROCODONE-ACETAMINOPHEN 10-325 MG PO TABS: 1.0000 | ORAL_TABLET | Freq: Four times a day (QID) | ORAL | Status: DC | PRN

## 2012-01-12 NOTE — Patient Instructions (Signed)
Try to exercise more and be more active.

## 2012-01-12 NOTE — Progress Notes (Signed)
Subjective:    Patient ID: Morgan Wilkerson, female    DOB: [birth year], 56 y.o.   MRN: [id number]  HPI The patient complains about chronic bilateral knee and bilateral shoulder pain. The patient denies any radiation. The patient also complains about some back pain.  The problem has been worse in the last few days, She states, that the Voltaren gel has given her some relief.   Pain Inventory Average Pain 7 Pain Right Now 8 My pain is constant, sharp, burning, stabbing and aching  In the last 24 hours, has pain interfered with the following? General activity 3 Relation with others 1 Enjoyment of life 1 What TIME of day is your pain at its worst? all the time Sleep (in general) Fair  Pain is worse with: walking, bending, sitting, standing and some activites Pain improves with: rest, heat/ice, medication and injections Relief from Meds: 5  Mobility walk with assistance use a walker how many minutes can you walk? 15-30 ability to climb steps?  yes do you drive?  no Do you have any goals in this area?  yes  Function disabled: date disabled 2003 I need assistance with the following:  bathing, toileting, meal prep, household duties and shopping Do you have any goals in this area?  yes  Neuro/Psych weakness numbness trouble walking spasms dizziness confusion depression anxiety  Prior Studies Any changes since last visit?  no  Physicians involved in your care Any changes since last visit?  no   Family History  Problem Relation Age of Onset  . Hypertension Mother   . Aneurysm Maternal Aunt   . Stroke Other    History   Social History  . Marital Status: Married    Spouse Name: N/A    Number of Children: N/A  . Years of Education: N/A   Social History Main Topics  . Smoking status: Never Smoker   . Smokeless tobacco: Never Used  . Alcohol Use: No     Rarely.   . Drug Use: No  . Sexually Active: None   Other Topics Concern  . None   Social History  Narrative   Patients phone number is [phone]   Past Surgical History  Procedure Date  . Vaginal hysterectomy 2003    with oophorectomy  . Temporal artery biopsy / ligation 6/09  . Cystoscopy     With hydrolic bladder distension  . Cholecystectomy    Past Medical History  Diagnosis Date  . Panic disorder with agoraphobia   . Hypertension   . History of frostbite     Left breast  . Dysfunctional uterine bleeding     sp hysterectomy  . Right shoulder pain     bone cracked but went into the muscle.  . Left shoulder pain     torn tendon  . Toe fracture 2001/2002    hx of -ring toe  . Temporal arteritis 5/09    Biopsy demonstrated  . Diabetes mellitus 6/09  . History of acute cystitis 2003    Complicated course post vaginal hysterectomy , required abx bladder instillation x 2 by urology  . Gastritis   . Fibromyalgia   . Bicipital tenosynovitis   . Calcifying tendinitis of shoulder   . Anxiety   . Depression   . Osteoarthrosis, unspecified whether generalized or localized, lower leg    BP 159/82  Pulse 96  Resp 16  Ht 5\' 3"  (1.6 m)  Wt 187 lb (84.823 kg)  BMI 33.13 kg/m2  SpO2 98%  LMP 03/08/2002     Review of Systems  Musculoskeletal: Positive for myalgias, arthralgias and gait problem.  Neurological: Positive for dizziness, weakness and numbness.  Psychiatric/Behavioral: Positive for confusion and dysphoric mood. The patient is nervous/anxious.   All other systems reviewed and are negative.       Objective:   Physical Exam  Constitutional: She is oriented to person, place, and time. She appears well-developed and well-nourished.  HENT:  Head: Normocephalic.  Neck: Neck supple.  Musculoskeletal: She exhibits tenderness.  Neurological: She is alert and oriented to person, place, and time.  Skin: Skin is warm and dry.  Psychiatric: She has a normal mood and affect.     Symmetric normal motor tone is noted throughout. Normal muscle bulk. Muscle  testing reveals 5/5 muscle strength of the upper extremity, and 5/5 of the lower extremity. Full range of motion in upper and lower extremities, except deficit in knee extension of 5-10 degrees bilateral, because of pain, and mild deficit in abduction with external rotation of about 10 degrees in shoulders bilateral, also because of pain. ROM of spine is restricted.  DTR in the upper and lower extremity are present and symmetric 2+. No clonus is noted.  Patient arises from chair with difficulty. Wide based gait with crutches.        Assessment & Plan:  This is a 56 year old female with  1. Fibromyalgia  2. 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 Shoulder pain  4. Back pain  Plan :   Patient had x-rays of her knees done since the last visit. The x-rays showed mild arthritis in the lateral compartment of her knees. Advised the patient to exercise preferable in a pool, patient states, that she has no transportation during the week, and did not really wanted to go during the weekend. Explained to the patient that she has just mild degenerative changes, and that strengthening the muscles around the knee, would help with her pain and take stress off her knees. Patient does not really want to do exercises. Recommended a referral to a pain psychologist to learn coping strategies for her pain , patient denied this too.She should take more accountability/ initiative for her deconditioned muscles and resulting pain. Refilled methadone 10mg , the dosage prescribed to her was 20 mg twice a day.  Follow up in 1 month.

## 2012-02-07 ENCOUNTER — Encounter
Payer: Medicare Other | Attending: Physical Medicine and Rehabilitation | Admitting: Physical Medicine and Rehabilitation

## 2012-02-17 ENCOUNTER — Telehealth: Payer: Self-pay | Admitting: Physical Medicine & Rehabilitation

## 2012-02-17 NOTE — Telephone Encounter (Signed)
Lm advising patient to contact office with what medications she needs or to contact her pharmacy for a refill request.

## 2012-02-17 NOTE — Telephone Encounter (Signed)
OUt of meds.  Missed 10.1.13 appt.  Will see KP on 10.18.13

## 2012-02-22 ENCOUNTER — Telehealth: Payer: Self-pay | Admitting: Physical Medicine & Rehabilitation

## 2012-02-22 NOTE — Telephone Encounter (Signed)
Missed her appointment and needs meds

## 2012-02-22 NOTE — Telephone Encounter (Signed)
Pt needs to wait until her appointment on 02/24/12 to get her medication. This way she won't miss another appointment.   I tried calling her but her mailbox was full.

## 2012-02-24 ENCOUNTER — Encounter: Payer: Self-pay | Admitting: Physical Medicine and Rehabilitation

## 2012-02-24 ENCOUNTER — Encounter
Payer: Medicare Other | Attending: Physical Medicine and Rehabilitation | Admitting: Physical Medicine and Rehabilitation

## 2012-02-24 VITALS — BP 156/99 | HR 132 | Resp 14 | Ht 63.0 in | Wt 187.0 lb

## 2012-02-24 DIAGNOSIS — M170 Bilateral primary osteoarthritis of knee: Secondary | ICD-10-CM

## 2012-02-24 DIAGNOSIS — IMO0001 Reserved for inherently not codable concepts without codable children: Secondary | ICD-10-CM | POA: Insufficient documentation

## 2012-02-24 DIAGNOSIS — M25519 Pain in unspecified shoulder: Secondary | ICD-10-CM | POA: Insufficient documentation

## 2012-02-24 DIAGNOSIS — F329 Major depressive disorder, single episode, unspecified: Secondary | ICD-10-CM

## 2012-02-24 DIAGNOSIS — M19019 Primary osteoarthritis, unspecified shoulder: Secondary | ICD-10-CM

## 2012-02-24 DIAGNOSIS — M1710 Unilateral primary osteoarthritis, unspecified knee: Secondary | ICD-10-CM | POA: Insufficient documentation

## 2012-02-24 DIAGNOSIS — M549 Dorsalgia, unspecified: Secondary | ICD-10-CM | POA: Insufficient documentation

## 2012-02-24 MED ORDER — HYDROCODONE-ACETAMINOPHEN 10-325 MG PO TABS: 1.0000 | ORAL_TABLET | Freq: Four times a day (QID) | ORAL | Status: DC | PRN

## 2012-02-24 MED ORDER — METHADONE HCL 10 MG PO TABS: 20.0000 mg | ORAL_TABLET | Freq: Two times a day (BID) | ORAL | Status: DC

## 2012-02-24 MED ORDER — DICLOFENAC SODIUM 1 % TD GEL: 1.0000 "application " | Freq: Four times a day (QID) | TRANSDERMAL | Status: DC

## 2012-02-24 NOTE — Progress Notes (Signed)
Subjective:    Patient ID: Morgan Wilkerson, female    DOB: 03-16-1956, 56 y.o.   MRN: [id number]  HPI The patient complains about chronic bilateral knee and bilateral shoulder pain. The patient denies any radiation. The patient also complains about some back pain.  The problem has been worse in the last few days, She states, that the Voltaren gel has given her some relief.   Pain Inventory Average Pain 3 Pain Right Now 3 My pain is intermittent, constant, sharp, burning, dull, stabbing, tingling and aching  In the last 24 hours, has pain interfered with the following? General activity 2 Relation with others 2 Enjoyment of life 2 What TIME of day is your pain at its worst? night Sleep (in general) Poor  Pain is worse with: walking, bending, sitting, standing and some activites Pain improves with: rest, pacing activities and medication Relief from Meds: 8  Mobility walk with assistance use a cane how many minutes can you walk? 5 ability to climb steps?  yes do you drive?  no  Function disabled: date disabled 2009  Neuro/Psych bladder control problems weakness numbness trouble walking  Prior Studies Any changes since last visit?  no  Physicians involved in your care Any changes since last visit?  no   Family History  Problem Relation Age of Onset  . Hypertension Mother   . Aneurysm Maternal Aunt   . Stroke Other    History   Social History  . Marital Status: Married    Spouse Name: N/A    Number of Children: N/A  . Years of Education: N/A   Social History Main Topics  . Smoking status: Never Smoker   . Smokeless tobacco: Never Used  . Alcohol Use: No     Rarely.   . Drug Use: No  . Sexually Active: None   Other Topics Concern  . None   Social History Narrative   Patients phone number is [phone]   Past Surgical History  Procedure Date  . Vaginal hysterectomy 2003    with oophorectomy  . Temporal artery biopsy / ligation 6/09  . Cystoscopy      With hydrolic bladder distension  . Cholecystectomy    Past Medical History  Diagnosis Date  . Panic disorder with agoraphobia   . Hypertension   . History of frostbite     Left breast  . Dysfunctional uterine bleeding     sp hysterectomy  . Right shoulder pain     bone cracked but went into the muscle.  . Left shoulder pain     torn tendon  . Toe fracture 2001/2002    hx of -ring toe  . Temporal arteritis 5/09    Biopsy demonstrated  . Diabetes mellitus 6/09  . History of acute cystitis 2003    Complicated course post vaginal hysterectomy , required abx bladder instillation x 2 by urology  . Gastritis   . Fibromyalgia   . Bicipital tenosynovitis   . Calcifying tendinitis of shoulder   . Anxiety   . Depression   . Osteoarthrosis, unspecified whether generalized or localized, lower leg    BP 156/99  Pulse 132  Resp 14  Ht 5\' 3"  (1.6 m)  Wt 187 lb (84.823 kg)  BMI 33.13 kg/m2  SpO2 96%  LMP 03/08/2002     Review of Systems  Musculoskeletal: Positive for arthralgias and gait problem.  Neurological: Positive for weakness and numbness.  All other systems reviewed and are negative.  Objective:   Physical Exam Constitutional: She is oriented to person, place, and time. She appears well-developed and well-nourished.  HENT:  Head: Normocephalic.  Neck: Neck supple.  Musculoskeletal: She exhibits tenderness.  Neurological: She is alert and oriented to person, place, and time.  Skin: Skin is warm and dry.  Psychiatric: She has a normal mood and affect.   Symmetric normal motor tone is noted throughout. Normal muscle bulk. Muscle testing reveals 5/5 muscle strength of the upper extremity, and 5/5 of the lower extremity. Full range of motion in upper and lower extremities, except deficit in knee extension of 5-10 degrees bilateral, because of pain, and mild deficit in abduction with external rotation of about 10 degrees in shoulders bilateral, also because of  pain. ROM of spine is restricted.  DTR in the upper and lower extremity are present and symmetric 2+. No clonus is noted.  Patient arises from chair with difficulty. Wide based gait with crutches.         Assessment & Plan:  This is a 56 year old female with  1. Fibromyalgia  2. 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 Shoulder pain  4. Back pain  Plan :  Patient had x-rays of her knees done since the last visit. The x-rays showed mild arthritis in the lateral compartment of her knees. Advised the patient to exercise preferable in a pool, patient states, that she has no transportation during the week, and did not really wanted to go during the weekend. Explained to the patient that she has just mild degenerative changes, and that strengthening the muscles around the knee, would help with her pain and take stress off her knees. Patient does not really want to do exercises. I also offered to order PT or aquatic PT, the patient declined. Recommended a referral to a pain psychologist to learn coping strategies for her pain , patient denied this too.She should take more accountability/ initiative for her deconditioned muscles and resulting pain. Refilled methadone 10mg , the dosage prescribed to her was 20 mg twice a day.  Follow up in 1 month.

## 2012-02-24 NOTE — Patient Instructions (Signed)
Advised patient to do some stretching and strengthening exercises.

## 2012-03-22 ENCOUNTER — Encounter
Payer: Medicare Other | Attending: Physical Medicine and Rehabilitation | Admitting: Physical Medicine and Rehabilitation

## 2012-03-27 ENCOUNTER — Encounter: Payer: Self-pay | Admitting: Physical Medicine and Rehabilitation

## 2012-03-27 ENCOUNTER — Encounter
Payer: Medicare Other | Attending: Physical Medicine and Rehabilitation | Admitting: Physical Medicine and Rehabilitation

## 2012-03-27 VITALS — BP 142/86 | HR 105 | Resp 14 | Ht 63.0 in | Wt 195.0 lb

## 2012-03-27 DIAGNOSIS — M170 Bilateral primary osteoarthritis of knee: Secondary | ICD-10-CM

## 2012-03-27 DIAGNOSIS — I10 Essential (primary) hypertension: Secondary | ICD-10-CM | POA: Insufficient documentation

## 2012-03-27 DIAGNOSIS — F329 Major depressive disorder, single episode, unspecified: Secondary | ICD-10-CM

## 2012-03-27 DIAGNOSIS — M19019 Primary osteoarthritis, unspecified shoulder: Secondary | ICD-10-CM

## 2012-03-27 DIAGNOSIS — M549 Dorsalgia, unspecified: Secondary | ICD-10-CM | POA: Insufficient documentation

## 2012-03-27 DIAGNOSIS — M25569 Pain in unspecified knee: Secondary | ICD-10-CM | POA: Insufficient documentation

## 2012-03-27 DIAGNOSIS — M25519 Pain in unspecified shoulder: Secondary | ICD-10-CM | POA: Insufficient documentation

## 2012-03-27 DIAGNOSIS — E119 Type 2 diabetes mellitus without complications: Secondary | ICD-10-CM | POA: Insufficient documentation

## 2012-03-27 DIAGNOSIS — G8929 Other chronic pain: Secondary | ICD-10-CM | POA: Insufficient documentation

## 2012-03-27 DIAGNOSIS — M1710 Unilateral primary osteoarthritis, unspecified knee: Secondary | ICD-10-CM | POA: Insufficient documentation

## 2012-03-27 DIAGNOSIS — IMO0001 Reserved for inherently not codable concepts without codable children: Secondary | ICD-10-CM | POA: Insufficient documentation

## 2012-03-27 MED ORDER — METHOCARBAMOL 500 MG PO TABS: 500.0000 mg | ORAL_TABLET | Freq: Three times a day (TID) | ORAL | Status: DC

## 2012-03-27 MED ORDER — HYDROCODONE-ACETAMINOPHEN 10-325 MG PO TABS: 1.0000 | ORAL_TABLET | Freq: Four times a day (QID) | ORAL | Status: DC | PRN

## 2012-03-27 MED ORDER — METHADONE HCL 10 MG PO TABS: 20.0000 mg | ORAL_TABLET | Freq: Two times a day (BID) | ORAL | Status: DC

## 2012-03-27 NOTE — Patient Instructions (Addendum)
Try to stay as active as tolerated, I would recommended aquatic exercising.

## 2012-03-27 NOTE — Progress Notes (Signed)
Subjective:    Patient ID: Morgan Wilkerson, female    DOB: [birth year], 56 y.o.   MRN: [id number]  HPI The patient complains about chronic bilateral knee and bilateral shoulder pain. The patient denies any radiation. The patient also complains about some back pain.  The problem has been stable. She states, that the Voltaren gel has given her some relief. She reports that the flexeril upsets her stomach.   Pain Inventory Average Pain 10 Pain Right Now 10 My pain is intermittent, constant, sharp, burning, stabbing, tingling and aching  In the last 24 hours, has pain interfered with the following? General activity 10 Relation with others 10 Enjoyment of life 10 What TIME of day is your pain at its worst? all the time Sleep (in general) Poor  Pain is worse with: walking, bending, sitting, inactivity and standing Pain improves with: injections Relief from Meds: 5  Mobility walk with assistance how many minutes can you walk? 15-20 ability to climb steps?  no do you drive?  no use a wheelchair Do you have any goals in this area?  yes  Function disabled: date disabled 2008 I need assistance with the following:  dressing, bathing, toileting, meal prep, household duties and shopping Do you have any goals in this area?  yes  Neuro/Psych bladder control problems weakness numbness tremor tingling trouble walking spasms dizziness confusion depression anxiety  Prior Studies Any changes since last visit?  no  Physicians involved in your care Any changes since last visit?  no   Family History  Problem Relation Age of Onset  . Hypertension Mother   . Aneurysm Maternal Aunt   . Stroke Other    History   Social History  . Marital Status: Married    Spouse Name: N/A    Number of Children: N/A  . Years of Education: N/A   Social History Main Topics  . Smoking status: Never Smoker   . Smokeless tobacco: Never Used  . Alcohol Use: No     Comment: Rarely.   . Drug Use:  No  . Sexually Active: None   Other Topics Concern  . None   Social History Narrative   Patients phone number is [phone]   Past Surgical History  Procedure Date  . Vaginal hysterectomy 2003    with oophorectomy  . Temporal artery biopsy / ligation 6/09  . Cystoscopy     With hydrolic bladder distension  . Cholecystectomy    Past Medical History  Diagnosis Date  . Panic disorder with agoraphobia   . Hypertension   . History of frostbite     Left breast  . Dysfunctional uterine bleeding     sp hysterectomy  . Right shoulder pain     bone cracked but went into the muscle.  . Left shoulder pain     torn tendon  . Toe fracture 2001/2002    hx of -ring toe  . Temporal arteritis 5/09    Biopsy demonstrated  . Diabetes mellitus 6/09  . History of acute cystitis 2003    Complicated course post vaginal hysterectomy , required abx bladder instillation x 2 by urology  . Gastritis   . Fibromyalgia   . Bicipital tenosynovitis   . Calcifying tendinitis of shoulder   . Anxiety   . Depression   . Osteoarthrosis, unspecified whether generalized or localized, lower leg    BP 142/86  Pulse 105  Resp 14  Ht 5\' 3"  (1.6 m)  Wt 195 lb (88.451 kg)  BMI 34.54 kg/m2  SpO2 99%  LMP 03/08/2002     Review of Systems  Musculoskeletal: Positive for myalgias, arthralgias and gait problem.  Neurological: Positive for dizziness, weakness and numbness.  Psychiatric/Behavioral: Positive for confusion and dysphoric mood. The patient is nervous/anxious.   All other systems reviewed and are negative.       Objective:   Physical Exam Constitutional: She is oriented to person, place, and time. She appears well-developed and well-nourished.  HENT:  Head: Normocephalic.  Neck: Neck supple.  Musculoskeletal: She exhibits tenderness.  Neurological: She is alert and oriented to person, place, and time.  Skin: Skin is warm and dry.  Psychiatric: She has a normal mood and affect.    Symmetric normal motor tone is noted throughout. Normal muscle bulk. Muscle testing reveals 5/5 muscle strength of the upper extremity, and 5/5 of the lower extremity. Full range of motion in upper and lower extremities, except deficit in knee extension of 5-10 degrees bilateral, because of pain, and mild deficit in abduction with external rotation of about 10 degrees in shoulders bilateral, also because of pain. ROM of spine is restricted.  DTR in the upper and lower extremity are present and symmetric 2+. No clonus is noted.  Patient arises from chair with difficulty. Wide based gait with crutches.         Assessment & Plan:  This is a 56 year old female with  1. Fibromyalgia  2. 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 Shoulder pain  4. Back pain  Plan :  Patient had x-rays of her knees done since the last visit. The x-rays showed mild arthritis in the lateral compartment of her knees. Advised the patient to exercise preferable in a pool, patient states, that she has no transportation during the week, and did not really wanted to go during the weekend. Explained to the patient that she has just mild degenerative changes, and that strengthening the muscles around the knee, would help with her pain and take stress off her knees. Patient does not really want to do exercises. I also offered to order PT or aquatic PT, the patient declined. Recommended a referral to a pain psychologist to learn coping strategies for her pain , patient denied this too.She should take more accountability/ initiative for her deconditioned muscles and resulting pain. Refilled methadone 10mg , the dosage prescribed to her was 20 mg twice a day. Prescribed Robaxin 500mg  tid , instead of the flexeril, which upsets her stomach. Follow up in 1 month.

## 2012-04-18 ENCOUNTER — Ambulatory Visit: Payer: Medicare Other | Admitting: Physical Medicine and Rehabilitation

## 2012-04-25 ENCOUNTER — Encounter: Payer: Self-pay | Admitting: Physical Medicine and Rehabilitation

## 2012-04-25 ENCOUNTER — Encounter
Payer: Medicare Other | Attending: Physical Medicine and Rehabilitation | Admitting: Physical Medicine and Rehabilitation

## 2012-04-25 VITALS — BP 162/90 | HR 112 | Resp 16 | Ht 63.0 in | Wt 178.0 lb

## 2012-04-25 DIAGNOSIS — F329 Major depressive disorder, single episode, unspecified: Secondary | ICD-10-CM

## 2012-04-25 DIAGNOSIS — M25569 Pain in unspecified knee: Secondary | ICD-10-CM

## 2012-04-25 DIAGNOSIS — M19019 Primary osteoarthritis, unspecified shoulder: Secondary | ICD-10-CM

## 2012-04-25 DIAGNOSIS — M1710 Unilateral primary osteoarthritis, unspecified knee: Secondary | ICD-10-CM | POA: Insufficient documentation

## 2012-04-25 DIAGNOSIS — Z5181 Encounter for therapeutic drug level monitoring: Secondary | ICD-10-CM

## 2012-04-25 DIAGNOSIS — F32A Depression, unspecified: Secondary | ICD-10-CM

## 2012-04-25 DIAGNOSIS — IMO0001 Reserved for inherently not codable concepts without codable children: Secondary | ICD-10-CM

## 2012-04-25 DIAGNOSIS — M170 Bilateral primary osteoarthritis of knee: Secondary | ICD-10-CM

## 2012-04-25 DIAGNOSIS — M25519 Pain in unspecified shoulder: Secondary | ICD-10-CM | POA: Insufficient documentation

## 2012-04-25 DIAGNOSIS — M549 Dorsalgia, unspecified: Secondary | ICD-10-CM | POA: Insufficient documentation

## 2012-04-25 MED ORDER — HYDROCODONE-ACETAMINOPHEN 10-325 MG PO TABS: 1.0000 | ORAL_TABLET | Freq: Four times a day (QID) | ORAL | Status: DC | PRN

## 2012-04-25 MED ORDER — METHADONE HCL 10 MG PO TABS: 20.0000 mg | ORAL_TABLET | Freq: Two times a day (BID) | ORAL | Status: DC

## 2012-04-25 NOTE — Patient Instructions (Signed)
Try to be as active as tolerated, try to do some exercises in a sitting position with your knees.

## 2012-04-25 NOTE — Progress Notes (Signed)
Subjective:    Patient ID: Morgan Wilkerson, female    DOB: February 21, 1956, 56 y.o.   MRN: [id number]  HPI The patient complains about chronic bilateral knee and bilateral shoulder pain. The patient denies any radiation. The patient also complains about some back pain.  The problem has been stable. She states, that the Voltaren gel has given her some relief. Patient is feeling a little sick, she states, that her husband had the stomach flu, and she thinks she might have it now.   Pain Inventory Average Pain 10 Pain Right Now 8 My pain is constant, sharp, burning, dull, stabbing, tingling and aching  In the last 24 hours, has pain interfered with the following? General activity 8 Relation with others 8 Enjoyment of life 9 What TIME of day is your pain at its worst? all the time Sleep (in general) Fair  Pain is worse with: walking, bending, sitting, standing and some activites Pain improves with: medication Relief from Meds: 6  Mobility walk with assistance how many minutes can you walk? 5 ability to climb steps?  no do you drive?  no Do you have any goals in this area?  yes  Function disabled: date disabled 2003 I need assistance with the following:  meal prep, household duties and shopping  Neuro/Psych weakness numbness tremor tingling trouble walking spasms dizziness confusion depression anxiety  Prior Studies Any changes since last visit?  no  Physicians involved in your care Any changes since last visit?  no   Family History  Problem Relation Age of Onset  . Hypertension Mother   . Aneurysm Maternal Aunt   . Stroke Other    History   Social History  . Marital Status: Married    Spouse Name: N/A    Number of Children: N/A  . Years of Education: N/A   Social History Main Topics  . Smoking status: Never Smoker   . Smokeless tobacco: Never Used  . Alcohol Use: No     Comment: Rarely.   . Drug Use: No  . Sexually Active: None   Other Topics Concern   . None   Social History Narrative   Patients phone number is [phone]   Past Surgical History  Procedure Date  . Vaginal hysterectomy 2003    with oophorectomy  . Temporal artery biopsy / ligation 6/09  . Cystoscopy     With hydrolic bladder distension  . Cholecystectomy    Past Medical History  Diagnosis Date  . Panic disorder with agoraphobia   . Hypertension   . History of frostbite     Left breast  . Dysfunctional uterine bleeding     sp hysterectomy  . Right shoulder pain     bone cracked but went into the muscle.  . Left shoulder pain     torn tendon  . Toe fracture 2001/2002    hx of -ring toe  . Temporal arteritis 5/09    Biopsy demonstrated  . Diabetes mellitus 6/09  . History of acute cystitis 2003    Complicated course post vaginal hysterectomy , required abx bladder instillation x 2 by urology  . Gastritis   . Fibromyalgia   . Bicipital tenosynovitis   . Calcifying tendinitis of shoulder   . Anxiety   . Depression   . Osteoarthrosis, unspecified whether generalized or localized, lower leg    BP 162/90  Pulse 112  Resp 16  Ht 5\' 3"  (1.6 m)  Wt 178 lb (80.74 kg)  BMI 31.53  kg/m2  SpO2 99%  LMP 03/08/2002    Review of Systems  Constitutional: Positive for chills.  Gastrointestinal: Positive for constipation.  Musculoskeletal: Positive for myalgias, arthralgias and gait problem.  Neurological: Positive for dizziness, tremors, weakness and numbness.       Tingling and spasms  Psychiatric/Behavioral: Positive for confusion and dysphoric mood. The patient is nervous/anxious.   All other systems reviewed and are negative.       Objective:   Physical Exam Constitutional: She is oriented to person, place, and time. She appears well-developed and well-nourished.  HENT:  Head: Normocephalic.  Neck: Neck supple.  Musculoskeletal: She exhibits tenderness.  Neurological: She is alert and oriented to person, place, and time.  Skin: Skin is  warm and dry.  Psychiatric: She has a normal mood and affect.  Symmetric normal motor tone is noted throughout. Normal muscle bulk. Muscle testing reveals 5/5 muscle strength of the upper extremity, and 5/5 of the lower extremity. Full range of motion in upper and lower extremities, except deficit in knee extension of 5-10 degrees bilateral, because of pain, and mild deficit in abduction with external rotation of about 10 degrees in shoulders bilateral, also because of pain. ROM of spine is restricted.  DTR in the upper and lower extremity are present and symmetric 2+. No clonus is noted.  Patient arises from chair with difficulty. Wide based gait with crutches.         Assessment & Plan:  This is a 56 year old female with  1. Fibromyalgia  2. 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 Shoulder pain  4. Back pain  Plan :  Patient had x-rays of her knees done since the last visit. The x-rays showed mild arthritis in the lateral compartment of her knees. Advised the patient to exercise preferable in a pool, patient states, that she has no transportation during the week, and did not really wanted to go during the weekend. Explained to the patient that she has just mild degenerative changes, and that strengthening the muscles around the knee, would help with her pain and take stress off her knees. Patient does not really want to do exercises. I also offered to order PT or aquatic PT, the patient declined. Recommended a referral to a pain psychologist to learn coping strategies for her pain , patient denied this too.She should take more accountability/ initiative for her deconditioned muscles and resulting pain. Refilled methadone 10mg , the dosage prescribed to her was 20 mg twice a day, and Hydrocodone 10mg  4x/day. Prescribed Robaxin 500mg  tid , instead of the flexeril, which upsets her stomach, patient has not picked up this medication because of financial issues.  Follow up in 1 month.

## 2012-04-25 NOTE — Progress Notes (Signed)
Today Morgan Wilkerson was requested to give a random urine sample for a drug screen. She says that she attempted to give one, "but I missed the urine collection device".  I explained to Morgan Wilkerson at check out that she would need to give a sample within 24 hours and our policy is to not give the narcotic prescriptions until after the sample is obtained. She was insistent that she could not give a sample in her condition and would "try" to come back tomorrow.  I informed her that if she left today with the prescription, (which she had already received), and did not return tomorrow, she would be discharged from the practice as per our controlled substance agreement that she signed.  Our practice manager Morgan Wilkerson approached her and requested we hold the prescription until her return. Morgan Wilkerson gave [place] back  the prescriptions for hydrocodone and methadone until she returns to give the urine sample tomorrow.

## 2012-05-04 ENCOUNTER — Encounter: Payer: Self-pay | Admitting: Internal Medicine

## 2012-05-04 ENCOUNTER — Ambulatory Visit (INDEPENDENT_AMBULATORY_CARE_PROVIDER_SITE_OTHER): Payer: Medicare Other | Admitting: Internal Medicine

## 2012-05-04 VITALS — BP 142/82 | HR 102 | Temp 97.7°F | Ht 63.5 in | Wt 188.2 lb

## 2012-05-04 DIAGNOSIS — Z79899 Other long term (current) drug therapy: Secondary | ICD-10-CM

## 2012-05-04 DIAGNOSIS — M1710 Unilateral primary osteoarthritis, unspecified knee: Secondary | ICD-10-CM

## 2012-05-04 DIAGNOSIS — I10 Essential (primary) hypertension: Secondary | ICD-10-CM

## 2012-05-04 DIAGNOSIS — R112 Nausea with vomiting, unspecified: Secondary | ICD-10-CM

## 2012-05-04 DIAGNOSIS — K219 Gastro-esophageal reflux disease without esophagitis: Secondary | ICD-10-CM

## 2012-05-04 DIAGNOSIS — R110 Nausea: Secondary | ICD-10-CM

## 2012-05-04 DIAGNOSIS — E119 Type 2 diabetes mellitus without complications: Secondary | ICD-10-CM

## 2012-05-04 DIAGNOSIS — IMO0002 Reserved for concepts with insufficient information to code with codable children: Secondary | ICD-10-CM

## 2012-05-04 DIAGNOSIS — R928 Other abnormal and inconclusive findings on diagnostic imaging of breast: Secondary | ICD-10-CM

## 2012-05-04 DIAGNOSIS — M170 Bilateral primary osteoarthritis of knee: Secondary | ICD-10-CM

## 2012-05-04 LAB — CBC WITH DIFFERENTIAL/PLATELET
Basophils Absolute: 0 10*3/uL (ref 0.0–0.1)
Basophils Relative: 0 % (ref 0–1)
Eosinophils Absolute: 0.2 10*3/uL (ref 0.0–0.7)
Eosinophils Relative: 2 % (ref 0–5)
Lymphocytes Relative: 54 % — ABNORMAL HIGH (ref 12–46)
MCH: 26.9 pg (ref 26.0–34.0)
MCHC: 32.3 g/dL (ref 30.0–36.0)
MCV: 83.2 fL (ref 78.0–100.0)
Platelets: 225 10*3/uL (ref 150–400)
RDW: 15 % (ref 11.5–15.5)
WBC: 7.6 10*3/uL (ref 4.0–10.5)

## 2012-05-04 LAB — POCT GLYCOSYLATED HEMOGLOBIN (HGB A1C): Hemoglobin A1C: 6.5

## 2012-05-04 LAB — GLUCOSE, CAPILLARY: Glucose-Capillary: 157 mg/dL — ABNORMAL HIGH (ref 70–99)

## 2012-05-04 MED ORDER — METFORMIN HCL 500 MG PO TABS: 500.0000 mg | ORAL_TABLET | Freq: Every day | ORAL | Status: DC

## 2012-05-04 MED ORDER — PROMETHAZINE HCL 12.5 MG RE SUPP: 12.5000 mg | Freq: Four times a day (QID) | RECTAL | Status: DC | PRN

## 2012-05-04 MED ORDER — PANTOPRAZOLE SODIUM 40 MG PO TBEC
40.0000 mg | DELAYED_RELEASE_TABLET | Freq: Every day | ORAL | Status: DC
Start: 2012-05-04 — End: 2012-07-06

## 2012-05-04 NOTE — Progress Notes (Signed)
Subjective:   [name]   DOB: [birth year] 56 y.o.   MRN: [id number]  HPI: 56 year old woman with past medical history significant for type 2 diabetes mellitus, hypertension, gastritis, osteoarthritis follows with pain clinic comes to the clinic for a followup visit.  Patient reports having nausea and vomiting for last 2 weeks. She states that she had few episodes of vomiting  last week but none after that , but she continues to feel nauseus with today's visit. Her vomitus is nonbilious and nonbloody. She notices increased burping and burning sensation in her epigastrium for last one week. She states that somebody told her that she has weak stomach lining but she took Protonix only for a month. She was requesting if she can try that again. Denies sick contacts per se. She states that her daughter was sick with similar symptoms last week but she doesn't stay with her and she got better in 3 days. She reports having chronic diffuse ( mostly lower) abdominal pain ever since she injured herself in an accident in 24s. She thinks that she might be having low-grade temps because her lips have been unusually dry but she never took her temperature at home.  She continues to complain of severe pain in her both knees radiating to her legs. She takes vicodin and methadone for her pain and follows with pain clinic. She has tried exercises in the past but that does not help, instead aggravate her pain.    Past Medical History  Diagnosis Date  . Panic disorder with agoraphobia   . Hypertension   . History of frostbite     Left breast  . Dysfunctional uterine bleeding     sp hysterectomy  . Right shoulder pain     bone cracked but went into the muscle.  . Left shoulder pain     torn tendon  . Toe fracture 2001/2002    hx of -ring toe  . Temporal arteritis 5/09    Biopsy demonstrated  . Diabetes mellitus 6/09  . History of acute cystitis 2003    Complicated course post vaginal  hysterectomy , required abx bladder instillation x 2 by urology  . Gastritis   . Fibromyalgia   . Bicipital tenosynovitis   . Calcifying tendinitis of shoulder   . Anxiety   . Depression   . Osteoarthrosis, unspecified whether generalized or localized, lower leg    Family History  Problem Relation Age of Onset  . Hypertension Mother   . Aneurysm Maternal Aunt   . Stroke Other    History   Social History  . Marital Status: Married    Spouse Name: N/A    Number of Children: N/A  . Years of Education: N/A   Occupational History  . Not on file.   Social History Main Topics  . Smoking status: Never Smoker   . Smokeless tobacco: Never Used  . Alcohol Use: No     Comment: Rarely.   . Drug Use: No  . Sexually Active: Not on file   Other Topics Concern  . Not on file   Social History Narrative   Patients phone number is [phone]   Review of Systems: General: Denies fever, chills, diaphoresis, appetite change and fatigue. HEENT: Denies photophobia, eye pain, redness, hearing loss, ear pain, congestion, sore throat, rhinorrhea, sneezing, mouth sores, trouble swallowing, neck pain, neck stiffness and tinnitus. Respiratory: Denies SOB, DOE, cough, chest tightness, and wheezing. Cardiovascular: Denies to chest pain, palpitations and leg  swelling. Gastrointestinal: Denies , vomiting, , diarrhea, constipation, blood in stool and abdominal distention, + nausea, + abdominal pain ( chronic complaint). Genitourinary: Denies dysuria, urgency, frequency, hematuria, flank pain and difficulty urinating. Musculoskeletal: Denies myalgias, back pain, joint swelling, + arthralgias and gait problem.  Skin: Denies pallor, rash and wound. Neurological: Denies dizziness, seizures, syncope, weakness, light-headedness, numbness and headaches. Hematological: Denies adenopathy, easy bruising, personal or family bleeding history. Psychiatric/Behavioral: Denies suicidal ideation, mood changes,  confusion, nervousness, sleep disturbance and agitation.    Current Outpatient Medications: Current Outpatient Prescriptions  Medication Sig Dispense Refill  . amitriptyline (ELAVIL) 150 MG tablet Take 1 tablet (150 mg total) by mouth at bedtime.  30 tablet  3  . diclofenac sodium (VOLTAREN) 1 % GEL Apply 1 application topically 4 (four) times daily. To your knees  3 Tube  4  . HYDROcodone-acetaminophen (NORCO) 10-325 MG per tablet Take 1 tablet by mouth every 6 (six) hours as needed. For pain  120 tablet  0  . methadone (DOLOPHINE) 10 MG tablet Take 2 tablets (20 mg total) by mouth 2 (two) times daily.  120 tablet  0  . methocarbamol (ROBAXIN) 500 MG tablet Take 1 tablet (500 mg total) by mouth 3 (three) times daily.  90 tablet  1  . venlafaxine (EFFEXOR) 75 MG tablet Take three tabs in the morning and 2 at night  150 tablet  6    Allergies: Allergies  Allergen Reactions  . Ibuprofen Itching  . Propoxyphene-Acetaminophen Itching  . Citric Acid Itching and Rash  . Oxycodone-Acetaminophen Rash      Objective:   Physical Exam: Filed Vitals:   05/04/12 1332  BP: 150/86  Pulse: 120  Temp: 97.7 F (36.5 C)    General: Vital signs reviewed and noted. Well-developed, well-nourished, in no acute distress; alert, appropriate and cooperative throughout examination. Walks with crutches Head: Normocephalic, atraumatic Lungs: Normal respiratory effort. Clear to auscultation BL without crackles or wheezes. Heart: RRR. S1 and S2 normal without gallop, murmur, or rubs. Abdomen:BS normoactive. Soft, Nondistended, non-tender.  No masses or organomegaly. Breast: No lump or mass were felt Extremities: No pretibial edema.     Assessment & Plan:

## 2012-05-04 NOTE — Patient Instructions (Addendum)
General Instructions: Please schedule a follow up appointment in 1 month . Please bring your medication bottles with your next appointment. Please take your medicines as prescribed. I will call you with your lab results if anything will be abnormal. Please call us if your nausea, vomiting persists or gets worse in 1 week    Treatment Goals:  Goals (1 Years of Data) as of 05/04/2012          As of Today 04/25/12 03/27/12 02/24/12 01/12/12     Blood Pressure    . Blood Pressure < 140/90  150/86 162/90 142/86 156/99 159/82     Result Component    . HEMOGLOBIN A1C < 7.0  6.5        . LDL CALC < 100            Progress Toward Treatment Goals:  Treatment Goal 05/04/2012  Hemoglobin A1C at goal  Blood pressure unchanged    Self Care Goals & Plans:  Self Care Goal 05/04/2012  Manage my medications take my medicines as prescribed; bring my medications to every visit  Monitor my health keep track of my blood glucose; bring my glucose meter and log to each visit  Eat healthy foods drink diet soda or water instead of juice or soda; eat more vegetables; eat foods that are low in salt       Care Management & Community Referrals:  Referral 05/04/2012  Referrals made for care management support none needed

## 2012-05-05 LAB — COMPLETE METABOLIC PANEL WITH GFR
ALT: 20 U/L (ref 0–35)
AST: 28 U/L (ref 0–37)
Alkaline Phosphatase: 75 U/L (ref 39–117)
BUN: 10 mg/dL (ref 6–23)
Creat: 0.66 mg/dL (ref 0.50–1.10)
Total Bilirubin: 0.3 mg/dL (ref 0.3–1.2)

## 2012-05-07 MED ORDER — PROMETHAZINE HCL 12.5 MG PO TABS: 12.5000 mg | ORAL_TABLET | Freq: Four times a day (QID) | ORAL | Status: DC | PRN

## 2012-05-07 MED ORDER — METOPROLOL SUCCINATE ER 25 MG PO TB24: 25.0000 mg | ORAL_TABLET | Freq: Every day | ORAL | Status: DC

## 2012-05-07 NOTE — Assessment & Plan Note (Signed)
Patient follows up with the pain clinic. She has been recommended and offered options like PT, aquatic PT to strengthen the muscles around her knees but she refused. We also had the conversation along the same lines but she declined again. Would continue to counsel her with future visits.

## 2012-05-07 NOTE — Assessment & Plan Note (Signed)
Etiology unclear. DD includes viral illness vs side effects from pain medications vs gastritis vs PUD. Patient had similar complaint with her last clinic visit  but she states that she was fine in the interim until she was taking her protonix. My suspicion is high towards viral illness vs side effects from the pain medication. - Check CBC, CMP - Symptomatic treatment with phenergan as needed - Would also give her refills on protonix to help with gastritis if that is the cause of her symptoms.

## 2012-05-07 NOTE — Assessment & Plan Note (Signed)
We are working in getting her an appointment with the breast center for diagnostic mammogram. No abnormal mass or nodule was felt on exam  today. Patient states that she would keep up with the appt.

## 2012-05-07 NOTE — Assessment & Plan Note (Addendum)
Patient is in lot of distress from pain in the knees and legs. She was walking with the crutches and had to be transferred to wheel chair because of pain. Her BP is moderately elevated because of pain. Patient was suppose to be on Toprol XL ( she also has tachycardia) but that medication was d/c and taken off from her list given her non - compliance. Reviewing her trend, I would restart her back on that medication. Patient was informed about the same. Medication was sent electronically to the pharmacy.   BP Readings from Last 3 Encounters:  05/04/12 150/86  04/25/12 162/90  03/27/12 142/86

## 2012-05-07 NOTE — Assessment & Plan Note (Signed)
Her AIC was 6.5 with today's visit. Patient is very restricted in her activities. She states that she is not able to exercise because it increases her pain. We had a lengthy conversation about the importance of exercising as it would help with her DM and also in turn is a treatment for her OA. Patient does not seem to be motivated at this time. Given her lifestyle, I would start her back on metformin to help with her DM.  - Start her on once daily 500 mg of metformin. She was advised to take that with her meals.

## 2012-05-25 ENCOUNTER — Ambulatory Visit: Payer: Medicare Other | Admitting: Physical Medicine and Rehabilitation

## 2012-05-28 ENCOUNTER — Ambulatory Visit: Payer: Medicare Other | Admitting: Physical Medicine and Rehabilitation

## 2012-05-30 ENCOUNTER — Encounter: Payer: Medicare Other | Admitting: Physical Medicine and Rehabilitation

## 2012-06-08 ENCOUNTER — Ambulatory Visit: Payer: Medicare Other | Admitting: Internal Medicine

## 2012-06-11 ENCOUNTER — Encounter: Payer: Self-pay | Admitting: Physical Medicine and Rehabilitation

## 2012-06-11 ENCOUNTER — Encounter
Payer: Medicare Other | Attending: Physical Medicine and Rehabilitation | Admitting: Physical Medicine and Rehabilitation

## 2012-06-11 VITALS — BP 141/82 | HR 106 | Resp 14 | Ht 63.0 in | Wt 188.0 lb

## 2012-06-11 DIAGNOSIS — IMO0001 Reserved for inherently not codable concepts without codable children: Secondary | ICD-10-CM | POA: Insufficient documentation

## 2012-06-11 DIAGNOSIS — M1710 Unilateral primary osteoarthritis, unspecified knee: Secondary | ICD-10-CM | POA: Insufficient documentation

## 2012-06-11 DIAGNOSIS — M549 Dorsalgia, unspecified: Secondary | ICD-10-CM | POA: Insufficient documentation

## 2012-06-11 DIAGNOSIS — M25569 Pain in unspecified knee: Secondary | ICD-10-CM | POA: Insufficient documentation

## 2012-06-11 DIAGNOSIS — E119 Type 2 diabetes mellitus without complications: Secondary | ICD-10-CM | POA: Insufficient documentation

## 2012-06-11 DIAGNOSIS — M170 Bilateral primary osteoarthritis of knee: Secondary | ICD-10-CM

## 2012-06-11 DIAGNOSIS — M25519 Pain in unspecified shoulder: Secondary | ICD-10-CM | POA: Insufficient documentation

## 2012-06-11 DIAGNOSIS — F329 Major depressive disorder, single episode, unspecified: Secondary | ICD-10-CM

## 2012-06-11 DIAGNOSIS — I10 Essential (primary) hypertension: Secondary | ICD-10-CM | POA: Insufficient documentation

## 2012-06-11 DIAGNOSIS — M19019 Primary osteoarthritis, unspecified shoulder: Secondary | ICD-10-CM

## 2012-06-11 MED ORDER — HYDROCODONE-ACETAMINOPHEN 10-325 MG PO TABS: 1.0000 | ORAL_TABLET | Freq: Four times a day (QID) | ORAL | Status: DC | PRN

## 2012-06-11 MED ORDER — METHADONE HCL 10 MG PO TABS: 20.0000 mg | ORAL_TABLET | Freq: Two times a day (BID) | ORAL | Status: DC

## 2012-06-11 NOTE — Progress Notes (Signed)
Subjective:    Patient ID: Morgan Wilkerson, female    DOB: 12-29-1955, 57 y.o.   MRN: [id number]  HPI The patient complains about chronic bilateral knee and bilateral shoulder pain. The patient denies any radiation. The patient also complains about some back pain. The main problem is her knee pain. The problem has gotten a little worse. She states, that the Voltaren gel has given her some relief.  Pain Inventory Average Pain 9 Pain Right Now 10 My pain is constant, sharp and stabbing  In the last 24 hours, has pain interfered with the following? General activity 10 Relation with others 10 Enjoyment of life 10 What TIME of day is your pain at its worst? all the time Sleep (in general) Fair  Pain is worse with: walking, bending, sitting, inactivity, standing and some activites Pain improves with: rest and medication Relief from Meds: 6  Mobility use a cane how many minutes can you walk? little ability to climb steps?  no do you drive?  no Do you have any goals in this area?  yes  Function disabled: date disabled 2003 I need assistance with the following:  dressing, bathing, toileting, meal prep, household duties and shopping Do you have any goals in this area?  no  Neuro/Psych weakness numbness tremor trouble walking spasms dizziness confusion depression anxiety  Prior Studies Any changes since last visit?  no  Physicians involved in your care Any changes since last visit?  no   Family History  Problem Relation Age of Onset  . Hypertension Mother   . Aneurysm Maternal Aunt   . Stroke Other    History   Social History  . Marital Status: Married    Spouse Name: N/A    Number of Children: N/A  . Years of Education: N/A   Social History Main Topics  . Smoking status: Never Smoker   . Smokeless tobacco: Never Used  . Alcohol Use: No     Comment: Rarely.   . Drug Use: No  . Sexually Active: None   Other Topics Concern  . None   Social History  Narrative   Patients phone number is [phone]   Past Surgical History  Procedure Date  . Vaginal hysterectomy 2003    with oophorectomy  . Temporal artery biopsy / ligation 6/09  . Cystoscopy     With hydrolic bladder distension  . Cholecystectomy    Past Medical History  Diagnosis Date  . Panic disorder with agoraphobia   . Hypertension   . History of frostbite     Left breast  . Dysfunctional uterine bleeding     sp hysterectomy  . Right shoulder pain     bone cracked but went into the muscle.  . Left shoulder pain     torn tendon  . Toe fracture 2001/2002    hx of -ring toe  . Temporal arteritis 5/09    Biopsy demonstrated  . Diabetes mellitus 6/09  . History of acute cystitis 2003    Complicated course post vaginal hysterectomy , required abx bladder instillation x 2 by urology  . Gastritis   . Fibromyalgia   . Bicipital tenosynovitis   . Calcifying tendinitis of shoulder   . Anxiety   . Depression   . Osteoarthrosis, unspecified whether generalized or localized, lower leg    BP 141/82  Pulse 106  Resp 14  Ht 5\' 3"  (1.6 m)  Wt 188 lb (85.276 kg)  BMI 33.30 kg/m2  SpO2 98%  LMP 03/08/2002   2  Review of Systems  Musculoskeletal: Positive for gait problem.  Neurological: Positive for dizziness, weakness and numbness.  Psychiatric/Behavioral: Positive for confusion and dysphoric mood. The patient is nervous/anxious.   All other systems reviewed and are negative.       Objective:   Physical Exam Constitutional: She is oriented to person, place, and time. She appears well-developed and well-nourished.  HENT:  Head: Normocephalic.  Neck: Neck supple.  Musculoskeletal: She exhibits tenderness.  Neurological: She is alert and oriented to person, place, and time.  Skin: Skin is warm and dry.  Psychiatric: She has a normal mood and affect.  Symmetric normal motor tone is noted throughout. Normal muscle bulk. Muscle testing reveals 5/5 muscle  strength of the upper extremity, and 5/5 of the lower extremity. Full range of motion in upper and lower extremities, except deficit in knee extension of 5-10 degrees bilateral, because of pain, and mild deficit in abduction with external rotation of about 10 degrees in shoulders bilateral, also because of pain. ROM of spine is restricted.  DTR in the upper and lower extremity are present and symmetric 2+. No clonus is noted.  Patient arises from chair with difficulty. Wide based gait with crutches.         Assessment & Plan:  This is a 57 year old female with  1. Fibromyalgia  2. 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 3. Shoulder pain  4. Back pain  Plan :  Patient had x-rays of her knees done since the last visit. The x-rays showed mild arthritis in the lateral compartment of her knees. Advised the patient to exercise preferable in a pool, patient states, that she has no transportation during the week, and did not really wanted to go during the weekend. Explained to the patient that she has just mild degenerative changes, and that strengthening the muscles around the knee, would help with her pain and take stress off her knees. Patient does not really want to do exercises. I also offered to order PT or aquatic PT, the patient declined. Recommended a referral to a pain psychologist to learn coping strategies for her pain , patient denied this too.She should take more accountability/ initiative for her deconditioned muscles and resulting pain. Refilled methadone 10mg , the dosage prescribed to her was 20 mg twice a day, and Hydrocodone 10mg  4x/day. Prescribed Robaxin 500mg  tid , instead of the flexeril, which upsets her stomach, patient has not picked  up this medication because of financial issues. Refilled he methadone, and hydrocodone.  Follow up in 1 month.

## 2012-06-11 NOTE — Patient Instructions (Signed)
Try to be more active, do some strengthening exercises for your legs, look into aquatic exercises.

## 2012-06-19 ENCOUNTER — Telehealth: Payer: Self-pay

## 2012-06-19 NOTE — Telephone Encounter (Signed)
Patient returning call.

## 2012-07-06 ENCOUNTER — Encounter: Payer: Self-pay | Admitting: Physical Medicine and Rehabilitation

## 2012-07-06 ENCOUNTER — Encounter
Payer: Medicare Other | Attending: Physical Medicine and Rehabilitation | Admitting: Physical Medicine and Rehabilitation

## 2012-07-06 VITALS — BP 138/80 | HR 111 | Resp 14 | Ht 63.0 in | Wt 188.0 lb

## 2012-07-06 DIAGNOSIS — IMO0001 Reserved for inherently not codable concepts without codable children: Secondary | ICD-10-CM | POA: Insufficient documentation

## 2012-07-06 DIAGNOSIS — F329 Major depressive disorder, single episode, unspecified: Secondary | ICD-10-CM

## 2012-07-06 DIAGNOSIS — E119 Type 2 diabetes mellitus without complications: Secondary | ICD-10-CM | POA: Insufficient documentation

## 2012-07-06 DIAGNOSIS — M1710 Unilateral primary osteoarthritis, unspecified knee: Secondary | ICD-10-CM | POA: Insufficient documentation

## 2012-07-06 DIAGNOSIS — I10 Essential (primary) hypertension: Secondary | ICD-10-CM | POA: Insufficient documentation

## 2012-07-06 DIAGNOSIS — M25519 Pain in unspecified shoulder: Secondary | ICD-10-CM | POA: Insufficient documentation

## 2012-07-06 DIAGNOSIS — G894 Chronic pain syndrome: Secondary | ICD-10-CM

## 2012-07-06 DIAGNOSIS — M25569 Pain in unspecified knee: Secondary | ICD-10-CM | POA: Insufficient documentation

## 2012-07-06 DIAGNOSIS — M549 Dorsalgia, unspecified: Secondary | ICD-10-CM | POA: Insufficient documentation

## 2012-07-06 DIAGNOSIS — F3289 Other specified depressive episodes: Secondary | ICD-10-CM | POA: Insufficient documentation

## 2012-07-06 MED ORDER — HYDROCODONE-ACETAMINOPHEN 10-325 MG PO TABS: 1.0000 | ORAL_TABLET | Freq: Four times a day (QID) | ORAL | Status: DC | PRN

## 2012-07-06 MED ORDER — METHADONE HCL 10 MG PO TABS: 20.0000 mg | ORAL_TABLET | Freq: Two times a day (BID) | ORAL | Status: DC

## 2012-07-06 NOTE — Progress Notes (Signed)
Subjective:    Patient ID: Morgan Wilkerson, female    DOB: 1955-12-19, 57 y.o.   MRN: [id number]  HPI The patient complains about chronic bilateral knee and bilateral shoulder pain. The patient denies any radiation. The patient also complains about some back pain. The main problem is her knee pain.  The problem has gotten a little worse. She states, that the Voltaren gel has given her some relief. The patient feels depressed today, she states, that she run out of her Effexor, and has not filled it yet.  Pain Inventory Average Pain 8 Pain Right Now 8 My pain is intermittent, sharp, burning, dull, stabbing, tingling and aching  In the last 24 hours, has pain interfered with the following? General activity 8 Relation with others 9 Enjoyment of life 10 What TIME of day is your pain at its worst? morning Sleep (in general) Fair  Pain is worse with: walking, standing and some activites Pain improves with: rest, pacing activities and medication Relief from Meds: 8  Mobility use a walker how many minutes can you walk? 3-5 ability to climb steps?  yes do you drive?  no Do you have any goals in this area?  yes  Function disabled: date disabled 2009 Do you have any goals in this area?  no  Neuro/Psych weakness numbness tremor tingling trouble walking spasms dizziness confusion depression anxiety loss of taste or smell suicidal thoughts-no plan  Prior Studies Any changes since last visit?  no  Physicians involved in your care Any changes since last visit?  no   Family History  Problem Relation Age of Onset  . Hypertension Mother   . Aneurysm Maternal Aunt   . Stroke Other    History   Social History  . Marital Status: Married    Spouse Name: N/A    Number of Children: N/A  . Years of Education: N/A   Social History Main Topics  . Smoking status: Never Smoker   . Smokeless tobacco: Never Used  . Alcohol Use: No     Comment: Rarely.   . Drug Use: No  .  Sexually Active: None   Other Topics Concern  . None   Social History Narrative   Patients phone number is [phone]   Past Surgical History  Procedure Laterality Date  . Vaginal hysterectomy  2003    with oophorectomy  . Temporal artery biopsy / ligation  6/09  . Cystoscopy      With hydrolic bladder distension  . Cholecystectomy     Past Medical History  Diagnosis Date  . Panic disorder with agoraphobia   . Hypertension   . History of frostbite     Left breast  . Dysfunctional uterine bleeding     sp hysterectomy  . Right shoulder pain     bone cracked but went into the muscle.  . Left shoulder pain     torn tendon  . Toe fracture 2001/2002    hx of -ring toe  . Temporal arteritis 5/09    Biopsy demonstrated  . Diabetes mellitus 6/09  . History of acute cystitis 2003    Complicated course post vaginal hysterectomy , required abx bladder instillation x 2 by urology  . Gastritis   . Fibromyalgia   . Bicipital tenosynovitis   . Calcifying tendinitis of shoulder   . Anxiety   . Depression   . Osteoarthrosis, unspecified whether generalized or localized, lower leg    BP 138/80  Pulse 111  Resp 14  Ht 5\' 3"  (1.6 m)  Wt 188 lb (85.276 kg)  BMI 33.31 kg/m2  SpO2 99%  LMP 03/08/2002     Review of Systems  Musculoskeletal: Positive for gait problem.  Neurological: Positive for dizziness, tremors, weakness and numbness.  Psychiatric/Behavioral: Positive for confusion and dysphoric mood. The patient is nervous/anxious.   All other systems reviewed and are negative.       Objective:   Physical Exam Constitutional: She is oriented to person, place, and time. She appears well-developed and well-nourished.  HENT:  Head: Normocephalic.  Neck: Neck supple.  Musculoskeletal: She exhibits tenderness.  Neurological: She is alert and oriented to person, place, and time.  Skin: Skin is warm and dry.  Psychiatric: She has a depressed mood today, but no suicidal  thoughts.  Symmetric normal motor tone is noted throughout. Normal muscle bulk. Muscle testing reveals 5/5 muscle strength of the upper extremity, and 5/5 of the lower extremity. Full range of motion in upper and lower extremities, except deficit in knee extension of 5-10 degrees bilateral, because of pain, and mild deficit in abduction with external rotation of about 10 degrees in shoulders bilateral, also because of pain. ROM of spine is restricted.  DTR in the upper and lower extremity are present and symmetric 2+. No clonus is noted.  Patient arises from chair with difficulty. Wide based gait with crutches.         Assessment & Plan:  This is a 57 year old female with  1. Fibromyalgia  2. 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 Patient has not followed up with ortho yet. 3. Shoulder pain  4. Back pain  5. Depression, patient ran out of her Effexor, and is in a depressed mood today, she states that she does not have any suicidal thoughts.  I advised her to get her Effexor filled asap, also that she should follow up with her PCP for her depression, and that she should go to the ED if her depression gets worse, or she develops thoughts to harm herself. Plan :  Patient had x-rays of her knees done since the last visit. The x-rays showed mild arthritis in the lateral compartment of her knees. Advised the patient to exercise preferable in a pool, patient states, that she has no transportation during the week, and did not really wanted to go during the weekend. Explained to the patient that she has just mild degenerative changes, and that strengthening the muscles around the knee, would help with her pain and take stress off her knees. Patient does not  really want to do exercises. I also offered to order PT or aquatic PT, the patient declined. Recommended a referral to a pain psychologist to learn coping strategies for her pain , patient denied this too.She should take more accountability/ initiative for her deconditioned muscles and resulting pain. Refilled methadone 10mg , the dosage prescribed to her was 20 mg twice a day, and Hydrocodone 10mg  4x/day. Prescribed Robaxin 500mg  tid , instead of the flexeril, which upsets her stomach, patient has not picked up this medication because of financial issues.  Refilled he methadone, and hydrocodone.  Follow up in 1 month.

## 2012-07-06 NOTE — Patient Instructions (Addendum)
Please see your PCP in the outpatient clinic for your depression, or go to the ED if your depression gets worse. Get your effexor filled today or tomorrow.

## 2012-08-02 ENCOUNTER — Encounter: Payer: Medicare Other | Admitting: Physical Medicine and Rehabilitation

## 2012-08-06 ENCOUNTER — Encounter
Payer: Medicare Other | Attending: Physical Medicine and Rehabilitation | Admitting: Physical Medicine and Rehabilitation

## 2012-08-06 ENCOUNTER — Encounter: Payer: Self-pay | Admitting: Physical Medicine and Rehabilitation

## 2012-08-06 VITALS — BP 160/69 | HR 119 | Resp 14 | Ht 63.0 in | Wt 188.0 lb

## 2012-08-06 DIAGNOSIS — IMO0001 Reserved for inherently not codable concepts without codable children: Secondary | ICD-10-CM

## 2012-08-06 DIAGNOSIS — IMO0002 Reserved for concepts with insufficient information to code with codable children: Secondary | ICD-10-CM | POA: Insufficient documentation

## 2012-08-06 DIAGNOSIS — F329 Major depressive disorder, single episode, unspecified: Secondary | ICD-10-CM | POA: Insufficient documentation

## 2012-08-06 DIAGNOSIS — M1710 Unilateral primary osteoarthritis, unspecified knee: Secondary | ICD-10-CM

## 2012-08-06 DIAGNOSIS — M170 Bilateral primary osteoarthritis of knee: Secondary | ICD-10-CM

## 2012-08-06 DIAGNOSIS — F3289 Other specified depressive episodes: Secondary | ICD-10-CM | POA: Insufficient documentation

## 2012-08-06 DIAGNOSIS — G8929 Other chronic pain: Secondary | ICD-10-CM

## 2012-08-06 DIAGNOSIS — M25569 Pain in unspecified knee: Secondary | ICD-10-CM

## 2012-08-06 MED ORDER — METHADONE HCL 10 MG PO TABS: 20.0000 mg | ORAL_TABLET | Freq: Two times a day (BID) | ORAL | Status: DC

## 2012-08-06 MED ORDER — HYDROCODONE-ACETAMINOPHEN 10-325 MG PO TABS: 1.0000 | ORAL_TABLET | Freq: Four times a day (QID) | ORAL | Status: DC | PRN

## 2012-08-06 NOTE — Patient Instructions (Signed)
Follow up with your PCP for a refill of your blood pressure medication.

## 2012-08-06 NOTE — Progress Notes (Signed)
Subjective:    Patient ID: Morgan Wilkerson, female    DOB: Sep 09, 1955, 57 y.o.   MRN: [id number]  HPI The patient complains about chronic bilateral knee and bilateral shoulder pain. The patient denies any radiation. The patient also complains about some back pain. The main problem is her knee pain.  The problem has been stable. She states, that the Voltaren gel has given her some relief. The patient is in a good mood today, she states, that she is back on her effexor.   Pain Inventory Average Pain 7 Pain Right Now 9 My pain is intermittent, constant, sharp, burning, stabbing and aching  In the last 24 hours, has pain interfered with the following? General activity 8 Relation with others 9 Enjoyment of life 10 What TIME of day is your pain at its worst? all the time Sleep (in general) Fair  Pain is worse with: walking, bending, sitting, inactivity, standing and some activites Pain improves with: rest, heat/ice, medication and injections Relief from Meds: 8  Mobility walk with assistance how many minutes can you walk? 10 ability to climb steps?  no do you drive?  no Do you have any goals in this area?  yes  Function disabled: date disabled processing I need assistance with the following:  dressing, bathing, meal prep, household duties and shopping Do you have any goals in this area?  yes  Neuro/Psych bladder control problems weakness numbness tremor tingling trouble walking spasms dizziness confusion depression anxiety  Prior Studies Any changes since last visit?  no  Physicians involved in your care Any changes since last visit?  no   Family History  Problem Relation Age of Onset  . Hypertension Mother   . Aneurysm Maternal Aunt   . Stroke Other    History   Social History  . Marital Status: Married    Spouse Name: N/A    Number of Children: N/A  . Years of Education: N/A   Social History Main Topics  . Smoking status: Never Smoker   . Smokeless  tobacco: Never Used  . Alcohol Use: No     Comment: Rarely.   . Drug Use: No  . Sexually Active: None   Other Topics Concern  . None   Social History Narrative   Patients phone number is [phone]   Past Surgical History  Procedure Laterality Date  . Vaginal hysterectomy  2003    with oophorectomy  . Temporal artery biopsy / ligation  6/09  . Cystoscopy      With hydrolic bladder distension  . Cholecystectomy     Past Medical History  Diagnosis Date  . Panic disorder with agoraphobia   . Hypertension   . History of frostbite     Left breast  . Dysfunctional uterine bleeding     sp hysterectomy  . Right shoulder pain     bone cracked but went into the muscle.  . Left shoulder pain     torn tendon  . Toe fracture 2001/2002    hx of -ring toe  . Temporal arteritis 5/09    Biopsy demonstrated  . Diabetes mellitus 6/09  . History of acute cystitis 2003    Complicated course post vaginal hysterectomy , required abx bladder instillation x 2 by urology  . Gastritis   . Fibromyalgia   . Bicipital tenosynovitis   . Calcifying tendinitis of shoulder   . Anxiety   . Depression   . Osteoarthrosis, unspecified whether generalized or localized, lower leg  BP 160/69  Pulse 119  Resp 14  Ht 5\' 3"  (1.6 m)  Wt 188 lb (85.276 kg)  BMI 33.31 kg/m2  SpO2 94%  LMP 03/08/2002     Review of Systems  Genitourinary: Positive for difficulty urinating.  Musculoskeletal: Positive for myalgias, arthralgias and gait problem.  Neurological: Positive for dizziness, tremors, weakness and numbness.  Psychiatric/Behavioral: Positive for confusion and dysphoric mood. The patient is nervous/anxious.   All other systems reviewed and are negative.       Objective:   Physical Exam Constitutional: She is oriented to person, place, and time. She appears well-developed and well-nourished.  HENT:  Head: Normocephalic.  Neck: Neck supple.  Musculoskeletal: She exhibits tenderness.   Neurological: She is alert and oriented to person, place, and time.  Skin: Skin is warm and dry.  Psychiatric: She is in a good mood today.  Symmetric normal motor tone is noted throughout. Normal muscle bulk. Muscle testing reveals 5/5 muscle strength of the upper extremity, and 5/5 of the lower extremity. Full range of motion in upper and lower extremities, except deficit in knee extension of 5-10 degrees bilateral, because of pain, and mild deficit in abduction with external rotation of about 10 degrees in shoulders bilateral, also because of pain. ROM of spine is restricted.  DTR in the upper and lower extremity are present and symmetric 2+. No clonus is noted.  Patient arises from chair with difficulty. Wide based gait with crutches.         Assessment & Plan:  This is a 57 year old female with  1. Fibromyalgia  2. 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 Patient has not followed up with ortho yet.  3. Shoulder pain  4. Back pain  5. Depression ,the patient is back on her Effexor, and in a good mood today.  Plan :  Patient had x-rays of her knees done since the last visit. The x-rays showed mild arthritis in the lateral compartment of her knees. Advised the patient to exercise preferable in a pool, patient states, that she has no transportation during the week, and did not really wanted to go during the weekend. Explained to the patient that she has just mild degenerative changes, and that strengthening the muscles around the knee, would help with her pain and take stress off her knees. Patient does not really want to do exercises. I also offered to order PT or aquatic PT, the patient declined. Recommended a referral to a pain psychologist to  learn coping strategies for her pain , patient denied this too.She should take more accountability/ initiative for her deconditioned muscles and resulting pain. Refilled methadone 10mg , the dosage prescribed to her was 20 mg twice a day, and Hydrocodone 10mg  4x/day. Prescribed Robaxin 500mg  tid , instead of the flexeril, which upsets her stomach, patient has not picked up this medication because of financial issues.  Refilled her methadone, and hydrocodone.  Follow up in 1 month.

## 2012-09-04 ENCOUNTER — Encounter: Payer: Medicare Other | Admitting: Physical Medicine and Rehabilitation

## 2012-09-10 ENCOUNTER — Encounter
Payer: Medicare Other | Attending: Physical Medicine and Rehabilitation | Admitting: Physical Medicine and Rehabilitation

## 2012-09-10 ENCOUNTER — Encounter: Payer: Self-pay | Admitting: Physical Medicine and Rehabilitation

## 2012-09-10 VITALS — BP 149/67 | HR 99 | Resp 16 | Ht 63.0 in | Wt 193.0 lb

## 2012-09-10 DIAGNOSIS — F3289 Other specified depressive episodes: Secondary | ICD-10-CM

## 2012-09-10 DIAGNOSIS — M170 Bilateral primary osteoarthritis of knee: Secondary | ICD-10-CM

## 2012-09-10 DIAGNOSIS — IMO0001 Reserved for inherently not codable concepts without codable children: Secondary | ICD-10-CM

## 2012-09-10 DIAGNOSIS — IMO0002 Reserved for concepts with insufficient information to code with codable children: Secondary | ICD-10-CM

## 2012-09-10 DIAGNOSIS — M1710 Unilateral primary osteoarthritis, unspecified knee: Secondary | ICD-10-CM | POA: Insufficient documentation

## 2012-09-10 DIAGNOSIS — Z79899 Other long term (current) drug therapy: Secondary | ICD-10-CM

## 2012-09-10 DIAGNOSIS — F329 Major depressive disorder, single episode, unspecified: Secondary | ICD-10-CM | POA: Insufficient documentation

## 2012-09-10 DIAGNOSIS — M549 Dorsalgia, unspecified: Secondary | ICD-10-CM | POA: Insufficient documentation

## 2012-09-10 DIAGNOSIS — Z5181 Encounter for therapeutic drug level monitoring: Secondary | ICD-10-CM

## 2012-09-10 DIAGNOSIS — M25519 Pain in unspecified shoulder: Secondary | ICD-10-CM | POA: Insufficient documentation

## 2012-09-10 DIAGNOSIS — G8929 Other chronic pain: Secondary | ICD-10-CM | POA: Insufficient documentation

## 2012-09-10 MED ORDER — HYDROCODONE-ACETAMINOPHEN 10-325 MG PO TABS: 1.0000 | ORAL_TABLET | Freq: Four times a day (QID) | ORAL | Status: DC | PRN

## 2012-09-10 MED ORDER — DICLOFENAC SODIUM 1 % TD GEL: 2.0000 g | Freq: Four times a day (QID) | TRANSDERMAL | Status: DC

## 2012-09-10 MED ORDER — METHADONE HCL 10 MG PO TABS: 20.0000 mg | ORAL_TABLET | Freq: Two times a day (BID) | ORAL | Status: DC

## 2012-09-10 NOTE — Patient Instructions (Signed)
Try to stay as active as pain permits. 

## 2012-09-10 NOTE — Addendum Note (Signed)
Addended by: Doreene Eland on: 09/10/2012 02:02 PM   Modules accepted: Orders

## 2012-09-10 NOTE — Progress Notes (Signed)
Subjective:    Patient ID: Morgan Wilkerson, female    DOB: Feb 28, 1956, 57 y.o.   MRN: [id number]  HPI The patient complains about chronic bilateral knee and bilateral shoulder pain. The patient denies any radiation. The patient also complains about some back pain. The main problem is her knee pain.  The problem has been stable. She states, that the Voltaren gel has given her good relief. The patient is in a good mood today, she states, that she is back on her effexor.  The patient states that her TENS unit is not working anymore, she would like to have a new one.  Pain Inventory Average Pain 8 Pain Right Now 6 My pain is constant, sharp, burning, dull, stabbing, tingling and aching  In the last 24 hours, has pain interfered with the following? General activity n/a Relation with others n/a Enjoyment of life n/a What TIME of day is your pain at its worst? constant Sleep (in general) Fair  Pain is worse with: walking, bending, sitting, inactivity, standing and some activites Pain improves with: rest, heat/ice, medication and injections Relief from Meds: 6  Mobility how many minutes can you walk? 10-20 do you drive?  no use a wheelchair Do you have any goals in this area?  yes  Function disabled: date disabled n/a I need assistance with the following:  feeding, dressing, bathing, toileting, household duties and shopping Do you have any goals in this area?  yes  Neuro/Psych bladder control problems weakness numbness tremor tingling trouble walking spasms dizziness confusion depression anxiety  Prior Studies Any changes since last visit?  no  Physicians involved in your care Any changes since last visit?  no   Family History  Problem Relation Age of Onset  . Hypertension Mother   . Aneurysm Maternal Aunt   . Stroke Other    History   Social History  . Marital Status: Married    Spouse Name: N/A    Number of Children: N/A  . Years of Education: N/A    Social History Main Topics  . Smoking status: Never Smoker   . Smokeless tobacco: Never Used  . Alcohol Use: No     Comment: Rarely.   . Drug Use: No  . Sexually Active: None   Other Topics Concern  . None   Social History Narrative   Patients phone number is [phone]   Past Surgical History  Procedure Laterality Date  . Vaginal hysterectomy  2003    with oophorectomy  . Temporal artery biopsy / ligation  6/09  . Cystoscopy      With hydrolic bladder distension  . Cholecystectomy     Past Medical History  Diagnosis Date  . Panic disorder with agoraphobia   . Hypertension   . History of frostbite     Left breast  . Dysfunctional uterine bleeding     sp hysterectomy  . Right shoulder pain     bone cracked but went into the muscle.  . Left shoulder pain     torn tendon  . Toe fracture 2001/2002    hx of -ring toe  . Temporal arteritis 5/09    Biopsy demonstrated  . Diabetes mellitus 6/09  . History of acute cystitis 2003    Complicated course post vaginal hysterectomy , required abx bladder instillation x 2 by urology  . Gastritis   . Fibromyalgia   . Bicipital tenosynovitis   . Calcifying tendinitis of shoulder   . Anxiety   . Depression   .  Osteoarthrosis, unspecified whether generalized or localized, lower leg    BP 149/67  Pulse 99  Resp 16  Ht 5\' 3"  (1.6 m)  Wt 193 lb (87.544 kg)  BMI 34.2 kg/m2  SpO2 97%  LMP 03/08/2002     Review of Systems  Constitutional: Positive for fever and chills.  Respiratory: Positive for cough and shortness of breath.   Cardiovascular: Positive for leg swelling.  Gastrointestinal: Positive for nausea, abdominal pain and constipation.  Musculoskeletal: Positive for gait problem.  Neurological: Positive for dizziness, tremors, weakness and numbness.  Psychiatric/Behavioral: Positive for confusion, dysphoric mood and agitation.  All other systems reviewed and are negative.       Objective:   Physical  Exam Constitutional: She is oriented to person, place, and time. She appears well-developed and well-nourished.  HENT:  Head: Normocephalic.  Neck: Neck supple.  Musculoskeletal: She exhibits tenderness.  Neurological: She is alert and oriented to person, place, and time.  Skin: Skin is warm and dry.  Psychiatric: She is in a good mood today.  Symmetric normal motor tone is noted throughout. Normal muscle bulk. Muscle testing reveals 5/5 muscle strength of the upper extremity, and 5/5 of the lower extremity. Full range of motion in upper and lower extremities, except deficit in knee extension of 5-10 degrees bilateral, because of pain, and mild deficit in abduction with external rotation of about 10 degrees in shoulders bilateral, also because of pain. ROM of spine is restricted.  DTR in the upper and lower extremity are present and symmetric 2+. No clonus is noted.  Patient arises from chair with difficulty. Wide based gait with crutches.         Assessment & Plan:  This is a 57 year old female with  1. Fibromyalgia  2. 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 Patient has not followed up with ortho yet.  3. Shoulder pain  4. Back pain  5. Depression ,the patient is back on her Effexor, and in a good mood today.  Plan :  Patient had x-rays of her knees done since the last visit. The x-rays showed mild arthritis in the lateral compartment of her knees. Advised the patient to exercise preferable in a pool, patient states, that she has no transportation during the week, and did not really wanted to go during the weekend. Explained to the patient that she has just mild degenerative changes, and that strengthening the muscles around the knee, would  help with her pain and take stress off her knees. Patient does not really want to do exercises. I also offered to order PT or aquatic PT, the patient declined. Recommended a referral to a pain psychologist to learn coping strategies for her pain , patient denied this too.She should take more accountability/ initiative for her deconditioned muscles and resulting pain. Refilled methadone 10mg , the dosage prescribed to her was 20 mg twice a day, and Hydrocodone 10mg  4x/day. Prescribed Robaxin 500mg  tid , instead of the flexeril, which upsets her stomach, patient has not picked up this medication because of financial issues.  Refilled her methadone, and hydrocodone. Ordered a new TENS unit, with instructions by PT  Follow up in 1 month.

## 2012-09-13 ENCOUNTER — Encounter: Payer: Self-pay | Admitting: Internal Medicine

## 2012-09-13 ENCOUNTER — Other Ambulatory Visit: Payer: Self-pay | Admitting: Internal Medicine

## 2012-09-13 ENCOUNTER — Ambulatory Visit (INDEPENDENT_AMBULATORY_CARE_PROVIDER_SITE_OTHER): Payer: Medicare Other | Admitting: Internal Medicine

## 2012-09-13 VITALS — BP 151/94 | HR 136 | Temp 99.3°F | Wt 193.9 lb

## 2012-09-13 DIAGNOSIS — E119 Type 2 diabetes mellitus without complications: Secondary | ICD-10-CM

## 2012-09-13 DIAGNOSIS — Z1231 Encounter for screening mammogram for malignant neoplasm of breast: Secondary | ICD-10-CM

## 2012-09-13 DIAGNOSIS — F329 Major depressive disorder, single episode, unspecified: Secondary | ICD-10-CM

## 2012-09-13 DIAGNOSIS — M1710 Unilateral primary osteoarthritis, unspecified knee: Secondary | ICD-10-CM

## 2012-09-13 DIAGNOSIS — I498 Other specified cardiac arrhythmias: Secondary | ICD-10-CM

## 2012-09-13 DIAGNOSIS — F32A Depression, unspecified: Secondary | ICD-10-CM

## 2012-09-13 DIAGNOSIS — M170 Bilateral primary osteoarthritis of knee: Secondary | ICD-10-CM

## 2012-09-13 DIAGNOSIS — Z23 Encounter for immunization: Secondary | ICD-10-CM

## 2012-09-13 DIAGNOSIS — I4711 Inappropriate sinus tachycardia, so stated: Secondary | ICD-10-CM

## 2012-09-13 DIAGNOSIS — M62830 Muscle spasm of back: Secondary | ICD-10-CM

## 2012-09-13 DIAGNOSIS — F4001 Agoraphobia with panic disorder: Secondary | ICD-10-CM

## 2012-09-13 DIAGNOSIS — M949 Disorder of cartilage, unspecified: Secondary | ICD-10-CM

## 2012-09-13 DIAGNOSIS — J302 Other seasonal allergic rhinitis: Secondary | ICD-10-CM | POA: Insufficient documentation

## 2012-09-13 DIAGNOSIS — G894 Chronic pain syndrome: Secondary | ICD-10-CM

## 2012-09-13 DIAGNOSIS — D638 Anemia in other chronic diseases classified elsewhere: Secondary | ICD-10-CM | POA: Insufficient documentation

## 2012-09-13 DIAGNOSIS — Z0000 Encounter for general adult medical examination without abnormal findings: Secondary | ICD-10-CM

## 2012-09-13 DIAGNOSIS — K219 Gastro-esophageal reflux disease without esophagitis: Secondary | ICD-10-CM

## 2012-09-13 DIAGNOSIS — N6310 Unspecified lump in the right breast, unspecified quadrant: Secondary | ICD-10-CM

## 2012-09-13 DIAGNOSIS — R000 Tachycardia, unspecified: Secondary | ICD-10-CM

## 2012-09-13 DIAGNOSIS — M8580 Other specified disorders of bone density and structure, unspecified site: Secondary | ICD-10-CM

## 2012-09-13 DIAGNOSIS — I10 Essential (primary) hypertension: Secondary | ICD-10-CM

## 2012-09-13 DIAGNOSIS — M5380 Other specified dorsopathies, site unspecified: Secondary | ICD-10-CM

## 2012-09-13 LAB — LIPID PANEL
Cholesterol: 174 mg/dL (ref 0–200)
HDL: 37 mg/dL — ABNORMAL LOW (ref >39)
Triglycerides: 300 mg/dL — ABNORMAL HIGH (ref <150)

## 2012-09-13 LAB — POCT GLYCOSYLATED HEMOGLOBIN (HGB A1C): Hemoglobin A1C: 5.9

## 2012-09-13 LAB — GLUCOSE, CAPILLARY: Glucose-Capillary: 123 mg/dL — ABNORMAL HIGH (ref 70–99)

## 2012-09-13 MED ORDER — METOPROLOL TARTRATE 50 MG PO TABS: 50.0000 mg | ORAL_TABLET | Freq: Two times a day (BID) | ORAL | Status: DC

## 2012-09-13 MED ORDER — BACLOFEN 10 MG PO TABS: 10.0000 mg | ORAL_TABLET | Freq: Three times a day (TID) | ORAL | Status: DC | PRN

## 2012-09-13 NOTE — Assessment & Plan Note (Signed)
There remains considerable financial stressors as she is unemployed. Nonetheless, the amitriptyline and venlafaxine have been effective in blunting her depressive symptoms. We will continue with both of these medications at the current doses.

## 2012-09-13 NOTE — Assessment & Plan Note (Signed)
She's compliant with her metformin and reports no hypoglycemic episodes. Her hemoglobin A1c today is well below target at 5.9. One could argue that she no longer needs the metformin at 500 mg daily. Given she is doing well we will continue at this point and discuss discontinuance of the metformin at the followup visit if her hemoglobin A1c remains very well-controlled. She has been referred for a diabetic eye examination as she is due. Her diabetic foot examination was unremarkable, her lipid profile and urine microalbumin are pending at this time. Any necessary adjustments in her regimen will be made pending the results of these tests.

## 2012-09-13 NOTE — Assessment & Plan Note (Signed)
Her symptoms are well-controlled on pantoprazole. We will therefore continue this medication at the current dose of 40 mg daily.

## 2012-09-13 NOTE — Assessment & Plan Note (Addendum)
She is followed by the pain clinic and receives her methadone and hydrocodone there. She notes continued pain but improvement with these medications. Any further adjustments will be dictated by Dr. Hermelinda Medicus of the pain clinic. Given the bilateral paraspinal muscle spasm found on exam we will start baclofen 10 mg by mouth every 8 hours as needed for muscle spasms. She was warned of the side effect of somnolence and was advised to take the first dose before bed to get a better idea of how she would react to it. We will reassess the control of her muscle spasms on the baclofen at the followup visit.

## 2012-09-13 NOTE — Assessment & Plan Note (Signed)
There were several issues that we discussed during this visit. As noted above she is interested in a followup DEXA scan and this referral has been placed. We have also placed a referral for a mammogram since it has been nearly 4 years since she's had one. We discussed the issue of fecal occult blood testing versus colonoscopy for colon cancer screening. She is rather overwhelmed by the other tests that we will be ordering for preventative care and would rather defer this at this time. She would like [place] to bring this topic up at the followup visit. She would also like to discuss the issue of Pneumovax at the followup visit, but was interested and did receive a tetanus with diphtheria shot today.

## 2012-09-13 NOTE — Assessment & Plan Note (Signed)
She continues to have bilateral knee pain with significant crepitus. The Voltaren gel, methadone, and Narco are helping to control her pain. We will continue with these medications.

## 2012-09-13 NOTE — Patient Instructions (Signed)
It was nice to meet you. I look forward to taking care of you for years to come.  1) I changed your metoprolol to 50 mg by mouth twice a day.  This is the generic medication and should be $4.  2) I started baclofen 10 mg by mouth as needed up to three times a day for spasms.  This medication can make you sleepy so take the first dose at night.  3) You got a tetanus shot today.  4) We will set up an eye appointment, mammogram, and a DEXA scan.  5) We will see you in 3 months, sooner if necessary.

## 2012-09-13 NOTE — Assessment & Plan Note (Signed)
It has been almost 5 years since she's had a re-evaluation of her osteopenia. She continues to take the calcium and vitamin D although is asking for prescription strength. I could not locate such a prescription medication that could not be purchased over-the-counter when I put it into the Epic list. She states her mother takes a prescription calcium and vitamin D and she will call me with the name of that brand so that I can place it into the orders. We will also refer her for DEXA scan to make sure she has not had progression of her osteopenia. If she has, and qualifies for pharmacotherapy, it will be initiated at that time.

## 2012-09-13 NOTE — Assessment & Plan Note (Signed)
Her symptoms are stable on the venlafaxine. She is on a maximal dose and we will continue at this time.

## 2012-09-13 NOTE — Assessment & Plan Note (Signed)
Her blood pressure today is elevated at 151/94. This is above target. She was previously on Toprol XL but could no longer afford it. She requested that she be switched to a generic that she could afford. I placed her on metoprolol 50 mg by mouth twice a day. This is a slight increase in dose in hopes of getting better control of her hypertension. We will reassess her blood pressure at the followup visit. If she remains tachycardic we will increase the metoprolol dose to 100 mg by mouth twice a day in order to help avoid a tachycardia cardiomyopathy. If she is not tachycardic at that time, we will consider adding an ACE inhibitor given the underlying diagnosis of diabetes.

## 2012-09-13 NOTE — Assessment & Plan Note (Signed)
She has an underlying inappropriate tachycardia. Her metoprolol dose was increased at this visit and we will reassess if we have better control of the tachycardia moving forward when she is seen in the followup visit. It is important to get control of this inappropriate sinus tachycardia so as to avoid a tachycardic cardiomyopathy.

## 2012-09-13 NOTE — Progress Notes (Signed)
  Subjective:    Patient ID: Morgan Wilkerson, female    DOB: 1956/04/20, 57 y.o.   MRN: [id number]  HPI  Please see the A&P for the status of the pt's chronic medical problems.  Review of Systems  Constitutional: Positive for fatigue. Negative for activity change and unexpected weight change.  HENT: Positive for congestion, rhinorrhea and sneezing.   Eyes: Positive for pain. Negative for photophobia, discharge, redness and itching.  Cardiovascular: Negative for chest pain and leg swelling.  Gastrointestinal: Positive for nausea, abdominal pain and constipation. Negative for vomiting and diarrhea.  Genitourinary: Positive for pelvic pain.  Musculoskeletal: Positive for myalgias, back pain, arthralgias and gait problem. Negative for joint swelling.  Skin: Negative for color change, rash and wound.  Allergic/Immunologic: Positive for environmental allergies.  Neurological: Positive for headaches. Negative for seizures and facial asymmetry.  Psychiatric/Behavioral: Positive for dysphoric mood. The patient is nervous/anxious.       Objective:   Physical Exam  Nursing note and vitals reviewed. Constitutional: She is oriented to person, place, and time. She appears well-developed and well-nourished. No distress.  HENT:  Head: Normocephalic and atraumatic.  Mouth/Throat: No oropharyngeal exudate.  Eyes: Conjunctivae are normal. Right eye exhibits no discharge. Left eye exhibits no discharge. No scleral icterus.  Neck: Normal range of motion. Neck supple. No tracheal deviation present.  Cardiovascular: Regular rhythm and normal heart sounds.  Tachycardia present.  Exam reveals no gallop and no friction rub.   No murmur heard. Pulmonary/Chest: Effort normal and breath sounds normal. No stridor. No respiratory distress. She has no wheezes. She has no rales.  Abdominal: Soft. Bowel sounds are normal. She exhibits no distension. There is no tenderness. There is no rebound and no guarding.    Musculoskeletal: Normal range of motion. She exhibits no edema and no tenderness.       Legs: Lymphadenopathy:    She has no cervical adenopathy.  Neurological: She is alert and oriented to person, place, and time.  Skin: Skin is warm and dry. No rash noted. She is not diaphoretic. No erythema.  Psychiatric: She has a normal mood and affect. Her speech is normal and behavior is normal. Judgment and thought content normal. Cognition and memory are normal.      Assessment & Plan:   Please See Problem Oriented Charting.

## 2012-09-14 LAB — MICROALBUMIN / CREATININE URINE RATIO
Creatinine, Urine: 204.8 mg/dL
Microalb Creat Ratio: 10.4 mg/g (ref 0.0–30.0)
Microalb, Ur: 2.12 mg/dL — ABNORMAL HIGH (ref 0.00–1.89)

## 2012-09-14 NOTE — Addendum Note (Signed)
Addended by: Doneen Poisson D on: 09/14/2012 06:29 PM   Modules accepted: Medications

## 2012-09-14 NOTE — Progress Notes (Signed)
Microalbumin 2.12 Creatinine 204.8 Microalbumin/Creatinine ratio 10.4  We will continue current management.  Once innappropriate sinus tachycardia better controlled we will consider the addition of an ACEI if there is room with the blood pressure.  Total cholesterol 174 Triglycerides 300 HDL 37 LDL 77  No need for addition of statin therapy at this time.  Will repeat lipid panel in 1 year since she is a diabetic.

## 2012-09-21 ENCOUNTER — Other Ambulatory Visit: Payer: Self-pay | Admitting: Internal Medicine

## 2012-09-21 DIAGNOSIS — E119 Type 2 diabetes mellitus without complications: Secondary | ICD-10-CM

## 2012-09-25 ENCOUNTER — Other Ambulatory Visit: Payer: Self-pay | Admitting: Internal Medicine

## 2012-09-26 ENCOUNTER — Telehealth: Payer: Self-pay

## 2012-09-26 ENCOUNTER — Ambulatory Visit (HOSPITAL_COMMUNITY): Admission: RE | Admit: 2012-09-26 | Payer: Medicare Other | Source: Ambulatory Visit

## 2012-09-26 ENCOUNTER — Ambulatory Visit
Admission: RE | Admit: 2012-09-26 | Discharge: 2012-09-26 | Disposition: A | Discharge status: Home / Self Care | Payer: Medicare Other | Source: Ambulatory Visit | Attending: Internal Medicine | Admitting: Internal Medicine

## 2012-09-26 DIAGNOSIS — N6310 Unspecified lump in the right breast, unspecified quadrant: Secondary | ICD-10-CM

## 2012-09-26 NOTE — Telephone Encounter (Signed)
I do not see that we prescribed it in the past, we will only refill it if we did

## 2012-09-26 NOTE — Telephone Encounter (Signed)
Left message for patient to call office regarding pantoprozole request.

## 2012-09-26 NOTE — Telephone Encounter (Signed)
Patient called requesting pantoprozole refill.

## 2012-09-28 ENCOUNTER — Encounter: Payer: Self-pay | Admitting: Internal Medicine

## 2012-10-05 ENCOUNTER — Encounter: Payer: Self-pay | Admitting: Internal Medicine

## 2012-10-05 ENCOUNTER — Ambulatory Visit (INDEPENDENT_AMBULATORY_CARE_PROVIDER_SITE_OTHER): Payer: Medicare Other | Admitting: Internal Medicine

## 2012-10-05 VITALS — BP 124/72 | HR 83 | Temp 98.9°F | Ht 63.0 in | Wt 195.7 lb

## 2012-10-05 DIAGNOSIS — J302 Other seasonal allergic rhinitis: Secondary | ICD-10-CM

## 2012-10-05 DIAGNOSIS — J309 Allergic rhinitis, unspecified: Secondary | ICD-10-CM

## 2012-10-05 DIAGNOSIS — R0982 Postnasal drip: Secondary | ICD-10-CM

## 2012-10-05 MED ORDER — LORATADINE 10 MG PO TABS: 10.0000 mg | ORAL_TABLET | Freq: Every day | ORAL | Status: DC

## 2012-10-05 NOTE — Assessment & Plan Note (Signed)
History and physical exam are most consistent with allergic rhinosinusitis and postnasal drip resulting in a sore throat. There is no evidence of infection, viral or bacterial. Strep pharyngitis score is 0 with a predicted risk of GAS pharyngitis of less than 10%. No further testing or antibiotics are indicated. - Start loratadine 10 mg daily - Continue diphenhydramine over-the-counter when necessary - Continue using lozenges and analgesic throat spray use when necessary - Patient was given explicit return to clinic instructions

## 2012-10-05 NOTE — Patient Instructions (Signed)
Start taking LORATADINE 10 mg every day.  Take DIPHENHYDRAMINE (BENADRYL) as well to decrease secretions and congestion.  Continue using throat sprays and lozenges as needed for symptoms.  Come back to the clinic in one week if there is no improvement.  SEEK IMMEDIATE MEDICAL CARE IF:  You have difficulty breathing.  You are unable to swallow fluids, soft foods, or your saliva.  You have increased swelling in the throat.  Your sore throat does not get better in 7 days.  You have nausea and vomiting.  You have a fever and your symptoms suddenly get worse.

## 2012-10-05 NOTE — Progress Notes (Signed)
  Subjective:    Patient ID: Morgan Wilkerson, female    DOB: [birth year], 57 y.o.   MRN: [id number]  CC: Sore throat  HPI:  57 year old female presenting to clinic with a sore throat.  Sore Throat  This is a new problem. The current episode started in the past 7 days. The problem has been gradually worsening. Neither side of throat is experiencing more pain than the other. There has been no fever. The pain is at a severity of 7/10. The pain is severe. Associated symptoms include congestion, headaches (frontal), a plugged ear sensation, shortness of breath and trouble swallowing. Pertinent negatives include no abdominal pain, coughing, diarrhea, drooling, ear discharge, ear pain, hoarse voice, swollen glands or vomiting. She has had no exposure to strep or mono. She has tried acetaminophen, oral narcotic analgesics and cool liquids for the symptoms. The treatment provided mild relief.    Review of Systems  Constitutional: Positive for chills. Negative for fever.  HENT: Positive for congestion and trouble swallowing. Negative for ear pain, hoarse voice, drooling and ear discharge.   Eyes: Positive for discharge (watery) and itching.  Respiratory: Positive for shortness of breath. Negative for cough.   Cardiovascular: Negative for chest pain.  Gastrointestinal: Negative for vomiting, abdominal pain and diarrhea.  Musculoskeletal: Negative for myalgias and arthralgias.  Skin: Negative for rash.  Allergic/Immunologic: Positive for environmental allergies (seasonal).  Neurological: Positive for headaches (frontal).  Hematological: Negative for adenopathy.    Current Related Medications: 1.   OTC diphenhydramine when necessary 2.   Prescription hydrocodone-acetaminophen 10-325 mg when necessary 3.   OTC chloraseptic analgesic spray when necessary     Objective:   Physical Exam  GENERAL: overweight; no acute distress HEAD: atraumatic, normocephalic EYES: pupils equal, round and reactive;  sclera anicteric; normal conjunctiva EARS: external ears normal; no tragal tenderness canals ceruminous but patent; and TMs scarred but without erythema, effusion, or bulging NOSE: normal nasal mucosa, no erythema or drainage, turbinates are not inflamed or erythematous MOUTH/THROAT: oropharynx clear (specifically not erythematous), no lesions appreciated, moist mucous membranes, pink gingiva, normal dentition, tonsils are not swollen or erythematous, no exudates appreciated NECK: supple, thyroid normal in size and without palpable nodules LYMPH: no cervical, preauricular, postauricular, submandibular, submental, or supraclavicular lymphadenopathy LUNGS: clear to auscultation bilaterally, normal work of breathing HEART: normal rate and regular rhythm; normal S1 and S2 without S3 or S4; no murmurs, rubs, or clicks SKIN: warm, dry, intact, normal turgor, no rashes   Filed Vitals:   10/05/12 1500  BP: 124/72  Pulse: 83  Temp: 98.9 F (37.2 C)    BP Readings from Last 3 Encounters:  10/05/12 124/72  09/13/12 151/94  09/10/12 149/67        Assessment & Plan:

## 2012-10-09 NOTE — Progress Notes (Signed)
Case discussed with Dr. Gwynn Burly at the time of the visit, immediately after the resident saw the patient.  I reviewed the resident's history and exam and pertinent patient test results.  I agree with the assessment, diagnosis and plan of care documented in the resident's note.

## 2012-10-10 ENCOUNTER — Telehealth: Payer: Self-pay | Admitting: *Deleted

## 2012-10-10 ENCOUNTER — Other Ambulatory Visit: Payer: Self-pay | Admitting: *Deleted

## 2012-10-10 DIAGNOSIS — F410 Panic disorder [episodic paroxysmal anxiety] without agoraphobia: Secondary | ICD-10-CM

## 2012-10-10 MED ORDER — VENLAFAXINE HCL 75 MG PO TABS: ORAL_TABLET | ORAL | Status: DC

## 2012-10-10 NOTE — Telephone Encounter (Signed)
Requesting a refill on her effexor.  It does not look like we have filled this for her before.

## 2012-10-10 NOTE — Telephone Encounter (Signed)
Spoke with Morgan Wilkerson and advised her we do no prescribe this medication and she will follow up with her other provider.

## 2012-10-16 ENCOUNTER — Encounter: Payer: Self-pay | Admitting: Dietician

## 2012-10-18 ENCOUNTER — Encounter: Payer: Self-pay | Admitting: Physical Medicine and Rehabilitation

## 2012-10-18 ENCOUNTER — Encounter
Payer: Medicare Other | Attending: Physical Medicine and Rehabilitation | Admitting: Physical Medicine and Rehabilitation

## 2012-10-18 VITALS — BP 159/87 | HR 120 | Resp 14 | Ht 63.5 in | Wt 192.0 lb

## 2012-10-18 DIAGNOSIS — M25519 Pain in unspecified shoulder: Secondary | ICD-10-CM | POA: Insufficient documentation

## 2012-10-18 DIAGNOSIS — IMO0001 Reserved for inherently not codable concepts without codable children: Secondary | ICD-10-CM

## 2012-10-18 DIAGNOSIS — Z598 Other problems related to housing and economic circumstances: Secondary | ICD-10-CM | POA: Insufficient documentation

## 2012-10-18 DIAGNOSIS — M1710 Unilateral primary osteoarthritis, unspecified knee: Secondary | ICD-10-CM | POA: Insufficient documentation

## 2012-10-18 DIAGNOSIS — F3289 Other specified depressive episodes: Secondary | ICD-10-CM | POA: Insufficient documentation

## 2012-10-18 DIAGNOSIS — M170 Bilateral primary osteoarthritis of knee: Secondary | ICD-10-CM

## 2012-10-18 DIAGNOSIS — F329 Major depressive disorder, single episode, unspecified: Secondary | ICD-10-CM

## 2012-10-18 DIAGNOSIS — G8929 Other chronic pain: Secondary | ICD-10-CM | POA: Insufficient documentation

## 2012-10-18 DIAGNOSIS — F32A Depression, unspecified: Secondary | ICD-10-CM

## 2012-10-18 DIAGNOSIS — M549 Dorsalgia, unspecified: Secondary | ICD-10-CM | POA: Insufficient documentation

## 2012-10-18 DIAGNOSIS — Z5987 Material hardship due to limited financial resources, not elsewhere classified: Secondary | ICD-10-CM | POA: Insufficient documentation

## 2012-10-18 MED ORDER — METHADONE HCL 10 MG PO TABS: 20.0000 mg | ORAL_TABLET | Freq: Two times a day (BID) | ORAL | Status: DC

## 2012-10-18 MED ORDER — HYDROCODONE-ACETAMINOPHEN 10-325 MG PO TABS: 1.0000 | ORAL_TABLET | Freq: Four times a day (QID) | ORAL | Status: DC | PRN

## 2012-10-18 NOTE — Progress Notes (Signed)
Subjective:    Patient ID: Morgan Wilkerson, female    DOB: [birth year], 57 y.o.   MRN: [id number]  HPI The patient complains about chronic bilateral knee and bilateral shoulder pain. The patient denies any radiation. The patient also complains about some back pain. The main problem is her knee pain.  The problem has been stable. She states, that the Voltaren gel has given her good relief. The patient is in a good mood today, she states, that she is back on her effexor.    Pain Inventory Average Pain 9 Pain Right Now 9 My pain is constant, sharp, stabbing, tingling and aching  In the last 24 hours, has pain interfered with the following? General activity 8 Relation with others 9 Enjoyment of life 9 What TIME of day is your pain at its worst? constant Sleep (in general) Fair  Pain is worse with: walking, bending, sitting, inactivity and standing Pain improves with: rest, heat/ice, medication and injections Relief from Meds: 5  Mobility walk with assistance how many minutes can you walk? 10 ability to climb steps?  no do you drive?  yes use a wheelchair Do you have any goals in this area?  yes  Function disabled: date disabled filed I need assistance with the following:  dressing, bathing, toileting, meal prep, household duties and shopping Do you have any goals in this area?  yes  Neuro/Psych bladder control problems bowel control problems weakness numbness tremor tingling trouble walking spasms dizziness confusion depression anxiety  Prior Studies Any changes since last visit?  no  Physicians involved in your care Any changes since last visit?  no   Family History  Problem Relation Age of Onset  . Hypertension Mother   . Cerebral aneurysm Maternal Aunt   . Early death Father 55    Raod accident  . Healthy Sister   . Healthy Brother   . Healthy Daughter   . Cerebral aneurysm Maternal Grandfather   . Cerebral aneurysm Maternal Aunt   . Healthy Sister    . Healthy Sister   . Healthy Sister   . Healthy Sister   . Healthy Brother   . Healthy Brother   . Healthy Grandchild    History   Social History  . Marital Status: Married    Spouse Name: N/A    Number of Children: N/A  . Years of Education: N/A   Social History Main Topics  . Smoking status: Never Smoker   . Smokeless tobacco: Never Used  . Alcohol Use: No     Comment: Rarely.   . Drug Use: No  . Sexually Active: No   Other Topics Concern  . None   Social History Narrative   Patients phone number is [phone]   Past Surgical History  Procedure Laterality Date  . Temporal artery biopsy / ligation  6/09    Negative for temperal arteritis  . Cystoscopy      With hydrolic bladder distension  . Cholecystectomy      for chronic acalculus cholecystitis  . Vaginal hysterectomy  2003    with oophorectomy, for dysfunctional uterine bleeding   Past Medical History  Diagnosis Date  . Essential hypertension   . Toe fracture 2001/2002    Left pinky toe  . Diabetes mellitus 6/09  . Gastritis     Chronic, per biopsy  . Depression   . Seasonal allergic rhinitis     Spring time  . Gastroesophageal reflux disease   . Anxiety  Panic disorder  . Anemia     Anemia of chronic disease  . Osteoarthritis of both knees     Tricompartmental, worse in lateral compartment  . Osteoarthritis of both shoulders     Mild  . Fibromyalgia   . Chronic pain syndrome   . Osteopenia     DEXA 05/06/2008: L spine T -1.0, Left femur T -0.9, Right femur T -1.1  . Inappropriate sinus tachycardia    BP 159/87  Pulse 120  Resp 14  Ht 5' 3.5" (1.613 m)  Wt 192 lb (87.091 kg)  BMI 33.47 kg/m2  SpO2 98%  LMP 03/08/2002     Review of Systems  Constitutional: Positive for fever, chills, diaphoresis and unexpected weight change.  Respiratory: Positive for shortness of breath.   Cardiovascular: Positive for leg swelling.  Gastrointestinal: Positive for constipation.   Genitourinary: Positive for difficulty urinating.  Musculoskeletal: Positive for back pain and gait problem.  Neurological: Positive for dizziness, tremors, weakness and numbness.  Psychiatric/Behavioral: Positive for confusion and dysphoric mood. The patient is nervous/anxious.   All other systems reviewed and are negative.       Objective:   Physical Exam Constitutional: She is oriented to person, place, and time. She appears well-developed and well-nourished.  HENT:  Head: Normocephalic.  Neck: Neck supple.  Musculoskeletal: She exhibits tenderness.  Neurological: She is alert and oriented to person, place, and time.  Skin: Skin is warm and dry.  Psychiatric: She is in a good mood today.  Symmetric normal motor tone is noted throughout. Normal muscle bulk. Muscle testing reveals 5/5 muscle strength of the upper extremity, and 5/5 of the lower extremity. Full range of motion in upper and lower extremities, except deficit in knee extension of 5-10 degrees bilateral, because of pain, and mild deficit in abduction with external rotation of about 10 degrees in shoulders bilateral, also because of pain. ROM of spine is restricted.  DTR in the upper and lower extremity are present and symmetric 2+. No clonus is noted.  Patient arises from chair with difficulty. Wide based gait with crutches.        Assessment & Plan:  This is a 57 year old female with  1. Fibromyalgia  2. 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 Patient has not followed up with ortho yet.  3. Shoulder pain  4. Back pain  5. Depression ,the patient is back on her Effexor, and in a good mood today.  Plan :  Patient had x-rays of her knees done since the last visit. The  x-rays showed mild arthritis in the lateral compartment of her knees. Advised the patient to exercise preferable in a pool, patient states, that she has no transportation during the week, and did not really wanted to go during the weekend. Explained to the patient that she has just mild degenerative changes, and that strengthening the muscles around the knee, would help with her pain and take stress off her knees. Patient does not really want to do exercises. I also offered to order PT or aquatic PT, the patient declined. Recommended a referral to a pain psychologist to learn coping strategies for her pain , patient denied this too.She should take more accountability/ initiative for her deconditioned muscles  and resulting pain. Refilled methadone 10mg , the dosage prescribed to her was 20 mg twice a day, and Hydrocodone 10mg  4x/day. Prescribed Robaxin 500mg  tid , instead of the flexeril, which upsets her stomach, patient has not picked up this medication because of financial issues.  Refilled her methadone, and hydrocodone.  Ordered a new TENS unit, with instructions by PT, she did not receive a call from PT yet, will talk to the front office. Follow up in 1 month.

## 2012-10-18 NOTE — Patient Instructions (Signed)
Try to stay as active as possible 

## 2012-10-25 ENCOUNTER — Other Ambulatory Visit: Payer: Self-pay | Admitting: Internal Medicine

## 2012-10-25 DIAGNOSIS — R110 Nausea: Secondary | ICD-10-CM

## 2012-10-29 NOTE — Telephone Encounter (Signed)
Patient was not on this medication when I last saw her and we did not discuss nausea during that visit either.  I tried calling the patient, but received an unidentified voice mail, so no message was left.  Will continue to try to clarify on the need for this medication before refilling as time permits.

## 2012-11-06 NOTE — Telephone Encounter (Signed)
I was able to speak with Morgan Wilkerson.  Apparently she has intermittent nausea and the phenergan helps.  She likes to have some at home for those times that she does need it.  I therefore renewed the medication.

## 2012-11-08 ENCOUNTER — Other Ambulatory Visit: Payer: Self-pay | Admitting: *Deleted

## 2012-11-08 DIAGNOSIS — K219 Gastro-esophageal reflux disease without esophagitis: Secondary | ICD-10-CM

## 2012-11-08 MED ORDER — PANTOPRAZOLE SODIUM 40 MG PO TBEC: 40.0000 mg | DELAYED_RELEASE_TABLET | Freq: Every day | ORAL | Status: DC

## 2012-11-15 ENCOUNTER — Other Ambulatory Visit: Payer: Self-pay

## 2012-11-19 ENCOUNTER — Encounter: Payer: Self-pay | Admitting: Physical Medicine and Rehabilitation

## 2012-11-19 ENCOUNTER — Encounter
Payer: Medicare Other | Attending: Physical Medicine and Rehabilitation | Admitting: Physical Medicine and Rehabilitation

## 2012-11-19 VITALS — BP 140/78 | HR 125 | Resp 16 | Ht 63.5 in | Wt 197.0 lb

## 2012-11-19 DIAGNOSIS — Z79899 Other long term (current) drug therapy: Secondary | ICD-10-CM | POA: Insufficient documentation

## 2012-11-19 DIAGNOSIS — M25569 Pain in unspecified knee: Secondary | ICD-10-CM | POA: Insufficient documentation

## 2012-11-19 DIAGNOSIS — K219 Gastro-esophageal reflux disease without esophagitis: Secondary | ICD-10-CM | POA: Insufficient documentation

## 2012-11-19 DIAGNOSIS — IMO0001 Reserved for inherently not codable concepts without codable children: Secondary | ICD-10-CM

## 2012-11-19 DIAGNOSIS — F329 Major depressive disorder, single episode, unspecified: Secondary | ICD-10-CM

## 2012-11-19 DIAGNOSIS — M1710 Unilateral primary osteoarthritis, unspecified knee: Secondary | ICD-10-CM

## 2012-11-19 DIAGNOSIS — M170 Bilateral primary osteoarthritis of knee: Secondary | ICD-10-CM

## 2012-11-19 DIAGNOSIS — M549 Dorsalgia, unspecified: Secondary | ICD-10-CM | POA: Insufficient documentation

## 2012-11-19 DIAGNOSIS — I10 Essential (primary) hypertension: Secondary | ICD-10-CM | POA: Insufficient documentation

## 2012-11-19 DIAGNOSIS — F3289 Other specified depressive episodes: Secondary | ICD-10-CM | POA: Insufficient documentation

## 2012-11-19 DIAGNOSIS — M25519 Pain in unspecified shoulder: Secondary | ICD-10-CM | POA: Insufficient documentation

## 2012-11-19 MED ORDER — HYDROCODONE-ACETAMINOPHEN 10-325 MG PO TABS: 1.0000 | ORAL_TABLET | Freq: Four times a day (QID) | ORAL | Status: DC | PRN

## 2012-11-19 MED ORDER — METHADONE HCL 10 MG PO TABS: 20.0000 mg | ORAL_TABLET | Freq: Two times a day (BID) | ORAL | Status: DC

## 2012-11-19 NOTE — Patient Instructions (Signed)
Try to stay as active as pain permits. 

## 2012-11-19 NOTE — Progress Notes (Signed)
Subjective:    Patient ID: Morgan Wilkerson, female    DOB: 11-Oct-1955, 57 y.o.   MRN: [id number]  HPI The patient complains about chronic bilateral knee and bilateral shoulder pain. The patient denies any radiation. The patient also complains about some back pain. The main problem is her knee pain.  The problem has been stable. She states, that the Voltaren gel has given her good relief. The patient is in a good mood today, she states, that she is back on her effexor.   Pain Inventory Average Pain 8 Pain Right Now 9 My pain is intermittent, constant, sharp, burning, stabbing, tingling and aching  In the last 24 hours, has pain interfered with the following? General activity 5 Relation with others 2 Enjoyment of life 7 What TIME of day is your pain at its worst? morning, evening and night Sleep (in general) Fair  Pain is worse with: walking, bending, sitting, inactivity, standing and some activites Pain improves with: heat/ice, pacing activities, medication, TENS and injections Relief from Meds: 4  Mobility walk with assistance use a walker how many minutes can you walk? 10 ability to climb steps?  yes Do you have any goals in this area?  yes  Function disabled: date disabled . I need assistance with the following:  dressing, bathing, meal prep, household duties and shopping Do you have any goals in this area?  yes  Neuro/Psych bladder control problems weakness numbness tremor tingling trouble walking spasms dizziness confusion depression anxiety  Prior Studies Any changes since last visit?  no  Physicians involved in your care Any changes since last visit?  no   Family History  Problem Relation Age of Onset  . Hypertension Mother   . Cerebral aneurysm Maternal Aunt   . Early death Father 80    Raod accident  . Healthy Sister   . Healthy Brother   . Healthy Daughter   . Cerebral aneurysm Maternal Grandfather   . Cerebral aneurysm Maternal Aunt   .  Healthy Sister   . Healthy Sister   . Healthy Sister   . Healthy Sister   . Healthy Brother   . Healthy Brother   . Healthy Grandchild    History   Social History  . Marital Status: Married    Spouse Name: N/A    Number of Children: N/A  . Years of Education: N/A   Social History Main Topics  . Smoking status: Never Smoker   . Smokeless tobacco: Never Used  . Alcohol Use: No     Comment: Rarely.   . Drug Use: No  . Sexually Active: No   Other Topics Concern  . None   Social History Narrative   Patients phone number is [phone]   Past Surgical History  Procedure Laterality Date  . Temporal artery biopsy / ligation  6/09    Negative for temperal arteritis  . Cystoscopy      With hydrolic bladder distension  . Cholecystectomy      for chronic acalculus cholecystitis  . Vaginal hysterectomy  2003    with oophorectomy, for dysfunctional uterine bleeding   Past Medical History  Diagnosis Date  . Essential hypertension   . Toe fracture 2001/2002    Left pinky toe  . Diabetes mellitus 6/09  . Gastritis     Chronic, per biopsy  . Depression   . Seasonal allergic rhinitis     Spring time  . Gastroesophageal reflux disease   . Anxiety  Panic disorder  . Anemia     Anemia of chronic disease  . Osteoarthritis of both knees     Tricompartmental, worse in lateral compartment  . Osteoarthritis of both shoulders     Mild  . Fibromyalgia   . Chronic pain syndrome   . Osteopenia     DEXA 05/06/2008: L spine T -1.0, Left femur T -0.9, Right femur T -1.1  . Inappropriate sinus tachycardia    BP 140/78  Pulse 125  Resp 16  Ht 5' 3.5" (1.613 m)  Wt 197 lb (89.359 kg)  BMI 34.35 kg/m2  SpO2 97%  LMP 03/08/2002     Review of Systems  Constitutional: Positive for chills, fatigue and unexpected weight change.  Gastrointestinal: Positive for nausea and constipation.  Genitourinary: Positive for difficulty urinating.  Neurological: Positive for dizziness,  tremors, weakness and numbness.  Psychiatric/Behavioral: Positive for confusion and dysphoric mood. The patient is nervous/anxious.   All other systems reviewed and are negative.       Objective:   Physical Exam Constitutional: She is oriented to person, place, and time. She appears well-developed and well-nourished.  HENT:  Head: Normocephalic.  Neck: Neck supple.  Musculoskeletal: She exhibits tenderness.  Neurological: She is alert and oriented to person, place, and time.  Skin: Skin is warm and dry.  Psychiatric: She is in a good mood today.  Symmetric normal motor tone is noted throughout. Normal muscle bulk. Muscle testing reveals 5/5 muscle strength of the upper extremity, and 5/5 of the lower extremity. Full range of motion in upper and lower extremities, except deficit in knee extension of 5-10 degrees bilateral, because of pain, and mild deficit in abduction with external rotation of about 10 degrees in shoulders bilateral, also because of pain. ROM of spine is restricted.  DTR in the upper and lower extremity are present and symmetric 2+. No clonus is noted.  Patient arises from chair with difficulty. Wide based gait with crutches.        Assessment & Plan:  This is a 57 year old female with  1. Fibromyalgia  2. 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 Patient has not followed up with ortho yet.  3. Shoulder pain  4. Back pain  5. Depression ,the patient is back on her Effexor, and in a good mood today.  Plan :  Patient had x-rays of her knees done since the last visit. The x-rays showed mild arthritis in the lateral compartment of her knees. Advised the patient to exercise preferable in a pool, patient states, that she has  no transportation during the week, and did not really wanted to go during the weekend. Explained to the patient that she has just mild degenerative changes, and that strengthening the muscles around the knee, would help with her pain and take stress off her knees. Patient does not really want to do exercises. I also offered to order PT or aquatic PT, the patient declined. Recommended a referral to a pain psychologist to learn coping strategies for her pain , patient denied this too.She should take more accountability/ initiative for her deconditioned muscles and resulting pain. Refilled methadone 10mg , the dosage prescribed to her was 20 mg twice a day, and Hydrocodone 10mg  4x/day. Prescribed  Robaxin 500mg  tid , instead of the flexeril, which upsets her stomach, patient has not picked up this medication because of financial issues.  Refilled her methadone, and hydrocodone.  Ordered a new TENS unit, with instructions by PT, she did not receive a call from PT yet, will talk to the front office.  Follow up in 1 month.

## 2012-12-10 ENCOUNTER — Other Ambulatory Visit: Payer: Self-pay | Admitting: Internal Medicine

## 2012-12-10 NOTE — Telephone Encounter (Signed)
Dr Josem Kaufmann wanted pt to return in Aug. Please sch 1st available with him.

## 2012-12-18 ENCOUNTER — Encounter: Payer: Medicare Other | Admitting: Physical Medicine and Rehabilitation

## 2013-01-08 ENCOUNTER — Encounter: Payer: Self-pay | Admitting: Physical Medicine and Rehabilitation

## 2013-01-08 ENCOUNTER — Encounter
Payer: Medicare Other | Attending: Physical Medicine and Rehabilitation | Admitting: Physical Medicine and Rehabilitation

## 2013-01-08 VITALS — BP 166/80 | HR 124 | Resp 16 | Ht 63.5 in | Wt 199.0 lb

## 2013-01-08 DIAGNOSIS — M549 Dorsalgia, unspecified: Secondary | ICD-10-CM | POA: Insufficient documentation

## 2013-01-08 DIAGNOSIS — M170 Bilateral primary osteoarthritis of knee: Secondary | ICD-10-CM

## 2013-01-08 DIAGNOSIS — G894 Chronic pain syndrome: Secondary | ICD-10-CM

## 2013-01-08 DIAGNOSIS — F3289 Other specified depressive episodes: Secondary | ICD-10-CM | POA: Insufficient documentation

## 2013-01-08 DIAGNOSIS — G8929 Other chronic pain: Secondary | ICD-10-CM | POA: Insufficient documentation

## 2013-01-08 DIAGNOSIS — F329 Major depressive disorder, single episode, unspecified: Secondary | ICD-10-CM | POA: Insufficient documentation

## 2013-01-08 DIAGNOSIS — IMO0001 Reserved for inherently not codable concepts without codable children: Secondary | ICD-10-CM | POA: Insufficient documentation

## 2013-01-08 DIAGNOSIS — M1710 Unilateral primary osteoarthritis, unspecified knee: Secondary | ICD-10-CM | POA: Insufficient documentation

## 2013-01-08 DIAGNOSIS — M25519 Pain in unspecified shoulder: Secondary | ICD-10-CM | POA: Insufficient documentation

## 2013-01-08 MED ORDER — METHADONE HCL 10 MG PO TABS: 20.0000 mg | ORAL_TABLET | Freq: Two times a day (BID) | ORAL | Status: DC

## 2013-01-08 MED ORDER — HYDROCODONE-ACETAMINOPHEN 10-325 MG PO TABS: 1.0000 | ORAL_TABLET | Freq: Four times a day (QID) | ORAL | Status: DC | PRN

## 2013-01-08 NOTE — Progress Notes (Signed)
Subjective:    Patient ID: Morgan Wilkerson, female    DOB: 10/22/1955, 57 y.o.   MRN: [id number]  HPI The patient complains about chronic bilateral knee and bilateral shoulder pain. The patient denies any radiation. The patient also complains about some back pain. The main problem is her knee pain.  The problem has been stable. She states, that the Voltaren gel has given her good relief. The patient is in a good mood today, she states, that she is back on her effexor.  Pain Inventory Average Pain 9 Pain Right Now 8 My pain is intermittent, constant, sharp, burning, stabbing, tingling and aching  In the last 24 hours, has pain interfered with the following? General activity 10 Relation with others 10 Enjoyment of life 10 What TIME of day is your pain at its worst? all day Sleep (in general) Fair  Pain is worse with: walking, bending, sitting, inactivity, standing, unsure and some activites Pain improves with: rest, heat/ice, medication and injections Relief from Meds: 7  Mobility walk with assistance use a walker how many minutes can you walk? 10 ability to climb steps?  yes Do you have any goals in this area?  yes  Function disabled: date disabled na I need assistance with the following:  dressing, bathing, meal prep, household duties and shopping Do you have any goals in this area?  yes  Neuro/Psych bladder control problems weakness tremor tingling trouble walking spasms dizziness confusion depression anxiety  Prior Studies Any changes since last visit?  no  Physicians involved in your care Any changes since last visit?  no   Family History  Problem Relation Age of Onset  . Hypertension Mother   . Cerebral aneurysm Maternal Aunt   . Early death Father 75    Raod accident  . Healthy Sister   . Healthy Brother   . Healthy Daughter   . Cerebral aneurysm Maternal Grandfather   . Cerebral aneurysm Maternal Aunt   . Healthy Sister   . Healthy Sister   .  Healthy Sister   . Healthy Sister   . Healthy Brother   . Healthy Brother   . Healthy Grandchild    History   Social History  . Marital Status: Married    Spouse Name: N/A    Number of Children: N/A  . Years of Education: N/A   Social History Main Topics  . Smoking status: Never Smoker   . Smokeless tobacco: Never Used  . Alcohol Use: No     Comment: Rarely.   . Drug Use: No  . Sexual Activity: No   Other Topics Concern  . None   Social History Narrative   Patients phone number is [phone]   Past Surgical History  Procedure Laterality Date  . Temporal artery biopsy / ligation  6/09    Negative for temperal arteritis  . Cystoscopy      With hydrolic bladder distension  . Cholecystectomy      for chronic acalculus cholecystitis  . Vaginal hysterectomy  2003    with oophorectomy, for dysfunctional uterine bleeding   Past Medical History  Diagnosis Date  . Essential hypertension   . Toe fracture 2001/2002    Left pinky toe  . Diabetes mellitus 6/09  . Gastritis     Chronic, per biopsy  . Depression   . Seasonal allergic rhinitis     Spring time  . Gastroesophageal reflux disease   . Anxiety     Panic disorder  . Anemia  Anemia of chronic disease  . Osteoarthritis of both knees     Tricompartmental, worse in lateral compartment  . Osteoarthritis of both shoulders     Mild  . Fibromyalgia   . Chronic pain syndrome   . Osteopenia     DEXA 05/06/2008: L spine T -1.0, Left femur T -0.9, Right femur T -1.1  . Inappropriate sinus tachycardia    BP 166/80  Pulse 124  Resp 16  Ht 5' 3.5" (1.613 m)  Wt 199 lb (90.266 kg)  BMI 34.69 kg/m2  SpO2 95%  LMP 03/08/2002    Review of Systems  Musculoskeletal: Positive for gait problem.  Neurological: Positive for dizziness, tremors, weakness and numbness.       Tingling, spasms  Psychiatric/Behavioral: Positive for confusion and dysphoric mood. The patient is nervous/anxious.   All other systems  reviewed and are negative.       Objective:   Physical Exam Constitutional: She is oriented to person, place, and time. She appears well-developed and well-nourished.  HENT:  Head: Normocephalic.  Neck: Neck supple.  Musculoskeletal: She exhibits tenderness.  Neurological: She is alert and oriented to person, place, and time.  Skin: Skin is warm and dry.  Psychiatric: She is in a good mood today.  Symmetric normal motor tone is noted throughout. Normal muscle bulk. Muscle testing reveals 5/5 muscle strength of the upper extremity, and 5/5 of the lower extremity. Full range of motion in upper and lower extremities, except deficit in knee extension of 5-10 degrees bilateral, because of pain, and mild deficit in abduction with external rotation of about 10 degrees in shoulders bilateral, also because of pain. ROM of spine is restricted.  DTR in the upper and lower extremity are present and symmetric 2+. No clonus is noted.  Patient arises from chair with difficulty. Wide based gait with crutches.        Assessment & Plan:  This is a 57 year old female with  1. Fibromyalgia  2. 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 Patient has not followed up with ortho yet.  3. Shoulder pain  4. Back pain  5. Depression ,the patient is back on her Effexor, and in a good mood today.  Plan :  Patient had x-rays of her knees done since the last visit. The x-rays showed mild arthritis in the lateral compartment of her knees. Advised the patient to exercise preferable in a pool, patient states, that she has no transportation during the week, and did not really wanted to go during the weekend. Explained to the patient that she has just mild degenerative  changes, and that strengthening the muscles around the knee, would help with her pain and take stress off her knees. Patient does not really want to do exercises. I also offered to order PT or aquatic PT, the patient declined. Recommended a referral to a pain psychologist to learn coping strategies for her pain , patient denied this too.She should take more accountability/ initiative for her deconditioned muscles and resulting pain. Refilled methadone 10mg , the dosage prescribed to her was 20 mg twice a day, and Hydrocodone 10mg  4x/day. Prescribed Robaxin 500mg  tid , instead of the flexeril, which upsets her stomach, patient has not picked up this medication  because of financial issues.  Refilled her methadone, and hydrocodone.    Follow up in 1 month.

## 2013-01-08 NOTE — Patient Instructions (Signed)
Try to stay as active as pain permits. 

## 2013-01-14 ENCOUNTER — Encounter: Payer: Self-pay | Admitting: Internal Medicine

## 2013-01-14 ENCOUNTER — Ambulatory Visit (INDEPENDENT_AMBULATORY_CARE_PROVIDER_SITE_OTHER): Payer: Medicare Other | Admitting: Internal Medicine

## 2013-01-14 VITALS — BP 113/75 | HR 90 | Temp 99.0°F | Wt 198.6 lb

## 2013-01-14 DIAGNOSIS — G894 Chronic pain syndrome: Secondary | ICD-10-CM

## 2013-01-14 DIAGNOSIS — I10 Essential (primary) hypertension: Secondary | ICD-10-CM

## 2013-01-14 DIAGNOSIS — E119 Type 2 diabetes mellitus without complications: Secondary | ICD-10-CM

## 2013-01-14 LAB — POCT URINALYSIS DIPSTICK
Bilirubin, UA: NEGATIVE
Glucose, UA: NEGATIVE
Spec Grav, UA: >=1.03
pH, UA: 5.5

## 2013-01-14 LAB — GLUCOSE, CAPILLARY: Glucose-Capillary: 111 mg/dL — ABNORMAL HIGH (ref 70–99)

## 2013-01-14 NOTE — Assessment & Plan Note (Signed)
Patient complains of pain at multiple sites, including but not limited to, right shoulder, right arm, neck, right chest, and right lower abdomen. Patient sees Dr. Hermelinda Medicus for management of her chronic pain and takes Methadone and Norco for pain as well as Amytriptyline 150 mg qhs. These medications seem to control her pain adequately. The pain that she complains of on this visit was more significant that her usual pain, but still moderately controlled with her previously listed medications. No further adjustments to pain medication regimen as the patient has had recent improvement of pain symptoms and claims that this regimen does in fact control her chronic pain.  -UA shows negative nitrites, trace intact RBC's and +1 leukocytes. Given the absence of urinary symptoms (patient denies dysuria, hematuria, and increased frequency), no further testing at this time.  -Perform CBC w/ diff and CMET to rule out causes of abdominal pain. Results pending.

## 2013-01-14 NOTE — Patient Instructions (Addendum)
General Instructions: 1. Please make an appointment for 6 weeks to 2 months.  We will call you with an abnormal results based on the lab work we have taken today. 2. Please take all medications as prescribed.  3. If you have worsening of your symptoms or new symptoms arise, please call the clinic ([phone]), or go to the ER immediately if symptoms are severe.  You have done great job in taking all your medications. I appreciate it very much. Please continue doing that.  Treatment Goals:  Goals (1 Years of Data) as of 01/14/13         As of Today 01/08/13 11/19/12 10/18/12 10/05/12     Blood Pressure    . Blood Pressure < 140/90  113/75 166/80 140/78 159/87 124/72     Result Component    . HEMOGLOBIN A1C < 7.0  6.0        . LDL CALC < 100           Weight    . Weight < 180 lb (81.647 kg)  198 lb 9.6 oz (90.084 kg) 199 lb (90.266 kg) 197 lb (89.359 kg) 192 lb (87.091 kg) 195 lb 11.2 oz (88.769 kg)      Progress Toward Treatment Goals:  Treatment Goal 01/14/2013  Hemoglobin A1C at goal  Blood pressure at goal    Self Care Goals & Plans:  Self Care Goal 01/14/2013  Manage my medications take my medicines as prescribed; bring my medications to every visit; refill my medications on time  Monitor my health check my feet daily; bring my glucose meter and log to each visit  Eat healthy foods eat baked foods instead of fried foods; eat foods that are low in salt  Be physically active find an activity I enjoy  Prevent falls -  Meeting treatment goals maintain the current self-care plan    Home Blood Glucose Monitoring 01/14/2013  Check my blood sugar no home glucose monitoring     Care Management & Community Referrals:  Referral 01/14/2013  Referrals made for care management support none needed

## 2013-01-14 NOTE — Assessment & Plan Note (Signed)
Patient claims to be very compliant with her Metformin 500 qd w/ breakfast. HbA1c repeated today, 6.0, well within target range. Continue with current regimen.

## 2013-01-14 NOTE — Progress Notes (Signed)
Subjective:   [name]   DOB: 1955-05-31 57 y.o.   MRN: [id number]  HPI: [name] is a 57 y.o. F w/ PMHx of HTN, DM II, GERD, Chronic pain/fibromyalgia, anxiety and depression, presents today w/ complaints of abdominal pain, R shoulder pain, and meck pain, that started last weekend. She describes the abdominal pain as being located in the RLQ, which radiated into her groin and into her lower back. She describes no urinary symptoms such as dysuria or hematuria. The shoulder pain started soon after the abdominal pain and was located in the R shoulder joint and radiated into her right arm and the right chest. She does no describe a pain that worsens with movement, but the presence of pain does seem to limit her passive ROM. She is still able to lift her arm over her head.  These aches and pains were worst last weekend, a severity of 8/10, now the pain is much less (4/10) and well controlled by her pain medications that she has been previously prescribed. She takes Methodadone 10 mg bid and Vicodin 10-325 q6h prn pain. She also takes Elavil at night, 150 mg. She also describes some associated nausea with the pain as well as some mild lightheadedness. She denies any other issues at this time. No fever, chills, cough, left-sided chest pain, SOB, diarrhea, vomiting, or urinary symptoms.    Past Medical History  Diagnosis Date  . Essential hypertension   . Toe fracture 2001/2002    Left pinky toe  . Diabetes mellitus 6/09  . Gastritis     Chronic, per biopsy  . Depression   . Seasonal allergic rhinitis     Spring time  . Gastroesophageal reflux disease   . Anxiety     Panic disorder  . Anemia     Anemia of chronic disease  . Osteoarthritis of both knees     Tricompartmental, worse in lateral compartment  . Osteoarthritis of both shoulders     Mild  . Fibromyalgia   . Chronic pain syndrome   . Osteopenia     DEXA 05/06/2008: L spine T -1.0, Left femur T -0.9,  Right femur T -1.1  . Inappropriate sinus tachycardia    Current Outpatient Prescriptions  Medication Sig Dispense Refill  . amitriptyline (ELAVIL) 150 MG tablet Take 1 tablet (150 mg total) by mouth at bedtime.  30 tablet  3  . baclofen (LIORESAL) 10 MG tablet Take 1 tablet (10 mg total) by mouth 3 (three) times daily as needed (muscle spasms).  30 tablet  11  . BOOSTRIX 5-2.5-18.5 injection       . diclofenac sodium (VOLTAREN) 1 % GEL Apply 2 g topically 4 (four) times daily. To your knees  3 Tube  4  . HYDROcodone-acetaminophen (NORCO) 10-325 MG per tablet Take 1 tablet by mouth every 6 (six) hours as needed. For pain  120 tablet  0  . loratadine (CLARITIN) 10 MG tablet TAKE ONE TABLET BY MOUTH ONCE DAILY  30 tablet  11  . metFORMIN (GLUCOPHAGE) 500 MG tablet Take 1 tablet (500 mg total) by mouth daily with breakfast.  30 tablet  11  . methadone (DOLOPHINE) 10 MG tablet Take 2 tablets (20 mg total) by mouth 2 (two) times daily.  120 tablet  0  . metoprolol tartrate (LOPRESSOR) 50 MG tablet Take 1 tablet (50 mg total) by mouth 2 (two) times daily.  60 tablet  11  . pantoprazole (PROTONIX) 40  MG tablet Take 1 tablet (40 mg total) by mouth daily.  90 tablet  3  . promethazine (PHENERGAN) 12.5 MG tablet Take 1 tablet (12.5 mg total) by mouth every 8 (eight) hours as needed for nausea.  30 tablet  3  . venlafaxine (EFFEXOR) 75 MG tablet Take three tabs in the morning and two at night  150 tablet  11   No current facility-administered medications for this visit.   Family History  Problem Relation Age of Onset  . Hypertension Mother   . Cerebral aneurysm Maternal Aunt   . Early death Father 15    Raod accident  . Healthy Sister   . Healthy Brother   . Healthy Daughter   . Cerebral aneurysm Maternal Grandfather   . Cerebral aneurysm Maternal Aunt   . Healthy Sister   . Healthy Sister   . Healthy Sister   . Healthy Sister   . Healthy Brother   . Healthy Brother   . Healthy Grandchild      History   Social History  . Marital Status: Married    Spouse Name: N/A    Number of Children: N/A  . Years of Education: N/A   Social History Main Topics  . Smoking status: Never Smoker   . Smokeless tobacco: Never Used  . Alcohol Use: No     Comment: Rarely.   . Drug Use: No  . Sexual Activity: No   Other Topics Concern  . None   Social History Narrative   Patients phone number is [phone]   Review of Systems: General: Denies fever, chills, diaphoresis, appetite change and fatigue.  HEENT: Denies change in vision, eye pain, redness, hearing loss, congestion, sore throat, rhinorrhea, sneezing, mouth sores, trouble swallowing, neck pain, neck stiffness and tinnitus.   Respiratory: Denies SOB, DOE, cough, chest tightness, and wheezing.   Cardiovascular: Denies chest pain, palpitations and leg swelling.  Gastrointestinal: Positive for abdominal pain (RLQ), nausea, associated w/ aches and pains. Denies vomiting,  diarrhea, constipation, blood in stool and abdominal distention.  Genitourinary: Denies dysuria, urgency, frequency, hematuria, flank pain and difficulty urinating.  Endocrine: Denies hot or cold intolerance, sweats, polyuria, polydipsia. Musculoskeletal: Positive for arthralgias and myalgias, located in the right shoulder, arm, and neck. Denies joint swelling. Currently in a wheel chair.  Skin: Denies pallor, rash and wounds.  Neurological: Positive for lightheadedness, associated w/ pain. Denies dizziness, seizures, syncope, weakness, numbness and headaches.  Hematological: Denies adenopathy,easy bruising, personal or family bleeding history.  Psychiatric/Behavioral: Denies mood changes, confusion, nervousness, sleep disturbance and agitation.   Objective:  Physical Exam: Filed Vitals:   01/14/13 0916  BP: 113/75  Pulse: 90  Temp: 99 F (37.2 C)  TempSrc: Oral  Weight: 198 lb 9.6 oz (90.084 kg)  SpO2: 99%   General: Vital signs reviewed.  Patient is a  well-developed and well-nourished, in no acute distress and cooperative with exam. Alert and oriented x3.  Head: Normocephalic and atraumatic. Nose: No erythema or drainage noted.  Turbinates normal. Mouth: No erythema, exudates, sores, or ulcerations. Moist mucus membranes. Eyes: PERRL, EOMI, conjunctivae normal, No scleral icterus.  Neck: Supple, trachea midline, normal ROM, No JVD, masses, thyromegaly, or carotid bruit present.  Cardiovascular: RRR, S1 normal, S2 normal, no murmurs, gallops, or rubs. Pulmonary/Chest: normal respiratory effort, CTAB, no wheezes, rales, or rhonchi. Abdominal: Soft. Tender to deep palpation, mostly in the right lower quadrant, non-distended, bowel sounds are normal, no masses, organomegaly, or guarding present.  GU: No CVA tenderness.  Musculoskeletal: No joint deformities, or erythema. Stiffness noted in left shoulder, w/ decreased passive ROM 2/2 pain.  Extremities: No swelling or edema,  pulses symmetric and intact bilaterally. No cyanosis or clubbing. Neurological: A&O x3, Strength is normal and symmetric bilaterally, cranial nerve II-XII are grossly intact, no focal motor deficit, sensory intact to light touch bilaterally.  Skin: Warm, dry and intact. No rashes or erythema. Psychiatric: Normal mood and affect. speech and behavior is normal. Cognition and memory are normal.   Assessment & Plan:   Please see problem-based assessment and plan.

## 2013-01-14 NOTE — Assessment & Plan Note (Signed)
Blood pressure well controlled today at 113/75. Currently taking Metoprolol 50 mg bid. Continue with this regimen at this time.

## 2013-01-14 NOTE — Assessment & Plan Note (Deleted)
Patient complains of pain at multiple sites, including but not limited to, right shoulder, right arm, neck, right chest, and right lower abdomen. Patient sees Dr. Hermelinda Medicus for management of her chronic pain and takes Methadone and Norco for pain as well as Amytriptyline 150 mg qhs. These medications seem to control her pain adequately. The pain that she complains of on this visit was more significant that her usual pain, but still moderately controlled with her previously listed medications. No further adjustments to pain medication regimen as the patient has had recent improvement of pain symptoms and claims that this regimen does in fact control her chronic pain.

## 2013-01-15 NOTE — Progress Notes (Signed)
I saw and evaluated the patient.  I personally confirmed the key portions of the history and exam documented by Dr. Fogelman and I reviewed pertinent patient test results.  The assessment, diagnosis, and plan were formulated together and I agree with the documentation in the resident's note.   

## 2013-02-06 ENCOUNTER — Encounter: Payer: Self-pay | Admitting: Physical Medicine and Rehabilitation

## 2013-02-06 ENCOUNTER — Encounter
Payer: Medicare Other | Attending: Physical Medicine and Rehabilitation | Admitting: Physical Medicine and Rehabilitation

## 2013-02-06 VITALS — BP 129/63 | HR 97 | Resp 16 | Ht 63.5 in | Wt 199.0 lb

## 2013-02-06 DIAGNOSIS — IMO0001 Reserved for inherently not codable concepts without codable children: Secondary | ICD-10-CM

## 2013-02-06 DIAGNOSIS — M170 Bilateral primary osteoarthritis of knee: Secondary | ICD-10-CM

## 2013-02-06 DIAGNOSIS — IMO0002 Reserved for concepts with insufficient information to code with codable children: Secondary | ICD-10-CM

## 2013-02-06 DIAGNOSIS — F3289 Other specified depressive episodes: Secondary | ICD-10-CM

## 2013-02-06 DIAGNOSIS — M1710 Unilateral primary osteoarthritis, unspecified knee: Secondary | ICD-10-CM

## 2013-02-06 DIAGNOSIS — M25519 Pain in unspecified shoulder: Secondary | ICD-10-CM | POA: Insufficient documentation

## 2013-02-06 DIAGNOSIS — F329 Major depressive disorder, single episode, unspecified: Secondary | ICD-10-CM

## 2013-02-06 DIAGNOSIS — M549 Dorsalgia, unspecified: Secondary | ICD-10-CM | POA: Insufficient documentation

## 2013-02-06 DIAGNOSIS — F32A Depression, unspecified: Secondary | ICD-10-CM

## 2013-02-06 MED ORDER — METHADONE HCL 10 MG PO TABS: 20.0000 mg | ORAL_TABLET | Freq: Two times a day (BID) | ORAL | Status: DC

## 2013-02-06 MED ORDER — HYDROCODONE-ACETAMINOPHEN 10-325 MG PO TABS: 1.0000 | ORAL_TABLET | Freq: Four times a day (QID) | ORAL | Status: DC | PRN

## 2013-02-06 NOTE — Patient Instructions (Signed)
Follow up with PT, to receive your TENS unit

## 2013-02-06 NOTE — Progress Notes (Signed)
Subjective:    Patient ID: Morgan Wilkerson, female    DOB: 01/13/1956, 57 y.o.   MRN: [id number]  HPI The patient complains about chronic bilateral knee and bilateral shoulder pain. The patient denies any radiation. The patient also complains about some back pain. The main problem is her knee pain.  The problem has been stable. She states, that the Voltaren gel has given her good relief. The patient is in a good mood today, she states, that she is back on her effexor. She states that her knees are hurting a little more today, because of the wet/damp weather.  Pain Inventory Average Pain 7 Pain Right Now 9 My pain is intermittent, sharp, burning and stabbing  In the last 24 hours, has pain interfered with the following? General activity 9 Relation with others 9 Enjoyment of life 10 What TIME of day is your pain at its worst? day, evening, night Sleep (in general) Fair  Pain is worse with: walking, bending, sitting, inactivity and standing Pain improves with: rest, heat/ice, medication, TENS and injections Relief from Meds: 5  Mobility walk with assistance use a cane how many minutes can you walk? 10 ability to climb steps?  no do you drive?  no Do you have any goals in this area?  yes  Function Do you have any goals in this area?  yes  Neuro/Psych weakness numbness tremor tingling trouble walking spasms dizziness confusion depression anxiety  Prior Studies Any changes since last visit?  no  Physicians involved in your care Any changes since last visit?  no   Family History  Problem Relation Age of Onset  . Hypertension Mother   . Cerebral aneurysm Maternal Aunt   . Early death Father 14    Raod accident  . Healthy Sister   . Healthy Brother   . Healthy Daughter   . Cerebral aneurysm Maternal Grandfather   . Cerebral aneurysm Maternal Aunt   . Healthy Sister   . Healthy Sister   . Healthy Sister   . Healthy Sister   . Healthy Brother   . Healthy  Brother   . Healthy Grandchild    History   Social History  . Marital Status: Married    Spouse Name: N/A    Number of Children: N/A  . Years of Education: N/A   Social History Main Topics  . Smoking status: Never Smoker   . Smokeless tobacco: Never Used  . Alcohol Use: No     Comment: Rarely.   . Drug Use: No  . Sexual Activity: No   Other Topics Concern  . None   Social History Narrative   Patients phone number is [phone]   Past Surgical History  Procedure Laterality Date  . Temporal artery biopsy / ligation  6/09    Negative for temperal arteritis  . Cystoscopy      With hydrolic bladder distension  . Cholecystectomy      for chronic acalculus cholecystitis  . Vaginal hysterectomy  2003    with oophorectomy, for dysfunctional uterine bleeding   Past Medical History  Diagnosis Date  . Essential hypertension   . Toe fracture 2001/2002    Left pinky toe  . Diabetes mellitus 6/09  . Gastritis     Chronic, per biopsy  . Depression   . Seasonal allergic rhinitis     Spring time  . Gastroesophageal reflux disease   . Anxiety     Panic disorder  . Anemia  Anemia of chronic disease  . Osteoarthritis of both knees     Tricompartmental, worse in lateral compartment  . Osteoarthritis of both shoulders     Mild  . Fibromyalgia   . Chronic pain syndrome   . Osteopenia     DEXA 05/06/2008: L spine T -1.0, Left femur T -0.9, Right femur T -1.1  . Inappropriate sinus tachycardia    BP 129/63  Pulse 97  Resp 16  Ht 5' 3.5" (1.613 m)  Wt 199 lb (90.266 kg)  BMI 34.69 kg/m2  SpO2 96%  LMP 03/08/2002     Review of Systems  Constitutional: Positive for fever and chills.  Respiratory: Positive for shortness of breath.   Gastrointestinal: Positive for abdominal pain.  Musculoskeletal: Positive for gait problem.  Neurological: Positive for dizziness, tremors, weakness and numbness.       Spasms, tingling  Psychiatric/Behavioral: Positive for  confusion and dysphoric mood. The patient is nervous/anxious.   All other systems reviewed and are negative.       Objective:   Physical Exam Constitutional: She is oriented to person, place, and time. She appears well-developed and well-nourished.  HENT:  Head: Normocephalic.  Neck: Neck supple.  Musculoskeletal: She exhibits tenderness.  Neurological: She is alert and oriented to person, place, and time.  Skin: Skin is warm and dry.  Psychiatric: She is in a good mood today.  Symmetric normal motor tone is noted throughout. Normal muscle bulk. Muscle testing reveals 5/5 muscle strength of the upper extremity, and 5/5 of the lower extremity. Full range of motion in upper and lower extremities, except deficit in knee extension of 5-10 degrees bilateral, because of pain, and mild deficit in abduction with external rotation of about 10 degrees in shoulders bilateral, also because of pain. ROM of spine is restricted.  DTR in the upper and lower extremity are present and symmetric 2+. No clonus is noted.  Patient arises from chair with difficulty. Wide based gait with crutches.        Assessment & Plan:  This is a 57 year old female with  1. Fibromyalgia  2. 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 Patient has not followed up with ortho yet.  3. Shoulder pain  4. Back pain  5. Depression ,the patient is back on her Effexor, and in a good mood today.  Plan :  Patient had x-rays of her knees done since the last visit. The x-rays showed mild arthritis in the lateral compartment of her knees. Advised the patient to exercise preferable in a pool, patient states, that she has no transportation during the week, and did not really wanted to go  during the weekend. Explained to the patient that she has just mild degenerative changes, and that strengthening the muscles around the knee, would help with her pain and take stress off her knees. Patient does not really want to do exercises. I also offered to order PT or aquatic PT, the patient declined. Recommended a referral to a pain psychologist to learn coping strategies for her pain , patient denied this too.She should take more accountability/ initiative for her deconditioned muscles and resulting pain.Patient stated that she had a TENS unit in the past, which gave her great relief, it  does not functioning any more, I ordered a new TENS today.  Refilled methadone 10mg , the dosage prescribed to her was 20 mg twice a day, and Hydrocodone 10mg  4x/day. Prescribed Robaxin 500mg  tid , instead of the flexeril, which upsets her stomach, patient has not picked up this medication because of financial issues.  Refilled her methadone, and hydrocodone.  Follow up in 1 month.

## 2013-02-15 ENCOUNTER — Encounter: Payer: Self-pay | Admitting: Internal Medicine

## 2013-02-15 ENCOUNTER — Ambulatory Visit (INDEPENDENT_AMBULATORY_CARE_PROVIDER_SITE_OTHER): Payer: Medicare Other | Admitting: Internal Medicine

## 2013-02-15 VITALS — BP 136/84 | HR 101 | Temp 98.1°F | Wt 200.0 lb

## 2013-02-15 DIAGNOSIS — K219 Gastro-esophageal reflux disease without esophagitis: Secondary | ICD-10-CM

## 2013-02-15 DIAGNOSIS — M1710 Unilateral primary osteoarthritis, unspecified knee: Secondary | ICD-10-CM

## 2013-02-15 DIAGNOSIS — R000 Tachycardia, unspecified: Secondary | ICD-10-CM

## 2013-02-15 DIAGNOSIS — M797 Fibromyalgia: Secondary | ICD-10-CM

## 2013-02-15 DIAGNOSIS — I10 Essential (primary) hypertension: Secondary | ICD-10-CM

## 2013-02-15 DIAGNOSIS — M8580 Other specified disorders of bone density and structure, unspecified site: Secondary | ICD-10-CM

## 2013-02-15 DIAGNOSIS — Z0000 Encounter for general adult medical examination without abnormal findings: Secondary | ICD-10-CM

## 2013-02-15 DIAGNOSIS — IMO0001 Reserved for inherently not codable concepts without codable children: Secondary | ICD-10-CM

## 2013-02-15 DIAGNOSIS — E119 Type 2 diabetes mellitus without complications: Secondary | ICD-10-CM

## 2013-02-15 DIAGNOSIS — I498 Other specified cardiac arrhythmias: Secondary | ICD-10-CM

## 2013-02-15 DIAGNOSIS — M170 Bilateral primary osteoarthritis of knee: Secondary | ICD-10-CM

## 2013-02-15 DIAGNOSIS — Z23 Encounter for immunization: Secondary | ICD-10-CM

## 2013-02-15 DIAGNOSIS — G894 Chronic pain syndrome: Secondary | ICD-10-CM

## 2013-02-15 DIAGNOSIS — M899 Disorder of bone, unspecified: Secondary | ICD-10-CM

## 2013-02-15 MED ORDER — RANITIDINE HCL 300 MG PO TABS: 300.0000 mg | ORAL_TABLET | Freq: Every day | ORAL | Status: DC

## 2013-02-15 NOTE — Assessment & Plan Note (Signed)
Unfortunately the DEXA scan that was ordered at the last clinic visit was not scheduled. Morgan Wilkerson is due for an assessment and is interested in completing the study at this time. Therefore we will try to schedule an appointment for a DEXA scan to assess her for progression to osteoporosis.

## 2013-02-15 NOTE — Assessment & Plan Note (Addendum)
She is followed closely in the pain clinic for her chronic pain syndrome. They continue to prescribe hydrocodone and methadone for this. She will continue current followup and management of her chronic pain syndrome in the chronic pain clinic. With regards to her chronic low back pain she has responded to the baclofen therapy and was encouraged to use it as needed when her symptoms worsened.

## 2013-02-15 NOTE — Patient Instructions (Signed)
It was good to see you again.  You are doing a wonderful job with your diabetes and your blood pressure!  I am very proud of you.  1) Stop the metoprolol and the metformin as you no longer need them.  2) Stop the pantoprazole and start ranitidine 300 mg by mouth each night.  3) We will make referrals to Ophthalmology and for a colonoscopy.  4) We will try to schedule a DEXA scan (bone test).  5) We gave you a pneumovax today.  You decided against the flu shot at this time.  6) Please keep taking your other medications as prescribed.  I will see you back in 6 months, sooner if necessary.

## 2013-02-15 NOTE — Progress Notes (Signed)
  Subjective:    Patient ID: Morgan Wilkerson, female    DOB: 06/12/1955, 57 y.o.   MRN: [id number]  HPI  Please see the A&P for the status of the pt's chronic medical problems.  Review of Systems  Constitutional: Negative for activity change, appetite change and unexpected weight change.  Respiratory: Negative for chest tightness, shortness of breath and wheezing.   Cardiovascular: Negative for chest pain, palpitations and leg swelling.  Gastrointestinal: Positive for nausea and abdominal pain. Negative for vomiting, diarrhea and abdominal distention.  Musculoskeletal: Positive for arthralgias, back pain, gait problem and myalgias. Negative for joint swelling.  Skin: Negative for rash and wound.  Neurological: Positive for headaches.      Objective:   Physical Exam  Nursing note and vitals reviewed. Constitutional: She is oriented to person, place, and time. She appears well-developed and well-nourished. No distress.  HENT:  Head: Normocephalic and atraumatic.  Eyes: Conjunctivae are normal. Right eye exhibits no discharge. Left eye exhibits no discharge. No scleral icterus.  Neck: Neck supple.  Cardiovascular: Normal rate, regular rhythm and normal heart sounds.  Exam reveals no gallop and no friction rub.   No murmur heard. Pulmonary/Chest: Effort normal and breath sounds normal. No respiratory distress. She has no wheezes. She has no rales.  Abdominal: Soft. Bowel sounds are normal. She exhibits no distension. There is tenderness. There is no rebound and no guarding.  Musculoskeletal: Normal range of motion. She exhibits tenderness. She exhibits no edema.  Neurological: She is alert and oriented to person, place, and time. She exhibits normal muscle tone.  Skin: Skin is warm and dry. No rash noted. She is not diaphoretic. No erythema.  Psychiatric: She has a normal mood and affect. Her behavior is normal. Judgment and thought content normal.      Assessment & Plan:   Please see  problem oriented charting.

## 2013-02-15 NOTE — Assessment & Plan Note (Signed)
She has been unable to afford her metoprolol. Despite this her blood pressure is at target without any pharmacotherapy. We will therefore discontinue the metoprolol as she is normotensive on no therapy. She was asked to continue the lifestyle changes she is adopted in order to improve her blood pressure control.

## 2013-02-15 NOTE — Assessment & Plan Note (Signed)
Her gastroesophageal reflux symptoms are intermittent. She currently takes pantoprazole on an as-needed basis. Given her tight financial circumstances we switched her to ranitidine 300 mg by mouth at night as needed for acid reflux symptoms. We will reassess her response to this therapy at the followup visit.

## 2013-02-15 NOTE — Assessment & Plan Note (Signed)
Her pulse today despite not taking her beta blocker was 101. This is close to target. We will therefore continue to follow her sinus tachycardia at subsequent visits. At this time there is no need for therapy.

## 2013-02-15 NOTE — Assessment & Plan Note (Signed)
Her fibromyalgia symptoms are stable on the amitriptyline and venlafaxine. We will continue with this therapy.

## 2013-02-15 NOTE — Assessment & Plan Note (Addendum)
She was not interested in getting the flu shot today. Nonetheless, she was interested in receiving her Pneumovax and this was provided for her today. A referral was also made for a colonoscopy which she is interested in.

## 2013-02-15 NOTE — Assessment & Plan Note (Signed)
She has been off of her metformin for several months. Despite this her hemoglobin A1c 1 month ago was 6.0. Therefore her diabetes is well controlled on diet alone. We will continue with this therapy. A referral was made for an ophthalmologic exam and she completed her diabetic foot exam today. She was also provided with a Pneumovax given her diabetes. Her blood pressure is at target and her lipids, less than half a year ago, were also at target. We will continue to monitor periodic hemoglobin A1c's to assure that her diabetes remains controlled on diet therapy alone.

## 2013-02-15 NOTE — Assessment & Plan Note (Signed)
Her tricompartmental osteoarthritis of the knees bilaterally is reasonably well controlled with Voltaren gel. We will continue with this therapy.

## 2013-02-21 ENCOUNTER — Ambulatory Visit (HOSPITAL_COMMUNITY): Payer: Medicare Other | Attending: Internal Medicine

## 2013-02-28 ENCOUNTER — Telehealth: Payer: Self-pay

## 2013-02-28 NOTE — Telephone Encounter (Signed)
Need specific required info from "court" as to why and what needs to be stated in letter

## 2013-02-28 NOTE — Telephone Encounter (Signed)
Patient needs a letter for court regarding her disability.

## 2013-02-28 NOTE — Telephone Encounter (Signed)
Tried to contact patient but voicemail is full 

## 2013-03-06 ENCOUNTER — Encounter
Payer: Medicare Other | Attending: Physical Medicine and Rehabilitation | Admitting: Physical Medicine and Rehabilitation

## 2013-03-06 ENCOUNTER — Encounter: Payer: Self-pay | Admitting: Physical Medicine and Rehabilitation

## 2013-03-06 VITALS — BP 168/95 | HR 118 | Resp 16 | Ht 63.5 in | Wt 197.0 lb

## 2013-03-06 DIAGNOSIS — IMO0001 Reserved for inherently not codable concepts without codable children: Secondary | ICD-10-CM

## 2013-03-06 DIAGNOSIS — M170 Bilateral primary osteoarthritis of knee: Secondary | ICD-10-CM

## 2013-03-06 DIAGNOSIS — M1710 Unilateral primary osteoarthritis, unspecified knee: Secondary | ICD-10-CM

## 2013-03-06 DIAGNOSIS — M25519 Pain in unspecified shoulder: Secondary | ICD-10-CM | POA: Insufficient documentation

## 2013-03-06 DIAGNOSIS — F329 Major depressive disorder, single episode, unspecified: Secondary | ICD-10-CM

## 2013-03-06 DIAGNOSIS — M797 Fibromyalgia: Secondary | ICD-10-CM

## 2013-03-06 DIAGNOSIS — Z5181 Encounter for therapeutic drug level monitoring: Secondary | ICD-10-CM

## 2013-03-06 DIAGNOSIS — M549 Dorsalgia, unspecified: Secondary | ICD-10-CM | POA: Insufficient documentation

## 2013-03-06 DIAGNOSIS — F32A Depression, unspecified: Secondary | ICD-10-CM

## 2013-03-06 DIAGNOSIS — F3289 Other specified depressive episodes: Secondary | ICD-10-CM | POA: Insufficient documentation

## 2013-03-06 DIAGNOSIS — Z79899 Other long term (current) drug therapy: Secondary | ICD-10-CM

## 2013-03-06 MED ORDER — METHADONE HCL 10 MG PO TABS: 20.0000 mg | ORAL_TABLET | Freq: Two times a day (BID) | ORAL | Status: DC

## 2013-03-06 MED ORDER — HYDROCODONE-ACETAMINOPHEN 10-325 MG PO TABS: 1.0000 | ORAL_TABLET | Freq: Four times a day (QID) | ORAL | Status: DC | PRN

## 2013-03-06 NOTE — Patient Instructions (Signed)
Try to stay as active as tolerated 

## 2013-03-06 NOTE — Addendum Note (Signed)
Addended by: Doreene Eland on: 03/06/2013 09:40 AM   Modules accepted: Orders

## 2013-03-06 NOTE — Progress Notes (Signed)
Subjective:    Patient ID: Morgan Wilkerson, female    DOB: 1955/12/19, 57 y.o.   MRN: [id number]  HPI The patient complains about chronic bilateral knee and bilateral shoulder pain. The patient denies any radiation. The patient also complains about some back pain. The main problem is her knee pain.  The problem has been stable. She states, that the Voltaren gel has given her good relief. The patient is in a good mood today, she states, that she is back on her effexor. She states that her knees are hurting a little more today, because of the wet/damp weather  Pain Inventory Average Pain 8 Pain Right Now 9 My pain is constant, sharp and stabbing  In the last 24 hours, has pain interfered with the following? General activity 8 Relation with others 9 Enjoyment of life 9 What TIME of day is your pain at its worst? all day Sleep (in general) Fair  Pain is worse with: walking, bending, sitting, inactivity, standing and other Pain improves with: rest, heat/ice, medication, TENS and injections Relief from Meds: 9  Mobility walk with assistance how many minutes can you walk? 20 ability to climb steps?  no do you drive?  no Do you have any goals in this area?  yes  Function disabled: date disabled na I need assistance with the following:  dressing, bathing, toileting, meal prep, household duties and shopping Do you have any goals in this area?  yes  Neuro/Psych bladder control problems weakness numbness tremor tingling trouble walking spasms dizziness confusion depression anxiety  Prior Studies Any changes since last visit?  no  Physicians involved in your care Any changes since last visit?  no   Family History  Problem Relation Age of Onset  . Hypertension Mother   . Cerebral aneurysm Maternal Aunt   . Early death Father 84    Raod accident  . Healthy Sister   . Healthy Brother   . Healthy Daughter   . Cerebral aneurysm Maternal Grandfather   . Cerebral aneurysm  Maternal Aunt   . Healthy Sister   . Healthy Sister   . Healthy Sister   . Healthy Sister   . Healthy Brother   . Healthy Brother   . Healthy Grandchild    History   Social History  . Marital Status: Married    Spouse Name: N/A    Number of Children: N/A  . Years of Education: N/A   Social History Main Topics  . Smoking status: Never Smoker   . Smokeless tobacco: Never Used  . Alcohol Use: No     Comment: Rarely.   . Drug Use: No  . Sexual Activity: No   Other Topics Concern  . None   Social History Narrative   Patients phone number is [phone]   Past Surgical History  Procedure Laterality Date  . Temporal artery biopsy / ligation  6/09    Negative for temperal arteritis  . Cystoscopy      With hydrolic bladder distension  . Cholecystectomy      for chronic acalculus cholecystitis  . Vaginal hysterectomy  2003    with oophorectomy, for dysfunctional uterine bleeding   Past Medical History  Diagnosis Date  . Essential hypertension   . Toe fracture 2001/2002    Left pinky toe  . Diabetes mellitus 6/09  . Gastritis     Chronic, per biopsy  . Depression   . Seasonal allergic rhinitis     Spring time  . Gastroesophageal  reflux disease   . Anxiety     Panic disorder  . Anemia     Anemia of chronic disease  . Osteoarthritis of both knees     Tricompartmental, worse in lateral compartment  . Osteoarthritis of both shoulders     Mild  . Fibromyalgia   . Chronic pain syndrome   . Osteopenia     DEXA 05/06/2008: L spine T -1.0, Left femur T -0.9, Right femur T -1.1  . Inappropriate sinus tachycardia    BP 168/95  Pulse 118  Resp 16  Ht 5' 3.5" (1.613 m)  Wt 197 lb (89.359 kg)  BMI 34.35 kg/m2  SpO2 96%  LMP 03/08/2002     Review of Systems  Constitutional: Positive for unexpected weight change.  Respiratory: Positive for shortness of breath.   Cardiovascular: Positive for leg swelling.  Gastrointestinal: Positive for nausea and  constipation.  Genitourinary:       Bladder control problems  Musculoskeletal: Positive for gait problem and myalgias.  Neurological: Positive for dizziness, tremors, weakness and numbness.       Tingling, spasms  Psychiatric/Behavioral: Positive for confusion and dysphoric mood. The patient is nervous/anxious.   All other systems reviewed and are negative.       Objective:   Physical Exam Constitutional: She is oriented to person, place, and time. She appears well-developed and well-nourished.  HENT:  Head: Normocephalic.  Neck: Neck supple.  Musculoskeletal: She exhibits tenderness.  Neurological: She is alert and oriented to person, place, and time.  Skin: Skin is warm and dry.  Psychiatric: She is in a good mood today.  Symmetric normal motor tone is noted throughout. Normal muscle bulk. Muscle testing reveals 5/5 muscle strength of the upper extremity, and 5/5 of the lower extremity. Full range of motion in upper and lower extremities, except deficit in knee extension of 5-10 degrees bilateral, because of pain, and mild deficit in abduction with external rotation of about 10 degrees in shoulders bilateral, also because of pain. ROM of spine is restricted.  DTR in the upper and lower extremity are present and symmetric 2+. No clonus is noted.  Patient arises from chair with difficulty. Wide based gait with crutches.        Assessment & Plan:  This is a 57 year old female with  1. Fibromyalgia  2. 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 Patient has not followed up with ortho yet.  3. Shoulder pain  4. Back pain  5. Depression ,the patient is back on her Effexor, and in a good mood today.  Plan :  Patient had x-rays of her  knees done since the last visit. The x-rays showed mild arthritis in the lateral compartment of her knees. Advised the patient to exercise preferable in a pool, patient states, that she has no transportation during the week, and did not really wanted to go during the weekend. Explained to the patient that she has just mild degenerative changes, and that strengthening the muscles around the knee, would help with her pain and take stress off her knees. Patient does not really want to do exercises. I also offered to order PT or aquatic PT, the patient declined. Recommended a referral to a pain psychologist to  learn coping strategies for her pain , patient denied this too.She should take more accountability/ initiative for her deconditioned muscles and resulting pain.Patient stated that she had a TENS unit in the past, which gave her great relief, it does not functioning any more, I ordered a new TENSat the last visit, but the patient states that she was never called, I ordered the TENS again today.  Refilled methadone 10mg , the dosage prescribed to her was 20 mg twice a day, and Hydrocodone 10mg  4x/day. Prescribed Robaxin 500mg  tid , instead of the flexeril, which upsets her stomach, patient has not picked up this medication because of financial issues.  Gave patient a letter , which said that she would not be able to do jury duty, because of her medical condition, she can not sit for a prolonged time, without increasing of her pain.  Follow up in 1 month.

## 2013-03-07 ENCOUNTER — Ambulatory Visit: Payer: Medicare Other | Admitting: Physical Medicine and Rehabilitation

## 2013-03-11 ENCOUNTER — Encounter: Payer: Self-pay | Admitting: *Deleted

## 2013-03-20 NOTE — Addendum Note (Signed)
Addended by: Remus Blake on: 03/20/2013 08:44 AM   Modules accepted: Orders

## 2013-03-26 ENCOUNTER — Ambulatory Visit: Payer: Medicare Other | Admitting: Physical Therapy

## 2013-04-02 ENCOUNTER — Ambulatory Visit: Payer: Medicare Other

## 2013-04-03 ENCOUNTER — Encounter: Payer: Medicare Other | Admitting: Physical Medicine and Rehabilitation

## 2013-04-16 ENCOUNTER — Telehealth: Payer: Self-pay | Admitting: *Deleted

## 2013-04-16 NOTE — Telephone Encounter (Signed)
CALLED [facility], LEFT MESSAGE WITH HUSBAND FOR HER TO CALL THE CLINIC.  I NEED TO KNOW IF PATIENT CALL THE OFFICE OF [facility] AS REQUESTED. UNABLE TO MAKE REFERRAL AT THIS TIME.  Nikolis Berent NTII [phone]

## 2013-04-17 ENCOUNTER — Ambulatory Visit: Payer: Medicare Other | Admitting: Physical Therapy

## 2013-04-22 ENCOUNTER — Other Ambulatory Visit: Payer: Self-pay | Admitting: *Deleted

## 2013-04-22 ENCOUNTER — Ambulatory Visit: Payer: Medicare Other | Attending: Physical Therapy | Admitting: Physical Therapy

## 2013-04-22 DIAGNOSIS — IMO0001 Reserved for inherently not codable concepts without codable children: Secondary | ICD-10-CM

## 2013-04-22 DIAGNOSIS — M170 Bilateral primary osteoarthritis of knee: Secondary | ICD-10-CM

## 2013-04-22 DIAGNOSIS — F329 Major depressive disorder, single episode, unspecified: Secondary | ICD-10-CM

## 2013-04-22 MED ORDER — METHADONE HCL 10 MG PO TABS: 20.0000 mg | ORAL_TABLET | Freq: Two times a day (BID) | ORAL | Status: DC

## 2013-04-22 MED ORDER — HYDROCODONE-ACETAMINOPHEN 10-325 MG PO TABS: 1.0000 | ORAL_TABLET | Freq: Four times a day (QID) | ORAL | Status: DC | PRN

## 2013-04-22 NOTE — Telephone Encounter (Signed)
RX printed early for controlled medication for the visit with RN on 04/25/13 (to be signed by MD)

## 2013-04-25 ENCOUNTER — Encounter: Payer: Medicare Other | Attending: Physical Medicine & Rehabilitation | Admitting: *Deleted

## 2013-04-25 ENCOUNTER — Ambulatory Visit: Payer: Medicare Other | Admitting: Physical Medicine and Rehabilitation

## 2013-04-25 ENCOUNTER — Encounter: Payer: Self-pay | Admitting: *Deleted

## 2013-04-25 VITALS — BP 168/77 | HR 112 | Resp 14

## 2013-04-25 DIAGNOSIS — M1710 Unilateral primary osteoarthritis, unspecified knee: Secondary | ICD-10-CM | POA: Insufficient documentation

## 2013-04-25 DIAGNOSIS — M549 Dorsalgia, unspecified: Secondary | ICD-10-CM | POA: Insufficient documentation

## 2013-04-25 DIAGNOSIS — M797 Fibromyalgia: Secondary | ICD-10-CM

## 2013-04-25 DIAGNOSIS — M25519 Pain in unspecified shoulder: Secondary | ICD-10-CM | POA: Insufficient documentation

## 2013-04-25 DIAGNOSIS — M19011 Primary osteoarthritis, right shoulder: Secondary | ICD-10-CM

## 2013-04-25 DIAGNOSIS — G894 Chronic pain syndrome: Secondary | ICD-10-CM

## 2013-04-25 DIAGNOSIS — M170 Bilateral primary osteoarthritis of knee: Secondary | ICD-10-CM

## 2013-04-25 DIAGNOSIS — IMO0001 Reserved for inherently not codable concepts without codable children: Secondary | ICD-10-CM | POA: Insufficient documentation

## 2013-04-25 NOTE — Progress Notes (Signed)
Here for pill count and medication refills. hydrodocodone 10-325 # 120 Fill date 03/14/13     Today NV# 0  , methadone 10 mg 2 tab bid # 120  Fill date 03/14/13   NV#0  Has had to stretch the doses out because  Of appt date.  No changes except she has been taken off the diabetic meds.  One month follow up for pill count and med refill

## 2013-04-25 NOTE — Patient Instructions (Signed)
Please schedule one month follow up with RN for med management  

## 2013-05-17 ENCOUNTER — Encounter: Payer: Medicare Other | Admitting: Internal Medicine

## 2013-05-20 NOTE — Addendum Note (Signed)
Addended by: Hulan Fray on: 05/20/2013 07:07 PM   Modules accepted: Orders

## 2013-05-22 ENCOUNTER — Other Ambulatory Visit: Payer: Self-pay | Admitting: *Deleted

## 2013-05-22 DIAGNOSIS — IMO0001 Reserved for inherently not codable concepts without codable children: Secondary | ICD-10-CM

## 2013-05-22 DIAGNOSIS — F32A Depression, unspecified: Secondary | ICD-10-CM

## 2013-05-22 DIAGNOSIS — M170 Bilateral primary osteoarthritis of knee: Secondary | ICD-10-CM

## 2013-05-22 DIAGNOSIS — F329 Major depressive disorder, single episode, unspecified: Secondary | ICD-10-CM

## 2013-05-22 MED ORDER — METHADONE HCL 10 MG PO TABS: 20.0000 mg | ORAL_TABLET | Freq: Two times a day (BID) | ORAL | Status: DC

## 2013-05-22 MED ORDER — HYDROCODONE-ACETAMINOPHEN 10-325 MG PO TABS: 1.0000 | ORAL_TABLET | Freq: Four times a day (QID) | ORAL | Status: DC | PRN

## 2013-05-22 NOTE — Telephone Encounter (Signed)
RX printed early for controlled medication for the visit with RN on 05/24/13 (to be signed by MD)

## 2013-05-24 ENCOUNTER — Encounter: Payer: Medicare Other | Admitting: *Deleted

## 2013-05-27 ENCOUNTER — Encounter: Payer: Self-pay | Admitting: *Deleted

## 2013-05-27 ENCOUNTER — Encounter: Payer: Medicare Other | Attending: Physical Medicine & Rehabilitation | Admitting: *Deleted

## 2013-05-27 VITALS — BP 177/80 | HR 113 | Resp 14 | Wt 196.0 lb

## 2013-05-27 DIAGNOSIS — F32A Depression, unspecified: Secondary | ICD-10-CM

## 2013-05-27 DIAGNOSIS — M797 Fibromyalgia: Secondary | ICD-10-CM

## 2013-05-27 DIAGNOSIS — IMO0001 Reserved for inherently not codable concepts without codable children: Secondary | ICD-10-CM | POA: Insufficient documentation

## 2013-05-27 DIAGNOSIS — M19012 Primary osteoarthritis, left shoulder: Secondary | ICD-10-CM

## 2013-05-27 DIAGNOSIS — M25519 Pain in unspecified shoulder: Secondary | ICD-10-CM | POA: Insufficient documentation

## 2013-05-27 DIAGNOSIS — M1710 Unilateral primary osteoarthritis, unspecified knee: Secondary | ICD-10-CM | POA: Insufficient documentation

## 2013-05-27 DIAGNOSIS — F329 Major depressive disorder, single episode, unspecified: Secondary | ICD-10-CM

## 2013-05-27 DIAGNOSIS — M19011 Primary osteoarthritis, right shoulder: Secondary | ICD-10-CM

## 2013-05-27 DIAGNOSIS — M549 Dorsalgia, unspecified: Secondary | ICD-10-CM | POA: Insufficient documentation

## 2013-05-27 NOTE — Patient Instructions (Signed)
Follow up one month with Dr Naaman Plummer or Algis Liming

## 2013-05-27 NOTE — Progress Notes (Signed)
Here for pill count and medication refills.  Methadone rx was dispensed from pharmacy  #110   And should have been #120 (pharmacy did not have full # in stock)  [place] date 04/27/13     Today NV# 0  Hydrocodone # 120 04/27/13 Today NV# 10  Pill counts are appropriate.  VSS    Pain level:  7  Opioid risk score is a 2 . Refills given for methadone and hydrocodone .  Return in one month to see Dr Naaman Plummer or Algis Liming PA

## 2013-06-11 ENCOUNTER — Encounter: Payer: Self-pay | Admitting: Internal Medicine

## 2013-06-11 ENCOUNTER — Telehealth: Payer: Self-pay

## 2013-06-11 ENCOUNTER — Ambulatory Visit (HOSPITAL_COMMUNITY)
Admission: RE | Admit: 2013-06-11 | Discharge: 2013-06-11 | Disposition: A | Discharge status: Home / Self Care | Payer: Medicare Other | Source: Ambulatory Visit | Attending: Internal Medicine | Admitting: Internal Medicine

## 2013-06-11 ENCOUNTER — Ambulatory Visit (INDEPENDENT_AMBULATORY_CARE_PROVIDER_SITE_OTHER): Payer: Medicare Other | Admitting: Internal Medicine

## 2013-06-11 VITALS — BP 168/93 | HR 132 | Temp 98.7°F | Ht 63.5 in | Wt 196.5 lb

## 2013-06-11 DIAGNOSIS — R9431 Abnormal electrocardiogram [ECG] [EKG]: Secondary | ICD-10-CM | POA: Insufficient documentation

## 2013-06-11 DIAGNOSIS — J302 Other seasonal allergic rhinitis: Secondary | ICD-10-CM

## 2013-06-11 DIAGNOSIS — I10 Essential (primary) hypertension: Secondary | ICD-10-CM

## 2013-06-11 DIAGNOSIS — J309 Allergic rhinitis, unspecified: Secondary | ICD-10-CM

## 2013-06-11 DIAGNOSIS — R69 Illness, unspecified: Principal | ICD-10-CM

## 2013-06-11 DIAGNOSIS — I4711 Inappropriate sinus tachycardia, so stated: Secondary | ICD-10-CM

## 2013-06-11 DIAGNOSIS — I498 Other specified cardiac arrhythmias: Secondary | ICD-10-CM

## 2013-06-11 DIAGNOSIS — K219 Gastro-esophageal reflux disease without esophagitis: Secondary | ICD-10-CM

## 2013-06-11 DIAGNOSIS — R000 Tachycardia, unspecified: Secondary | ICD-10-CM

## 2013-06-11 DIAGNOSIS — E119 Type 2 diabetes mellitus without complications: Secondary | ICD-10-CM

## 2013-06-11 DIAGNOSIS — J111 Influenza due to unidentified influenza virus with other respiratory manifestations: Secondary | ICD-10-CM

## 2013-06-11 DIAGNOSIS — M8580 Other specified disorders of bone density and structure, unspecified site: Secondary | ICD-10-CM

## 2013-06-11 DIAGNOSIS — IMO0001 Reserved for inherently not codable concepts without codable children: Secondary | ICD-10-CM

## 2013-06-11 DIAGNOSIS — M797 Fibromyalgia: Secondary | ICD-10-CM

## 2013-06-11 DIAGNOSIS — G894 Chronic pain syndrome: Secondary | ICD-10-CM

## 2013-06-11 DIAGNOSIS — Z0000 Encounter for general adult medical examination without abnormal findings: Secondary | ICD-10-CM

## 2013-06-11 LAB — GLUCOSE, CAPILLARY: Glucose-Capillary: 147 mg/dL — ABNORMAL HIGH (ref 70–99)

## 2013-06-11 MED ORDER — OSELTAMIVIR PHOSPHATE 75 MG PO CAPS: 75.0000 mg | ORAL_CAPSULE | Freq: Two times a day (BID) | ORAL | Status: AC

## 2013-06-11 NOTE — Assessment & Plan Note (Signed)
Patient does not feel that she needs ranitidine thus has not been taking this medication.  No GERD symptoms reported toda.

## 2013-06-11 NOTE — Telephone Encounter (Signed)
Dr. Stann Mainland called and patient was seen today for flu symptoms. While in the clinic a EKG was done and showed QT intervals were prolonged. Patient was instructed to discontinue taking amitripylene and also not to take phenergan which she has not been taking. Dr. Stann Mainland wanted to make you aware of this due to patient taking Methadone.

## 2013-06-11 NOTE — Assessment & Plan Note (Signed)
Patient's fibromyalgia previously well-controlled on amitriptyline and venlafaxine.  She self-discontinued amitriptyline about 1 week ago.  Given QT prolongation seen on EKG today, asked her to continue NOT taking amitriptyline though she should continue venlafaxine.

## 2013-06-11 NOTE — Assessment & Plan Note (Addendum)
EKG today showed QT prolongation to 625 ms.  Only one prior EKG in system from 2011 which showed QT of 467.  Patient has multiple QT prolonging medications on her list including methadone, amitriptyline, phenergan.  She had self-discontinued her amitriptylene about 1 week ago thus was instructed to continue NOT taking it.  She has also not been taking phenergan, was instructed to continue NOT taking this as well.  Called and spoke to nurse Lexine Baton at Dr. Candise Che office (pain clinic that prescribes patient's methadone) who will let Dr. Tessa Lerner know.   Patient to follow-up next week, will repeat EKG at that time.  Of note, Tamiflu, which was prescribed today, does not prolong QT.

## 2013-06-11 NOTE — Assessment & Plan Note (Signed)
A1C 6.0% in 01/2013.  Patient currently taking metformin on a prn basis "when I eat sweets."  Need to readdress the use of  This medication at follow-up visit.   Unclear if patient has seen ophthalmologist, will inquire at follow-up visit next week and may complete retinal scan in clinic at that time.

## 2013-06-11 NOTE — Assessment & Plan Note (Addendum)
Patient no longer taking Claritin due to financial constraints, does not feel that she needs it.

## 2013-06-11 NOTE — Assessment & Plan Note (Signed)
HR 132 on presentation today; she had been exerting herself to walk here on crutches prior to this measurement.  Patient has a history of sinus tachycardia.  On EKG, HR 116 which is near patient's baseline off metoprolol (which she was previously taking).

## 2013-06-11 NOTE — Assessment & Plan Note (Addendum)
See HPI for details.  Patient has likely influenza infection with cough, sore throat, myalgia, subjective fevers.  She elected not to get flu vaccine this year.  Unable to perform flu swab in clinic per RN.  Will treat with Tamiflu 75 mg BID x 5 days and follow-up in clinic next week.

## 2013-06-11 NOTE — Assessment & Plan Note (Addendum)
Retinal scan at follow-up visit next week if patient has not seen ophthalmologist.    Referral made for colonoscopy, mammogram and repeat Dexa scan ordered at last visit; does not appear that patient has had any of these.

## 2013-06-11 NOTE — Patient Instructions (Addendum)
Follow-up in [facility] next week so we can be sure you are doing better.   Please take all of your medicines as prescribed.  Since you have not been taking ELAVIL for a week, you should not continue taking it.  Also please do not take PHENERGAN (which you have not been taking either).  We will repeat an EKG next week at your follow-up visit.   You may have the flu.  We are prescribing you a medicine called TAMIFLU to help shorten the length of your symptoms, it will be waiting on you at [facility].  You will take this twice daily for 5 days.  You should also buy TYLENOL over the counter to help with your discomfort and aches.  You can take Tylenol 650 mg three times daily as needed until you see [place] back in clinic next week (in addition to your regular pain medicines).    Hope you feel better soon!  Oseltamivir capsules What is this medicine? OSELTAMIVIR (os el TAM i vir) is an antiviral medicine. It is used to prevent and to treat some kinds of influenza or the flu. It will not work for colds or other viral infections. This medicine may be used for other purposes; ask your health care provider or pharmacist if you have questions. COMMON BRAND NAME(S): Tamiflu What should I tell my health care provider before I take this medicine? They need to know if you have any of the following conditions: -heart disease -immune system problems -kidney disease -liver disease -lung disease -an unusual or allergic reaction to oseltamivir, other medicines, foods, dyes, or preservatives -pregnant or trying to get pregnant -breast-feeding How should I use this medicine? Take this medicine by mouth with a glass of water. Follow the directions on the prescription label. Start this medicine at the first sign of flu symptoms. You can take it with or without food. If it upsets your stomach, take it with food. Take your medicine at regular intervals. Do not take your medicine more often than directed. Take all of your medicine  as directed even if you think you are better. Do not skip doses or stop your medicine early. Talk to your pediatrician regarding the use of this medicine in children. While this drug may be prescribed for children as young as 14 days for selected conditions, precautions do apply. Overdosage: If you think you have taken too much of this medicine contact a poison control center or emergency room at once. NOTE: This medicine is only for you. Do not share this medicine with others. What if I miss a dose? If you miss a dose, take it as soon as you remember. If it is almost time for your next dose (within 2 hours), take only that dose. Do not take double or extra doses. What may interact with this medicine? Interactions are not expected. This list may not describe all possible interactions. Give your health care provider a list of all the medicines, herbs, non-prescription drugs, or dietary supplements you use. Also tell them if you smoke, drink alcohol, or use illegal drugs. Some items may interact with your medicine. What should I watch for while using this medicine? Visit your doctor or health care professional for regular check ups. Tell your doctor if your symptoms do not start to get better or if they get worse. If you have the flu, you may be at an increased risk of developing seizures, confusion, or abnormal behavior. This occurs early in the illness, and more frequently  in children and teens. These events are not common, but may result in accidental injury to the patient. Families and caregivers of patients should watch for signs of unusual behavior and contact a doctor or health care professional right away if the patient shows signs of unusual behavior. This medicine is not a substitute for the flu shot. Talk to your doctor each year about an annual flu shot. What side effects may I notice from receiving this medicine? Side effects that you should report to your doctor or health care professional  as soon as possible: -allergic reactions like skin rash, itching or hives, swelling of the face, lips, or tongue -anxiety, confusion, unusual behavior -breathing problems -hallucination, loss of contact with reality -redness, blistering, peeling or loosening of the skin, including inside the mouth -seizures Side effects that usually do not require medical attention (report to your doctor or health care professional if they continue or are bothersome): -cough -diarrhea -dizziness -headache -nausea, vomiting -stomach pain This list may not describe all possible side effects. Call your doctor for medical advice about side effects. You may report side effects to FDA at 1-800-FDA-1088. Where should I keep my medicine? Keep out of the reach of children. Store at room temperature between 15 and 30 degrees C (59 and 86 degrees F). Throw away any unused medicine after the expiration date. NOTE: This sheet is a summary. It may not cover all possible information. If you have questions about this medicine, talk to your doctor, pharmacist, or health care provider.  2014, Elsevier/Gold Standard. (2011-04-29 19:43:38)

## 2013-06-11 NOTE — Progress Notes (Addendum)
Patient ID: Morgan Wilkerson, female   DOB: [birth year], 58 y.o.   MRN: [id number]   Subjective:   [name]   DOB: 28-Jan-1956 58 y.o.   MRN: [id number]  HPI: [name] is a 58 y.o. woman with history of controlled DM2, HTN, inappropriate sinus tachycardia, GERD, fibromyalgia, OA, chronic pain who presents for acute visit for sore throat.   Patient states she started feeling poorly at the end of last week, maybe Thursday 1/29.  She was having sore throat, congestion, and cough.  She purchased Delsym over the weekend which she started taking on Sunday and feels has loosed up her congestion and caused her to start coughing more.  She was coughing up "dark" phlegm on Sunday 2/1 (?green in color) but phlegm is now clear (since 2/2).  She has been having subjective fevers and chills for the past 2 days, took her temperature at home last night which was reportedly 102.20F.  She states that she feels very hot today, but temperature on presentation in clinic 98.74F.  She has not taken any Tylenol other than what is in her Vicodin that she took yesterday evening.  She has chronic all-over pain but states that this feels a little different.  She reports good PO intake at home, ate white rice and scrambled eggs last night for dinner.  She has not been taking Claritan due to financial constraints.  Of note, she elected not to get a flu shot this year.  No sick contacts.    She has not been taking amitriptyline for past week because she felt "it makes her sleep too deeply" when she isn't feeling well.  She has also not been taking phenergan or baclofen for some time as she has not needed these medications.  She takes metformin prn "when I eat sweets."  (Her only regular medications are methadone, Vicodin, and venlafaxine.)   Past Medical History  Diagnosis Date  . Essential hypertension   . Toe fracture 2001/2002    Left pinky toe  . Diabetes mellitus 6/09  . Gastritis     Chronic, per  biopsy  . Depression   . Seasonal allergic rhinitis     Spring time  . Gastroesophageal reflux disease   . Anxiety     Panic disorder  . Anemia     Anemia of chronic disease  . Osteoarthritis of both knees     Tricompartmental, worse in lateral compartment  . Osteoarthritis of both shoulders     Mild  . Fibromyalgia   . Chronic pain syndrome   . Osteopenia     DEXA 05/06/2008: L spine T -1.0, Left femur T -0.9, Right femur T -1.1  . Inappropriate sinus tachycardia    Current Outpatient Prescriptions  Medication Sig Dispense Refill  . amitriptyline (ELAVIL) 150 MG tablet Take 1 tablet (150 mg total) by mouth at bedtime.  30 tablet  3  . baclofen (LIORESAL) 10 MG tablet Take 1 tablet (10 mg total) by mouth 3 (three) times daily as needed (muscle spasms).  30 tablet  11  . diclofenac sodium (VOLTAREN) 1 % GEL Apply 2 g topically 4 (four) times daily. To your knees  3 Tube  4  . HYDROcodone-acetaminophen (NORCO) 10-325 MG per tablet Take 1 tablet by mouth every 6 (six) hours as needed. For pain  120 tablet  0  . loratadine (CLARITIN) 10 MG tablet TAKE ONE TABLET BY MOUTH ONCE DAILY  30 tablet  11  .  methadone (DOLOPHINE) 10 MG tablet Take 2 tablets (20 mg total) by mouth 2 (two) times daily.  120 tablet  0  . promethazine (PHENERGAN) 12.5 MG tablet Take 1 tablet (12.5 mg total) by mouth every 8 (eight) hours as needed for nausea.  30 tablet  3  . ranitidine (ZANTAC) 300 MG tablet Take 1 tablet (300 mg total) by mouth at bedtime.  30 tablet  11  . venlafaxine (EFFEXOR) 75 MG tablet Take three tabs in the morning and two at night  150 tablet  11   No current facility-administered medications for this visit.   Family History  Problem Relation Age of Onset  . Hypertension Mother   . Cerebral aneurysm Maternal Aunt   . Early death Father 87    Raod accident  . Healthy Sister   . Healthy Brother   . Healthy Daughter   . Cerebral aneurysm Maternal Grandfather   . Cerebral aneurysm  Maternal Aunt   . Healthy Sister   . Healthy Sister   . Healthy Sister   . Healthy Sister   . Healthy Brother   . Healthy Brother   . Healthy Grandchild    History   Social History  . Marital Status: Married    Spouse Name: N/A    Number of Children: N/A  . Years of Education: N/A   Social History Main Topics  . Smoking status: Never Smoker   . Smokeless tobacco: Never Used  . Alcohol Use: No     Comment: Rarely.   . Drug Use: No  . Sexual Activity: No   Other Topics Concern  . None   Social History Narrative   Patients phone number is [phone]   Review of Systems: Review of Systems  Constitutional: Positive for fever, chills and malaise/fatigue.  HENT: Positive for congestion and sore throat. Negative for ear pain.   Eyes: Negative for blurred vision and discharge.  Respiratory: Positive for cough, sputum production and shortness of breath.        "heavy breathing"  Cardiovascular: Positive for chest pain. Negative for palpitations and leg swelling.       CP with coughing only  Gastrointestinal: Negative for nausea, vomiting, abdominal pain, diarrhea, constipation and blood in stool.  Genitourinary: Negative for dysuria.  Musculoskeletal: Positive for myalgias. Negative for falls.       Chronic all-over pain though today a little different  Neurological: Negative for dizziness, loss of consciousness, weakness and headaches.    Objective:  Physical Exam: Filed Vitals:   06/11/13 0838  BP: 168/93  Pulse: 132  Temp: 98.7 F (37.1 C)  TempSrc: Oral  Height: 5' 3.5" (1.613 m)  Weight: 196 lb 8 oz (89.132 kg)  SpO2: 100%   PEX General: alert, cooperative, in mild distress due to discomfort and cough HEENT: NCAT, vision grossly intact, oropharynx clear (no exudates) though mildly erythematous Neck: supple, no lymphadenopathy Lungs: clear to ascultation bilaterally, normal work of respiration, no wheezes, rales, ronchi Heart: tachycardic, regular rate and  rhythm, no murmurs, gallops, or rubs Abdomen: soft, non-tender, non-distended, normal bowel sounds Extremities: 2+ DP/PT pulses bilaterally, no cyanosis, clubbing, or edema Neurologic: alert & oriented X3, cranial nerves II-XII intact, strength grossly intact, sensation intact to light touch  Assessment & Plan:  Patient discussed with Dr. Murlean Caller.  Please see problem-based assessment and plan.

## 2013-06-11 NOTE — Assessment & Plan Note (Signed)
BP elevated at 168/93 today.  Possibly elevated due to acute illness and pain component.  Will re-assess at follow-up visit next week.  Patient may need to restart B blocker (recently discontinued in 02/2013, patient does have financial constraints).

## 2013-06-12 ENCOUNTER — Telehealth: Payer: Self-pay | Admitting: *Deleted

## 2013-06-12 NOTE — Telephone Encounter (Signed)
My recommendation would be to reduce methadone as well. Needs follow up EKG off of elavil to see if this has made a difference.

## 2013-06-12 NOTE — Telephone Encounter (Signed)
Pt called this AM c/o of n/v  after taking one dose Tamiflu. Throat is sore. Talked to Dr Stann Mainland - suggest sips of fluid water and ginger ale along with dry toast and crackers. If unable to take any fluids 24 hours - needs to be seen in clinic 06/13/13. Unable to reach pt [phone] NA 9:50AM,10AM, 10:39AM, 11:30AM, 1:30PM and 2:08PM. Tried home phone # and left message at 2:30PM. Talked with pt 2:38PM - aware of Dr Stann Mainland response will call AM if no change. Pt states has diarrhea and has not taken any meds or liquids. Hilda Blades Marciel Offenberger RN 06/12/13 2:45PM

## 2013-06-12 NOTE — Progress Notes (Signed)
INTERNAL MEDICINE TEACHING ATTENDING ADDENDUM - Britany Callicott, DO: I reviewed and discussed at the time of visit with the resident Dr. Rogers, the patient's medical history, physical examination, diagnosis and results of tests and treatment and I agree with the patient's care as documented.  

## 2013-06-18 ENCOUNTER — Telehealth: Payer: Self-pay | Admitting: *Deleted

## 2013-06-18 ENCOUNTER — Ambulatory Visit: Payer: Medicare Other | Admitting: Internal Medicine

## 2013-06-18 NOTE — Telephone Encounter (Signed)
Pt arrived over 31 mins late for f/u appt, she states her recent sickness is lingering with fevers on and off to 102.0 in the last 24 hrs. She desires to be seen, after consulting with [place]. Pt will see dr Mechele Claude if her 1015 appt doesn't show. She will be put in a 1030 slot. She was to f/u with dr Stann Mainland but no other spaces this am and pt states her husband works in the pm so she would have no transportation.

## 2013-06-20 ENCOUNTER — Ambulatory Visit: Payer: Medicare Other | Admitting: Internal Medicine

## 2013-06-26 ENCOUNTER — Ambulatory Visit: Payer: Medicare Other | Admitting: Internal Medicine

## 2013-06-28 ENCOUNTER — Encounter: Payer: Medicare Other | Attending: Physical Medicine & Rehabilitation | Admitting: Physical Medicine & Rehabilitation

## 2013-06-28 ENCOUNTER — Encounter: Payer: Self-pay | Admitting: Physical Medicine & Rehabilitation

## 2013-06-28 VITALS — BP 174/92 | HR 140 | Resp 16 | Ht 63.0 in | Wt 193.0 lb

## 2013-06-28 DIAGNOSIS — M170 Bilateral primary osteoarthritis of knee: Secondary | ICD-10-CM

## 2013-06-28 DIAGNOSIS — M1710 Unilateral primary osteoarthritis, unspecified knee: Secondary | ICD-10-CM | POA: Insufficient documentation

## 2013-06-28 DIAGNOSIS — M797 Fibromyalgia: Secondary | ICD-10-CM

## 2013-06-28 DIAGNOSIS — M19011 Primary osteoarthritis, right shoulder: Secondary | ICD-10-CM

## 2013-06-28 DIAGNOSIS — IMO0001 Reserved for inherently not codable concepts without codable children: Secondary | ICD-10-CM | POA: Insufficient documentation

## 2013-06-28 DIAGNOSIS — G894 Chronic pain syndrome: Secondary | ICD-10-CM | POA: Insufficient documentation

## 2013-06-28 DIAGNOSIS — IMO0002 Reserved for concepts with insufficient information to code with codable children: Secondary | ICD-10-CM | POA: Insufficient documentation

## 2013-06-28 DIAGNOSIS — R9431 Abnormal electrocardiogram [ECG] [EKG]: Secondary | ICD-10-CM | POA: Insufficient documentation

## 2013-06-28 DIAGNOSIS — M19012 Primary osteoarthritis, left shoulder: Secondary | ICD-10-CM

## 2013-06-28 DIAGNOSIS — M19019 Primary osteoarthritis, unspecified shoulder: Secondary | ICD-10-CM | POA: Insufficient documentation

## 2013-06-28 MED ORDER — HYDROCODONE-ACETAMINOPHEN 10-325 MG PO TABS
1.0000 | ORAL_TABLET | Freq: Four times a day (QID) | ORAL | Status: DC | PRN
Start: 2013-06-28 — End: 2013-08-26

## 2013-06-28 MED ORDER — GABAPENTIN 100 MG PO CAPS: 100.0000 mg | ORAL_CAPSULE | Freq: Three times a day (TID) | ORAL | Status: DC

## 2013-06-28 NOTE — Progress Notes (Signed)
Subjective:    Patient ID: Morgan Wilkerson, female    DOB: May 01, 1956, 58 y.o.   MRN: [id number]  HPI  Morgan Wilkerson is back regarding her chronic pain.  She came down with influenza A 2+ weeks ago and was given tamiflu for treatment. She continues to have congestion. She is feeling light-headed as well.  She is trying to drink as much as she can.   She has come off her diabetes medications and elavil recently. Some QT prolongation was noted on EKG as well.   Prior to becoming ill, she has been fairly stable. She states that she has been exercising. She describes a few of these exercises to me. She is limited to 15-20 minutes due to increased low back pain. She describes some radiation into the legs and abdomen as well.   She is also complaining of left lateral knee pain which radiates toward the outside of her left foot. She rubs the knee and leg with her voltaren gel.     Pain Inventory Average Pain 8 Pain Right Now 9 My pain is intermittent, constant, sharp, dull, stabbing, tingling and aching  In the last 24 hours, has pain interfered with the following? General activity 3 Relation with others 2 Enjoyment of life 2 What TIME of day is your pain at its worst? n/a Sleep (in general) Poor  Pain is worse with: walking, bending, sitting, inactivity, standing and some activites Pain improves with: rest, heat/ice, pacing activities, TENS and injections Relief from Meds: 5  Mobility walk with assistance how many minutes can you walk? 15-20 ability to climb steps?  no do you drive?  no needs help with transfers Do you have any goals in this area?  yes  Function disabled: date disabled filed I need assistance with the following:  dressing, bathing, meal prep, household duties and shopping Do you have any goals in this area?  yes  Neuro/Psych bladder control problems bowel control problems weakness numbness tremor tingling trouble  walking spasms dizziness confusion depression anxiety  Prior Studies Any changes since last visit?  no  Physicians involved in your care Any changes since last visit?  no   Family History  Problem Relation Age of Onset  . Hypertension Mother   . Cerebral aneurysm Maternal Aunt   . Early death Father 77    Raod accident  . Healthy Sister   . Healthy Brother   . Healthy Daughter   . Cerebral aneurysm Maternal Grandfather   . Cerebral aneurysm Maternal Aunt   . Healthy Sister   . Healthy Sister   . Healthy Sister   . Healthy Sister   . Healthy Brother   . Healthy Brother   . Healthy Grandchild    History   Social History  . Marital Status: Married    Spouse Name: N/A    Number of Children: N/A  . Years of Education: N/A   Social History Main Topics  . Smoking status: Never Smoker   . Smokeless tobacco: Never Used  . Alcohol Use: No     Comment: Rarely.   . Drug Use: No  . Sexual Activity: No   Other Topics Concern  . None   Social History Narrative   Patients phone number is [phone]   Past Surgical History  Procedure Laterality Date  . Temporal artery biopsy / ligation  6/09    Negative for temperal arteritis  . Cystoscopy      With hydrolic bladder distension  . Cholecystectomy  for chronic acalculus cholecystitis  . Vaginal hysterectomy  2003    with oophorectomy, for dysfunctional uterine bleeding   Past Medical History  Diagnosis Date  . Essential hypertension   . Toe fracture 2001/2002    Left pinky toe  . Diabetes mellitus 6/09  . Gastritis     Chronic, per biopsy  . Depression   . Seasonal allergic rhinitis     Spring time  . Gastroesophageal reflux disease   . Anxiety     Panic disorder  . Anemia     Anemia of chronic disease  . Osteoarthritis of both knees     Tricompartmental, worse in lateral compartment  . Osteoarthritis of both shoulders     Mild  . Fibromyalgia   . Chronic pain syndrome   . Osteopenia      DEXA 05/06/2008: L spine T -1.0, Left femur T -0.9, Right femur T -1.1  . Inappropriate sinus tachycardia    BP 174/92  Pulse 140  Resp 16  Ht 5\' 3"  (1.6 m)  Wt 193 lb (87.544 kg)  BMI 34.20 kg/m2  SpO2 99%  LMP 03/08/2002  Opioid Risk Score:   Fall Risk Score: Moderate Fall Risk (6-13 points) (patient educated handout given)   Review of Systems  Gastrointestinal: Positive for constipation.  Genitourinary: Positive for difficulty urinating.  Musculoskeletal: Positive for arthralgias, gait problem and myalgias.  Neurological: Positive for dizziness, tremors, weakness and numbness.  Psychiatric/Behavioral: Positive for confusion and dysphoric mood. The patient is nervous/anxious.   All other systems reviewed and are negative.       Objective:   Physical Exam GENERAL: The patient is generally alert, although she appears to be in  a bit of malaise which is not too uncommon for her. She appears  depressed.  EXTREMITIES: Gait is wide-based and she uses the crutch to unload the  ankles. She is diffusely tender with palpation from the neck,  shoulders, elbows, hands, trunk, knees and all way down to the feet. No  focal weakness could be appreciated except for some pain inhibition at  the shoulders and ankle. Sensory exam is grossly intact. Reflexes are  1+. Weight is stable. Knees are notable for valgus deformities and atalgia. She has lateral joint line pain bilaterall and crepitus with AROM and PROM in particular on the left side. Marland Kitchen  PSYCH: mood is quite flat and depressed    ASSESSMENT:  1. Fibromyalgia syndrome.  2. 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  3. Myofascial shoulder pain.  4. Temporal arteritis.  5. Depression.  6. History of insomnia.  7. Osteoarthritis of the knees    PLAN:  1. I refilled her Norco 10/325 #120 and methadone 10 mg #60.  2. Resume gabapentin for fibromyalgia pain. 100mg  tid. We won't resume elavil given QT interval. 3.  maintain Effexor at 150 mg b.i.d.  4. Ordered xrays of the lumbar spine and left knee to assess the extent of disease. Consider interventions potentially as WELL. Advised low impact exercise as well.  5. 30 minutes of face to face patient care time were spent during this visit. All questions were encouraged and answered. Follow up with me next month.

## 2013-06-28 NOTE — Patient Instructions (Signed)
WORK ON GOOD SITTING AND STANDING POSTURE.Marland Kitchen

## 2013-07-08 ENCOUNTER — Telehealth: Payer: Self-pay

## 2013-07-08 DIAGNOSIS — IMO0001 Reserved for inherently not codable concepts without codable children: Secondary | ICD-10-CM

## 2013-07-08 DIAGNOSIS — F32A Depression, unspecified: Secondary | ICD-10-CM

## 2013-07-08 DIAGNOSIS — F329 Major depressive disorder, single episode, unspecified: Secondary | ICD-10-CM

## 2013-07-08 DIAGNOSIS — M170 Bilateral primary osteoarthritis of knee: Secondary | ICD-10-CM

## 2013-07-08 MED ORDER — METHADONE HCL 10 MG PO TABS: 20.0000 mg | ORAL_TABLET | Freq: Two times a day (BID) | ORAL | Status: DC

## 2013-07-08 NOTE — Telephone Encounter (Signed)
Methadone rx printed for Morgan Wilkerson to sign.  Will contact patient when ready.

## 2013-07-08 NOTE — Telephone Encounter (Signed)
Left message informing patient her rx is ready for pick up.

## 2013-07-08 NOTE — Telephone Encounter (Signed)
Patient called and said she did not receive her methadone rx.  She was not sure if it had been discontinued or if it was just forgotten?  Methadone rx was not printed, will reprint and call patient when it has been signed.  (Trouble with printer connection will try printing again later)

## 2013-07-26 ENCOUNTER — Ambulatory Visit: Payer: Medicare Other | Admitting: Internal Medicine

## 2013-08-22 ENCOUNTER — Encounter: Payer: Self-pay | Admitting: Internal Medicine

## 2013-08-22 ENCOUNTER — Ambulatory Visit (INDEPENDENT_AMBULATORY_CARE_PROVIDER_SITE_OTHER): Payer: Medicare Other | Admitting: Internal Medicine

## 2013-08-22 VITALS — BP 150/94 | HR 122 | Temp 98.8°F | Ht 63.0 in | Wt 196.2 lb

## 2013-08-22 DIAGNOSIS — R000 Tachycardia, unspecified: Secondary | ICD-10-CM

## 2013-08-22 DIAGNOSIS — E119 Type 2 diabetes mellitus without complications: Secondary | ICD-10-CM

## 2013-08-22 DIAGNOSIS — L02838 Carbuncle of other sites: Secondary | ICD-10-CM

## 2013-08-22 DIAGNOSIS — L0292 Furuncle, unspecified: Secondary | ICD-10-CM | POA: Insufficient documentation

## 2013-08-22 DIAGNOSIS — G894 Chronic pain syndrome: Secondary | ICD-10-CM

## 2013-08-22 DIAGNOSIS — L02828 Furuncle of other sites: Secondary | ICD-10-CM

## 2013-08-22 DIAGNOSIS — I10 Essential (primary) hypertension: Secondary | ICD-10-CM

## 2013-08-22 LAB — GLUCOSE, CAPILLARY: Glucose-Capillary: 150 mg/dL — ABNORMAL HIGH (ref 70–99)

## 2013-08-22 LAB — POCT GLYCOSYLATED HEMOGLOBIN (HGB A1C): HEMOGLOBIN A1C: 5.9

## 2013-08-22 LAB — HM DIABETES EYE EXAM

## 2013-08-22 MED ORDER — METOPROLOL TARTRATE 12.5 MG HALF TABLET: 12.5000 mg | ORAL_TABLET | Freq: Two times a day (BID) | ORAL | Status: DC

## 2013-08-22 MED ORDER — DOXYCYCLINE HYCLATE 100 MG PO TABS: 100.0000 mg | ORAL_TABLET | Freq: Two times a day (BID) | ORAL | Status: DC

## 2013-08-22 NOTE — Assessment & Plan Note (Signed)
Lab Results  Component Value Date   HGBA1C 5.9 08/22/2013   HGBA1C 6.0 01/14/2013   HGBA1C 5.9 09/13/2012     Assessment: Diabetes control: good control (HgbA1C at goal) Progress toward A1C goal:  at goal Comments: HA1C 5.9 today   Plan: Medications:  No medications  Home glucose monitoring: Frequency: no home glucose monitoring Timing: N/A Instruction/counseling given: reminded to get eye exam Educational resources provided: brochure;other (see comments) Self management tools provided:: none  Other plans: retinal exam today

## 2013-08-22 NOTE — Assessment & Plan Note (Signed)
Follows with [facility] asked that she get narcotic and gabapentin from them 08/26/13

## 2013-08-22 NOTE — Assessment & Plan Note (Signed)
Unclear etiology pt had a last visit may be from physical deconditioning, pt is not febrile  Last visit disc'ed resuming BB if still tachy Will try Lopressor 12.5 mg bid  Reassess at f/u

## 2013-08-22 NOTE — Assessment & Plan Note (Signed)
Noted to scalp 1 cm. Not much pus extracted when attempted  Will continue with warm compresses and do Doxy 100 mg bid x 7 days Lymphadenopathy likely related to furuncle   F/u 7-10 days if not improved

## 2013-08-22 NOTE — Patient Instructions (Addendum)
General Instructions: Please use warm compress to scalp and take antibiotic as directed  Take Doxycycline 100 mg 2x per day x 7 days  F/u in 7-10 days, sooner if needed  Read information below  Treatment Goals:  Goals (1 Years of Data) as of 08/22/13         As of Today 06/28/13 06/11/13 05/27/13 04/25/13     Blood Pressure    . Blood Pressure < 140/90  143/91 174/92 168/93 177/80 168/77     Result Component    . HEMOGLOBIN A1C < 7.0  5.9        . LDL CALC < 100           Weight    . Weight < 180 lb (81.647 kg)  196 lb 3.2 oz (88.996 kg) 193 lb (87.544 kg) 196 lb 8 oz (89.132 kg) 196 lb (88.905 kg)       Progress Toward Treatment Goals:  Treatment Goal 08/22/2013  Hemoglobin A1C at goal  Blood pressure (No Data)  Prevent falls deteriorated    Self Care Goals & Plans:  Self Care Goal 08/22/2013  Manage my medications take my medicines as prescribed; bring my medications to every visit; refill my medications on time  Monitor my health keep track of my blood glucose; bring my glucose meter and log to each visit; keep track of my blood pressure; bring my blood pressure log to each visit; keep track of my weight; check my feet daily  Eat healthy foods drink diet soda or water instead of juice or soda; eat more vegetables; eat foods that are low in salt; eat baked foods instead of fried foods; eat fruit for snacks and desserts; eat smaller portions  Be physically active find an activity I enjoy  Prevent falls -  Meeting treatment goals -    Home Blood Glucose Monitoring 08/22/2013  Check my blood sugar no home glucose monitoring  When to check my blood sugar N/A     Care Management & Community Referrals:  Referral 08/22/2013  Referrals made for care management support none needed  Referrals made to community resources none       Abscess An abscess is an infected area that contains a collection of pus and debris.It can occur in almost any part of the body. An abscess is  also known as a furuncle or boil. CAUSES  An abscess occurs when tissue gets infected. This can occur from blockage of oil or sweat glands, infection of hair follicles, or a minor injury to the skin. As the body tries to fight the infection, pus collects in the area and creates pressure under the skin. This pressure causes pain. People with weakened immune systems have difficulty fighting infections and get certain abscesses more often.  SYMPTOMS Usually an abscess develops on the skin and becomes a painful mass that is red, warm, and tender. If the abscess forms under the skin, you may feel a moveable soft area under the skin. Some abscesses break open (rupture) on their own, but most will continue to get worse without care. The infection can spread deeper into the body and eventually into the bloodstream, causing you to feel ill.  DIAGNOSIS  Your caregiver will take your medical history and perform a physical exam. A sample of fluid may also be taken from the abscess to determine what is causing your infection. TREATMENT  Your caregiver may prescribe antibiotic medicines to fight the infection. However, taking antibiotics alone usually does not  cure an abscess. Your caregiver may need to make a small cut (incision) in the abscess to drain the pus. In some cases, gauze is packed into the abscess to reduce pain and to continue draining the area. HOME CARE INSTRUCTIONS   Only take over-the-counter or prescription medicines for pain, discomfort, or fever as directed by your caregiver.  If you were prescribed antibiotics, take them as directed. Finish them even if you start to feel better.  If gauze is used, follow your caregiver's directions for changing the gauze.  To avoid spreading the infection:  Keep your draining abscess covered with a bandage.  Wash your hands well.  Do not share personal care items, towels, or whirlpools with others.  Avoid skin contact with others.  Keep your skin  and clothes clean around the abscess.  Keep all follow-up appointments as directed by your caregiver. SEEK MEDICAL CARE IF:   You have increased pain, swelling, redness, fluid drainage, or bleeding.  You have muscle aches, chills, or a general ill feeling.  You have a fever. MAKE SURE YOU:   Understand these instructions.  Will watch your condition.  Will get help right away if you are not doing well or get worse. Document Released: 02/02/2005 Document Revised: 10/25/2011 Document Reviewed: 07/08/2011 [facility] Patient Information 2014 [place]. Fall Prevention and Home Safety Falls cause injuries and can affect all age groups. It is possible to prevent falls.  HOW TO PREVENT FALLS  Wear shoes with rubber soles that do not have an opening for your toes.  Keep the inside and outside of your house well lit.  Use night lights throughout your home.  Remove clutter from floors.  Clean up floor spills.  Remove throw rugs or fasten them to the floor with carpet tape.  Do not place electrical cords across pathways.  Put grab bars by your tub, shower, and toilet. Do not use towel bars as grab bars.  Put handrails on both sides of the stairway. Fix loose handrails.  Do not climb on stools or stepladders, if possible.  Do not wax your floors.  Repair uneven or unsafe sidewalks, walkways, or stairs.  Keep items you use a lot within reach.  Be aware of pets.  Keep emergency numbers next to the telephone.  Put smoke detectors in your home and near bedrooms. Ask your doctor what other things you can do to prevent falls. Document Released: 02/19/2009 Document Revised: 10/25/2011 Document Reviewed: 07/26/2011 [facility] Patient Information 2014 [place], [place].  Doxycycline tablets or capsules What is this medicine? DOXYCYCLINE (dox i SYE kleen) is a tetracycline antibiotic. It kills certain bacteria or stops their growth. It is used to treat many kinds of infections,  like dental, skin, respiratory, and urinary tract infections. It also treats acne, Lyme disease, malaria, and certain sexually transmitted infections. This medicine may be used for other purposes; ask your health care provider or pharmacist if you have questions. COMMON BRAND NAME(S): Adoxa CK, Adoxa Pak, Adoxa TT, Adoxa, Alodox, Avidoxy, Doxal, Monodox, Morgidox 1x Kit, Morgidox 1x, Morgidox 2x , Morgidox 2x Kit, Ocudox , Vibra-Tabs, Vibramycin What should I tell my health care provider before I take this medicine? They need to know if you have any of these conditions: -liver disease -long exposure to sunlight like working outdoors -stomach problems like colitis -an unusual or allergic reaction to doxycycline, tetracycline antibiotics, other medicines, foods, dyes, or preservatives -pregnant or trying to get pregnant -breast-feeding How should I use this medicine? Take this medicine  by mouth with a full glass of water. Follow the directions on the prescription label. It is best to take this medicine without food, but if it upsets your stomach take it with food. Take your medicine at regular intervals. Do not take your medicine more often than directed. Take all of your medicine as directed even if you think you are better. Do not skip doses or stop your medicine early. Talk to your pediatrician regarding the use of this medicine in children. Special care may be needed. While this drug may be prescribed for children as young as 52 years old for selected conditions, precautions do apply. Overdosage: If you think you have taken too much of this medicine contact a poison control center or emergency room at once. NOTE: This medicine is only for you. Do not share this medicine with others. What if I miss a dose? If you miss a dose, take it as soon as you can. If it is almost time for your next dose, take only that dose. Do not take double or extra doses. What may interact with this  medicine? -antacids -barbiturates -birth control pills -bismuth subsalicylate -carbamazepine -methoxyflurane -other antibiotics -phenytoin -vitamins that contain iron -warfarin This list may not describe all possible interactions. Give your health care provider a list of all the medicines, herbs, non-prescription drugs, or dietary supplements you use. Also tell them if you smoke, drink alcohol, or use illegal drugs. Some items may interact with your medicine. What should I watch for while using this medicine? Tell your doctor or health care professional if your symptoms do not improve. Do not treat diarrhea with over the counter products. Contact your doctor if you have diarrhea that lasts more than 2 days or if it is severe and watery. Do not take this medicine just before going to bed. It may not dissolve properly when you lay down and can cause pain in your throat. Drink plenty of fluids while taking this medicine to also help reduce irritation in your throat. This medicine can make you more sensitive to the sun. Keep out of the sun. If you cannot avoid being in the sun, wear protective clothing and use sunscreen. Do not use sun lamps or tanning beds/booths. Birth control pills may not work properly while you are taking this medicine. Talk to your doctor about using an extra method of birth control. If you are being treated for a sexually transmitted infection, avoid sexual contact until you have finished your treatment. Your sexual partner may also need treatment. Avoid antacids, aluminum, calcium, magnesium, and iron products for 4 hours before and 2 hours after taking a dose of this medicine. If you are using this medicine to prevent malaria, you should still protect yourself from contact with mosquitos. Stay in screened-in areas, use mosquito nets, keep your body covered, and use an insect repellent. What side effects may I notice from receiving this medicine? Side effects that you  should report to your doctor or health care professional as soon as possible: -allergic reactions like skin rash, itching or hives, swelling of the face, lips, or tongue -difficulty breathing -fever -itching in the rectal or genital area -pain on swallowing -redness, blistering, peeling or loosening of the skin, including inside the mouth -severe stomach pain or cramps -unusual bleeding or bruising -unusually weak or tired -yellowing of the eyes or skin Side effects that usually do not require medical attention (report to your doctor or health care professional if they continue or are bothersome): -  diarrhea -loss of appetite -nausea, vomiting This list may not describe all possible side effects. Call your doctor for medical advice about side effects. You may report side effects to FDA at 1-800-FDA-1088. Where should I keep my medicine? Keep out of the reach of children. Store at room temperature, below 30 degrees C (86 degrees F). Protect from light. Keep container tightly closed. Throw away any unused medicine after the expiration date. Taking this medicine after the expiration date can make you seriously ill. NOTE: This sheet is a summary. It may not cover all possible information. If you have questions about this medicine, talk to your doctor, pharmacist, or health care provider.  2014, [employer]vier/[employer] Standard. (2007-08-14 16:53:02)   DASH Diet The DASH diet stands for "Dietary Approaches to Stop Hypertension." It is a healthy eating plan that has been shown to reduce high blood pressure (hypertension) in as little as 14 days, while also possibly providing other significant health benefits. These other health benefits include reducing the risk of breast cancer after menopause and reducing the risk of type 2 diabetes, heart disease, colon cancer, and stroke. Health benefits also include weight loss and slowing kidney failure in patients with chronic kidney disease.  DIET  GUIDELINES  Limit salt (sodium). Your diet should contain less than 1500 mg of sodium daily.  Limit refined or processed carbohydrates. Your diet should include mostly whole grains. Desserts and added sugars should be used sparingly.  Include small amounts of heart-healthy fats. These types of fats include nuts, oils, and tub margarine. Limit saturated and trans fats. These fats have been shown to be harmful in the body. CHOOSING FOODS  The following food groups are based on a 2000 calorie diet. See your Registered Dietitian for individual calorie needs. Grains and Grain Products (6 to 8 servings daily)  Eat More Often: Whole-wheat bread, brown rice, whole-grain or wheat pasta, quinoa, popcorn without added fat or salt (air popped).  Eat Less Often: White bread, white pasta, white rice, cornbread. Vegetables (4 to 5 servings daily)  Eat More Often: Fresh, frozen, and canned vegetables. Vegetables may be raw, steamed, roasted, or grilled with a minimal amount of fat.  Eat Less Often/Avoid: Creamed or fried vegetables. Vegetables in a cheese sauce. Fruit (4 to 5 servings daily)  Eat More Often: All fresh, canned (in natural juice), or frozen fruits. Dried fruits without added sugar. One hundred percent fruit juice ( cup [237 mL] daily).  Eat Less Often: Dried fruits with added sugar. Canned fruit in light or heavy syrup. [employer], Fish, and Poultry (2 servings or less daily. One serving is 3 to 4 oz [85-114 g]).  Eat More Often: Ninety percent or leaner ground beef, tenderloin, sirloin. Round cuts of beef, chicken breast, [place] breast. All fish. Grill, bake, or broil your meat. Nothing should be fried.  Eat Less Often/Avoid: Fatty cuts of meat, [place], or chicken leg, thigh, or wing. Fried cuts of meat or fish. Dairy (2 to 3 servings)  Eat More Often: Low-fat or fat-free milk, low-fat plain or light yogurt, reduced-fat or part-skim cheese.  Eat Less Often/Avoid: Milk (whole,  2%).Whole milk yogurt. Full-fat cheeses. Nuts, Seeds, and Legumes (4 to 5 servings per week)  Eat More Often: All without added salt.  Eat Less Often/Avoid: Salted nuts and seeds, canned beans with added salt. Fats and Sweets (limited)  Eat More Often: Vegetable oils, tub margarines without trans fats, sugar-free gelatin. Mayonnaise and salad dressings.  Eat Less Often/Avoid: Coconut oils, palm  oils, butter, stick margarine, cream, half and half, cookies, candy, pie. FOR MORE INFORMATION The Dash Diet Eating Plan: www.dashdiet.org Document Released: 04/14/2011 Document Revised: 07/18/2011 Document Reviewed: 04/14/2011 [facility] Patient Information 2014 [place], [place].  Hypertension As your heart beats, it forces blood through your arteries. This force is your blood pressure. If the pressure is too high, it is called hypertension (HTN) or high blood pressure. HTN is dangerous because you may have it and not know it. High blood pressure may mean that your heart has to work harder to pump blood. Your arteries may be narrow or stiff. The extra work puts you at risk for heart disease, stroke, and other problems.  Blood pressure consists of two numbers, a higher number over a lower, 110/72, for example. It is stated as "110 over 72." The ideal is below 120 for the top number (systolic) and under 80 for the bottom (diastolic). Write down your blood pressure today. You should pay close attention to your blood pressure if you have certain conditions such as:  Heart failure.  Prior heart attack.  Diabetes  Chronic kidney disease.  Prior stroke.  Multiple risk factors for heart disease. To see if you have HTN, your blood pressure should be measured while you are seated with your arm held at the level of the heart. It should be measured at least twice. A one-time elevated blood pressure reading (especially in the Emergency Department) does not mean that you need treatment. There may be  conditions in which the blood pressure is different between your right and left arms. It is important to see your caregiver soon for a recheck. Most people have essential hypertension which means that there is not a specific cause. This type of high blood pressure may be lowered by changing lifestyle factors such as:  Stress.  Smoking.  Lack of exercise.  Excessive weight.  Drug/tobacco/alcohol use.  Eating less salt. Most people do not have symptoms from high blood pressure until it has caused damage to the body. Effective treatment can often prevent, delay or reduce that damage. TREATMENT  When a cause has been identified, treatment for high blood pressure is directed at the cause. There are a large number of medications to treat HTN. These fall into several categories, and your caregiver will help you select the medicines that are best for you. Medications may have side effects. You should review side effects with your caregiver. If your blood pressure stays high after you have made lifestyle changes or started on medicines,   Your medication(s) may need to be changed.  Other problems may need to be addressed.  Be certain you understand your prescriptions, and know how and when to take your medicine.  Be sure to follow up with your caregiver within the time frame advised (usually within two weeks) to have your blood pressure rechecked and to review your medications.  If you are taking more than one medicine to lower your blood pressure, make sure you know how and at what times they should be taken. Taking two medicines at the same time can result in blood pressure that is too low. SEEK IMMEDIATE MEDICAL CARE IF:  You develop a severe headache, blurred or changing vision, or confusion.  You have unusual weakness or numbness, or a faint feeling.  You have severe chest or abdominal pain, vomiting, or breathing problems. MAKE SURE YOU:   Understand these instructions.  Will  watch your condition.  Will get help right away if you are  not doing well or get worse. Document Released: 04/25/2005 Document Revised: 07/18/2011 Document Reviewed: 12/14/2007 [facility] Patient Information 2014 [place].

## 2013-08-22 NOTE — Assessment & Plan Note (Addendum)
BP Readings from Last 3 Encounters:  08/22/13 150/94  06/28/13 174/92  06/11/13 168/93    Lab Results  Component Value Date   NA 140 05/04/2012   K 3.6 05/04/2012   CREATININE 0.66 05/04/2012    Assessment: Blood pressure control: mildly elevated Progress toward BP goal:   (slightly elevated ) Comments: 143/91 then 150/94.  Pt not on BP meds prior to visit   Plan: Medications:  restarted Lopressor 12.5 mg bid noted in last visit due to tachycardia.  At f/u pt may need another agent for BP control but today she is also in pain. reassess need for BB in future  Educational resources provided: brochure;other (see comments) Self management tools provided: home blood pressure logbook Other plans: f/u with PCP

## 2013-08-22 NOTE — Progress Notes (Signed)
   Subjective:    Patient ID: Morgan Wilkerson, female    DOB: 1955-12-18, 58 y.o.   MRN: [id number]  HPI Comments: 28 Past Medical History Essential hypertension, Diabetes mellitus, Gastritis, Depression, Seasonal allergic rhinitis, Gastroesophageal reflux disease, Anxiety/Panic disorder, Anemia on chronic disease                           Osteoarthritis of both knees, shoulders, Fibromyalgia, Chronic pain syndrome, Osteopenia, Inappropriate sinus tachycardia                             She presents for:  1) HTN/tachycardiac f/u last visit BP was 143/91 with repeat 150/95 and HR 128 then 122. Last visit noted restarting BB if pt continued to be tachycardic  2) H/o DM-due for eye exam will do retinal exam  3) bumps to head/scalp and ear x 3-4 days that are painful w/o drainage.   4) She needs medication refills (Gabapentin 100 tid and Nocro 10-325 mg)-asked that she get those from [facility] for chronic pain (c/o back pain that goes to legs and ankles).  She also reports her back locks at times in place and she falls or can't stretch her legs out or walk straight. She will f/u with with pain clinic 08/26/13      Review of Systems  Constitutional: Negative for fever and chills.  Respiratory: Negative for shortness of breath.   Cardiovascular: Negative for chest pain.  Gastrointestinal:       Chronic abdominal pain  Musculoskeletal: Positive for back pain.       Chronic pain   Skin: Positive for wound.       Objective:   Physical Exam  Nursing note and vitals reviewed. Constitutional: She is oriented to person, place, and time. She appears well-developed and well-nourished. She is cooperative. No distress.  HENT:  Head: Normocephalic and atraumatic.    Mouth/Throat: Oropharynx is clear and moist and mucous membranes are normal. Abnormal dentition. No oropharyngeal exudate.  Eyes: Conjunctivae are normal. Pupils are equal, round, and reactive to light. Right eye exhibits no discharge. Left eye  exhibits no discharge. No scleral icterus.  Cardiovascular: Regular rhythm, S1 normal, S2 normal and normal heart sounds.  Bradycardia present.   No murmur heard. No lower ext edema   Pulmonary/Chest: Effort normal and breath sounds normal. No respiratory distress. She has no wheezes.  Abdominal: Soft. Bowel sounds are normal. There is tenderness.  Mild generalized ttp ab (chronic per pt)  Neurological: She is alert and oriented to person, place, and time.  Walks with crutches   Skin: Skin is warm, dry and intact. No rash noted. She is not diaphoretic.  Psychiatric: She has a normal mood and affect. Her speech is normal and behavior is normal. Judgment and thought content normal. Cognition and memory are normal.          Assessment & Plan:  F/u in 7-10 days

## 2013-08-23 NOTE — Progress Notes (Signed)
INTERNAL MEDICINE TEACHING ATTENDING ADDENDUM - Eilan Mcinerny, MD: I reviewed and discussed at the time of visit with the resident Dr. McLean, the patient's medical history, physical examination, diagnosis and results of tests and treatment and I agree with the patient's care as documented. 

## 2013-08-26 ENCOUNTER — Encounter: Payer: Medicare Other | Attending: Physical Medicine & Rehabilitation | Admitting: Physical Medicine & Rehabilitation

## 2013-08-26 ENCOUNTER — Encounter: Payer: Self-pay | Admitting: Physical Medicine & Rehabilitation

## 2013-08-26 VITALS — BP 185/96 | HR 109 | Resp 14 | Ht 63.0 in | Wt 192.0 lb

## 2013-08-26 DIAGNOSIS — IMO0002 Reserved for concepts with insufficient information to code with codable children: Secondary | ICD-10-CM | POA: Insufficient documentation

## 2013-08-26 DIAGNOSIS — R9431 Abnormal electrocardiogram [ECG] [EKG]: Secondary | ICD-10-CM

## 2013-08-26 DIAGNOSIS — F329 Major depressive disorder, single episode, unspecified: Secondary | ICD-10-CM | POA: Insufficient documentation

## 2013-08-26 DIAGNOSIS — M1710 Unilateral primary osteoarthritis, unspecified knee: Secondary | ICD-10-CM

## 2013-08-26 DIAGNOSIS — M19011 Primary osteoarthritis, right shoulder: Secondary | ICD-10-CM

## 2013-08-26 DIAGNOSIS — M797 Fibromyalgia: Secondary | ICD-10-CM

## 2013-08-26 DIAGNOSIS — M19019 Primary osteoarthritis, unspecified shoulder: Secondary | ICD-10-CM | POA: Insufficient documentation

## 2013-08-26 DIAGNOSIS — F3289 Other specified depressive episodes: Secondary | ICD-10-CM | POA: Insufficient documentation

## 2013-08-26 DIAGNOSIS — G894 Chronic pain syndrome: Secondary | ICD-10-CM

## 2013-08-26 DIAGNOSIS — M170 Bilateral primary osteoarthritis of knee: Secondary | ICD-10-CM

## 2013-08-26 DIAGNOSIS — IMO0001 Reserved for inherently not codable concepts without codable children: Secondary | ICD-10-CM

## 2013-08-26 DIAGNOSIS — F32A Depression, unspecified: Secondary | ICD-10-CM

## 2013-08-26 DIAGNOSIS — M19012 Primary osteoarthritis, left shoulder: Secondary | ICD-10-CM

## 2013-08-26 MED ORDER — HYDROCODONE-ACETAMINOPHEN 10-325 MG PO TABS: 1.0000 | ORAL_TABLET | Freq: Four times a day (QID) | ORAL | Status: DC | PRN

## 2013-08-26 MED ORDER — METHADONE HCL 10 MG PO TABS: 20.0000 mg | ORAL_TABLET | Freq: Two times a day (BID) | ORAL | Status: DC

## 2013-08-26 NOTE — Patient Instructions (Signed)
DECREASE YOUR GABAPENTIN TO ONE CAPSULE AT DINNER AND ONE AT BEDTIME----HOLD THE MORNING DOSE.   CONTINUE TO WORK ON EXERCISE AS MUCH AS YOU CAN AT HOME.

## 2013-08-26 NOTE — Progress Notes (Signed)
Subjective:    Patient ID: Morgan Wilkerson, female    DOB: [birth year], 58 y.o.   MRN: [id number]  HPI  Morgan Wilkerson is back regarding her FMS. She started on the gabapentin which helped her sleep which really knocked her out apparently. She still is taking it 3 x per day. It really helps her pain. She hasn't developed any tolerance to the medication in that it continues to make her sleepy.  She went into urgent care on Friday. She had a cyst lanced which may have been infected. She was placed on doxycycline and her symptoms are overall better.     Pain Inventory Average Pain 10 Pain Right Now 10 My pain is constant, sharp, stabbing, tingling and aching  In the last 24 hours, has pain interfered with the following? General activity 10 Relation with others 10 Enjoyment of life 10 What TIME of day is your pain at its worst? constant all day Sleep (in general) Fair  Pain is worse with: walking, bending, sitting, inactivity, standing and some activites Pain improves with: injections Relief from Meds: 3  Mobility walk with assistance how many minutes can you walk? 1 ability to climb steps?  no do you drive?  no needs help with transfers Do you have any goals in this area?  yes  Function I need assistance with the following:  dressing, bathing, toileting, meal prep, household duties and shopping Do you have any goals in this area?  yes  Neuro/Psych bladder control problems bowel control problems weakness tremor tingling trouble walking spasms dizziness confusion depression anxiety loss of taste or smell  Prior Studies Any changes since last visit?  no  Physicians involved in your care Any changes since last visit?  no   Family History  Problem Relation Age of Onset  . Hypertension Mother   . Cerebral aneurysm Maternal Aunt   . Early death Father 39    Raod accident  . Healthy Sister   . Healthy Brother   . Healthy Daughter   . Cerebral aneurysm Maternal  Grandfather   . Cerebral aneurysm Maternal Aunt   . Healthy Sister   . Healthy Sister   . Healthy Sister   . Healthy Sister   . Healthy Brother   . Healthy Brother   . Healthy Grandchild    History   Social History  . Marital Status: Married    Spouse Name: N/A    Number of Children: N/A  . Years of Education: 14   Occupational History  .     Social History Main Topics  . Smoking status: Never Smoker   . Smokeless tobacco: Never Used  . Alcohol Use: No     Comment: Rarely.   . Drug Use: No  . Sexual Activity: No   Other Topics Concern  . None   Social History Narrative   Patients phone number is [phone]   Past Surgical History  Procedure Laterality Date  . Temporal artery biopsy / ligation  6/09    Negative for temperal arteritis  . Cystoscopy      With hydrolic bladder distension  . Cholecystectomy      for chronic acalculus cholecystitis  . Vaginal hysterectomy  2003    with oophorectomy, for dysfunctional uterine bleeding   Past Medical History  Diagnosis Date  . Essential hypertension   . Toe fracture 2001/2002    Left pinky toe  . Diabetes mellitus 6/09  . Gastritis     Chronic, per biopsy  .  Depression   . Seasonal allergic rhinitis     Spring time  . Gastroesophageal reflux disease   . Anxiety     Panic disorder  . Anemia     Anemia of chronic disease  . Osteoarthritis of both knees     Tricompartmental, worse in lateral compartment  . Osteoarthritis of both shoulders     Mild  . Fibromyalgia   . Chronic pain syndrome   . Osteopenia     DEXA 05/06/2008: L spine T -1.0, Left femur T -0.9, Right femur T -1.1  . Inappropriate sinus tachycardia    BP 185/96  Pulse 109  Resp 14  Ht 5\' 3"  (1.6 m)  Wt 192 lb (87.091 kg)  BMI 34.02 kg/m2  SpO2 98%  LMP 03/08/2002  Opioid Risk Score:   Fall Risk Score: Moderate Fall Risk (6-13 points) (pt educated and given brochure on fall risk previously)    Review of Systems  Constitutional:  Positive for chills and unexpected weight change.  HENT:       Loss of taste or smell  Respiratory: Positive for shortness of breath.   Gastrointestinal: Positive for nausea, vomiting, abdominal pain and constipation.  Genitourinary:       Bowel and bladder control problems  Musculoskeletal: Positive for myalgias.  Neurological: Positive for dizziness, tremors and weakness.       Tingling, spasms  Psychiatric/Behavioral: Positive for confusion. The patient is nervous/anxious.        Depression  All other systems reviewed and are negative.      Objective:   Physical Exam  GENERAL: The patient is generally alert, although she appears to be in  a bit of malaise which is not too uncommon for her. She appears  depressed.  EXTREMITIES: Gait is wide-based and she uses the crutch to unload the  ankles. She is diffusely tender with palpation from the neck,  shoulders, elbows, hands, trunk, knees and all way down to the feet. Shoulders are sensitive to touch and basic AROM/PROM Sensory exam is grossly intact. Reflexes are  1+. Weight is stable. Knees are notable for valgus deformities. She has lateral joint line pain bilaterall and crepitus with AROM and PROM .  PSYCH: mood is bright and appropriate SKIN: healing lesion on scalp   ASSESSMENT:  1. Fibromyalgia syndrome.  2. 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  3. Myofascial shoulder pain.  4. Temporal arteritis.  5. Depression.  6. History of insomnia.  7. Osteoarthritis of the knees    PLAN:  1. I refilled her Norco 10/325 #120 and methadone 10 mg #60 with second rx's for next month 2. Will use gabapentin no more than 100mg  BID for now with dose in the evening only..  3. maintain Effexor at 150 mg b.i.d.  4. There are still xrays ordered for her back if she should so choose. MRI is not indicated 5. 15 minutes of face to face patient care time were spent during this visit. All questions were encouraged and  answered. Follow up with my NP next month.

## 2013-09-02 ENCOUNTER — Ambulatory Visit: Payer: Medicare Other | Admitting: Internal Medicine

## 2013-09-04 ENCOUNTER — Ambulatory Visit
Admission: RE | Admit: 2013-09-04 | Discharge: 2013-09-04 | Disposition: A | Discharge status: Home / Self Care | Payer: Medicare Other | Source: Ambulatory Visit | Attending: Physical Medicine & Rehabilitation | Admitting: Physical Medicine & Rehabilitation

## 2013-09-04 DIAGNOSIS — M170 Bilateral primary osteoarthritis of knee: Secondary | ICD-10-CM

## 2013-09-04 DIAGNOSIS — M797 Fibromyalgia: Secondary | ICD-10-CM

## 2013-09-04 DIAGNOSIS — G894 Chronic pain syndrome: Secondary | ICD-10-CM

## 2013-09-09 ENCOUNTER — Telehealth: Payer: Self-pay | Admitting: Physical Medicine & Rehabilitation

## 2013-09-09 NOTE — Telephone Encounter (Signed)
Please let pt know there are minimal findings on her lumbar spine xray----thanks

## 2013-09-10 NOTE — Telephone Encounter (Signed)
Patient advised of minimal findings on her lumbar spine xray.

## 2013-10-04 ENCOUNTER — Encounter: Payer: Self-pay | Admitting: Internal Medicine

## 2013-10-04 ENCOUNTER — Ambulatory Visit (INDEPENDENT_AMBULATORY_CARE_PROVIDER_SITE_OTHER): Payer: Medicare Other | Admitting: Internal Medicine

## 2013-10-04 VITALS — BP 175/100 | HR 105 | Temp 99.2°F | Ht 63.0 in | Wt 187.9 lb

## 2013-10-04 DIAGNOSIS — J029 Acute pharyngitis, unspecified: Secondary | ICD-10-CM

## 2013-10-04 DIAGNOSIS — I10 Essential (primary) hypertension: Secondary | ICD-10-CM

## 2013-10-04 DIAGNOSIS — R000 Tachycardia, unspecified: Secondary | ICD-10-CM

## 2013-10-04 LAB — GLUCOSE, CAPILLARY: Glucose-Capillary: 87 mg/dL (ref 70–99)

## 2013-10-04 LAB — RAPID STREP SCREEN (MED CTR MEBANE ONLY): Streptococcus, Group A Screen (Direct): NEGATIVE

## 2013-10-04 MED ORDER — METOPROLOL TARTRATE 12.5 MG HALF TABLET: 12.5000 mg | ORAL_TABLET | Freq: Two times a day (BID) | ORAL | Status: DC

## 2013-10-04 MED ORDER — AMOXICILLIN 500 MG PO CAPS: 1000.0000 mg | ORAL_CAPSULE | Freq: Every day | ORAL | Status: DC

## 2013-10-04 NOTE — Patient Instructions (Signed)
General Instructions: We will be prescribing a medication called amoxicillin, take 2 tablets once a day for 10 days.  You can take tylenol for your headache.  Also takes lots of fruits, get and take over the counter vitamin C, this should help with the sore at the side of your mouth.   Thank you for bringing your medicines today. This helps [place] keep you safe from mistakes.

## 2013-10-04 NOTE — Progress Notes (Signed)
Patient ID: Morgan Wilkerson, female   DOB: 1955-09-19, 58 y.o.   MRN: [id number]   Subjective:   [name]   DOB: 07/25/1955 58 y.o.   MRN: [id number]  HPI: [name] is a 58 y.o.  Presented today with c/o sore-throat with difficulty swallowing, for the past 3 days, no congestion but mild cough present. Body aches, present also for the past 3 days, though pt has fibromyalgia. Says she felt hot, broke out in a sweat, has night sweats, with chills- this also for the past three days. Endorses reduced appetite with frontal headache off and on since friday. Denies  Having sick contacts. Took tylenol and sinus pills- with some relief, also throat lozenges and anesthetic sore throat spray- also with some relief. Pt also notes having a small blister at the side of her mouth, this morning, pt dosen't take multivitamins, says she cannot tolerate oranges or citrus fruits, generally dosen't take fruits or vegetables .No diarrhea.    Past Medical History  Diagnosis Date  . Essential hypertension   . Toe fracture 2001/2002    Left pinky toe  . Diabetes mellitus 6/09  . Gastritis     Chronic, per biopsy  . Depression   . Seasonal allergic rhinitis     Spring time  . Gastroesophageal reflux disease   . Anxiety     Panic disorder  . Anemia     Anemia of chronic disease  . Osteoarthritis of both knees     Tricompartmental, worse in lateral compartment  . Osteoarthritis of both shoulders     Mild  . Fibromyalgia   . Chronic pain syndrome   . Osteopenia     DEXA 05/06/2008: L spine T -1.0, Left femur T -0.9, Right femur T -1.1  . Inappropriate sinus tachycardia    Current Outpatient Prescriptions  Medication Sig Dispense Refill  . baclofen (LIORESAL) 10 MG tablet Take 1 tablet (10 mg total) by mouth 3 (three) times daily as needed (muscle spasms).  30 tablet  11  . diclofenac sodium (VOLTAREN) 1 % GEL Apply 2 g topically 4 (four) times daily. To your knees  3 Tube  4  .  doxycycline (VIBRA-TABS) 100 MG tablet Take 1 tablet (100 mg total) by mouth 2 (two) times daily.  14 tablet  0  . gabapentin (NEURONTIN) 100 MG capsule Take 1 capsule (100 mg total) by mouth 3 (three) times daily.  90 capsule  4  . HYDROcodone-acetaminophen (NORCO) 10-325 MG per tablet Take 1 tablet by mouth every 6 (six) hours as needed. For pain  120 tablet  0  . loratadine (CLARITIN) 10 MG tablet TAKE ONE TABLET BY MOUTH ONCE DAILY  30 tablet  11  . methadone (DOLOPHINE) 10 MG tablet Take 2 tablets (20 mg total) by mouth 2 (two) times daily.  120 tablet  0  . metoprolol tartrate (LOPRESSOR) 12.5 mg TABS tablet Take 0.5 tablets (12.5 mg total) by mouth 2 (two) times daily.  60 tablet  1  . POTASSIUM PO Take 99 mg by mouth daily.      . promethazine (PHENERGAN) 12.5 MG tablet Take 1 tablet (12.5 mg total) by mouth every 8 (eight) hours as needed for nausea.  30 tablet  3  . ranitidine (ZANTAC) 300 MG tablet Take 1 tablet (300 mg total) by mouth at bedtime.  30 tablet  11  . venlafaxine (EFFEXOR) 75 MG tablet Take three tabs in the morning and two  at night  150 tablet  11   No current facility-administered medications for this visit.   Family History  Problem Relation Age of Onset  . Hypertension Mother   . Cerebral aneurysm Maternal Aunt   . Early death Father 37    Raod accident  . Healthy Sister   . Healthy Brother   . Healthy Daughter   . Cerebral aneurysm Maternal Grandfather   . Cerebral aneurysm Maternal Aunt   . Healthy Sister   . Healthy Sister   . Healthy Sister   . Healthy Sister   . Healthy Brother   . Healthy Brother   . Healthy Grandchild    History   Social History  . Marital Status: Married    Spouse Name: N/A    Number of Children: N/A  . Years of Education: 14   Occupational History  .     Social History Main Topics  . Smoking status: Never Smoker   . Smokeless tobacco: Never Used  . Alcohol Use: No     Comment: Rarely.   . Drug Use: No  . Sexual  Activity: No   Other Topics Concern  . None   Social History Narrative   Patients phone number is [phone]   Review of Systems: CONSTITUTIONAL- Endorses reduced  appetite. SKIN- No Rash, colour changes or itching. HEAD- No dizziness. EYES- No Vision loss, pain, redness, double or blurred vision. EARS- No vertigo, hearing loss or ear discharge. Mouth/throat- No Sorethroat, dentures, or bleeding gums. CARDIAC- No Palpitations, DOE, PND or chest pain. GI- No nausea, vomiting, diarrhoea, constipation, abd pain. URINARY- No Frequency, urgency, straining or dysuria. NEUROLOGIC- No Numbness, syncope, seizures or burning. [employer] [place]- Denies depression or anxiety.   Objective:  Physical Exam: Filed Vitals:   10/04/13 1421  BP: 175/100  Pulse: 105  Temp: 99.2 F (37.3 C)  TempSrc: Oral  Height: 5\' 3"  (1.6 m)  Weight: 187 lb 14.4 oz (85.231 kg)  SpO2: 100%   GENERAL- alert, co-operative, appears as stated age, not in any distress. HEENT- Atraumatic, normocephalic, PERRL, EOMI, oral mucosa appears moist, small blister at the side of her mouth, bilateral tonsilar erythema, with very minimal white spots on the right that appear to be exudates, pharynx does not appear erythematous, cervical adenopathy not appreciable, neck supple. CARDIAC- RRR, no murmurs, rubs or gallops. RESP- Moving equal volumes of air, and clear to auscultation bilaterally, no wheezes or crackles. ABDOMEN- Soft, nontender, no guarding or rebound, no palpable masses or organomegaly, bowel sounds present. BACK- Normal curvature of the spine, No tenderness along the vertebrae, no CVA tenderness. NEURO- No obvious Cr N abnormality, strenght upper and lower extremities- 5/5, Gait- Normal. EXTREMITIES- pulse 2+, symmetric, no pedal edema. SKIN- Warm, dry, No rash or lesion. PSYCH- Normal mood and affect, appropriate thought content and speech.  Assessment & Plan:   The patient's case and plan of care was discussed with  attending physician, Dr. Gwynne Edinger.  Please see problem based charting for assessment and plan.

## 2013-10-06 DIAGNOSIS — J029 Acute pharyngitis, unspecified: Secondary | ICD-10-CM | POA: Insufficient documentation

## 2013-10-06 NOTE — Assessment & Plan Note (Addendum)
Pt with Sorethroat, dysphagia, reported fever with chills- temp in clinic- 99.2, mild erythema on bilat tonsils, with small exudates, no cough or congestion. Likely infectious- Viral Vs Bacterial. Considering presence of minimal exudates- persistent after pt rinsed out her mouth, will start antibiotic therapy.  Plan- Rapid strep screen. - Amoxicillin- 1000mg  BID for 10 days.

## 2013-10-06 NOTE — Assessment & Plan Note (Addendum)
BP Readings from Last 3 Encounters:  10/04/13 175/100  08/26/13 185/96  08/22/13 150/94    Lab Results  Component Value Date   NA 140 05/04/2012   K 3.6 05/04/2012   CREATININE 0.66 05/04/2012    Assessment: Blood pressure control:  Initially elevated, On repeat BP- 145/90.  Progress toward BP goal:   slight elevation Comments: Not on BB. Pt is tachycardic, and has a hx of this and so Was told to take lopressor- 12.5mg  BID in other to prevent tachycardic cardiomyopathy, but pt says she cannot remember being told this and so hasn't been taking this medication.   Plan: Medications:  Start lopressor 12.5mg  BID, paper prescription given to patient, as it appears this medication cannot be e-prescribed.

## 2013-10-07 LAB — CULTURE, GROUP A STREP: ORGANISM ID, BACTERIA: NORMAL

## 2013-10-08 NOTE — Progress Notes (Signed)
Case discussed with Dr. Emokpae at the time of the visit.  We reviewed the resident's history and exam and pertinent patient test results.  I agree with the assessment, diagnosis, and plan of care documented in the resident's note. 

## 2013-10-10 ENCOUNTER — Telehealth: Payer: Self-pay

## 2013-10-10 NOTE — Telephone Encounter (Signed)
Attempted to contact patient. No answer nor voicemail. Patient needs a appt with Zella Ball for med refill and assessment per Dr. Letta Pate.

## 2013-10-10 NOTE — Telephone Encounter (Signed)
Needs to be seen by NP for meds and assessment

## 2013-10-10 NOTE — Telephone Encounter (Signed)
Patient is out of her medication and is in sever pain and has a bad headache.  She would like to know what to do?  Tried to contact patient to make an appointment but her voicemail box is not set up.

## 2013-10-15 ENCOUNTER — Encounter (HOSPITAL_COMMUNITY): Payer: Self-pay | Admitting: General Practice

## 2013-10-15 ENCOUNTER — Inpatient Hospital Stay (HOSPITAL_COMMUNITY): Payer: Medicare Other

## 2013-10-15 ENCOUNTER — Other Ambulatory Visit: Payer: Self-pay | Admitting: Internal Medicine

## 2013-10-15 ENCOUNTER — Observation Stay (HOSPITAL_COMMUNITY)
Admission: AD | Admit: 2013-10-15 | Discharge: 2013-10-16 | Disposition: A | Discharge status: Home / Self Care | Payer: Medicare Other | Source: Ambulatory Visit | Attending: Internal Medicine | Admitting: Internal Medicine

## 2013-10-15 ENCOUNTER — Encounter: Payer: Self-pay | Admitting: Internal Medicine

## 2013-10-15 ENCOUNTER — Ambulatory Visit (INDEPENDENT_AMBULATORY_CARE_PROVIDER_SITE_OTHER): Payer: Medicare Other | Admitting: Internal Medicine

## 2013-10-15 VITALS — BP 120/74 | HR 93 | Temp 101.3°F | Ht 63.5 in | Wt 179.3 lb

## 2013-10-15 DIAGNOSIS — K219 Gastro-esophageal reflux disease without esophagitis: Secondary | ICD-10-CM | POA: Insufficient documentation

## 2013-10-15 DIAGNOSIS — F19939 Other psychoactive substance use, unspecified with withdrawal, unspecified: Secondary | ICD-10-CM | POA: Insufficient documentation

## 2013-10-15 DIAGNOSIS — E119 Type 2 diabetes mellitus without complications: Secondary | ICD-10-CM

## 2013-10-15 DIAGNOSIS — I10 Essential (primary) hypertension: Secondary | ICD-10-CM | POA: Diagnosis present

## 2013-10-15 DIAGNOSIS — G894 Chronic pain syndrome: Secondary | ICD-10-CM | POA: Diagnosis present

## 2013-10-15 DIAGNOSIS — R509 Fever, unspecified: Secondary | ICD-10-CM

## 2013-10-15 DIAGNOSIS — F411 Generalized anxiety disorder: Secondary | ICD-10-CM | POA: Insufficient documentation

## 2013-10-15 DIAGNOSIS — D72829 Elevated white blood cell count, unspecified: Secondary | ICD-10-CM | POA: Insufficient documentation

## 2013-10-15 DIAGNOSIS — M19011 Primary osteoarthritis, right shoulder: Secondary | ICD-10-CM

## 2013-10-15 DIAGNOSIS — F3289 Other specified depressive episodes: Secondary | ICD-10-CM | POA: Insufficient documentation

## 2013-10-15 DIAGNOSIS — J029 Acute pharyngitis, unspecified: Secondary | ICD-10-CM

## 2013-10-15 DIAGNOSIS — J329 Chronic sinusitis, unspecified: Secondary | ICD-10-CM | POA: Insufficient documentation

## 2013-10-15 DIAGNOSIS — D638 Anemia in other chronic diseases classified elsewhere: Secondary | ICD-10-CM | POA: Diagnosis present

## 2013-10-15 DIAGNOSIS — N390 Urinary tract infection, site not specified: Secondary | ICD-10-CM

## 2013-10-15 DIAGNOSIS — R9431 Abnormal electrocardiogram [ECG] [EKG]: Secondary | ICD-10-CM

## 2013-10-15 DIAGNOSIS — M797 Fibromyalgia: Secondary | ICD-10-CM

## 2013-10-15 DIAGNOSIS — F329 Major depressive disorder, single episode, unspecified: Secondary | ICD-10-CM | POA: Insufficient documentation

## 2013-10-15 DIAGNOSIS — M170 Bilateral primary osteoarthritis of knee: Secondary | ICD-10-CM

## 2013-10-15 DIAGNOSIS — F410 Panic disorder [episodic paroxysmal anxiety] without agoraphobia: Secondary | ICD-10-CM | POA: Insufficient documentation

## 2013-10-15 DIAGNOSIS — M1710 Unilateral primary osteoarthritis, unspecified knee: Secondary | ICD-10-CM | POA: Insufficient documentation

## 2013-10-15 DIAGNOSIS — M19019 Primary osteoarthritis, unspecified shoulder: Secondary | ICD-10-CM | POA: Insufficient documentation

## 2013-10-15 DIAGNOSIS — R51 Headache: Secondary | ICD-10-CM | POA: Insufficient documentation

## 2013-10-15 DIAGNOSIS — M19012 Primary osteoarthritis, left shoulder: Secondary | ICD-10-CM

## 2013-10-15 HISTORY — DX: Procedure and treatment not carried out because of patient's decision for reasons of belief and group pressure: Z53.1

## 2013-10-15 HISTORY — DX: Reserved for inherently not codable concepts without codable children: IMO0001

## 2013-10-15 HISTORY — DX: Migraine, unspecified, not intractable, without status migrainosus: G43.909

## 2013-10-15 HISTORY — DX: Other chronic pain: G89.29

## 2013-10-15 HISTORY — DX: Dorsalgia, unspecified: M54.9

## 2013-10-15 HISTORY — DX: Type 2 diabetes mellitus without complications: E11.9

## 2013-10-15 LAB — BASIC METABOLIC PANEL WITH GFR
BUN: 16 mg/dL (ref 6–23)
CALCIUM: 9.9 mg/dL (ref 8.4–10.5)
CO2: 28 mEq/L (ref 19–32)
Chloride: 97 mEq/L (ref 96–112)
Creat: 0.91 mg/dL (ref 0.50–1.10)
GFR, Est African American: 80 mL/min
GFR, Est Non African American: 69 mL/min
GLUCOSE: 110 mg/dL — AB (ref 70–99)
POTASSIUM: 4.7 meq/L (ref 3.5–5.3)
SODIUM: 137 meq/L (ref 135–145)

## 2013-10-15 LAB — URINALYSIS, ROUTINE W REFLEX MICROSCOPIC
Bilirubin Urine: NEGATIVE
Glucose, UA: NEGATIVE mg/dL
Ketones, ur: NEGATIVE mg/dL
NITRITE: NEGATIVE
Protein, ur: NEGATIVE mg/dL
SPECIFIC GRAVITY, URINE: 1.015 (ref 1.005–1.030)
Urobilinogen, UA: 0.2 mg/dL (ref 0.0–1.0)
pH: 7 (ref 5.0–8.0)

## 2013-10-15 LAB — CBC WITH DIFFERENTIAL/PLATELET
Basophils Absolute: 0 10*3/uL (ref 0.0–0.1)
Basophils Relative: 0 % (ref 0–1)
Eosinophils Absolute: 0 10*3/uL (ref 0.0–0.7)
Eosinophils Relative: 0 % (ref 0–5)
HEMATOCRIT: 35.6 % — AB (ref 36.0–46.0)
HEMOGLOBIN: 11.7 g/dL — AB (ref 12.0–15.0)
LYMPHS ABS: 2.6 10*3/uL (ref 0.7–4.0)
Lymphocytes Relative: 13 % (ref 12–46)
MCH: 28.5 pg (ref 26.0–34.0)
MCHC: 32.9 g/dL (ref 30.0–36.0)
MCV: 86.8 fL (ref 78.0–100.0)
MONOS PCT: 4 % (ref 3–12)
Monocytes Absolute: 0.8 10*3/uL (ref 0.1–1.0)
NEUTROS PCT: 83 % — AB (ref 43–77)
Neutro Abs: 16.8 10*3/uL — ABNORMAL HIGH (ref 1.7–7.7)
Platelets: 278 10*3/uL (ref 150–400)
RBC: 4.1 MIL/uL (ref 3.87–5.11)
RDW: 14.4 % (ref 11.5–15.5)
WBC: 20.2 10*3/uL — ABNORMAL HIGH (ref 4.0–10.5)

## 2013-10-15 LAB — URINALYSIS, MICROSCOPIC ONLY
CRYSTALS: NONE SEEN
Casts: NONE SEEN

## 2013-10-15 LAB — SEDIMENTATION RATE: Sed Rate: 119 mm/hr — ABNORMAL HIGH (ref 0–22)

## 2013-10-15 LAB — POCT URINALYSIS DIPSTICK
Bilirubin, UA: NEGATIVE
Glucose, UA: NEGATIVE
Ketones, UA: NEGATIVE
Nitrite, UA: NEGATIVE
PH UA: 7
Spec Grav, UA: 1.015
Urobilinogen, UA: 0.2

## 2013-10-15 LAB — MONONUCLEOSIS SCREEN: MONO SCREEN: NEGATIVE

## 2013-10-15 LAB — POCT GLYCOSYLATED HEMOGLOBIN (HGB A1C): Hemoglobin A1C: 5.5

## 2013-10-15 LAB — GLUCOSE, CAPILLARY: GLUCOSE-CAPILLARY: 105 mg/dL — AB (ref 70–99)

## 2013-10-15 MED ORDER — ACETAMINOPHEN 650 MG RE SUPP: 650.0000 mg | Freq: Four times a day (QID) | RECTAL | Status: DC | PRN

## 2013-10-15 MED ORDER — FAMOTIDINE 40 MG PO TABS: 40.0000 mg | ORAL_TABLET | Freq: Every day | ORAL | Status: DC

## 2013-10-15 MED ORDER — ENSURE COMPLETE PO LIQD
237.0000 mL | Freq: Two times a day (BID) | ORAL | Status: DC
  Administered 2013-10-16: 237 mL via ORAL

## 2013-10-15 MED ORDER — ACETAMINOPHEN 325 MG PO TABS
650.0000 mg | ORAL_TABLET | Freq: Four times a day (QID) | ORAL | Status: DC | PRN
  Administered 2013-10-15: 650 mg via ORAL

## 2013-10-15 MED ORDER — BACLOFEN 10 MG PO TABS: 10.0000 mg | ORAL_TABLET | Freq: Three times a day (TID) | ORAL | Status: DC | PRN

## 2013-10-15 MED ORDER — VENLAFAXINE HCL 75 MG PO TABS
150.0000 mg | ORAL_TABLET | Freq: Every day | ORAL | Status: DC
  Administered 2013-10-16: 150 mg via ORAL
  Filled 2013-10-15: qty 2

## 2013-10-15 MED ORDER — ENOXAPARIN SODIUM 40 MG/0.4ML ~~LOC~~ SOLN
40.0000 mg | SUBCUTANEOUS | Status: DC
  Administered 2013-10-15: 40 mg via SUBCUTANEOUS
  Filled 2013-10-15 (×2): qty 0.4

## 2013-10-15 MED ORDER — METOPROLOL TARTRATE 12.5 MG HALF TABLET
12.5000 mg | ORAL_TABLET | Freq: Two times a day (BID) | ORAL | Status: DC
  Administered 2013-10-15 – 2013-10-16 (×2): 12.5 mg via ORAL
  Filled 2013-10-15 (×3): qty 1

## 2013-10-15 MED ORDER — VENLAFAXINE HCL 75 MG PO TABS: 75.0000 mg | ORAL_TABLET | Freq: Every morning | ORAL | Status: DC

## 2013-10-15 MED ORDER — IOHEXOL 300 MG/ML  SOLN
75.0000 mL | Freq: Once | INTRAMUSCULAR | Status: AC | PRN
  Administered 2013-10-15: 75 mL via INTRAVENOUS

## 2013-10-15 MED ORDER — ACETAMINOPHEN 325 MG PO TABS
650.0000 mg | ORAL_TABLET | Freq: Four times a day (QID) | ORAL | Status: DC | PRN
  Administered 2013-10-16: 650 mg via ORAL
  Filled 2013-10-15: qty 2

## 2013-10-15 MED ORDER — METHADONE HCL 10 MG PO TABS
20.0000 mg | ORAL_TABLET | Freq: Every day | ORAL | Status: DC
  Administered 2013-10-16: 20 mg via ORAL
  Filled 2013-10-15: qty 2

## 2013-10-15 MED ORDER — HYDROCODONE-ACETAMINOPHEN 10-325 MG PO TABS
1.0000 | ORAL_TABLET | Freq: Four times a day (QID) | ORAL | Status: DC | PRN
  Administered 2013-10-15 – 2013-10-16 (×3): 1 via ORAL
  Filled 2013-10-15 (×3): qty 1

## 2013-10-15 MED ORDER — GABAPENTIN 100 MG PO CAPS: 100.0000 mg | ORAL_CAPSULE | Freq: Three times a day (TID) | ORAL | Status: DC

## 2013-10-15 MED ORDER — DOXYCYCLINE HYCLATE 100 MG PO TABS
100.0000 mg | ORAL_TABLET | Freq: Two times a day (BID) | ORAL | Status: DC
  Administered 2013-10-15 – 2013-10-16 (×2): 100 mg via ORAL
  Filled 2013-10-15 (×3): qty 1

## 2013-10-15 MED ORDER — VENLAFAXINE HCL 75 MG PO TABS
225.0000 mg | ORAL_TABLET | Freq: Every day | ORAL | Status: DC
  Administered 2013-10-16: 225 mg via ORAL
  Filled 2013-10-15 (×2): qty 3

## 2013-10-15 MED ORDER — VENLAFAXINE HCL 50 MG PO TABS: 50.0000 mg | ORAL_TABLET | Freq: Every day | ORAL | Status: DC

## 2013-10-15 MED ORDER — BIOTENE DRY MOUTH MT LIQD
15.0000 mL | Freq: Two times a day (BID) | OROMUCOSAL | Status: DC
Start: 2013-10-15 — End: 2013-10-16
  Administered 2013-10-16 (×2): 15 mL via OROMUCOSAL

## 2013-10-15 NOTE — Assessment & Plan Note (Signed)
Patient w/ fever for 3-4 days, today 101.2 on exam. Patient admits to associated chills and questionable hallucinations as described by the patient. UA shows large leukocytes, no urinary symptoms on exam. CBC w/ leukocytosis of 20.2.  -ESR, CRP pending -HIV -CXR -Blood/urine cultures pending -Patient to be admitted by IMT[facility]

## 2013-10-15 NOTE — Assessment & Plan Note (Signed)
Controlled today, 120/74. Compliant w/ Lopressor 12.5 mg bid. No tachycardia on exam.

## 2013-10-15 NOTE — Progress Notes (Signed)
Report called to [place] on [address].  Patient transported via wheelchair to [place] in attendance.  Sander Nephew, RN 10/15/2013 5:36 PM.

## 2013-10-15 NOTE — Assessment & Plan Note (Addendum)
Patient w/ WBC's count of 20.2 during clinic visit today. Febrile to 101.2 on exam. Patient denies cough, chest pain, diarrhea, dysuria, or abdominal pain. UA however, did show large leukocytes. Patient has a questionable rheumatologic disease that she has been worked up for in the past, but diagnosis is unknown. Given headache and temporal tenderness on exam, question Temporal Arteritis (questioned in the past). Patient w/ recent weight loss as well. Denies any recent change in vision. Given Tylenol 650 mg in clinic.  -ESR, CRP pending -CXR 2V pending -HIV pending -Urine microscopy and culture pending -Blood cultures x2 pending -Patient to be admitted to [facility]

## 2013-10-15 NOTE — Progress Notes (Signed)
Subjective:   [name]   DOB: 1955/07/21 58 y.o.   MRN: [id number]  HPI: [name] is a 58 y.o. female w/ PMHx of HTN, GERD, DM type II, chronic pain ,and anxiety, presents to the clinic today for a follow-up visit. Patient was recently seen in the clinic on 10/06/13 for sore throat, dysphagia, and mild cough, found to be strep negative, given a 10-day course of amoxicillin. Today, patient presents w/ fever of 101.3, continued sore throat, significant headache, chills, and general malaise. Patient denies cough, chest pain, SOB, dysuria, abdominal pain, diarrhea, dizziness, or palpitations. She does state that she has a significant headache, but denies neck or back pain. She claims to have photophobia, but does not seem to have light sensitivity on exam. No obvious nuchal rigidity, or spinal tenderness.  Patient states she has had fever for 4-5 days (while taking Amoxicillin), which has cause her to have nightmares and she also claims "hallucinations". Mostly associated w/ sleeping. Patient w/ no obvious confusion or altered mental status on exam.  Patient has a questionable history of rheumatologic disease w/ elevated ESR and CRP in the past. Patient sees Dr. Naaman Plummer for chronic pain and takes Methadone and Norco at home, but states she has not been taking this for 3 weeks as she has not been scheduled for a follow up appointment yet.   Past Medical History  Diagnosis Date  . Essential hypertension   . Toe fracture 2001/2002    Left pinky toe  . Diabetes mellitus 6/09  . Gastritis     Chronic, per biopsy  . Depression   . Seasonal allergic rhinitis     Spring time  . Gastroesophageal reflux disease   . Anxiety     Panic disorder  . Anemia     Anemia of chronic disease  . Osteoarthritis of both knees     Tricompartmental, worse in lateral compartment  . Osteoarthritis of both shoulders     Mild  . Fibromyalgia   . Chronic pain syndrome   . Osteopenia     DEXA 05/06/2008: L spine T -1.0, Left femur T -0.9, Right femur T -1.1  . Inappropriate sinus tachycardia    Current Outpatient Prescriptions  Medication Sig Dispense Refill  . amoxicillin (AMOXIL) 500 MG capsule Take 2 capsules (1,000 mg total) by mouth daily.  20 capsule  0  . baclofen (LIORESAL) 10 MG tablet Take 1 tablet (10 mg total) by mouth 3 (three) times daily as needed (muscle spasms).  30 tablet  11  . diclofenac sodium (VOLTAREN) 1 % GEL Apply 2 g topically 4 (four) times daily. To your knees  3 Tube  4  . doxycycline (VIBRA-TABS) 100 MG tablet Take 1 tablet (100 mg total) by mouth 2 (two) times daily.  14 tablet  0  . gabapentin (NEURONTIN) 100 MG capsule Take 1 capsule (100 mg total) by mouth 3 (three) times daily.  90 capsule  4  . HYDROcodone-acetaminophen (NORCO) 10-325 MG per tablet Take 1 tablet by mouth every 6 (six) hours as needed. For pain  120 tablet  0  . loratadine (CLARITIN) 10 MG tablet TAKE ONE TABLET BY MOUTH ONCE DAILY  30 tablet  11  . methadone (DOLOPHINE) 10 MG tablet Take 2 tablets (20 mg total) by mouth 2 (two) times daily.  120 tablet  0  . metoprolol tartrate (LOPRESSOR) 12.5 mg TABS tablet Take 0.5 tablets (12.5 mg total) by mouth 2 (  two) times daily.  60 tablet  1  . POTASSIUM PO Take 99 mg by mouth daily.      . promethazine (PHENERGAN) 12.5 MG tablet Take 1 tablet (12.5 mg total) by mouth every 8 (eight) hours as needed for nausea.  30 tablet  3  . ranitidine (ZANTAC) 300 MG tablet Take 1 tablet (300 mg total) by mouth at bedtime.  30 tablet  11  . venlafaxine (EFFEXOR) 75 MG tablet Take three tabs in the morning and two at night  150 tablet  11   No current facility-administered medications for this visit.   Family History  Problem Relation Age of Onset  . Hypertension Mother   . Cerebral aneurysm Maternal Aunt   . Early death Father 72    Raod accident  . Healthy Sister   . Healthy Brother   . Healthy Daughter   . Cerebral aneurysm Maternal  Grandfather   . Cerebral aneurysm Maternal Aunt   . Healthy Sister   . Healthy Sister   . Healthy Sister   . Healthy Sister   . Healthy Brother   . Healthy Brother   . Healthy Grandchild    History   Social History  . Marital Status: Married    Spouse Name: N/A    Number of Children: N/A  . Years of Education: 14   Occupational History  .     Social History Main Topics  . Smoking status: Never Smoker   . Smokeless tobacco: Never Used  . Alcohol Use: No     Comment: Rarely.   . Drug Use: No  . Sexual Activity: No   Other Topics Concern  . None   Social History Narrative   Patients phone number is [phone]    Review of Systems: General: Positive for fever, chills, decreased appetite, and fatigue. Denies diaphoresis.  Respiratory: Denies SOB, DOE, cough, chest tightness, and wheezing.   Cardiovascular: Denies chest pain and palpitations.  Gastrointestinal: Positive for nausea. Denies vomiting, abdominal pain, diarrhea, constipation, blood in stool and abdominal distention.  Genitourinary: Denies dysuria, urgency, frequency, hematuria, and flank pain. Endocrine: Denies hot or cold intolerance, polyuria, and polydipsia. Musculoskeletal: Positive for chronic myalgias and joint pain. Denies back pain, joint swelling, and gait problem.  Skin: Denies pallor, rash and wounds.  Neurological: Positive for weakness and headaches. Denies dizziness, seizures, syncope, lightheadedness, numbness.  Psychiatric/Behavioral: Positive for sleep disturbance. Denies mood changes, confusion, nervousness, and agitation.  Objective:   Physical Exam: Filed Vitals:   10/15/13 1449  BP: 120/74  Pulse: 93  Temp: 101.3 F (38.5 C)  TempSrc: Oral  Height: 5' 3.5" (1.613 m)  Weight: 179 lb 4.8 oz (81.33 kg)  SpO2: 98%   General: Vital signs reviewed.  Patient is a well-developed and well-nourished, in no acute distress and cooperative with exam. Ill appearing.  Head: Normocephalic  and atraumatic. Eyes: PERRL, EOMI, conjunctivae normal, No scleral icterus.  Neck: Supple, tenderness to palpation, no obvious lymphadenopathy. Trachea midline, normal ROM, No JVD, masses, thyromegaly, or carotid bruit present. Throat w/ mild tonsillar erythema and swelling, no obvious exudates.  Cardiovascular: RRR, S1 normal, S2 normal, no murmurs, gallops, or rubs. Pulmonary/Chest: Air entry equal bilaterally, no wheezes, rales, or rhonchi. Abdominal: Soft, mild tenderness to palpation (patient says this is chronic), non-distended, BS +, no masses, organomegaly, or guarding present.  Musculoskeletal: No joint deformities, erythema, or stiffness, ROM full and nontender. Extremities: No swelling or edema,  pulses symmetric and intact bilaterally. No cyanosis  or clubbing. Neurological: A&O x3, Strength is normal and symmetric bilaterally, cranial nerve II-XII are grossly intact, no focal motor deficit, sensory intact to light touch bilaterally.  Skin: Warm, dry and intact. No rashes or erythema. Psychiatric: Normal mood and affect. Speech and behavior is normal. Cognition and memory are normal.   Assessment & Plan:   Please see problem based assessment and plan.

## 2013-10-15 NOTE — Patient Instructions (Addendum)
General Instructions:  1. Please schedule a follow up appointment on Friday, 10/18/13  If you continue to have significant fever, chills, worsening headache, neck pain or stiffness, or vomiting, please call [phone]) or come to the ED.   2. Please take all medications as prescribed. Take Tylenol prn for fever; DO NOT TAKE MORE THAN 3000 mg daily.   Please try to bring all your medicines next time. This will help [place] keep you safe from mistakes.  Progress Toward Treatment Goals:  Treatment Goal 10/15/2013  Hemoglobin A1C at goal  Blood pressure at goal  Prevent falls at goal    Self Care Goals & Plans:  Self Care Goal 10/15/2013  Manage my medications take my medicines as prescribed; refill my medications on time; bring my medications to every visit  Monitor my health keep track of my blood glucose  Eat healthy foods drink diet soda or water instead of juice or soda; eat more vegetables; eat foods that are low in salt; eat baked foods instead of fried foods  Be physically active take a walk every day  Prevent falls -  Meeting treatment goals -    Home Blood Glucose Monitoring 10/15/2013  Check my blood sugar no home glucose monitoring  When to check my blood sugar N/A     Care Management & Community Referrals:  Referral 10/15/2013  Referrals made for care management support none needed  Referrals made to community resources -

## 2013-10-15 NOTE — Assessment & Plan Note (Addendum)
Patient still with sore throat, s/p full course of Amoxicillin. Rapid strep and throat cultures found to be negative at previous visit. Patient now w/ fever of 101.2, claims she has had a significant fever at home, accompanied by chills as well. No cough, no obvious exudates, only mild erythema and swelling. CBC w/ leukocytosis of 20.2, possibly unrelated to questionable pharyngitis at this time. -Admit to IMTS for further ecvaluation

## 2013-10-15 NOTE — Progress Notes (Signed)
Pt admitted to unit from out pt clinic no c/o cp or sob. Will monitor pt and await orders from MD.

## 2013-10-15 NOTE — H&P (Signed)
Date: 10/15/2013               Patient Name:  Morgan Wilkerson MRN: [id number]  DOB: 1956-01-25 Age / Sex: 58 y.o., female   PCP: Karren Cobble, MD         Medical Service: Internal Medicine Teaching Service         Attending Physician: Dr. Bartholomew Crews, MD    First Contact: Dr. Johnnette Gourd, MD Pager: [phone]  Second Contact: Dr. Randell Loop, MD Pager: [phone]       After Hours (After 5p/  First Contact Pager: [phone]  weekends / holidays): Second Contact Pager: [phone]   Chief Complaint: Sore throat  History of Present Illness: Morgan Wilkerson is a 58 y.o. woman with a pmhx of chronic pain syndrome, FM, Anxiety, who presented to [facility] earlier today. She was recently diagnosed with pharyngitis and was given a 10 day course of amoxicillin. She had a partial response to this treatment. She has continued sore throat. She notes a change in voice and pain with opening her mouth. She has been able to swallow fluids and pills, but admits to decreased food intake due to anorexia and pain with swallowing.   Of note, the patient describes running out out of pain meds due to losing her refill prescription. She contacted her pain management physician Dr. Naaman Plummer on 6/4 for worsening pain and no pain meds. She describes chills, malaise, fatigue, and headache since running out of her pains meds. It is a little unclear as to what meds the patient takes at home for her chronic pain. Per Dr. Charm Barges note, the patient is given 10 mg methadone BID and norco QID. Per the patient she takes 1 methodone pill a day and 2 narco's a day. The headache is located across the fronr of her forehead.  She denies chest pain, cough, abdominal pain, constipation or diarrhea. Denies any wounds.  Denies drug use, alcohol use, cig use.  Of note, the patient complains of hallucinations and confusion while taking amoxicillin, which has now resolved.   Meds: Current Facility-Administered Medications  Medication Dose  Route Frequency Provider Last Rate Last Dose  . acetaminophen (TYLENOL) tablet 650 mg  650 mg Oral Q6H PRN Olga Millers, MD       Or  . acetaminophen (TYLENOL) suppository 650 mg  650 mg Rectal Q6H PRN Olga Millers, MD      . enoxaparin (LOVENOX) injection 40 mg  40 mg Subcutaneous Q24H Olga Millers, MD      . famotidine (PEPCID) tablet 40 mg  40 mg Oral QHS Olga Millers, MD      . Derrill Memo ON 10/16/2013] feeding supplement (ENSURE COMPLETE) (ENSURE COMPLETE) liquid 237 mL  237 mL Oral BID BM Olga Millers, MD      . gabapentin (NEURONTIN) capsule 100 mg  100 mg Oral TID Olga Millers, MD      . HYDROcodone-acetaminophen [facility]) 10-325 MG per tablet 1 tablet  1 tablet Oral Q6H PRN Olga Millers, MD      . metoprolol tartrate (LOPRESSOR) tablet 12.5 mg  12.5 mg Oral BID Olga Millers, MD      . venlafaxine [facility]) tablet 50 mg  50 mg Oral QHS Olga Millers, MD      . Derrill Memo ON 10/16/2013] venlafaxine [facility]) tablet 75 mg  75 mg Oral q morning - 10a Olga Millers, MD       Facility-Administered  Medications Ordered in Other Encounters  Medication Dose Route Frequency Provider Last Rate Last Dose  . acetaminophen (TYLENOL) tablet 650 mg  650 mg Oral Q6H PRN Corky Sox, MD   650 mg at 10/15/13 1634    Allergies: Allergies as of 10/15/2013 - Review Complete 10/15/2013  Allergen Reaction Noted  . Ibuprofen Itching 05/31/2011  . Methocarbamol  09/13/2012  . Propoxyphene n-acetaminophen Itching   . Citric acid Itching and Rash   . Oxycodone-acetaminophen Rash 08/19/2009   Past Medical History  Diagnosis Date  . Essential hypertension   . Toe fracture 2001/2002    Left pinky toe  . Gastritis     Chronic, per biopsy  . Depression   . Seasonal allergic rhinitis     Spring time  . Gastroesophageal reflux disease   . Anxiety     Panic disorder  . Anemia     Anemia of chronic disease  . Fibromyalgia   . Chronic pain syndrome   .  Osteopenia     DEXA 05/06/2008: L spine T -1.0, Left femur T -0.9, Right femur T -1.1  . Inappropriate sinus tachycardia   . Refusal of blood transfusions as patient is Jehovah's Witness   . Type II diabetes mellitus 6/09  . Migraine     "q 2-3 months" (10/15/2013)  . Osteoarthritis of both knees     Tricompartmental, worse in lateral compartment  . Osteoarthritis of both shoulders     Mild  . Chronic back pain    Past Surgical History  Procedure Laterality Date  . Temporal artery biopsy / ligation Left 6/09    Negative for temperal arteritis  . Cystoscopy      With hydrolic bladder distension  . Vaginal hysterectomy  2003    with oophorectomy, for dysfunctional uterine bleeding  . Cholecystectomy      for chronic acalculus cholecystitis  . Tubal ligation     Family History  Problem Relation Age of Onset  . Hypertension Mother   . Cerebral aneurysm Maternal Aunt   . Early death Father 59    Raod accident  . Healthy Sister   . Healthy Brother   . Healthy Daughter   . Cerebral aneurysm Maternal Grandfather   . Cerebral aneurysm Maternal Aunt   . Healthy Sister   . Healthy Sister   . Healthy Sister   . Healthy Sister   . Healthy Brother   . Healthy Brother   . Healthy Grandchild    History   Social History  . Marital Status: Married    Spouse Name: N/A    Number of Children: N/A  . Years of Education: 14   Occupational History  .     Social History Main Topics  . Smoking status: Never Smoker   . Smokeless tobacco: Never Used  . Alcohol Use: No     Comment: Rarely.   . Drug Use: No  . Sexual Activity: Not Currently   Other Topics Concern  . Not on file   Social History Narrative   Patients phone number is [phone]    Review of Systems: Pertinent items are noted in HPI.  Physical Exam: Blood pressure 126/83, pulse 100, temperature 99.5 F (37.5 C), temperature source Oral, resp. rate 18, height 5' 3.5" (1.613 m), weight 178 lb 12.7 oz (81.1 kg),  last menstrual period 03/08/2002, SpO2 100.00%. Physical Exam  Constitutional: She is oriented to person, place, and time. She appears well-developed and well-nourished. No distress.  HENT:  Posterior pharyngeal swelling bil. Tender cervical lymphadenopathy. No trismus. No uvula shift or obvious tonsillar abscess.  Non-tender in region of temporal artery.  Neck: Normal range of motion. Neck supple. No tracheal deviation present.  Cardiovascular: Normal rate, regular rhythm and intact distal pulses.  Exam reveals no friction rub.   Murmur heard. Systolic murmur loudis in RUSB, but present in all fields.   Pulmonary/Chest: Effort normal and breath sounds normal. No respiratory distress. She has no wheezes. She has no rales.  Abdominal: Soft. Bowel sounds are normal. She exhibits no distension. There is no tenderness. There is no rebound and no guarding.  Musculoskeletal: She exhibits no edema and no tenderness.  Lymphadenopathy:    She has cervical adenopathy.  Neurological: She is alert and oriented to person, place, and time.  Skin: She is not diaphoretic.  Psychiatric:  Depressed mood and affect     Lab results: Basic Metabolic Panel:  Recent Labs  10/15/13 1525  NA 137  K 4.7  CL 97  CO2 28  GLUCOSE 110*  BUN 16  CREATININE 0.91  CALCIUM 9.9   CBC:  Recent Labs  10/15/13 1525  WBC 20.2*  NEUTROABS 16.8*  HGB 11.7*  HCT 35.6*  MCV 86.8  PLT 278   CBG:  Recent Labs  10/15/13 1524  GLUCAP 105*   Hemoglobin A1C:  Recent Labs  10/15/13 1533  HGBA1C 5.5   Urine Drug Screen: Drugs of Abuse     Component Value Date/Time   LABOPIA POSITIVE* 01/26/2009 2134   COCAINSCRNUR NONE DETECTED 01/26/2009 2134   LABBENZ NONE DETECTED 01/26/2009 2134   AMPHETMU NONE DETECTED 01/26/2009 2134   THCU NONE DETECTED 01/26/2009 2134   LABBARB  Value: NONE DETECTED        DRUG SCREEN FOR MEDICAL PURPOSES ONLY.  IF CONFIRMATION IS NEEDED FOR ANY PURPOSE, NOTIFY LAB WITHIN 5  DAYS.        LOWEST DETECTABLE LIMITS FOR URINE DRUG SCREEN Drug Class       Cutoff (ng/mL) Amphetamine      1000 Barbiturate      200 Benzodiazepine   124 Tricyclics       580 Opiates          300 Cocaine          300 THC              50 01/26/2009 2134    Urinalysis:  Recent Labs  10/15/13 1624 10/15/13 1629  COLORURINE YELLOW  --   LABSPEC 1.015  --   PHURINE 7.0  --   GLUCOSEU NEG  --   HGBUR SMALL*  --   BILIRUBINUR NEG Negative  KETONESUR NEG  --   PROTEINUR NEG 30 mg  UROBILINOGEN 0.2 0.2  NITRITE NEG Negative  LEUKOCYTESUR SMALL* large (3+)     Imaging results:  Dg Chest 2 View  10/15/2013   CLINICAL DATA:  Leukocytosis.  EXAM: CHEST  2 VIEW  COMPARISON:  05/13/2011  FINDINGS: Normal heart size and stable aortic tortuosity. There is mild hypoaeration with interstitial coarsening. No definitive consolidation. No edema, effusion, or pneumothorax. Mild thoracolumbar dextroscoliosis.  IMPRESSION: No definitive pneumonia.   Electronically Signed   By: Jorje Guild M.D.   On: 10/15/2013 22:03   Ct Soft Tissue Neck W Contrast  10/15/2013   CLINICAL DATA:  Sore throat.  Rule out abscess.  EXAM: CT NECK WITH CONTRAST  TECHNIQUE: Multidetector CT imaging of the neck was performed using the standard  protocol following the bolus administration of intravenous contrast.  CONTRAST:  33mL OMNIPAQUE IOHEXOL 300 MG/ML  SOLN  COMPARISON:  None.  FINDINGS: There is symmetric enlargement and enhancement of tonsillar tissue, especially the adenoid tonsils. The adenoid tonsils were also enlarged on brain MR imaging 09/21/2007. There is bilateral sphenoid sinus obstruction with effusions and mucosal thickening. There is no abscess or retropharyngeal edema. Mild enlargement of bilateral cervical lymph nodes which is considered reactive. There is no venous compromise in the neck. Clear apical lungs. No acute osseous findings. Dental caries with periapical erosion noted around the left lower second premolar.   Negative salivary and thyroid glands.  No osseous erosion.  IMPRESSION: 1. Tonsillitis, superimposed on chronic enlargement of the adenoid tonsils. No abscess or retropharyngeal edema. 2. Bilateral sphenoid sinusitis.   Electronically Signed   By: Jorje Guild M.D.   On: 10/15/2013 21:59      Assessment & Plan by Problem: Principal Problem:   Fever, unspecified Active Problems:   Type II diabetes mellitus   Essential hypertension   Chronic pain syndrome   Anemia of chronic disease   Sore throat   Leukocytosis, unspecified  Pharyngitis and Sinusitis The patient has pharyngitis refractory to amoxicillin therapy of ten days. She has posterior pharyngeal erythema and swelling. She possible has a peritonsillar, neck abscess as she reports changes in voice and pain with opening mouth. Further she is febrile and with a leukocytosis. This the pharyngitis is likely infectious. Possible causes include EBV, bacterial pharyngitis, other viral pharyngitis, and abscess. CT head and neck showed bil sphenoidal sinusitis and tonsillitis, no abscess. - start doxycycline 100 mg BID for 14 days, as patient had failed penicillin class (amoxicillin at low dose) - Blood culture x 2 - F/U monospot - F/U HIV  Narcotic Withdrawal The patient has worsening pain, headache, chills, and depressed mood and affect. This is consistent to narcotic w/d. Temporal arteritis appears unlikely given TA is non-tender to palpation bil.  - plan to restart patients home Vicodin at qid prn - Will restart methadone at half home dose given discrepancy between self report and  Med list in Epic (20 mg qd)  DM II Appears stable. Most recent A1c is 5.5. She is on no meds at this time as outpatient or as inpatient.  HTN Patient is on metoprolol at home. Plan to continue on admission.  Depression The patient is on Effexor at home. Plan to continue home meds.   Diet: Carb Mod  DVT: Lovenox   Dispo: Disposition is deferred at  this time, awaiting improvement of current medical problems. Anticipated discharge in approximately 1 day(s).   The patient does have a current PCP Karren Cobble, MD) and does need an [facility] hospital follow-up appointment after discharge.  The patient does not have transportation limitations that hinder transportation to clinic appointments.  Signed: Marrion Coy, MD 10/15/2013, 9:14 PM

## 2013-10-15 NOTE — Assessment & Plan Note (Addendum)
HbA1c checked today, found to be 5.5. Not on any medications at this time.

## 2013-10-16 ENCOUNTER — Telehealth: Payer: Self-pay

## 2013-10-16 ENCOUNTER — Encounter: Payer: Self-pay | Admitting: Internal Medicine

## 2013-10-16 DIAGNOSIS — E119 Type 2 diabetes mellitus without complications: Secondary | ICD-10-CM

## 2013-10-16 DIAGNOSIS — F3289 Other specified depressive episodes: Secondary | ICD-10-CM

## 2013-10-16 DIAGNOSIS — F19939 Other psychoactive substance use, unspecified with withdrawal, unspecified: Secondary | ICD-10-CM

## 2013-10-16 DIAGNOSIS — J029 Acute pharyngitis, unspecified: Principal | ICD-10-CM

## 2013-10-16 DIAGNOSIS — F1920 Other psychoactive substance dependence, uncomplicated: Secondary | ICD-10-CM

## 2013-10-16 DIAGNOSIS — F329 Major depressive disorder, single episode, unspecified: Secondary | ICD-10-CM

## 2013-10-16 DIAGNOSIS — J329 Chronic sinusitis, unspecified: Secondary | ICD-10-CM

## 2013-10-16 DIAGNOSIS — I10 Essential (primary) hypertension: Secondary | ICD-10-CM

## 2013-10-16 LAB — CBC
HCT: 35 % — ABNORMAL LOW (ref 36.0–46.0)
HEMOGLOBIN: 11.4 g/dL — AB (ref 12.0–15.0)
MCH: 28.2 pg (ref 26.0–34.0)
MCHC: 32.6 g/dL (ref 30.0–36.0)
MCV: 86.6 fL (ref 78.0–100.0)
Platelets: 261 10*3/uL (ref 150–400)
RBC: 4.04 MIL/uL (ref 3.87–5.11)
RDW: 14.2 % (ref 11.5–15.5)
WBC: 12.7 10*3/uL — ABNORMAL HIGH (ref 4.0–10.5)

## 2013-10-16 LAB — HEPATIC FUNCTION PANEL
ALBUMIN: 3.4 g/dL — AB (ref 3.5–5.2)
ALT: 21 U/L (ref 0–35)
AST: 22 U/L (ref 0–37)
Alkaline Phosphatase: 95 U/L (ref 39–117)
Bilirubin, Direct: <0.2 mg/dL (ref 0.0–0.3)
TOTAL PROTEIN: 8.6 g/dL — AB (ref 6.0–8.3)
Total Bilirubin: 0.4 mg/dL (ref 0.3–1.2)

## 2013-10-16 LAB — C-REACTIVE PROTEIN: CRP: 23.1 mg/dL — ABNORMAL HIGH (ref <0.60)

## 2013-10-16 LAB — HIV ANTIBODY (ROUTINE TESTING W REFLEX): HIV 1&2 Ab, 4th Generation: NONREACTIVE

## 2013-10-16 MED ORDER — HYDROCODONE-ACETAMINOPHEN 10-325 MG PO TABS: 1.0000 | ORAL_TABLET | Freq: Four times a day (QID) | ORAL | Status: DC | PRN

## 2013-10-16 MED ORDER — DOXYCYCLINE HYCLATE 100 MG PO CAPS: 100.0000 mg | ORAL_CAPSULE | Freq: Two times a day (BID) | ORAL | Status: DC

## 2013-10-16 MED ORDER — METHADONE HCL 10 MG PO TABS: 20.0000 mg | ORAL_TABLET | Freq: Two times a day (BID) | ORAL | Status: DC

## 2013-10-16 MED ORDER — BACLOFEN 10 MG PO TABS
10.0000 mg | ORAL_TABLET | Freq: Three times a day (TID) | ORAL | Status: DC | PRN
  Filled 2013-10-16: qty 1

## 2013-10-16 NOTE — Progress Notes (Signed)
Chaplain responded to spiritual care consult for a visit from a [employer], asking pt if she had a Manufacturing systems engineer of congregation she would like chaplain to contact. Patient specified the [place]. Chaplain made referral to "[facility]" [place] congregation. Minister to visit. Please page if additional support is needed.   Ethelene Browns [phone]

## 2013-10-16 NOTE — Discharge Instructions (Signed)
Please take Doxycycline 100mg  twice a day for a total of 14 days (take till you run out of pills) I have given you a very limited refill of you pain medications, this will take you to your follow up appointment on the 15th, you must make it to this appointment. Please follow up in our clinic with Dr. Algis Liming on the 17th.

## 2013-10-16 NOTE — Telephone Encounter (Signed)
Patient was admitted into the hospital.  Morgan Gourd DO called to get the ok that the patient be given enough narcotics to get her to her appointment. Ok given.  Patient has appointment 10/21/2013.

## 2013-10-16 NOTE — Progress Notes (Signed)
Nsg Discharge Note  Admit Date:  10/15/2013 Discharge date: 10/16/2013   Morgan Wilkerson to be D/C'd Home per MD order.  AVS completed.  Copy for chart, and copy for patient signed, and dated. Patient/caregiver able to verbalize understanding.  Discharge Medication:   Medication List    STOP taking these medications       amoxicillin 500 MG capsule  Commonly known as:  AMOXIL     POTASSIUM PO      TAKE these medications       baclofen 10 MG tablet  Commonly known as:  LIORESAL  Take 1 tablet (10 mg total) by mouth 3 (three) times daily as needed (muscle spasms).     diclofenac sodium 1 % Gel  Commonly known as:  VOLTAREN  Apply 2 g topically 4 (four) times daily. To your knees     doxycycline 100 MG capsule  Commonly known as:  VIBRAMYCIN  Take 1 capsule (100 mg total) by mouth 2 (two) times daily.     HYDROcodone-acetaminophen 10-325 MG per tablet  Commonly known as:  NORCO  Take 1 tablet by mouth every 6 (six) hours as needed. For pain     methadone 10 MG tablet  Commonly known as:  DOLOPHINE  Take 2 tablets (20 mg total) by mouth every 12 (twelve) hours.     metoprolol tartrate 12.5 mg Tabs tablet  Commonly known as:  LOPRESSOR  Take 0.5 tablets (12.5 mg total) by mouth 2 (two) times daily.     venlafaxine 75 MG tablet  Commonly known as:  EFFEXOR  Take 75 mg by mouth See admin instructions. Take three tabs in the morning and two at night        Discharge Assessment: Filed Vitals:   10/16/13 1312  BP: 144/84  Pulse: 97  Temp: 99 F (37.2 C)  Resp: 20   Skin clean, dry and intact without evidence of skin break down, no evidence of skin tears noted. IV catheter discontinued intact. Site without signs and symptoms of complications - no redness or edema noted at insertion site, patient denies c/o pain - only slight tenderness at site.  Dressing with slight pressure applied.  D/c Instructions-Education: Discharge instructions given to patient/family with  verbalized understanding. D/c education completed with patient/family including follow up instructions, medication list, d/c activities limitations if indicated, with other d/c instructions as indicated by MD - patient able to verbalize understanding, all questions fully answered. Patient instructed to return to ED, call 911, or call MD for any changes in condition.  Patient escorted via [place], and D/C home via private auto.  Tod Abrahamsen, Elissa Hefty, RN 10/16/2013 7:20 PM

## 2013-10-16 NOTE — Progress Notes (Signed)
INITIAL NUTRITION ASSESSMENT  DOCUMENTATION CODES Per approved criteria  -Severe malnutrition in the context of acute illness or injury   INTERVENTION: Continue Ensure Complete po BID, each supplement provides 350 kcal and 13 grams of protein. Further diet advancement per MD discretion.  RD to continue to follow nutrition care plan.  NUTRITION DIAGNOSIS: Inadequate oral intake related to swallowing difficulty as evidenced by limited po tolerance and weight loss.   Goal: Intake to meet >90% of estimated nutrition needs.  Monitor:  weight trends, lab trends, I/O's, PO intake, supplement tolerance  Reason for Assessment: Malnutrition Screening Tool  58 y.o. female  Admitting Dx: Fever, unspecified  ASSESSMENT: PMHx significant for chronic pain syndrome, FM, anxiety. Recently diagnosed with pharyngitis. Admitted with worsening sore throat, leading to decreased intake of food and painful swallowing. Work-up reveals pharyngitis and sinusitis.  Currently ordered for Full Liquids. Pt has had 10% wt loss x 2 months. This is significant for this time frame. Pt reports that she has been very limited with foods that she was able to swallow over the past week. She has been limited to things such as milk, mashed potatoes, broth, etc. Has an Ensure at her bedside and is tolerating well. Has consumed her jello on her tray and is going to try to have the pudding soon.  Pt meets criteria for severe MALNUTRITION in the context of acute illness as evidenced by intake of <50% x at least 5 days and 10% wt loss x 2 months.  Height: Ht Readings from Last 1 Encounters:  10/15/13 5' 3.5" (1.613 m)    Weight: Wt Readings from Last 1 Encounters:  10/15/13 178 lb 12.7 oz (81.1 kg)    Ideal Body Weight: 117.5 lb  % Ideal Body Weight: 151%  Wt Readings from Last 10 Encounters:  10/15/13 178 lb 12.7 oz (81.1 kg)  10/15/13 179 lb 4.8 oz (81.33 kg)  10/04/13 187 lb 14.4 oz (85.231 kg)  08/26/13 192  lb (87.091 kg)  08/22/13 196 lb 3.2 oz (88.996 kg)  06/28/13 193 lb (87.544 kg)  06/11/13 196 lb 8 oz (89.132 kg)  05/27/13 196 lb (88.905 kg)  03/06/13 197 lb (89.359 kg)  02/15/13 200 lb (90.719 kg)    Usual Body Weight: 195 - 200 lb  % Usual Body Weight: 90%  BMI:  Body mass index is 31.17 kg/(m^2). Obese Class I  Estimated Nutritional Needs: Kcal: 1550 - 0109 Protein: 60-70 g Fluid: 1.8 - 2 liters daily  Skin: intact  Diet Order: Full Liquid  EDUCATION NEEDS: -No education needs identified at this time   Intake/Output Summary (Last 24 hours) at 10/16/13 1306 Last data filed at 10/15/13 2100  Gross per 24 hour  Intake    120 ml  Output      0 ml  Net    120 ml    Last BM: PTA  Labs:   Recent Labs Lab 10/15/13 1525  NA 137  K 4.7  CL 97  CO2 28  BUN 16  CREATININE 0.91  CALCIUM 9.9  GLUCOSE 110*    CBG (last 3)   Recent Labs  10/15/13 1524  GLUCAP 105*    Scheduled Meds: . antiseptic oral rinse  15 mL Mouth Rinse BID  . doxycycline  100 mg Oral Q12H  . enoxaparin (LOVENOX) injection  40 mg Subcutaneous Q24H  . feeding supplement (ENSURE COMPLETE)  237 mL Oral BID BM  . methadone  20 mg Oral Daily  . metoprolol tartrate  12.5 mg Oral BID  . venlafaxine  150 mg Oral QPC supper  . venlafaxine  225 mg Oral Q breakfast    Continuous Infusions:   Past Medical History  Diagnosis Date  . Essential hypertension   . Toe fracture 2001/2002    Left pinky toe  . Gastritis     Chronic, per biopsy  . Depression   . Seasonal allergic rhinitis     Spring time  . Gastroesophageal reflux disease   . Anxiety     Panic disorder  . Anemia     Anemia of chronic disease  . Fibromyalgia   . Chronic pain syndrome   . Osteopenia     DEXA 05/06/2008: L spine T -1.0, Left femur T -0.9, Right femur T -1.1  . Inappropriate sinus tachycardia   . Refusal of blood transfusions as patient is Jehovah's Witness   . Type II diabetes mellitus 6/09  .  Migraine     "q 2-3 months" (10/15/2013)  . Osteoarthritis of both knees     Tricompartmental, worse in lateral compartment  . Osteoarthritis of both shoulders     Mild  . Chronic back pain     Past Surgical History  Procedure Laterality Date  . Temporal artery biopsy / ligation Left 6/09    Negative for temperal arteritis  . Cystoscopy      With hydrolic bladder distension  . Vaginal hysterectomy  2003    with oophorectomy, for dysfunctional uterine bleeding  . Cholecystectomy      for chronic acalculus cholecystitis  . Tubal ligation      Inda Coke MS, RD, LDN Inpatient Registered Dietitian Pager: [phone] After-hours pager: [phone]

## 2013-10-16 NOTE — Care Management Note (Signed)
    Page 1 of 1   10/16/2013     3:08:14 PM CARE MANAGEMENT NOTE 10/16/2013  Patient:  Morgan Wilkerson, Morgan Wilkerson   Account Number:  [id number]  Date Initiated:  10/16/2013  Documentation initiated by:  Morgan Wilkerson  Subjective/Objective Assessment:   dx fever, leukocytosis  admit- lives with family.     Action/Plan:   Anticipated DC Date:  10/16/2013   Anticipated DC Plan:  [place]  CM consult      Choice offered to / List presented to:             Status of service:  Completed, signed off Medicare Important Message given?   (If response is "NO", the following Medicare IM given date fields will be blank) Date Medicare IM given:   Date Additional Medicare IM given:    Discharge Disposition:  HOME/SELF CARE  Per UR Regulation:  Reviewed for med. necessity/level of care/duration of stay  If discussed at [place] of Stay Meetings, dates discussed:    Comments:  10/16/13 Morgan Wilkerson, BSN [phone] Per resident, patient is for dc today, no needs anticipated.

## 2013-10-16 NOTE — H&P (Signed)
  Date: 10/16/2013  Patient name: Morgan Wilkerson  Medical record number: [id number]  Date of birth: 1955/07/28   I have seen and evaluated Morgan Wilkerson and discussed their care with the Residency Team. Morgan Wilkerson was admitted from clinic with sore throat that did not respond to outpt tx. She also had a voice change, anorexia, and odynophagia. W/U included a CT scan that showed tonsillitis & bilateral sphenoid sinusitis. She was placed on Doxy and feels much better this AM.  Of note, she is on chronic opioids - methadone and hydrocodone. She has been out pf these meds since mid may. She had typical withdrawal sxs.   On exam, vitals are stable. Throat - mild erythema no exudate, abscess. HRRR. LCTAB  Labs and imaging reviewed  Assessment and Plan: I have seen and evaluated the patient as outlined above. I agree with the formulated Assessment and Plan as detailed in the residents' admission note, with the following changes:   1. Pharyngitis and sinusitis - agree with Doxy 100 BID to treat both. She is stable for D/C home.  2. Opioid withdrawal - Dr Heber Chester spoke to her pain med MD and got her a F/U appt and approval to write limited script at D/C.   Bartholomew Crews, MD 6/10/20153:22 PM

## 2013-10-16 NOTE — Discharge Summary (Signed)
Name: Morgan Wilkerson MRN: [id number] DOB: [birth year] 58 y.o. PCP: Karren Cobble, MD  Date of Admission: 10/15/2013  5:15 PM Date of Discharge: 10/16/2013 Attending Physician: Bartholomew Crews, MD  Discharge Diagnosis:   Pharyngitis   Type II diabetes mellitus   Essential hypertension   Chronic pain syndrome   Anemia of chronic disease     Discharge Medications:   Medication List    STOP taking these medications       amoxicillin 500 MG capsule  Commonly known as:  AMOXIL     POTASSIUM PO      TAKE these medications       baclofen 10 MG tablet  Commonly known as:  LIORESAL  Take 1 tablet (10 mg total) by mouth 3 (three) times daily as needed (muscle spasms).     diclofenac sodium 1 % Gel  Commonly known as:  VOLTAREN  Apply 2 g topically 4 (four) times daily. To your knees     doxycycline 100 MG capsule  Commonly known as:  VIBRAMYCIN  Take 1 capsule (100 mg total) by mouth 2 (two) times daily.     HYDROcodone-acetaminophen 10-325 MG per tablet  Commonly known as:  NORCO  Take 1 tablet by mouth every 6 (six) hours as needed. For pain     methadone 10 MG tablet  Commonly known as:  DOLOPHINE  Take 2 tablets (20 mg total) by mouth every 12 (twelve) hours.     metoprolol tartrate 12.5 mg Tabs tablet  Commonly known as:  LOPRESSOR  Take 0.5 tablets (12.5 mg total) by mouth 2 (two) times daily.     venlafaxine 75 MG tablet  Commonly known as:  EFFEXOR  Take 75 mg by mouth See admin instructions. Take three tabs in the morning and two at night        Disposition and follow-up:   Morgan Wilkerson was discharged from [facility] in Stable condition.  At the hospital follow up visit please address:  1.  Completion of doxycycline (end date 6/23), resolution of symptoms of pharyngitis/sinusitis.  2.  Labs / imaging needed at time of follow-up: none  3.  Pending labs/ test needing follow-up: Blood cultures 10/15/13  Follow-up  Appointments: Follow-up Information   Follow up with Danella Sensing, NP On 10/21/2013. (at 9:15 am for pain medication refills)    Specialty:  Physical Medicine and Rehabilitation   Contact information:   [place] [place] [address] [phone]       Follow up with Clinton Gallant, MD On 10/23/2013. (at 8:45 am for hospital follow up)    Specialty:  Internal Medicine   Contact information:   [place] [address] [phone]       Discharge Instructions: Discharge Instructions   Call MD for:  temperature >100.4    Complete by:  As directed      Diet - low sodium heart healthy    Complete by:  As directed      Discharge instructions    Complete by:  As directed   Please take you Doxycycline twice a day until you have finished all pills. Make sure to keep your follow up with the pain clinic on Monday.     Increase activity slowly    Complete by:  As directed            Consultations:    Procedures Performed:  Dg Chest 2 View  10/15/2013  CLINICAL DATA:  Leukocytosis.  EXAM: CHEST  2 VIEW  COMPARISON:  05/13/2011  FINDINGS: Normal heart size and stable aortic tortuosity. There is mild hypoaeration with interstitial coarsening. No definitive consolidation. No edema, effusion, or pneumothorax. Mild thoracolumbar dextroscoliosis.  IMPRESSION: No definitive pneumonia.   Electronically Signed   By: Jorje Guild M.D.   On: 10/15/2013 22:03   Ct Soft Tissue Neck W Contrast  10/15/2013   CLINICAL DATA:  Sore throat.  Rule out abscess.  EXAM: CT NECK WITH CONTRAST  TECHNIQUE: Multidetector CT imaging of the neck was performed using the standard protocol following the bolus administration of intravenous contrast.  CONTRAST:  66mL OMNIPAQUE IOHEXOL 300 MG/ML  SOLN  COMPARISON:  None.  FINDINGS: There is symmetric enlargement and enhancement of tonsillar tissue, especially the adenoid tonsils. The adenoid tonsils were also enlarged on brain MR imaging 09/21/2007.  There is bilateral sphenoid sinus obstruction with effusions and mucosal thickening. There is no abscess or retropharyngeal edema. Mild enlargement of bilateral cervical lymph nodes which is considered reactive. There is no venous compromise in the neck. Clear apical lungs. No acute osseous findings. Dental caries with periapical erosion noted around the left lower second premolar.  Negative salivary and thyroid glands.  No osseous erosion.  IMPRESSION: 1. Tonsillitis, superimposed on chronic enlargement of the adenoid tonsils. No abscess or retropharyngeal edema. 2. Bilateral sphenoid sinusitis.   Electronically Signed   By: Jorje Guild M.D.   On: 10/15/2013 21:59   Admission HPI: Morgan Wilkerson is a 58 y.o. woman with a pmhx of chronic pain syndrome, FM, Anxiety, who presented to [facility] earlier today. She was recently diagnosed with pharyngitis and was given a 10 day course of amoxicillin. She had a partial response to this treatment. She has continued sore throat. She notes a change in voice and pain with opening her mouth. She has been able to swallow fluids and pills, but admits to decreased food intake due to anorexia and pain with swallowing.  Of note, the patient describes running out out of pain meds due to losing her refill prescription. She contacted her pain management physician Dr. Naaman Plummer on 6/4 for worsening pain and no pain meds. She describes chills, malaise, fatigue, and headache since running out of her pains meds. It is a little unclear as to what meds the patient takes at home for her chronic pain. Per Dr. Charm Barges note, the patient is given 10 mg methadone BID and norco QID. Per the patient she takes 1 methodone pill a day and 2 narco's a day. The headache is located across the fronr of her forehead.  She denies chest pain, cough, abdominal pain, constipation or diarrhea. Denies any wounds.  Denies drug use, alcohol use, cig use.  Of note, the patient complains of hallucinations and  confusion while taking amoxicillin, which has now resolved.   Hospital Course by problem list: Pharyngitis and Sinusitis  Patient presented with sore throat, fever, cervical LAD, was diagnosed with pharyngitis and failed to respond to 10 day course of Amoxicillin.  CT head and neck confirmed diagnosis and showed no evidence of abscess.  Blood cultures were obtained that had had NGTD.  We changed antibiotic class to Doxycycline overnight her symptoms improved, fever resolved, and leukocytosis significantly trended down.  A monospot test was also obtained and negative for EBV, an HIV screen was also negative.  She was discharged with follow up at the [facility] in 1-2 weeks.  She will continue Doxycycline to complete  14 days.  She was given return precautions.  Chronic pain syndrome  - Patient ran out of medication, has pain contract with Dr. Tessa Lerner. Made follow up appointment for 6/15.  Spoke with Dr. Candise Che nurse Ivin Booty.  Patient Given limited refill of Hydrocodone and Methadone to get her to that appointment.  Type II diabetes mellitus  - Well controlled without medication   Essential hypertension  - Continued home metoprolol     Discharge Vitals:   BP 144/87  Pulse 89  Temp(Src) 99.8 F (37.7 C) (Oral)  Resp 18  Ht 5' 3.5" (1.613 m)  Wt 178 lb 12.7 oz (81.1 kg)  BMI 31.17 kg/m2  SpO2 98%  LMP 03/08/2002  Discharge Labs:  Results for orders placed during the hospital encounter of 10/15/13 (from the past 24 hour(s))  MONONUCLEOSIS SCREEN     Status: None   Collection Time    10/15/13 10:30 PM      Result Value Ref Range   Mono Screen NEGATIVE  NEGATIVE  CBC     Status: Abnormal   Collection Time    10/16/13  5:47 AM      Result Value Ref Range   WBC 12.7 (*) 4.0 - 10.5 K/uL   RBC 4.04  3.87 - 5.11 MIL/uL   Hemoglobin 11.4 (*) 12.0 - 15.0 g/dL   HCT 35.0 (*) 36.0 - 46.0 %   MCV 86.6  78.0 - 100.0 fL   MCH 28.2  26.0 - 34.0 pg   MCHC 32.6  30.0 - 36.0 g/dL   RDW 14.2   11.5 - 15.5 %   Platelets 261  150 - 400 K/uL  HEPATIC FUNCTION PANEL     Status: Abnormal   Collection Time    10/16/13  5:47 AM      Result Value Ref Range   Total Protein 8.6 (*) 6.0 - 8.3 g/dL   Albumin 3.4 (*) 3.5 - 5.2 g/dL   AST 22  0 - 37 U/L   ALT 21  0 - 35 U/L   Alkaline Phosphatase 95  39 - 117 U/L   Total Bilirubin 0.4  0.3 - 1.2 mg/dL   Bilirubin, Direct <0.2  0.0 - 0.3 mg/dL   Indirect Bilirubin NOT CALCULATED  0.3 - 0.9 mg/dL    Signed: Joni Reining, DO 10/16/2013, 11:39 AM   Time Spent on Discharge: 35 minutes Services Ordered on Discharge: none Equipment Ordered on Discharge: none

## 2013-10-16 NOTE — Progress Notes (Signed)
Subjective: Patient reports moderate HA this morning, she reports its no different than normal at home, requesting her home pain medication.  She reports her throat is feeling better this morning and feels like the swelling has decreased. In addition she reports she did complete her full course of amoxicillin at home. Tolerating her diet and medications. Objective: Vital signs in last 24 hours: Filed Vitals:   10/15/13 1800 10/15/13 2015 10/15/13 2215 10/16/13 0619  BP: 126/83  133/81 144/87  Pulse: 100  101 89  Temp: 99.5 F (37.5 C)  99.1 F (37.3 C) 99.8 F (37.7 C)  TempSrc: Oral  Oral Oral  Resp: 18  20 18   Height:  5' 3.5" (1.613 m)    Weight:  178 lb 12.7 oz (81.1 kg)    SpO2: 100%  95% 98%   Weight change:   Intake/Output Summary (Last 24 hours) at 10/16/13 1037 Last data filed at 10/15/13 2100  Gross per 24 hour  Intake    120 ml  Output      0 ml  Net    120 ml   General: resting in bed HEENT: mildly tender cervical LAD R>L posterior pharyngeal swelling bilaterally, no uvula shift. Cardiac: RRR, no murmurs  Pulm: CTAB Abd: soft, nontender, nondistended, BS present Ext: warm and well perfused, no pedal edema  Lab Results: Basic Metabolic Panel:  Recent Labs Lab 10/15/13 1525  NA 137  K 4.7  CL 97  CO2 28  GLUCOSE 110*  BUN 16  CREATININE 0.91  CALCIUM 9.9   Liver Function Tests:  Recent Labs Lab 10/16/13 0547  AST 22  ALT 21  ALKPHOS 95  BILITOT 0.4  PROT 8.6*  ALBUMIN 3.4*   No results found for this basename: LIPASE, AMYLASE,  in the last 168 hours No results found for this basename: AMMONIA,  in the last 168 hours CBC:  Recent Labs Lab 10/15/13 1525 10/16/13 0547  WBC 20.2* 12.7*  NEUTROABS 16.8*  --   HGB 11.7* 11.4*  HCT 35.6* 35.0*  MCV 86.8 86.6  PLT 278 261   Cardiac Enzymes: No results found for this basename: CKTOTAL, CKMB, CKMBINDEX, TROPONINI,  in the last 168 hours BNP: No results found for this basename:  PROBNP,  in the last 168 hours D-Dimer: No results found for this basename: DDIMER,  in the last 168 hours CBG:  Recent Labs Lab 10/15/13 1524  GLUCAP 105*   Hemoglobin A1C:  Recent Labs Lab 10/15/13 1533  HGBA1C 5.5   Fasting Lipid Panel: No results found for this basename: CHOL, HDL, LDLCALC, TRIG, CHOLHDL, LDLDIRECT,  in the last 168 hours Thyroid Function Tests: No results found for this basename: TSH, T4TOTAL, FREET4, T3FREE, THYROIDAB,  in the last 168 hours Coagulation: No results found for this basename: LABPROT, INR,  in the last 168 hours Anemia Panel: No results found for this basename: VITAMINB12, FOLATE, FERRITIN, TIBC, IRON, RETICCTPCT,  in the last 168 hours Urine Drug Screen: Drugs of Abuse     Component Value Date/Time   LABOPIA POSITIVE* 01/26/2009 2134   [place] 01/26/2009 2134   LABBENZ NONE DETECTED 01/26/2009 2134   AMPHETMU NONE DETECTED 01/26/2009 2134   [place] DETECTED 01/26/2009 2134   LABBARB  Value: NONE DETECTED        DRUG SCREEN FOR MEDICAL PURPOSES ONLY.  IF CONFIRMATION IS NEEDED FOR ANY PURPOSE, NOTIFY LAB WITHIN 5 DAYS.        LOWEST DETECTABLE LIMITS FOR  URINE DRUG SCREEN Drug Class       Cutoff (ng/mL) Amphetamine      1000 Barbiturate      200 Benzodiazepine   409 Tricyclics       811 Opiates          300 Cocaine          300 THC              50 01/26/2009 2134    Alcohol Level: No results found for this basename: ETH,  in the last 168 hours Urinalysis:  Recent Labs Lab 10/15/13 1624 10/15/13 1629  COLORURINE YELLOW  --   LABSPEC 1.015  --   PHURINE 7.0  --   GLUCOSEU NEG  --   HGBUR SMALL*  --   BILIRUBINUR NEG Negative  KETONESUR NEG  --   PROTEINUR NEG 30 mg  UROBILINOGEN 0.2 0.2  NITRITE NEG Negative  LEUKOCYTESUR SMALL* large (3+)   Micro Results: Recent Results (from the past 240 hour(s))  CULTURE, BLOOD (SINGLE)     Status: None   Collection Time    10/15/13  4:23 PM      Result Value Ref Range  Status   Preliminary Report Blood Culture received; No Growth to date;   Preliminary   Preliminary Report Culture will be held for 5 days before issuing   Preliminary   Preliminary Report a Final Negative report.   Preliminary  CULTURE, BLOOD (SINGLE)     Status: None   Collection Time    10/15/13  4:30 PM      Result Value Ref Range Status   Preliminary Report Blood Culture received; No Growth to date;   Preliminary   Preliminary Report Culture will be held for 5 days before issuing   Preliminary   Preliminary Report a Final Negative report.   Preliminary   Studies/Results: Dg Chest 2 View  10/15/2013   CLINICAL DATA:  Leukocytosis.  EXAM: CHEST  2 VIEW  COMPARISON:  05/13/2011  FINDINGS: Normal heart size and stable aortic tortuosity. There is mild hypoaeration with interstitial coarsening. No definitive consolidation. No edema, effusion, or pneumothorax. Mild thoracolumbar dextroscoliosis.  IMPRESSION: No definitive pneumonia.   Electronically Signed   By: Jorje Guild M.D.   On: 10/15/2013 22:03   Ct Soft Tissue Neck W Contrast  10/15/2013   CLINICAL DATA:  Sore throat.  Rule out abscess.  EXAM: CT NECK WITH CONTRAST  TECHNIQUE: Multidetector CT imaging of the neck was performed using the standard protocol following the bolus administration of intravenous contrast.  CONTRAST:  47mL OMNIPAQUE IOHEXOL 300 MG/ML  SOLN  COMPARISON:  None.  FINDINGS: There is symmetric enlargement and enhancement of tonsillar tissue, especially the adenoid tonsils. The adenoid tonsils were also enlarged on brain MR imaging 09/21/2007. There is bilateral sphenoid sinus obstruction with effusions and mucosal thickening. There is no abscess or retropharyngeal edema. Mild enlargement of bilateral cervical lymph nodes which is considered reactive. There is no venous compromise in the neck. Clear apical lungs. No acute osseous findings. Dental caries with periapical erosion noted around the left lower second premolar.   Negative salivary and thyroid glands.  No osseous erosion.  IMPRESSION: 1. Tonsillitis, superimposed on chronic enlargement of the adenoid tonsils. No abscess or retropharyngeal edema. 2. Bilateral sphenoid sinusitis.   Electronically Signed   By: Jorje Guild M.D.   On: 10/15/2013 21:59   Medications: I have reviewed the patient's current medications. Scheduled Meds: . antiseptic oral rinse  15 mL Mouth Rinse BID  . doxycycline  100 mg Oral Q12H  . enoxaparin (LOVENOX) injection  40 mg Subcutaneous Q24H  . feeding supplement (ENSURE COMPLETE)  237 mL Oral BID BM  . methadone  20 mg Oral Daily  . metoprolol tartrate  12.5 mg Oral BID  . venlafaxine  150 mg Oral QPC supper  . venlafaxine  225 mg Oral Q breakfast   Continuous Infusions:  PRN Meds:.acetaminophen, acetaminophen, HYDROcodone-acetaminophen Assessment/Plan:   Bacterial Pharyngitis - Patient failed outpatient treatment with 10 day course of Amoxicillin.  Has had dramatic improvement with symptoms overnight with change to doxycycline.  Her leukocytosis has almost resolved and she is now afebrile with stable vital signs. - She is stable for discharge home to complete a 14 day course of doxycycline.    Type II diabetes mellitus - Well controlled without medication    Essential hypertension - Continue Metoprolol    Chronic pain syndrome  - Will call Dr. Dava Najjar office to make follow up appointment and will likely provide enough medication to make it just to that appointment. - Update: Spoke with Nurse Sharron at Dr. Dava Najjar office. Earliest appointment available is Monday 6/15.  OK to Rx limited refill until that time of Norco, Methadone.  Rx given for Norco 10/325 #18, Methadone 10mg  #20.   Dispo: Discharge home today  The patient does have a current PCP Karren Cobble, MD) and does need an [facility] hospital follow-up appointment after discharge.  The patient does not have transportation limitations that hinder  transportation to clinic appointments.  .Services Needed at time of discharge: Y = Yes, Blank = No PT:   OT:   RN:   Equipment:   Other:     LOS: 1 day   Joni Reining, DO 10/16/2013, 10:37 AM

## 2013-10-19 LAB — URINE CULTURE: Colony Count: 15000

## 2013-10-21 ENCOUNTER — Encounter: Payer: Medicare Other | Admitting: Registered Nurse

## 2013-10-21 LAB — CULTURE, BLOOD (SINGLE)
ORGANISM ID, BACTERIA: NO GROWTH
Organism ID, Bacteria: NO GROWTH

## 2013-10-22 ENCOUNTER — Encounter: Payer: Self-pay | Admitting: Registered Nurse

## 2013-10-22 ENCOUNTER — Encounter: Payer: Medicare Other | Attending: Physical Medicine & Rehabilitation | Admitting: Registered Nurse

## 2013-10-22 VITALS — BP 146/80 | HR 93 | Resp 16 | Ht 63.0 in | Wt 192.0 lb

## 2013-10-22 DIAGNOSIS — R9431 Abnormal electrocardiogram [ECG] [EKG]: Secondary | ICD-10-CM | POA: Insufficient documentation

## 2013-10-22 DIAGNOSIS — Z5181 Encounter for therapeutic drug level monitoring: Secondary | ICD-10-CM

## 2013-10-22 DIAGNOSIS — F3289 Other specified depressive episodes: Secondary | ICD-10-CM

## 2013-10-22 DIAGNOSIS — M797 Fibromyalgia: Secondary | ICD-10-CM

## 2013-10-22 DIAGNOSIS — Z79899 Other long term (current) drug therapy: Secondary | ICD-10-CM

## 2013-10-22 DIAGNOSIS — M19019 Primary osteoarthritis, unspecified shoulder: Secondary | ICD-10-CM | POA: Insufficient documentation

## 2013-10-22 DIAGNOSIS — IMO0001 Reserved for inherently not codable concepts without codable children: Secondary | ICD-10-CM | POA: Insufficient documentation

## 2013-10-22 DIAGNOSIS — IMO0002 Reserved for concepts with insufficient information to code with codable children: Secondary | ICD-10-CM | POA: Insufficient documentation

## 2013-10-22 DIAGNOSIS — M19011 Primary osteoarthritis, right shoulder: Secondary | ICD-10-CM

## 2013-10-22 DIAGNOSIS — M1710 Unilateral primary osteoarthritis, unspecified knee: Secondary | ICD-10-CM | POA: Insufficient documentation

## 2013-10-22 DIAGNOSIS — M170 Bilateral primary osteoarthritis of knee: Secondary | ICD-10-CM

## 2013-10-22 DIAGNOSIS — G894 Chronic pain syndrome: Secondary | ICD-10-CM | POA: Insufficient documentation

## 2013-10-22 DIAGNOSIS — F329 Major depressive disorder, single episode, unspecified: Secondary | ICD-10-CM

## 2013-10-22 DIAGNOSIS — F32A Depression, unspecified: Secondary | ICD-10-CM

## 2013-10-22 DIAGNOSIS — M19012 Primary osteoarthritis, left shoulder: Secondary | ICD-10-CM

## 2013-10-22 MED ORDER — METHADONE HCL 10 MG PO TABS: 20.0000 mg | ORAL_TABLET | Freq: Two times a day (BID) | ORAL | Status: DC

## 2013-10-22 MED ORDER — HYDROCODONE-ACETAMINOPHEN 10-325 MG PO TABS: 1.0000 | ORAL_TABLET | Freq: Four times a day (QID) | ORAL | Status: DC | PRN

## 2013-10-22 NOTE — Progress Notes (Signed)
Subjective:    Patient ID: Morgan Wilkerson, female    DOB: 20-Oct-1955, 58 y.o.   MRN: [id number]  HPI: Morgan Wilkerson is a 58 year old female who returns for follow up for chronic pain and medication refill. She says her pain is located in her head, lower back and bilateral legs. She rates her pain 9. Her current exercise regime is stretching exercises.  She says she had a virus for over three weeks, her PMD prescribed her antibiotics. She was admitted to [facility] on 10/15/13- 10/16/13 with Bacterial Pharyngitis: Bacterial Pharyngitis  - Patient failed outpatient treatment with 10 day course of Amoxicillin. Has had dramatic improvement with symptoms overnight with change to doxycycline. Her leukocytosis has almost resolved and she is now afebrile with stable vital signs.  - She is stable for discharge home to complete a 14 day course of doxycycline.     Pain Inventory Average Pain 7 Pain Right Now 9 My pain is intermittent, constant, sharp, stabbing and aching  In the last 24 hours, has pain interfered with the following? General activity 10 Relation with others 10 Enjoyment of life 10 What TIME of day is your pain at its worst? night Sleep (in general) Fair  Pain is worse with: walking, bending, sitting, inactivity and standing Pain improves with: medication Relief from Meds: 6  Mobility walk with assistance how many minutes can you walk? 10-15 ability to climb steps?  no do you drive?  no Do you have any goals in this area?  yes  Function disabled: date disabled . I need assistance with the following:  dressing, bathing, meal prep, household duties and shopping Do you have any goals in this area?  yes  Neuro/Psych weakness numbness tremor tingling trouble walking spasms dizziness confusion depression anxiety  Prior Studies x-rays  Physicians involved in your care Any changes since last visit?  no   Family History  Problem Relation Age of Onset   . Hypertension Mother   . Cerebral aneurysm Maternal Aunt   . Early death Father 28    Raod accident  . Healthy Sister   . Healthy Brother   . Healthy Daughter   . Cerebral aneurysm Maternal Grandfather   . Cerebral aneurysm Maternal Aunt   . Healthy Sister   . Healthy Sister   . Healthy Sister   . Healthy Sister   . Healthy Brother   . Healthy Brother   . Healthy Grandchild    History   Social History  . Marital Status: Married    Spouse Name: N/A    Number of Children: N/A  . Years of Education: 14   Occupational History  .     Social History Main Topics  . Smoking status: Never Smoker   . Smokeless tobacco: Never Used  . Alcohol Use: No     Comment: Rarely.   . Drug Use: No  . Sexual Activity: Not Currently   Other Topics Concern  . None   Social History Narrative   Patients phone number is [phone]   Past Surgical History  Procedure Laterality Date  . Temporal artery biopsy / ligation Left 6/09    Negative for temperal arteritis  . Cystoscopy      With hydrolic bladder distension  . Vaginal hysterectomy  2003    with oophorectomy, for dysfunctional uterine bleeding  . Cholecystectomy      for chronic acalculus cholecystitis  . Tubal ligation     Past  Medical History  Diagnosis Date  . Essential hypertension   . Toe fracture 2001/2002    Left pinky toe  . Gastritis     Chronic, per biopsy  . Depression   . Seasonal allergic rhinitis     Spring time  . Gastroesophageal reflux disease   . Anxiety     Panic disorder  . Anemia     Anemia of chronic disease  . Fibromyalgia   . Chronic pain syndrome   . Osteopenia     DEXA 05/06/2008: L spine T -1.0, Left femur T -0.9, Right femur T -1.1  . Inappropriate sinus tachycardia   . Refusal of blood transfusions as patient is Jehovah's Witness   . Type II diabetes mellitus 6/09  . Migraine     "q 2-3 months" (10/15/2013)  . Osteoarthritis of both knees     Tricompartmental, worse in lateral  compartment  . Osteoarthritis of both shoulders     Mild  . Chronic back pain    BP 146/80  Pulse 93  Resp 16  Ht 5\' 3"  (1.6 m)  Wt 192 lb (87.091 kg)  BMI 34.02 kg/m2  SpO2 99%  LMP 03/08/2002  Opioid Risk Score:   Fall Risk Score: Moderate Fall Risk (6-13 points) (patient educated handout declined)   Review of Systems  Musculoskeletal: Positive for gait problem.  Neurological: Positive for dizziness, tremors, weakness and numbness.  Psychiatric/Behavioral: Positive for confusion and dysphoric mood. The patient is nervous/anxious.   All other systems reviewed and are negative.      Objective:   Physical Exam  Nursing note and vitals reviewed. Constitutional: She is oriented to person, place, and time. She appears well-developed and well-nourished.  HENT:  Head: Normocephalic and atraumatic.  Neck: Normal range of motion. Neck supple.  Cardiovascular: Normal rate, regular rhythm and normal heart sounds.   Pulmonary/Chest: Effort normal and breath sounds normal.  Musculoskeletal:  Normal Muscle Bulk and Muscle Testing Reveals: Upper Extremities: Full ROM and Muscle Strength Right 4/5 and Left 5/5 Spine Forward Flexion 40 Degrees Lower Extremities Flexion Produces Pain into Groin and Patella. No Tenderness with Palpation. Arises From Chair with ease. Narrow Based Gait   Neurological: She is alert and oriented to person, place, and time.  Skin: Skin is warm and dry.  Psychiatric: She has a normal mood and affect.          Assessment & Plan:  1. Fibromyalgia syndrome: Continue with exercise, heat and ice therapy. 2. 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 3. Myofascial shoulder pain: Continue Current Medication Regime. 4. Depression: On Effexor. Continue to monitor. 6. History of insomnia. No Continue to monitor. 7.  Osteoarthritis of the knees: Continue Current Medication Regime.  20 minutes of face to face patient care time was spent during this visit. All questions were encouraged and answered.  F/U in 2 months

## 2013-10-23 ENCOUNTER — Telehealth: Payer: Self-pay | Admitting: Registered Nurse

## 2013-10-23 ENCOUNTER — Encounter: Payer: Self-pay | Admitting: Internal Medicine

## 2013-10-23 ENCOUNTER — Ambulatory Visit: Payer: Medicare Other | Admitting: Internal Medicine

## 2013-10-23 NOTE — Telephone Encounter (Signed)
Ms. Starn husband brought up script to the office it wasn't signed by this Probation officer. I checked [employer]. No script has been filled since her discharge from [facility] to Ms. Fraser she has the second script and it was dated not to be filled until 11/19/13. Script signed and given to her husband.

## 2013-10-24 ENCOUNTER — Telehealth: Payer: Self-pay

## 2013-10-24 NOTE — Telephone Encounter (Signed)
Pharmacy returned call and stated that they figured out what happen. Pharmacist asked if we could inform the patient that they may take the script back to [place] to be filled. Patient needs to ask for Elta Guadeloupe or Barbaraann Rondo. Attempted to contact patient no answer nor voicemail.

## 2013-10-24 NOTE — Telephone Encounter (Signed)
Patient states that the pharmacy would not fill her Hydrocodone RX and that they had wrote on the RX. Contacted the pharmacy to see what was going on. Pharmacist states that patient brought medication in yesterday and insurance denied paying. He could not tell me why or anything about the rejection. Pharmacist will contact insurance company and call us back. Attempted to contact pt there was no answer nor voicemail.

## 2013-11-27 ENCOUNTER — Telehealth: Payer: Self-pay | Admitting: *Deleted

## 2013-11-27 ENCOUNTER — Other Ambulatory Visit: Payer: Self-pay | Admitting: Internal Medicine

## 2013-11-27 NOTE — Telephone Encounter (Signed)
Pt calls for refill of effexor, tried to rtc, no answer, no vmail, will need appt

## 2013-11-29 ENCOUNTER — Other Ambulatory Visit: Payer: Self-pay | Admitting: Internal Medicine

## 2013-11-29 DIAGNOSIS — M797 Fibromyalgia: Secondary | ICD-10-CM

## 2013-12-02 NOTE — Telephone Encounter (Signed)
Pt called today asking about her Effexor refill request fr.om July 22. She states that [facility] told her they had not heard anything and she had not received call from [facility]. RN note in chart states call placed to pt on 7/22/to let her know she needed to be seen for a refill. Refill request passed to triage nurse to determine next available appt and Effexor supply until then. Yvonna Alanis, RN, 12/02/13, 3:59 PM

## 2013-12-03 ENCOUNTER — Encounter: Payer: Self-pay | Admitting: Internal Medicine

## 2013-12-03 ENCOUNTER — Ambulatory Visit (INDEPENDENT_AMBULATORY_CARE_PROVIDER_SITE_OTHER): Payer: Medicare Other | Admitting: Internal Medicine

## 2013-12-03 ENCOUNTER — Telehealth: Payer: Self-pay | Admitting: *Deleted

## 2013-12-03 VITALS — BP 161/87 | HR 90 | Temp 97.0°F | Wt 189.5 lb

## 2013-12-03 DIAGNOSIS — J309 Allergic rhinitis, unspecified: Secondary | ICD-10-CM

## 2013-12-03 DIAGNOSIS — M797 Fibromyalgia: Secondary | ICD-10-CM

## 2013-12-03 DIAGNOSIS — J302 Other seasonal allergic rhinitis: Secondary | ICD-10-CM

## 2013-12-03 DIAGNOSIS — Z0000 Encounter for general adult medical examination without abnormal findings: Secondary | ICD-10-CM

## 2013-12-03 DIAGNOSIS — E119 Type 2 diabetes mellitus without complications: Secondary | ICD-10-CM

## 2013-12-03 DIAGNOSIS — IMO0001 Reserved for inherently not codable concepts without codable children: Secondary | ICD-10-CM

## 2013-12-03 DIAGNOSIS — I10 Essential (primary) hypertension: Secondary | ICD-10-CM

## 2013-12-03 DIAGNOSIS — F410 Panic disorder [episodic paroxysmal anxiety] without agoraphobia: Secondary | ICD-10-CM

## 2013-12-03 LAB — GLUCOSE, CAPILLARY
GLUCOSE-CAPILLARY: 88 mg/dL (ref 70–99)
Glucose-Capillary: 89 mg/dL (ref 70–99)

## 2013-12-03 LAB — LIPID PANEL
CHOL/HDL RATIO: 4.1 ratio
Cholesterol: 135 mg/dL (ref 0–200)
HDL: 33 mg/dL — AB (ref >39)
LDL CALC: 50 mg/dL (ref 0–99)
Triglycerides: 259 mg/dL — ABNORMAL HIGH (ref <150)
VLDL: 52 mg/dL — ABNORMAL HIGH (ref 0–40)

## 2013-12-03 MED ORDER — VENLAFAXINE HCL 75 MG PO TABS: ORAL_TABLET | ORAL | Status: DC

## 2013-12-03 MED ORDER — ACCU-CHEK FASTCLIX LANCETS MISC: 1.0000 | Freq: Every day | Status: AC

## 2013-12-03 MED ORDER — GLUCOSE BLOOD VI STRP: ORAL_STRIP | Status: AC

## 2013-12-03 MED ORDER — ACCU-CHEK NANO SMARTVIEW W/DEVICE KIT: 1.0000 | PACK | Freq: Every day | Status: DC

## 2013-12-03 NOTE — Patient Instructions (Signed)
Thank you for coming to clinic today [name].  General instructions: -I refilled you Effexor.  Pick this up at the pharmacy and continue to take 5 pills per day (3 in the morning, 2 at night) for your anxiety. -Your blood pressure was high today.  In order for your medication to be effective, you need to take metoprolol 12.5 mg twice every day. -I ordered you a blood sugar monitor and testing supplies.  Pick this up and check your blood sugar every day, so we can determine if your blood sugar is getting to low or too high. -We will check some labs today to determine if you need to be on any additional medications. -We will schedule you a colonoscopy and let you know when and where to get that done. -Please make a follow up appointment to return to clinic in 6 months.  Please bring your medicines with you each time you come.   Medicines may be  Eye drops  Herbal   Vitamins  Pills  Seeing these help [place] take care of you.

## 2013-12-03 NOTE — Telephone Encounter (Signed)
Pt contacted. This RN let pt know she has no voice mail and clinic has been trying to contact her since 11/27/13 to let her know that Dr. Eppie Gibson wanted her to come in for appt since she has missed 2 OV, which includes a f/u from a hospital discharge. appt made by triage RN yesterday for 12/05/2013 to give [place] time to try to contact her since her phone takes no messages but pt prefers to come in today since her Effexor is BID and she only has 4 pills left. Appt changed at her request to 2:15 this afternoon, 12/03/13, with Dr. Trudee Kuster. Pt states she will get her daughter to bring her today at 2:15 PM. Yvonna Alanis, RN, 12/03/13, 7:38 AM

## 2013-12-03 NOTE — Assessment & Plan Note (Signed)
She does not remember when she had a colonoscopy, and there are no records in the system. -Colonoscopy referral to GI.

## 2013-12-03 NOTE — Assessment & Plan Note (Addendum)
Lab Results  Component Value Date   HGBA1C 5.5 10/15/2013   HGBA1C 5.9 08/22/2013   HGBA1C 6.0 01/14/2013     Assessment: Diabetes control: good control (HgbA1C at goal) Progress toward A1C goal:  at goal Comments: Diabetes developed in the past when on prednisone.  It has resolved after stopping prednisone and is currently controlled by diet.  She does report light headedness in clinic today, which she says happens a couple of times per week and has been occuring for more than 8 years.  She does not have a functional glucometer currently, so it is unclear if these episodes are related to her blood sugar or potentially her anxiety.  Plan: Medications:  Controlled with diet. Home glucose monitoring: Frequency: once a day Timing: before breakfast Instruction/counseling given: reminded to bring blood glucose meter & log to each visit, reminded to bring medications to each visit, discussed the need for weight loss and discussed diet Other plans: Wrote a prescription for new glucometer and diabetes testing supplies.  She will monitor her sugar once a day and continue a healthy diet and exercise as tolerated.  Ordered lipid panel and microalbumin today.

## 2013-12-03 NOTE — Telephone Encounter (Signed)
Has appt 12/03/13 2:15PM Dr Trudee Kuster. Pt aware.

## 2013-12-03 NOTE — Assessment & Plan Note (Signed)
BP Readings from Last 3 Encounters:  12/03/13 161/87  10/22/13 146/80  10/16/13 144/84    Lab Results  Component Value Date   NA 137 10/15/2013   K 4.7 10/15/2013   CREATININE 0.91 10/15/2013    Assessment: Blood pressure control: mildly elevated Progress toward BP goal:  deteriorated Comments: Has been taking metoprolol 2-3 times per week instead of BID.  Plan: Medications:  continue current medications: metoprolol 12.5 mg BID. Other plans: Discussed the importance of taking her metoprolol every day to maintain a normal blood pressure.  She understands this and has agreed to try taking her blood pressure medication every day.

## 2013-12-03 NOTE — Assessment & Plan Note (Signed)
Patient's pharyngitis has resolved since hospitalization, and she does not currently have signs of infection.  Her chronic sore throat is likely related to allergies and post-nasal drip.  She has taken Claritin in the past, but she would rather continue throat lozenges and Benadryl for now to help with her sore throats. -Throat lozenges and Benadryl PRN.

## 2013-12-03 NOTE — Assessment & Plan Note (Signed)
She says that her symptoms have improved significantly on venlafaxine, and she has noticed more anxiety as she has run low on the medication. -Refilled Effexor 75 mg 3 tabs in the morning and 2 tabs at night.

## 2013-12-03 NOTE — Progress Notes (Signed)
Subjective:    Patient ID: Morgan Wilkerson, female    DOB: [birth year], 58 y.o.   MRN: [id number]  HPI Comments: Morgan Wilkerson is a 57 year old woman with a history of HTN, GERD, DMII, depression, chronic pain, and anxiety who presents to clinic for a follow-up visit.  She was discharged from the hospital on 10/16/13 after admission for bacterial pharyngitis that failed outpatient treatment due to noncompliance.  Since then, she has missed two follow up appointments.  She rescheduled today because she is running out of Effexor.  She says that her sore throat has improved, and she is not having trouble swallowing currently.  She reports completing her course of doxycycline after being discharged from the hospital.  She thinks that the doxycycline turned her teeth brown.  She does occasionally still have a sore throat that responds to lozenges and Benadryl.  It happens with changes in the weather approximately 3 times per month and last occurred last week.  She is not currently having fevers or chills.  She has a Wilkerson headache and feels a little dizzy, but she thinks this is because her blood sugar is a little low (measured at 89 in clinic today).  She has not been measuring her blood sugars at home because she has not been taking any medications for her diabetes.  She says that her meter stopped working, and she needs a prescription for another meter.  She has been controlling her diabetes with diet, eating lots of vegetables and fruit.  She follows with the pain clinic for her chronic pain and fibromyalgia, and she has been having problems with her back and legs.  She has been trying to exercise to help with the pain.  Her anxiety has been okay.  She thinks Effexor helps significantly and allows her to relax.  She noticed that she has been more anxious since running low on Effexor.     Review of Systems  Constitutional: Negative for fever, chills, diaphoresis, appetite change and fatigue.    HENT: Positive for sore throat.   Respiratory: Positive for chest tightness. Negative for cough, choking and shortness of breath.   Cardiovascular: Negative for chest pain and palpitations.  Gastrointestinal: Negative for nausea, vomiting, abdominal pain, diarrhea and constipation.  Endocrine: Positive for polyphagia and polyuria.  Genitourinary: Positive for frequency. Negative for dysuria and difficulty urinating.  Musculoskeletal: Positive for arthralgias, back pain and myalgias.  Skin: Negative for rash.  Neurological: Positive for dizziness and light-headedness. Negative for numbness.  Psychiatric/Behavioral: Positive for sleep disturbance. Negative for suicidal ideas and dysphoric mood. The patient is nervous/anxious.        Objective:   Physical Exam  Constitutional: She is oriented to person, place, and time. She appears well-developed and well-nourished. No distress.  HENT:  Head: Normocephalic and atraumatic.  Mouth/Throat: No oropharyngeal exudate.  Eyes: Conjunctivae are normal. Pupils are equal, round, and reactive to light.  Neck: Normal range of motion. Neck supple. No thyromegaly present.  Cardiovascular: Normal rate, regular rhythm, normal heart sounds and intact distal pulses.   Pulmonary/Chest: Effort normal and breath sounds normal.  Abdominal: Soft. Bowel sounds are normal. She exhibits no distension. There is no tenderness.  Musculoskeletal: Normal range of motion. She exhibits no edema.  Pain with weight bearing in bilateral knees.  Lymphadenopathy:    She has no cervical adenopathy.  Neurological: She is alert and oriented to person, place, and time. No cranial nerve deficit. Coordination normal.  Skin: Skin  is warm and dry. No rash noted. She is not diaphoretic.  Psychiatric: She has a normal mood and affect.          Assessment & Plan:  Please see problem-based assessment and plan.

## 2013-12-04 LAB — MICROALBUMIN / CREATININE URINE RATIO
Creatinine, Urine: 70.5 mg/dL
Microalb Creat Ratio: 7.1 mg/g (ref 0.0–30.0)
Microalb, Ur: 0.5 mg/dL (ref 0.00–1.89)

## 2013-12-04 NOTE — Progress Notes (Signed)
INTERNAL MEDICINE TEACHING ATTENDING ADDENDUM - Morgan Contes, MD: I personally saw and evaluated Morgan Wilkerson in this clinic visit in conjunction with the resident, Dr. Trudee Kuster. I have discussed patient's plan of care with medical resident during this visit. I have confirmed the physical exam findings and have read and agree with the clinic note including the plan with the following addition: - On exam: Cardio- RRR, normal heart sounds, Lungs- CTA b/l - Patient complains of intermittent lightheadedness which she attributes to her sugars - Patient asked to check blood sugars 1-2 times per day to evaluate for hypoglycemia - BP mildly elevated likely secondary to medicine non compliance. Patient was educated regarding compliance with meds

## 2013-12-05 ENCOUNTER — Ambulatory Visit: Payer: Medicare Other | Admitting: Internal Medicine

## 2013-12-23 ENCOUNTER — Encounter: Payer: Medicare Other | Attending: Physical Medicine & Rehabilitation | Admitting: Registered Nurse

## 2013-12-23 ENCOUNTER — Encounter: Payer: Self-pay | Admitting: Registered Nurse

## 2013-12-23 VITALS — BP 150/84 | HR 87 | Resp 14 | Wt 182.6 lb

## 2013-12-23 DIAGNOSIS — R9431 Abnormal electrocardiogram [ECG] [EKG]: Secondary | ICD-10-CM | POA: Diagnosis present

## 2013-12-23 DIAGNOSIS — M19012 Primary osteoarthritis, left shoulder: Secondary | ICD-10-CM

## 2013-12-23 DIAGNOSIS — M170 Bilateral primary osteoarthritis of knee: Secondary | ICD-10-CM

## 2013-12-23 DIAGNOSIS — IMO0001 Reserved for inherently not codable concepts without codable children: Secondary | ICD-10-CM | POA: Diagnosis not present

## 2013-12-23 DIAGNOSIS — G894 Chronic pain syndrome: Secondary | ICD-10-CM | POA: Diagnosis present

## 2013-12-23 DIAGNOSIS — F329 Major depressive disorder, single episode, unspecified: Secondary | ICD-10-CM

## 2013-12-23 DIAGNOSIS — Z5181 Encounter for therapeutic drug level monitoring: Secondary | ICD-10-CM

## 2013-12-23 DIAGNOSIS — M1710 Unilateral primary osteoarthritis, unspecified knee: Secondary | ICD-10-CM

## 2013-12-23 DIAGNOSIS — M19019 Primary osteoarthritis, unspecified shoulder: Secondary | ICD-10-CM

## 2013-12-23 DIAGNOSIS — F3289 Other specified depressive episodes: Secondary | ICD-10-CM

## 2013-12-23 DIAGNOSIS — Z79899 Other long term (current) drug therapy: Secondary | ICD-10-CM

## 2013-12-23 DIAGNOSIS — M19011 Primary osteoarthritis, right shoulder: Secondary | ICD-10-CM

## 2013-12-23 DIAGNOSIS — M797 Fibromyalgia: Secondary | ICD-10-CM

## 2013-12-23 DIAGNOSIS — IMO0002 Reserved for concepts with insufficient information to code with codable children: Secondary | ICD-10-CM

## 2013-12-23 DIAGNOSIS — F32A Depression, unspecified: Secondary | ICD-10-CM

## 2013-12-23 MED ORDER — HYDROCODONE-ACETAMINOPHEN 10-325 MG PO TABS: 1.0000 | ORAL_TABLET | Freq: Four times a day (QID) | ORAL | Status: DC | PRN

## 2013-12-23 MED ORDER — METHADONE HCL 10 MG PO TABS: 20.0000 mg | ORAL_TABLET | Freq: Two times a day (BID) | ORAL | Status: DC

## 2013-12-23 NOTE — Progress Notes (Signed)
Subjective:    Patient ID: Morgan Wilkerson, female    DOB: 1956-03-09, 58 y.o.   MRN: [id number]  HPI: [name] is a 58 year old female who returns for follow up for chronic pain and medication refill. She says her pain is located in her neck,and mid- lower back and bilateral lower extremities. She says she occasionally has headaches, she denies at this time. She rates her pain 7. Her current exercise regime is performing stretching exercises and using the pulleys for the upper and lower extremities.  Pain Inventory Average Pain 8 Pain Right Now 7 My pain is intermittent, constant, sharp, stabbing and aching  In the last 24 hours, has pain interfered with the following? General activity 4 Relation with others 5 Enjoyment of life 4 What TIME of day is your pain at its worst? morning and night Sleep (in general) Fair  Pain is worse with: walking, bending, sitting, inactivity, standing and some activites Pain improves with: heat/ice, medication and TENS Relief from Meds: 5  Mobility walk with assistance how many minutes can you walk? 25 ability to climb steps?  no do you drive?  no  Function disabled: date disabled . I need assistance with the following:  bathing, meal prep, household duties and shopping  Neuro/Psych weakness numbness tremor tingling trouble walking spasms dizziness confusion depression anxiety  Prior Studies Any changes since last visit?  no  Physicians involved in your care Any changes since last visit?  no   Family History  Problem Relation Age of Onset  . Hypertension Mother   . Cerebral aneurysm Maternal Aunt   . Early death Father 39    Raod accident  . Healthy Sister   . Healthy Brother   . Healthy Daughter   . Cerebral aneurysm Maternal Grandfather   . Cerebral aneurysm Maternal Aunt   . Healthy Sister   . Healthy Sister   . Healthy Sister   . Healthy Sister   . Healthy Brother   . Healthy Brother   . Healthy  Grandchild    History   Social History  . Marital Status: Married    Spouse Name: N/A    Number of Children: N/A  . Years of Education: 14   Occupational History  .     Social History Main Topics  . Smoking status: Never Smoker   . Smokeless tobacco: Never Used  . Alcohol Use: No     Comment: Rarely.   . Drug Use: No  . Sexual Activity: Not Currently   Other Topics Concern  . None   Social History Narrative   Patients phone number is [phone]   Past Surgical History  Procedure Laterality Date  . Temporal artery biopsy / ligation Left 6/09    Negative for temperal arteritis  . Cystoscopy      With hydrolic bladder distension  . Vaginal hysterectomy  2003    with oophorectomy, for dysfunctional uterine bleeding  . Cholecystectomy      for chronic acalculus cholecystitis  . Tubal ligation     Past Medical History  Diagnosis Date  . Essential hypertension   . Toe fracture 2001/2002    Left pinky toe  . Gastritis     Chronic, per biopsy  . Depression   . Seasonal allergic rhinitis     Spring time  . Gastroesophageal reflux disease   . Anxiety     Panic disorder  . Anemia     Anemia of chronic  disease  . Fibromyalgia   . Chronic pain syndrome   . Osteopenia     DEXA 05/06/2008: L spine T -1.0, Left femur T -0.9, Right femur T -1.1  . Inappropriate sinus tachycardia   . Refusal of blood transfusions as patient is Jehovah's Witness   . Type II diabetes mellitus 6/09  . Migraine     "q 2-3 months" (10/15/2013)  . Osteoarthritis of both knees     Tricompartmental, worse in lateral compartment  . Osteoarthritis of both shoulders     Mild  . Chronic back pain    BP 150/84  Pulse 87  Resp 14  Wt 182 lb 9.6 oz (82.827 kg)  SpO2 97%  LMP 03/08/2002  Opioid Risk Score:   Fall Risk Score: Moderate Fall Risk (6-13 points) (previoulsy educated and given handout) Review of Systems  Musculoskeletal: Positive for gait problem.       Spasms  Neurological:  Positive for dizziness, tremors, weakness and numbness.       Tingling  Psychiatric/Behavioral: Positive for confusion and dysphoric mood. The patient is nervous/anxious.   All other systems reviewed and are negative.      Objective:   Physical Exam  Nursing note and vitals reviewed. Constitutional: She is oriented to person, place, and time. She appears well-developed and well-nourished.  HENT:  Head: Normocephalic and atraumatic.  Neck: Normal range of motion. Neck supple.  Cardiovascular: Normal rate and regular rhythm.   Pulmonary/Chest: Effort normal and breath sounds normal.  Musculoskeletal:  Normal Muscle Bulk and Muscle testing Reveals: Upper Extremities: Full ROM and Muscle Strength 5/5 Thoracic and Lumbar Hypersensitivity Spinal Forward Flexion 30 Degrees and Extension 10 Degrees Lower Extremities: Full ROM and Muscle Strength 5/5 Bilateral Lower Extremities Flexion Produces Pain into patella and laterally to lower extremities Arises from chair with ease Uses Crutches for support Narrow Based gait  Neurological: She is alert and oriented to person, place, and time.  Skin: Skin is warm and dry.  Psychiatric: She has a normal mood and affect.          Assessment & Plan:  1. Fibromyalgia syndrome: Continue with exercise, heat and ice therapy.  2. 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  3. Myofascial shoulder pain: Continue Current Medication Regime.  4. Depression: On Effexor. Continue to monitor.  6. History of insomnia. No Complaints Today. Continue to monitor.  7. Osteoarthritis of the knees: Continue Current Medication Regime.   20 minutes of face to face patient care time was spent during this visit. All questions were encouraged and answered.   F/U in 2 months

## 2014-01-01 ENCOUNTER — Encounter: Payer: Self-pay | Admitting: *Deleted

## 2014-02-03 ENCOUNTER — Encounter: Payer: Self-pay | Admitting: Internal Medicine

## 2014-02-03 NOTE — Addendum Note (Signed)
Addended by: Hulan Fray on: 02/03/2014 07:24 PM   Modules accepted: Orders

## 2014-02-04 ENCOUNTER — Encounter: Payer: Self-pay | Admitting: *Deleted

## 2014-02-13 ENCOUNTER — Telehealth: Payer: Self-pay | Admitting: *Deleted

## 2014-02-13 NOTE — Telephone Encounter (Signed)
Pt called into clinic less than an hour ago stating she had a sore throat that has lasted several days and she is wanting antibiotics. Allergy meds are not helping.  I called pt back to get more information and no answer.  Her phone will not take messages.

## 2014-02-14 ENCOUNTER — Telehealth: Payer: Self-pay | Admitting: *Deleted

## 2014-02-14 NOTE — Telephone Encounter (Signed)
thanks

## 2014-02-14 NOTE — Telephone Encounter (Signed)
Pt calls and states she has a sore throat and does not feel well x 2 weeks, sore throat comes and goes, she has taken pain medicine for it today and it didn't help much at all, she states she has not checked her temp so does not know if she has fever but "i probably do", she would like to be seen today, no appts available, referred to urg care, states she will go

## 2014-02-21 ENCOUNTER — Encounter: Payer: Medicare Other | Admitting: Registered Nurse

## 2014-02-24 ENCOUNTER — Other Ambulatory Visit: Payer: Self-pay | Admitting: *Deleted

## 2014-02-24 DIAGNOSIS — M25569 Pain in unspecified knee: Secondary | ICD-10-CM

## 2014-02-24 MED ORDER — DICLOFENAC SODIUM 1 % TD GEL: 2.0000 g | Freq: Four times a day (QID) | TRANSDERMAL | Status: DC

## 2014-02-24 NOTE — Telephone Encounter (Signed)
recvd fax from pharmacy... Sent electronically

## 2014-03-07 ENCOUNTER — Encounter: Payer: Medicare Other | Admitting: Registered Nurse

## 2014-03-07 ENCOUNTER — Telehealth: Payer: Self-pay | Admitting: *Deleted

## 2014-03-07 NOTE — Telephone Encounter (Signed)
Patient called asking for [place] to call [place] and refill the Voltaren gel

## 2014-03-11 ENCOUNTER — Telehealth: Payer: Self-pay | Admitting: *Deleted

## 2014-03-11 DIAGNOSIS — M25569 Pain in unspecified knee: Secondary | ICD-10-CM

## 2014-03-11 MED ORDER — DICLOFENAC SODIUM 1 % TD GEL: 2.0000 g | Freq: Four times a day (QID) | TRANSDERMAL | Status: DC

## 2014-03-11 NOTE — Telephone Encounter (Signed)
Calling about refill on her voltaren gel. It was ordered in October but she says [facility] says there are no refills.  I have sent the order again and notified Hassan Rowan

## 2014-04-07 ENCOUNTER — Other Ambulatory Visit: Payer: Self-pay | Admitting: Physical Medicine & Rehabilitation

## 2014-04-07 ENCOUNTER — Encounter: Payer: Medicare Other | Attending: Registered Nurse | Admitting: Registered Nurse

## 2014-04-07 ENCOUNTER — Encounter: Payer: Self-pay | Admitting: Registered Nurse

## 2014-04-07 VITALS — BP 166/89 | HR 94 | Resp 20 | Ht 63.5 in | Wt 185.0 lb

## 2014-04-07 DIAGNOSIS — M25552 Pain in left hip: Secondary | ICD-10-CM | POA: Diagnosis not present

## 2014-04-07 DIAGNOSIS — M79605 Pain in left leg: Secondary | ICD-10-CM | POA: Insufficient documentation

## 2014-04-07 DIAGNOSIS — Z5181 Encounter for therapeutic drug level monitoring: Secondary | ICD-10-CM

## 2014-04-07 DIAGNOSIS — G8929 Other chronic pain: Secondary | ICD-10-CM | POA: Insufficient documentation

## 2014-04-07 DIAGNOSIS — IMO0001 Reserved for inherently not codable concepts without codable children: Secondary | ICD-10-CM

## 2014-04-07 DIAGNOSIS — M25551 Pain in right hip: Secondary | ICD-10-CM | POA: Diagnosis not present

## 2014-04-07 DIAGNOSIS — R9431 Abnormal electrocardiogram [ECG] [EKG]: Secondary | ICD-10-CM

## 2014-04-07 DIAGNOSIS — M79604 Pain in right leg: Secondary | ICD-10-CM | POA: Diagnosis not present

## 2014-04-07 DIAGNOSIS — M545 Low back pain: Secondary | ICD-10-CM | POA: Insufficient documentation

## 2014-04-07 DIAGNOSIS — I4581 Long QT syndrome: Secondary | ICD-10-CM

## 2014-04-07 DIAGNOSIS — M542 Cervicalgia: Secondary | ICD-10-CM | POA: Insufficient documentation

## 2014-04-07 DIAGNOSIS — Z79899 Other long term (current) drug therapy: Secondary | ICD-10-CM

## 2014-04-07 DIAGNOSIS — Z760 Encounter for issue of repeat prescription: Secondary | ICD-10-CM | POA: Insufficient documentation

## 2014-04-07 DIAGNOSIS — M170 Bilateral primary osteoarthritis of knee: Secondary | ICD-10-CM

## 2014-04-07 DIAGNOSIS — M609 Myositis, unspecified: Secondary | ICD-10-CM

## 2014-04-07 DIAGNOSIS — M797 Fibromyalgia: Secondary | ICD-10-CM

## 2014-04-07 DIAGNOSIS — M791 Myalgia: Secondary | ICD-10-CM

## 2014-04-07 DIAGNOSIS — G894 Chronic pain syndrome: Secondary | ICD-10-CM

## 2014-04-07 MED ORDER — HYDROCODONE-ACETAMINOPHEN 10-325 MG PO TABS: 1.0000 | ORAL_TABLET | Freq: Four times a day (QID) | ORAL | Status: DC | PRN

## 2014-04-07 MED ORDER — METHADONE HCL 10 MG PO TABS: 20.0000 mg | ORAL_TABLET | Freq: Two times a day (BID) | ORAL | Status: DC

## 2014-04-07 NOTE — Addendum Note (Signed)
Addended by: Geryl Rankins D on: 04/07/2014 01:01 PM   Modules accepted: Orders

## 2014-04-07 NOTE — Progress Notes (Signed)
Subjective:    Patient ID: Morgan Wilkerson, female    DOB: Jun 09, 1955, 58 y.o.   MRN: [id number]  HPI: Morgan Wilkerson is a 58 year old female who returns for follow up for chronic pain and medication refill. She says her pain is located in her neck,and mid- lower back, bilateral hips left greater than right and bilateral lower extremities. She rates her pain 8. Her current exercise regime is performing stretching exercises and walking. Arrived to office hypertensive B/P 166/89 and blood pressure re-checked 153/88, she say's she missed her anti-hypertensives she will take her medications as soon as she goes home. Educated on compliance and she verbalizes understanding.  Pain Inventory Average Pain 8 Pain Right Now 8 My pain is constant, sharp, stabbing and aching  In the last 24 hours, has pain interfered with the following? General activity 9 Relation with others 9 Enjoyment of life 8 What TIME of day is your pain at its worst? morning, night Sleep (in general) Fair  Pain is worse with: walking, bending, sitting, inactivity, standing and some activites Pain improves with: rest, medication and injections Relief from Meds: 5  Mobility walk with assistance how many minutes can you walk? 20 ability to climb steps?  no do you drive?  no use a wheelchair Do you have any goals in this area?  yes  Function disabled: date disabled . I need assistance with the following:  bathing, meal prep, household duties and shopping Do you have any goals in this area?  yes  Neuro/Psych numbness tremor tingling trouble walking spasms dizziness confusion depression anxiety  Prior Studies Any changes since last visit?  no  Physicians involved in your care Any changes since last visit?  no   Family History  Problem Relation Age of Onset  . Hypertension Mother   . Cerebral aneurysm Maternal Aunt   . Early death Father 55    Raod accident  . Healthy Sister   . Healthy Brother     . Healthy Daughter   . Cerebral aneurysm Maternal Grandfather   . Cerebral aneurysm Maternal Aunt   . Healthy Sister   . Healthy Sister   . Healthy Sister   . Healthy Sister   . Healthy Brother   . Healthy Brother   . Healthy Grandchild    History   Social History  . Marital Status: Married    Spouse Name: N/A    Number of Children: N/A  . Years of Education: 14   Occupational History  .     Social History Main Topics  . Smoking status: Never Smoker   . Smokeless tobacco: Never Used  . Alcohol Use: No     Comment: Rarely.   . Drug Use: No  . Sexual Activity: Not Currently   Other Topics Concern  . None   Social History Narrative   Patients phone number is [phone]   Past Surgical History  Procedure Laterality Date  . Temporal artery biopsy / ligation Left 6/09    Negative for temperal arteritis  . Cystoscopy      With hydrolic bladder distension  . Vaginal hysterectomy  2003    with oophorectomy, for dysfunctional uterine bleeding  . Cholecystectomy      for chronic acalculus cholecystitis  . Tubal ligation     Past Medical History  Diagnosis Date  . Essential hypertension   . Toe fracture 2001/2002    Left pinky toe  . Gastritis  Chronic, per biopsy  . Depression   . Seasonal allergic rhinitis     Spring time  . Gastroesophageal reflux disease   . Anxiety     Panic disorder  . Anemia     Anemia of chronic disease  . Fibromyalgia   . Chronic pain syndrome   . Osteopenia     DEXA 05/06/2008: L spine T -1.0, Left femur T -0.9, Right femur T -1.1  . Inappropriate sinus tachycardia   . Refusal of blood transfusions as patient is Jehovah's Witness   . Type II diabetes mellitus 6/09  . Migraine     "q 2-3 months" (10/15/2013)  . Osteoarthritis of both knees     Tricompartmental, worse in lateral compartment  . Osteoarthritis of both shoulders     Mild  . Chronic back pain    BP 166/89 mmHg  Pulse 94  Resp 20  Ht 5' 3.5" (1.613 m)  Wt  185 lb (83.915 kg)  BMI 32.25 kg/m2  SpO2 100%  LMP 03/08/2002  Opioid Risk Score:   Fall Risk Score:   Review of Systems  Constitutional:       Trouble walking   Neurological: Positive for dizziness, tremors and numbness.       Spasms tingling  Psychiatric/Behavioral: Positive for confusion and dysphoric mood. The patient is nervous/anxious.   All other systems reviewed and are negative.      Objective:   Physical Exam  Constitutional: She is oriented to person, place, and time. She appears well-developed and well-nourished.  HENT:  Head: Normocephalic and atraumatic.  Neck: Normal range of motion. Neck supple.  Cervical Paraspinal Tenderness: C-6- C-7  Cardiovascular: Normal rate and regular rhythm.   Pulmonary/Chest: Effort normal and breath sounds normal.  Musculoskeletal:  Normal Muscle Bulk and Muscle Testing Reveals: Upper Extremities: Full ROM and Muscle Strength 5/5 Thoracic and Lumbar Hypersensitivity Lower Extremities: Full ROM and Muscle strength 5/5 Right Leg Flexion Produces pain into Groin Left Leg Flexion Produces Pain into Left Hip and Patella Arises from chair with ease Using single crutch   Neurological: She is alert and oriented to person, place, and time.  Skin: Skin is warm and dry.  Psychiatric: She has a normal mood and affect.  Nursing note and vitals reviewed.         Assessment & Plan:  1. Fibromyalgia syndrome: Continue with exercise, heat and ice therapy.  2. 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  3. Myofascial shoulder pain: Continue Current Medication Regime.  4. Depression: On Effexor. Continue to monitor.  6. History of insomnia. No Complaints Today. Continue to monitor.  7. Osteoarthritis of the knees: Continue Current Medication Regime.   20 minutes of face to face patient care  time was spent during this visit. All questions were encouraged and answered.   F/U in 2 months

## 2014-04-08 LAB — PMP ALCOHOL METABOLITE (ETG): Ethyl Glucuronide (EtG): NEGATIVE ng/mL

## 2014-04-10 LAB — PRESCRIPTION MONITORING PROFILE (SOLSTAS)
Amphetamine/Meth: NEGATIVE ng/mL
BENZODIAZEPINE SCREEN, URINE: NEGATIVE ng/mL
BUPRENORPHINE, URINE: NEGATIVE ng/mL
Barbiturate Screen, Urine: NEGATIVE ng/mL
Cannabinoid Scrn, Ur: NEGATIVE ng/mL
Carisoprodol, Urine: NEGATIVE ng/mL
Cocaine Metabolites: NEGATIVE ng/mL
Creatinine, Urine: 213.2 mg/dL (ref >20.0)
ECSTASY: NEGATIVE ng/mL
FENTANYL URINE: NEGATIVE ng/mL
Meperidine, Ur: NEGATIVE ng/mL
NITRITES URINE, INITIAL: NEGATIVE ug/mL
Oxycodone Screen, Ur: NEGATIVE ng/mL
PROPOXYPHENE: NEGATIVE ng/mL
TAPENTADOLUR: NEGATIVE ng/mL
Tramadol Scrn, Ur: NEGATIVE ng/mL
Zolpidem, Urine: NEGATIVE ng/mL
pH, Initial: 5.6 pH (ref 4.5–8.9)

## 2014-04-10 LAB — OPIATES/OPIOIDS (LC/MS-MS)
Codeine Urine: NEGATIVE ng/mL (ref <50)
HYDROMORPHONE: 417 ng/mL (ref <50)
Hydrocodone: 2323 ng/mL (ref <50)
MORPHINE: NEGATIVE ng/mL (ref <50)
Norhydrocodone, Ur: 11606 ng/mL (ref <50)
Noroxycodone, Ur: NEGATIVE ng/mL (ref <50)
OXYCODONE, UR: NEGATIVE ng/mL (ref <50)
Oxymorphone: NEGATIVE ng/mL (ref <50)

## 2014-04-10 LAB — METHADONE (GC/LC/MS), URINE
EDDP (GC/LC/MS), ur confirm: 5754 ng/mL (ref <100)
Methadone (GC/LC/MS), ur confirm: 4836 ng/mL (ref <100)

## 2014-04-11 ENCOUNTER — Encounter: Payer: Self-pay | Admitting: *Deleted

## 2014-04-22 NOTE — Progress Notes (Signed)
Urine drug screen for this encounter is consistent 

## 2014-04-25 ENCOUNTER — Other Ambulatory Visit: Payer: Self-pay | Admitting: Internal Medicine

## 2014-05-21 ENCOUNTER — Ambulatory Visit: Payer: Medicare Other | Admitting: Internal Medicine

## 2014-05-28 ENCOUNTER — Ambulatory Visit: Payer: Medicare Other | Admitting: Internal Medicine

## 2014-06-06 ENCOUNTER — Ambulatory Visit: Payer: Medicare Other | Admitting: Internal Medicine

## 2014-06-11 ENCOUNTER — Encounter: Payer: Medicare HMO | Attending: Registered Nurse | Admitting: Registered Nurse

## 2014-06-11 DIAGNOSIS — M79605 Pain in left leg: Secondary | ICD-10-CM | POA: Insufficient documentation

## 2014-06-11 DIAGNOSIS — M545 Low back pain: Secondary | ICD-10-CM | POA: Insufficient documentation

## 2014-06-11 DIAGNOSIS — Z760 Encounter for issue of repeat prescription: Secondary | ICD-10-CM | POA: Insufficient documentation

## 2014-06-11 DIAGNOSIS — M25551 Pain in right hip: Secondary | ICD-10-CM | POA: Insufficient documentation

## 2014-06-11 DIAGNOSIS — G8929 Other chronic pain: Secondary | ICD-10-CM | POA: Insufficient documentation

## 2014-06-11 DIAGNOSIS — M542 Cervicalgia: Secondary | ICD-10-CM | POA: Insufficient documentation

## 2014-06-11 DIAGNOSIS — M25552 Pain in left hip: Secondary | ICD-10-CM | POA: Insufficient documentation

## 2014-06-11 DIAGNOSIS — M79604 Pain in right leg: Secondary | ICD-10-CM | POA: Insufficient documentation

## 2014-06-19 ENCOUNTER — Encounter: Payer: Self-pay | Admitting: Registered Nurse

## 2014-06-19 ENCOUNTER — Encounter (HOSPITAL_BASED_OUTPATIENT_CLINIC_OR_DEPARTMENT_OTHER): Payer: Medicare HMO | Admitting: Registered Nurse

## 2014-06-19 VITALS — BP 150/82 | HR 92 | Resp 14

## 2014-06-19 DIAGNOSIS — M25551 Pain in right hip: Secondary | ICD-10-CM | POA: Diagnosis not present

## 2014-06-19 DIAGNOSIS — I4581 Long QT syndrome: Secondary | ICD-10-CM

## 2014-06-19 DIAGNOSIS — M79605 Pain in left leg: Secondary | ICD-10-CM | POA: Diagnosis not present

## 2014-06-19 DIAGNOSIS — M545 Low back pain: Secondary | ICD-10-CM | POA: Diagnosis not present

## 2014-06-19 DIAGNOSIS — G894 Chronic pain syndrome: Secondary | ICD-10-CM

## 2014-06-19 DIAGNOSIS — R9431 Abnormal electrocardiogram [ECG] [EKG]: Secondary | ICD-10-CM

## 2014-06-19 DIAGNOSIS — Z79899 Other long term (current) drug therapy: Secondary | ICD-10-CM

## 2014-06-19 DIAGNOSIS — M62830 Muscle spasm of back: Secondary | ICD-10-CM

## 2014-06-19 DIAGNOSIS — Z760 Encounter for issue of repeat prescription: Secondary | ICD-10-CM | POA: Diagnosis not present

## 2014-06-19 DIAGNOSIS — R413 Other amnesia: Secondary | ICD-10-CM

## 2014-06-19 DIAGNOSIS — M25552 Pain in left hip: Secondary | ICD-10-CM | POA: Diagnosis not present

## 2014-06-19 DIAGNOSIS — M542 Cervicalgia: Secondary | ICD-10-CM | POA: Diagnosis not present

## 2014-06-19 DIAGNOSIS — M79604 Pain in right leg: Secondary | ICD-10-CM | POA: Diagnosis not present

## 2014-06-19 DIAGNOSIS — M170 Bilateral primary osteoarthritis of knee: Secondary | ICD-10-CM

## 2014-06-19 DIAGNOSIS — M797 Fibromyalgia: Secondary | ICD-10-CM

## 2014-06-19 DIAGNOSIS — IMO0001 Reserved for inherently not codable concepts without codable children: Secondary | ICD-10-CM

## 2014-06-19 DIAGNOSIS — G8929 Other chronic pain: Secondary | ICD-10-CM | POA: Diagnosis present

## 2014-06-19 DIAGNOSIS — Z5181 Encounter for therapeutic drug level monitoring: Secondary | ICD-10-CM

## 2014-06-19 DIAGNOSIS — M609 Myositis, unspecified: Secondary | ICD-10-CM

## 2014-06-19 DIAGNOSIS — M791 Myalgia: Secondary | ICD-10-CM

## 2014-06-19 MED ORDER — METHADONE HCL 10 MG PO TABS: 20.0000 mg | ORAL_TABLET | Freq: Two times a day (BID) | ORAL | Status: DC

## 2014-06-19 MED ORDER — GABAPENTIN 100 MG PO CAPS: 100.0000 mg | ORAL_CAPSULE | Freq: Three times a day (TID) | ORAL | Status: DC

## 2014-06-19 MED ORDER — HYDROCODONE-ACETAMINOPHEN 10-325 MG PO TABS: 1.0000 | ORAL_TABLET | Freq: Four times a day (QID) | ORAL | Status: DC | PRN

## 2014-06-19 MED ORDER — BACLOFEN 10 MG PO TABS: 10.0000 mg | ORAL_TABLET | Freq: Two times a day (BID) | ORAL | Status: DC

## 2014-06-19 NOTE — Progress Notes (Signed)
Subjective:    Patient ID: Morgan Wilkerson, female    DOB: 17-Apr-1956, 59 y.o.   MRN: [id number]  HPI: Morgan Wilkerson is a 59 year old female who returns for follow up for chronic pain and medication refill. She says her pain is located in her right shoulder, mid- lower back, and left hip.Also having frequent muscle spasms in right arm and lower back. Baclofen prescribed today. She rates her pain 9. Her current exercise regime is performing stretching exercises and walking. She states she is having memory loss and recollection. The other week she lost her purse in her home took her a week to remember where she placed it. Neurology referral made for [facility] related to Alzheimer's. She verbalizes understanding. Also will F/U with her PCP. Asking questions about knee injections ( Synvisc), she would like to speak to Dr. Naaman Plummer. F/U appointment with Dr. Naaman Plummer. She verbalizes understanding.  Pain Inventory Average Pain 7 Pain Right Now 9 My pain is constant, sharp, stabbing and aching  In the last 24 hours, has pain interfered with the following? General activity 6 Relation with others 6 Enjoyment of life 6 What TIME of day is your pain at its worst? VARIES Sleep (in general) Fair  Pain is worse with: walking, bending, sitting, standing and some activites Pain improves with: rest and medication Relief from Meds: 5  Mobility walk with assistance how many minutes can you walk? 5 ability to climb steps?  yes do you drive?  yes  Function disabled: date disabled .  Neuro/Psych weakness numbness trouble walking anxiety  Prior Studies Any changes since last visit?  no  Physicians involved in your care Any changes since last visit?  no   Family History  Problem Relation Age of Onset  . Hypertension Mother   . Cerebral aneurysm Maternal Aunt   . Early death Father 15    Raod accident  . Healthy Sister   . Healthy Brother   . Healthy Daughter   . Cerebral aneurysm  Maternal Grandfather   . Cerebral aneurysm Maternal Aunt   . Healthy Sister   . Healthy Sister   . Healthy Sister   . Healthy Sister   . Healthy Brother   . Healthy Brother   . Healthy Grandchild    History   Social History  . Marital Status: Married    Spouse Name: N/A  . Number of Children: N/A  . Years of Education: 14   Occupational History  .     Social History Main Topics  . Smoking status: Never Smoker   . Smokeless tobacco: Never Used  . Alcohol Use: No     Comment: Rarely.   . Drug Use: No  . Sexual Activity: Not Currently   Other Topics Concern  . None   Social History Narrative   Patients phone number is [phone]   Past Surgical History  Procedure Laterality Date  . Temporal artery biopsy / ligation Left 6/09    Negative for temperal arteritis  . Cystoscopy      With hydrolic bladder distension  . Vaginal hysterectomy  2003    with oophorectomy, for dysfunctional uterine bleeding  . Cholecystectomy      for chronic acalculus cholecystitis  . Tubal ligation     Past Medical History  Diagnosis Date  . Essential hypertension   . Toe fracture 2001/2002    Left pinky toe  . Gastritis     Chronic, per biopsy  .  Depression   . Seasonal allergic rhinitis     Spring time  . Gastroesophageal reflux disease   . Anxiety     Panic disorder  . Anemia     Anemia of chronic disease  . Fibromyalgia   . Chronic pain syndrome   . Osteopenia     DEXA 05/06/2008: L spine T -1.0, Left femur T -0.9, Right femur T -1.1  . Inappropriate sinus tachycardia   . Refusal of blood transfusions as patient is Jehovah's Witness   . Type II diabetes mellitus 6/09  . Migraine     "q 2-3 months" (10/15/2013)  . Osteoarthritis of both knees     Tricompartmental, worse in lateral compartment  . Osteoarthritis of both shoulders     Mild  . Chronic back pain    BP 150/82 mmHg  Pulse 92  Resp 14  SpO2 100%  LMP 03/08/2002  Opioid Risk Score:   Fall Risk Score:  Moderate Fall Risk (6-13 points)  Review of Systems  HENT: Negative.   Eyes: Negative.   Respiratory: Negative.   Cardiovascular: Negative.   Gastrointestinal: Negative.   Endocrine: Negative.   Genitourinary: Negative.   Musculoskeletal: Positive for myalgias, back pain and arthralgias.  Skin: Negative.   Allergic/Immunologic: Negative.   Neurological: Positive for weakness and numbness.  Hematological: Negative.   Psychiatric/Behavioral: The patient is nervous/anxious.        Objective:   Physical Exam  Constitutional: She is oriented to person, place, and time. She appears well-developed and well-nourished.  HENT:  Head: Normocephalic and atraumatic.  Neck: Normal range of motion. Neck supple.  Cardiovascular: Normal rate and regular rhythm.   Pulmonary/Chest: Effort normal and breath sounds normal.  Musculoskeletal:  Normal Muscle Bulk and Muscle Testing Reveals: Upper Extremities: Full ROM and Muscle Strength 5/5 Thoracic and Lumbar Paraspinal Tenderness Lower Extremities: Full ROM and Muscle Strength 5/5 Arises from chair with ease/ Using One Crutch Narrow Based gait  Neurological: She is alert and oriented to person, place, and time.  Skin: Skin is warm and dry.  Psychiatric: She has a normal mood and affect.  Nursing note and vitals reviewed.         Assessment & Plan:  1. Fibromyalgia syndrome: Continue with exercise, heat and ice therapy.  2. 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 Second script given 3. Myofascial shoulder pain: Continue Current Medication Regime.  4. Depression: On Effexor. Continue to monitor.  6. History of insomnia. No Complaints Today. Continue to monitor.  7. Osteoarthritis of the knees: Continue Voltaren Gel.  20 minutes of face to face patient care time was spent during this visit. All  questions were encouraged and answered.

## 2014-06-19 NOTE — Patient Instructions (Signed)
Follow up with Primary regarding Memory loss and Make Appointmen with Neurology

## 2014-06-25 ENCOUNTER — Encounter: Payer: Self-pay | Admitting: Internal Medicine

## 2014-06-25 ENCOUNTER — Ambulatory Visit (INDEPENDENT_AMBULATORY_CARE_PROVIDER_SITE_OTHER): Payer: Medicare PPO | Admitting: Internal Medicine

## 2014-06-25 VITALS — BP 139/95 | HR 85 | Temp 98.2°F | Wt 188.3 lb

## 2014-06-25 DIAGNOSIS — I10 Essential (primary) hypertension: Secondary | ICD-10-CM

## 2014-06-25 DIAGNOSIS — Z0000 Encounter for general adult medical examination without abnormal findings: Secondary | ICD-10-CM

## 2014-06-25 DIAGNOSIS — E119 Type 2 diabetes mellitus without complications: Secondary | ICD-10-CM

## 2014-06-25 LAB — POCT GLYCOSYLATED HEMOGLOBIN (HGB A1C): Hemoglobin A1C: 5.8

## 2014-06-25 LAB — GLUCOSE, CAPILLARY: GLUCOSE-CAPILLARY: 113 mg/dL — AB (ref 70–99)

## 2014-06-25 NOTE — Patient Instructions (Signed)
He had been doing a good job in controlling her blood sugars. I recommend that you discontinue your metformin since your A1c came back 5.8 today. Your A1c has been normal for the last several visits. Additionally, I encouraged you to continue thinking about getting a colonoscopy and flu vaccine at your next visit. Continue doing a good job controlling her blood pressure.  General Instructions:   Thank you for bringing your medicines today. This helps [place] keep you safe from mistakes.   Progress Toward Treatment Goals:  Treatment Goal 06/25/2014  Hemoglobin A1C -  Blood pressure unchanged  Prevent falls -    Self Care Goals & Plans:  Self Care Goal 12/03/2013  Manage my medications -  Monitor my health keep track of my blood glucose; bring my glucose meter and log to each visit; keep track of my blood pressure  Eat healthy foods drink diet soda or water instead of juice or soda; eat more vegetables; eat fruit for snacks and desserts; eat smaller portions; eat baked foods instead of fried foods  Be physically active -  Prevent falls -  Meeting treatment goals -    Home Blood Glucose Monitoring 12/03/2013  Check my blood sugar once a day  When to check my blood sugar before breakfast     Care Management & Community Referrals:  Referral 10/15/2013  Referrals made for care management support none needed  Referrals made to community resources -

## 2014-06-25 NOTE — Assessment & Plan Note (Signed)
Lab Results  Component Value Date   HGBA1C 5.8 06/25/2014   HGBA1C 5.5 10/15/2013   HGBA1C 5.9 08/22/2013     Assessment: Diabetes control:   Progress toward A1C goal:    Comments: Patient states that she has been compliant with her metformin 500 mg daily.  Plan: Medications:  Stop metformin at this time. Continue with diet and exercise. Home glucose monitoring: Frequency:   Timing:   Instruction/counseling given: discussed foot care Educational resources provided:   Self management tools provided:   Other plans: None

## 2014-06-25 NOTE — Assessment & Plan Note (Signed)
Patient is declining a colonoscopy and flu vaccine today. I discussed at length the risks of colon cancer and the benefits of proceeding routine screening for this. Patient has received colonoscopies in the past. Patient states that she is hesitant to have another procedure because of the bowel prep. Additionally, patient is declining a flu vaccine because she says that it makes her feel bad afterwards. I encouraged her to continue thinking about both preventative measures as they can both benefit her health.

## 2014-06-25 NOTE — Progress Notes (Signed)
   Subjective:    Patient ID: Morgan Wilkerson, female    DOB: 1955-09-16, 59 y.o.   MRN: [id number]  HPI  Patient is a 59 year old female with a history of fibromyalgia, degenerative joint disease, depression, hypertension, type 2 diabetes who presents to clinic for a routine checkup.  Please see problem based charting for more details.  Review of Systems  Constitutional: Negative for fever and chills.  HENT: Negative for rhinorrhea and sore throat.   Eyes: Negative for visual disturbance.  Respiratory: Negative for cough and shortness of breath.   Cardiovascular: Negative for chest pain and palpitations.  Gastrointestinal: Negative for nausea, vomiting, abdominal pain, diarrhea, constipation and blood in stool.  Genitourinary: Negative for dysuria and hematuria.  Neurological: Negative for syncope.       Objective:   Physical Exam  Constitutional: She is oriented to person, place, and time. She appears well-developed and well-nourished. No distress.  HENT:  Head: Normocephalic and atraumatic.  Eyes: EOM are normal. Pupils are equal, round, and reactive to light. Left eye exhibits no discharge.  Neck: Normal range of motion. Neck supple. No thyromegaly present.  Cardiovascular: Normal rate and regular rhythm.  Exam reveals no gallop and no friction rub.   No murmur heard. Pulmonary/Chest: Effort normal and breath sounds normal. No respiratory distress. She has no wheezes. She has no rales.  Abdominal: Soft. Bowel sounds are normal. She exhibits no distension. There is no tenderness. There is no rebound.  Musculoskeletal: She exhibits no edema.  Neurological: She is alert and oriented to person, place, and time. No cranial nerve deficit.  Skin: Skin is warm and dry. No rash noted.  Psychiatric: She has a normal mood and affect. Thought content normal.          Assessment & Plan:  Please see problem based charting for more details.

## 2014-06-25 NOTE — Assessment & Plan Note (Signed)
BP Readings from Last 3 Encounters:  06/25/14 139/95  06/19/14 150/82  04/07/14 166/89    Lab Results  Component Value Date   NA 137 10/15/2013   K 4.7 10/15/2013   CREATININE 0.91 10/15/2013    Assessment: Blood pressure control: moderately elevated Progress toward BP goal:  unchanged Comments: Patient states that she is compliant with her metoprolol 12.5 mg twice a day.  Plan: Medications:  continue current medications Educational resources provided:   Self management tools provided:   Other plans: None

## 2014-06-27 NOTE — Progress Notes (Signed)
INTERNAL MEDICINE TEACHING ATTENDING ADDENDUM - Nakeda Lebron, MD: I reviewed and discussed at the time of visit with the resident Dr. Ngo, the patient's medical history, physical examination, diagnosis and results of pertinent tests and treatment and I agree with the patient's care as documented.  

## 2014-07-01 ENCOUNTER — Telehealth: Payer: Self-pay | Admitting: *Deleted

## 2014-07-01 NOTE — Telephone Encounter (Signed)
Her primary care has been prescribing this medication. I called her to ask her to call PCP.  She thought that she had called them and mistakenly called our office

## 2014-07-01 NOTE — Telephone Encounter (Signed)
Patient called and requested a refill on her Effexor.  Please send that to [place]

## 2014-08-15 ENCOUNTER — Encounter: Payer: Medicare HMO | Attending: Registered Nurse | Admitting: Physical Medicine & Rehabilitation

## 2014-08-15 DIAGNOSIS — M25551 Pain in right hip: Secondary | ICD-10-CM | POA: Insufficient documentation

## 2014-08-15 DIAGNOSIS — M545 Low back pain: Secondary | ICD-10-CM | POA: Insufficient documentation

## 2014-08-15 DIAGNOSIS — M542 Cervicalgia: Secondary | ICD-10-CM | POA: Insufficient documentation

## 2014-08-15 DIAGNOSIS — M79604 Pain in right leg: Secondary | ICD-10-CM | POA: Insufficient documentation

## 2014-08-15 DIAGNOSIS — M79605 Pain in left leg: Secondary | ICD-10-CM | POA: Insufficient documentation

## 2014-08-15 DIAGNOSIS — M25552 Pain in left hip: Secondary | ICD-10-CM | POA: Insufficient documentation

## 2014-08-15 DIAGNOSIS — G8929 Other chronic pain: Secondary | ICD-10-CM | POA: Insufficient documentation

## 2014-08-15 DIAGNOSIS — Z760 Encounter for issue of repeat prescription: Secondary | ICD-10-CM | POA: Insufficient documentation

## 2014-08-18 ENCOUNTER — Other Ambulatory Visit: Payer: Self-pay | Admitting: Internal Medicine

## 2014-10-29 ENCOUNTER — Encounter: Payer: Medicare HMO | Attending: Registered Nurse | Admitting: Registered Nurse

## 2014-10-29 DIAGNOSIS — M25552 Pain in left hip: Secondary | ICD-10-CM | POA: Insufficient documentation

## 2014-10-29 DIAGNOSIS — Z760 Encounter for issue of repeat prescription: Secondary | ICD-10-CM | POA: Insufficient documentation

## 2014-10-29 DIAGNOSIS — M545 Low back pain: Secondary | ICD-10-CM | POA: Insufficient documentation

## 2014-10-29 DIAGNOSIS — M79605 Pain in left leg: Secondary | ICD-10-CM | POA: Insufficient documentation

## 2014-10-29 DIAGNOSIS — M25551 Pain in right hip: Secondary | ICD-10-CM | POA: Insufficient documentation

## 2014-10-29 DIAGNOSIS — G8929 Other chronic pain: Secondary | ICD-10-CM | POA: Insufficient documentation

## 2014-10-29 DIAGNOSIS — M79604 Pain in right leg: Secondary | ICD-10-CM | POA: Insufficient documentation

## 2014-10-29 DIAGNOSIS — M542 Cervicalgia: Secondary | ICD-10-CM | POA: Insufficient documentation

## 2014-11-05 ENCOUNTER — Encounter: Payer: Medicare HMO | Admitting: Registered Nurse

## 2014-11-24 ENCOUNTER — Other Ambulatory Visit: Payer: Self-pay | Admitting: Physical Medicine & Rehabilitation

## 2014-11-24 ENCOUNTER — Encounter: Payer: Self-pay | Admitting: Registered Nurse

## 2014-11-24 ENCOUNTER — Encounter: Payer: Medicare PPO | Attending: Registered Nurse | Admitting: Registered Nurse

## 2014-11-24 VITALS — BP 166/82 | HR 95 | Resp 14

## 2014-11-24 DIAGNOSIS — M542 Cervicalgia: Secondary | ICD-10-CM | POA: Diagnosis not present

## 2014-11-24 DIAGNOSIS — M25552 Pain in left hip: Secondary | ICD-10-CM | POA: Diagnosis not present

## 2014-11-24 DIAGNOSIS — M797 Fibromyalgia: Secondary | ICD-10-CM | POA: Diagnosis not present

## 2014-11-24 DIAGNOSIS — Z79899 Other long term (current) drug therapy: Secondary | ICD-10-CM

## 2014-11-24 DIAGNOSIS — M791 Myalgia: Secondary | ICD-10-CM

## 2014-11-24 DIAGNOSIS — M170 Bilateral primary osteoarthritis of knee: Secondary | ICD-10-CM | POA: Diagnosis not present

## 2014-11-24 DIAGNOSIS — M79604 Pain in right leg: Secondary | ICD-10-CM | POA: Diagnosis not present

## 2014-11-24 DIAGNOSIS — G8929 Other chronic pain: Secondary | ICD-10-CM | POA: Diagnosis not present

## 2014-11-24 DIAGNOSIS — M545 Low back pain: Secondary | ICD-10-CM | POA: Diagnosis not present

## 2014-11-24 DIAGNOSIS — M79605 Pain in left leg: Secondary | ICD-10-CM | POA: Insufficient documentation

## 2014-11-24 DIAGNOSIS — M25551 Pain in right hip: Secondary | ICD-10-CM | POA: Insufficient documentation

## 2014-11-24 DIAGNOSIS — Z760 Encounter for issue of repeat prescription: Secondary | ICD-10-CM | POA: Insufficient documentation

## 2014-11-24 DIAGNOSIS — M19211 Secondary osteoarthritis, right shoulder: Secondary | ICD-10-CM

## 2014-11-24 DIAGNOSIS — R413 Other amnesia: Secondary | ICD-10-CM

## 2014-11-24 DIAGNOSIS — M19212 Secondary osteoarthritis, left shoulder: Secondary | ICD-10-CM

## 2014-11-24 DIAGNOSIS — Z5181 Encounter for therapeutic drug level monitoring: Secondary | ICD-10-CM

## 2014-11-24 DIAGNOSIS — M609 Myositis, unspecified: Secondary | ICD-10-CM

## 2014-11-24 DIAGNOSIS — IMO0001 Reserved for inherently not codable concepts without codable children: Secondary | ICD-10-CM

## 2014-11-24 DIAGNOSIS — G894 Chronic pain syndrome: Secondary | ICD-10-CM | POA: Diagnosis not present

## 2014-11-24 MED ORDER — METHADONE HCL 10 MG PO TABS: 20.0000 mg | ORAL_TABLET | Freq: Two times a day (BID) | ORAL | Status: DC

## 2014-11-24 MED ORDER — HYDROCODONE-ACETAMINOPHEN 10-325 MG PO TABS: 1.0000 | ORAL_TABLET | Freq: Four times a day (QID) | ORAL | Status: DC | PRN

## 2014-11-24 NOTE — Progress Notes (Signed)
Subjective:    Patient ID: Morgan Wilkerson, female    DOB: [birth year], 59 y.o.   MRN: [id number]  HPI: Morgan Wilkerson is a 59 year old female who returns for follow up for chronic pain and medication refill. She says her pain is located in her lower abdomen, mid- lower back, and lower extremities. She rates her pain 9. Her current exercise regime is performing stretching exercises using bands and walking.  Morgan Wilkerson hasn't been in the office since 06/19/2014 she had two no shows and one cancellation. Also states she can't remember when her appointments are, she doesn't want this writer to discuss her memory loss and recollection with her daughter. She asked if I will call her husband. A call has been placed to her husband awaiting a call back. In February a referral was placed to [facility] neurology they have been calling her they state, she was given the number and  states she will call them today to set up an appointment.  Reviewing [facility] Controlled Substance reporting System she filled her June medications using a February prescription.States she found a prescription in her home that she had misplaced. A UDS was ordered. Short Term Recall was noted. Arrived hypertensive blood pressure re-checked 154/82  Pain Inventory Average Pain 8 Pain Right Now 9 My pain is intermittent, constant, sharp, stabbing and aching  In the last 24 hours, has pain interfered with the following? General activity 10 Relation with others 10 Enjoyment of life 10 What TIME of day is your pain at its worst? morning, evening, night Sleep (in general) Fair  Pain is worse with: walking, bending, sitting, inactivity, standing and some activites Pain improves with: rest, heat/ice, medication, TENS and injections Relief from Meds: 8  Mobility walk with assistance Do you have any goals in this area?  yes  Function Do you have any goals in this area?  yes  Neuro/Psych bladder control problems bowel  control problems weakness numbness tremor tingling trouble walking spasms dizziness confusion depression anxiety  Prior Studies Any changes since last visit?  no  Physicians involved in your care Any changes since last visit?  no   Family History  Problem Relation Age of Onset  . Hypertension Mother   . Cerebral aneurysm Maternal Aunt   . Early death Father 22    Raod accident  . Healthy Sister   . Healthy Brother   . Healthy Daughter   . Cerebral aneurysm Maternal Grandfather   . Cerebral aneurysm Maternal Aunt   . Healthy Sister   . Healthy Sister   . Healthy Sister   . Healthy Sister   . Healthy Brother   . Healthy Brother   . Healthy Grandchild    History   Social History  . Marital Status: Married    Spouse Name: N/A  . Number of Children: N/A  . Years of Education: 14   Occupational History  .     Social History Main Topics  . Smoking status: Never Smoker   . Smokeless tobacco: Never Used  . Alcohol Use: No     Comment: Rarely.   . Drug Use: No  . Sexual Activity: Not Currently   Other Topics Concern  . None   Social History Narrative   Patients phone number is [phone]   Past Surgical History  Procedure Laterality Date  . Temporal artery biopsy / ligation Left 6/09    Negative for temperal arteritis  . Cystoscopy  With hydrolic bladder distension  . Vaginal hysterectomy  2003    with oophorectomy, for dysfunctional uterine bleeding  . Cholecystectomy      for chronic acalculus cholecystitis  . Tubal ligation     Past Medical History  Diagnosis Date  . Essential hypertension   . Toe fracture 2001/2002    Left pinky toe  . Gastritis     Chronic, per biopsy  . Depression   . Seasonal allergic rhinitis     Spring time  . Gastroesophageal reflux disease   . Anxiety     Panic disorder  . Anemia     Anemia of chronic disease  . Fibromyalgia   . Chronic pain syndrome   . Osteopenia     DEXA 05/06/2008: L spine T -1.0,  Left femur T -0.9, Right femur T -1.1  . Inappropriate sinus tachycardia   . Refusal of blood transfusions as patient is Jehovah's Witness   . Type II diabetes mellitus 6/09  . Migraine     "q 2-3 months" (10/15/2013)  . Osteoarthritis of both knees     Tricompartmental, worse in lateral compartment  . Osteoarthritis of both shoulders     Mild  . Chronic back pain    BP 166/82 mmHg  Pulse 95  Resp 14  SpO2 97%  LMP 03/08/2002  Opioid Risk Score:   Fall Risk Score: Moderate Fall Risk (6-13 points) (pt previously educated)`1  Depression screen PHQ 2/9  Depression screen [facility] 2/9 06/25/2014 10/15/2013 10/04/2013 08/22/2013 06/11/2013 02/15/2013 01/14/2013  Decreased Interest 0 3 0 1 3 0 0  Down, Depressed, Hopeless 1 3 0 1 3 0 0  PHQ - 2 Score 1 6 0 2 6 0 0  Altered sleeping - 3 0 (No Data) 0 - -  Tired, decreased energy - 3 0 3 3 - -  Change in appetite - 3 0 1 3 - -  Feeling bad or failure about yourself  - 3 0 0 3 - -  Trouble concentrating - 0 0 0 0 - -  Moving slowly or fidgety/restless - 3 0 0 0 - -  Suicidal thoughts - 3 - 0 0 - -  PHQ-9 Score - 24 0 - 15 - -     Review of Systems  Gastrointestinal: Positive for constipation.  Genitourinary: Positive for urgency and frequency.       Incontinence  Neurological: Positive for dizziness, tremors, weakness and numbness.       Tingling Spasms   Psychiatric/Behavioral: Positive for confusion and dysphoric mood. The patient is nervous/anxious.        Objective:   Physical Exam  Constitutional: She is oriented to person, place, and time. She appears well-developed and well-nourished.  HENT:  Head: Normocephalic and atraumatic.  Neck: Normal range of motion. Neck supple.  Cardiovascular: Normal rate and regular rhythm.   Pulmonary/Chest: Effort normal and breath sounds normal.  Musculoskeletal:  Normal Muscle Bulk and Muscle Testing Reveals: Upper Extremities: Full ROM and Muscle Strength 5/5 Thoracic Paraspinal Tenderness:  T-6- T-9 Lumbar Paraspinal Tenderness: L-3- L-5 Lower Extremities: Full ROM and Muscle Strength 5/5 Right Lower Extremity Flexion Produces pain into Groin Left Lower Extremity Flexion Produces Pain into Patella and Lower Extremity Arises from chair slowly using one crutch Narrow Based Gait   Neurological: She is alert and oriented to person, place, and time.  Skin: Skin is warm and dry.  Psychiatric: She has a normal mood and affect.  Nursing note and vitals  reviewed.         Assessment & Plan:  1. Fibromyalgia syndrome: Continue with exercise, heat and ice therapy.  2. 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 Second script given 3. Myofascial shoulder pain: Continue Current Medication Regime.  4. Depression: On Effexor. Continue to monitor.  5. Memory Loss: Encouraged to F/U [facility] Neurology 6. History of insomnia. No Complaints Today. Continue to monitor.  7. Osteoarthritis of the knees: Continue Voltaren Gel.  20 minutes of face to face patient care time was spent during this visit. All questions were encouraged and answered.

## 2014-11-25 ENCOUNTER — Telehealth: Payer: Self-pay | Admitting: Registered Nurse

## 2014-11-25 NOTE — Telephone Encounter (Signed)
Placed a call to Mr. Morgan Wilkerson on behalf of Mrs. Morgan Wilkerson. She asked if I would speak to her husband. Awaiting a call back.

## 2014-11-26 LAB — PMP ALCOHOL METABOLITE (ETG): Ethyl Glucuronide (EtG): NEGATIVE ng/mL

## 2014-11-29 LAB — OPIATES/OPIOIDS (LC/MS-MS)
Codeine Urine: NEGATIVE ng/mL (ref <50)
HYDROCODONE: 2016 ng/mL (ref <50)
Hydromorphone: 520 ng/mL (ref <50)
Morphine Urine: NEGATIVE ng/mL (ref <50)
NOROXYCODONE, UR: NEGATIVE ng/mL (ref <50)
OXYMORPHONE, URINE: NEGATIVE ng/mL (ref <50)
Oxycodone, ur: NEGATIVE ng/mL (ref <50)

## 2014-11-29 LAB — METHADONE (GC/LC/MS), URINE
EDDPUC: 4527 ng/mL (ref <100)
METHADONEUC: 884 ng/mL (ref <100)

## 2014-12-01 ENCOUNTER — Ambulatory Visit (INDEPENDENT_AMBULATORY_CARE_PROVIDER_SITE_OTHER): Payer: Medicare PPO | Admitting: Internal Medicine

## 2014-12-01 ENCOUNTER — Encounter: Payer: Self-pay | Admitting: Internal Medicine

## 2014-12-01 VITALS — BP 136/80 | HR 88 | Temp 99.0°F | Ht 63.5 in | Wt 185.4 lb

## 2014-12-01 DIAGNOSIS — R21 Rash and other nonspecific skin eruption: Secondary | ICD-10-CM | POA: Diagnosis not present

## 2014-12-01 MED ORDER — DIPHENHYDRAMINE HCL 25 MG PO CAPS: 25.0000 mg | ORAL_CAPSULE | Freq: Two times a day (BID) | ORAL | Status: DC | PRN

## 2014-12-01 NOTE — Patient Instructions (Signed)
1. I am prescribing Benadryl to help with the itchy rash.  It looks like it is getting better.  Be sure to use mild soaps (sensitive skin) and keep your skin moisturized with mild lotions (fragrance-free) to avoid more irritation.  Wear cool, loose fitting clothing and keep cool, especially in this warm weather.     2. Please take all medications as prescribed.    3. If you have worsening of your symptoms or new symptoms arise, please call the clinic ([phone]), or go to the ER immediately if symptoms are severe.

## 2014-12-01 NOTE — Progress Notes (Signed)
Subjective:    Patient ID: Morgan Wilkerson, female    DOB: 04/18/1956, 59 y.o.   MRN: [id number]  HPI Comments: Morgan Wilkerson is a 59 year old woman with PMH as below here with c/o rash and not feeling well since insect bites two weeks ago.  Please see problem based charting for assessment and plan.     Past Medical History  Diagnosis Date  . Essential hypertension   . Toe fracture 2001/2002    Left pinky toe  . Gastritis     Chronic, per biopsy  . Depression   . Seasonal allergic rhinitis     Spring time  . Gastroesophageal reflux disease   . Anxiety     Panic disorder  . Anemia     Anemia of chronic disease  . Fibromyalgia   . Chronic pain syndrome   . Osteopenia     DEXA 05/06/2008: L spine T -1.0, Left femur T -0.9, Right femur T -1.1  . Inappropriate sinus tachycardia   . Refusal of blood transfusions as patient is Jehovah's Witness   . Type II diabetes mellitus 6/09  . Migraine     "q 2-3 months" (10/15/2013)  . Osteoarthritis of both knees     Tricompartmental, worse in lateral compartment  . Osteoarthritis of both shoulders     Mild  . Chronic back pain    Current Outpatient Prescriptions on File Prior to Visit  Medication Sig Dispense Refill  . ACCU-CHEK FASTCLIX LANCETS MISC 1 each by Does not apply route daily. Check blood sugar once per day. 102 each 12  . baclofen (LIORESAL) 10 MG tablet Take 1 tablet (10 mg total) by mouth 2 (two) times daily. 60 tablet 2  . Blood Glucose Monitoring Suppl (ACCU-CHEK NANO SMARTVIEW) W/DEVICE KIT 1 kit by Does not apply route daily. Check blood sugar once a day. 1 kit 0  . diclofenac sodium (VOLTAREN) 1 % GEL Apply 2 g topically 4 (four) times daily. To your knees 3 Tube 4  . gabapentin (NEURONTIN) 100 MG capsule Take 1 capsule (100 mg total) by mouth 3 (three) times daily. 90 capsule 3  . glucose blood (ACCU-CHEK SMARTVIEW) test strip Check blood sugar once a day. 50 each 12  . HYDROcodone-acetaminophen (NORCO) 10-325 MG per  tablet Take 1 tablet by mouth every 6 (six) hours as needed. For pain 120 tablet 0  . methadone (DOLOPHINE) 10 MG tablet Take 2 tablets (20 mg total) by mouth every 12 (twelve) hours. 120 tablet 0  . metoprolol tartrate (LOPRESSOR) 25 MG tablet TAKE ONE-HALF TABLET BY MOUTH TWICE DAILY 60 tablet 6  . venlafaxine (EFFEXOR) 75 MG tablet TAKE THREE TABLETS BY MOUTH IN THE MORNING THEN TAKE TWO TABLETS BY MOUTH AT NIGHT 150 tablet 3   No current facility-administered medications on file prior to visit.    Review of Systems  Constitutional: Positive for chills and appetite change. Negative for fever.  HENT: Positive for congestion, postnasal drip and sore throat. Negative for rhinorrhea and sneezing.   Eyes: Negative for visual disturbance.  Respiratory: Negative for cough and shortness of breath.        Orthopnea x 2 weeks, no DOE  Gastrointestinal: Positive for nausea, vomiting and abdominal pain. Negative for diarrhea, constipation and blood in stool.       3-4 x vomited last week but since resolved. Chronic abdominal pain since 2003 due to "muscle tears" + flatus  Musculoskeletal: Positive for myalgias. Negative for neck pain  and neck stiffness.       Aches all over x 2weeks  Skin: Positive for rash.       S[place]  Rash improving.  She has been spraying insect repellant on her skin.  Neurological: Negative for headaches.       Filed Vitals:   12/01/14 1355  BP: 136/80  Pulse: 88  Temp: 99 F (37.2 C)  TempSrc: Oral  Height: 5' 3.5" (1.613 m)  Weight: 185 lb 6.4 oz (84.097 kg)  SpO2: 100%     Objective:   Physical Exam  Constitutional: She is oriented to person, place, and time. She appears well-developed. No distress.  HENT:  Head: Normocephalic and atraumatic.  Mouth/Throat: Oropharynx is clear and moist. No oropharyngeal exudate.  Eyes: Conjunctivae and EOM are normal. Pupils are equal, round, and reactive to  light. Right eye exhibits no discharge. Left eye exhibits no discharge.  Neck: Normal range of motion. Neck supple.  Cardiovascular: Normal rate, regular rhythm and normal heart sounds.  Exam reveals no gallop and no friction rub.   No murmur heard. Pulmonary/Chest: Effort normal and breath sounds normal. No respiratory distress. She has no wheezes. She has no rales.  Abdominal: Soft. Bowel sounds are normal. She exhibits no distension and no mass. There is tenderness. There is no rebound and no guarding.  Musculoskeletal: Normal range of motion. She exhibits no edema or tenderness.  Lymphadenopathy:    She has no cervical adenopathy.  Neurological: She is alert and oriented to person, place, and time. No cranial nerve deficit.  Skin: Skin is warm and dry. Rash noted. She is not diaphoretic. No erythema. No pallor.  Skin of chest and back are dry and there are small hyperpigmented papules and scratch marks.  There is no erythema, petechia, purpura.  Psychiatric: She has a normal mood and affect. Her behavior is normal.  Vitals reviewed.         Assessment & Plan:  Please see problem based charting for assessment and plan.

## 2014-12-02 ENCOUNTER — Telehealth: Payer: Self-pay | Admitting: Internal Medicine

## 2014-12-02 DIAGNOSIS — R21 Rash and other nonspecific skin eruption: Secondary | ICD-10-CM | POA: Insufficient documentation

## 2014-12-02 LAB — PRESCRIPTION MONITORING PROFILE (SOLSTAS)
Amphetamine/Meth: NEGATIVE ng/mL
BARBITURATE SCREEN, URINE: NEGATIVE ng/mL
Benzodiazepine Screen, Urine: NEGATIVE ng/mL
Buprenorphine, Urine: NEGATIVE ng/mL
COCAINE METABOLITES: NEGATIVE ng/mL
CREATININE, URINE: 299.07 mg/dL (ref >20.0)
Cannabinoid Scrn, Ur: NEGATIVE ng/mL
Carisoprodol, Urine: NEGATIVE ng/mL
Fentanyl, Ur: NEGATIVE ng/mL
MDMA URINE: NEGATIVE ng/mL
Meperidine, Ur: NEGATIVE ng/mL
Nitrites, Initial: NEGATIVE ug/mL
Oxycodone Screen, Ur: NEGATIVE ng/mL
Propoxyphene: NEGATIVE ng/mL
Tapentadol, urine: NEGATIVE ng/mL
Tramadol Scrn, Ur: NEGATIVE ng/mL
Zolpidem, Urine: NEGATIVE ng/mL
pH, Initial: 7.9 pH (ref 4.5–8.9)

## 2014-12-02 MED ORDER — DOXYCYCLINE HYCLATE 100 MG PO CAPS: 100.0000 mg | ORAL_CAPSULE | Freq: Two times a day (BID) | ORAL | Status: DC

## 2014-12-02 NOTE — Telephone Encounter (Signed)
Attempted to call patient to ask her to pick up doxycycline rx at her pharmacy.  No answer on her cell phone and voicemail not set up.  Voicemail left on her husband's cell phone asking her to call clinic.

## 2014-12-02 NOTE — Assessment & Plan Note (Addendum)
Pruritic rash, sore throat and malaise since insect bites two weeks ago.  Rash on chest and back.  There are tiny dry papules and excoriations where she has been scratching.  No new detergents or soaps.  No one else in the home is affected.  She thinks rash is improving.  She says she saw tiny insects biting her and she thinks they were mosquitoes.  She has been spraying insect repellant on the rash.  No petechial rash, fever, neck pain, nuchal rigidity or photophobia to suggest meningitis.  No fever or headache and itchy rash is unusual of RMSF.  However, there is insect bite story (? Mosquitoes but unclear what actually bit her) and given overall malaise, vomiting hx, sore throat, rash will treat for potential RMSF.  - advised mild soaps and lotions (dry skin may be making this itching worse) - benadryl q12h prn for itching - doxycycline 100mg  BID x 7 days - RTC in 1 week

## 2014-12-04 ENCOUNTER — Telehealth: Payer: Self-pay | Admitting: Internal Medicine

## 2014-12-04 NOTE — Telephone Encounter (Signed)
Morgan Wilkerson returned my call.  I informed her of doxy rx at her pharmacy.  She will pick it up and start taking it today BID x 7 days.  She will return to clinic if symptoms no better after abx treatment.

## 2014-12-05 NOTE — Progress Notes (Signed)
Internal Medicine Clinic Attending  Case discussed with Dr. Wilson soon after the resident saw the patient.  We reviewed the resident's history and exam and pertinent patient test results.  I agree with the assessment, diagnosis, and plan of care documented in the resident's note.  

## 2014-12-05 NOTE — Progress Notes (Signed)
Urine drug screen for this encounter is consistent for prescribed medication 

## 2015-01-01 ENCOUNTER — Other Ambulatory Visit: Payer: Self-pay | Admitting: Registered Nurse

## 2015-01-02 ENCOUNTER — Other Ambulatory Visit: Payer: Self-pay | Admitting: Internal Medicine

## 2015-01-14 ENCOUNTER — Encounter: Payer: Medicare PPO | Attending: Registered Nurse | Admitting: Physical Medicine & Rehabilitation

## 2015-01-14 ENCOUNTER — Encounter: Payer: Self-pay | Admitting: Physical Medicine & Rehabilitation

## 2015-01-14 VITALS — BP 152/83 | HR 87

## 2015-01-14 DIAGNOSIS — M12571 Traumatic arthropathy, right ankle and foot: Secondary | ICD-10-CM

## 2015-01-14 DIAGNOSIS — M170 Bilateral primary osteoarthritis of knee: Secondary | ICD-10-CM | POA: Diagnosis not present

## 2015-01-14 DIAGNOSIS — M79604 Pain in right leg: Secondary | ICD-10-CM | POA: Insufficient documentation

## 2015-01-14 DIAGNOSIS — M79605 Pain in left leg: Secondary | ICD-10-CM | POA: Diagnosis not present

## 2015-01-14 DIAGNOSIS — Z760 Encounter for issue of repeat prescription: Secondary | ICD-10-CM | POA: Diagnosis not present

## 2015-01-14 DIAGNOSIS — G8929 Other chronic pain: Secondary | ICD-10-CM | POA: Diagnosis not present

## 2015-01-14 DIAGNOSIS — M545 Low back pain: Secondary | ICD-10-CM | POA: Diagnosis not present

## 2015-01-14 DIAGNOSIS — M797 Fibromyalgia: Secondary | ICD-10-CM

## 2015-01-14 DIAGNOSIS — M19011 Primary osteoarthritis, right shoulder: Secondary | ICD-10-CM

## 2015-01-14 DIAGNOSIS — M19171 Post-traumatic osteoarthritis, right ankle and foot: Secondary | ICD-10-CM | POA: Insufficient documentation

## 2015-01-14 DIAGNOSIS — M25552 Pain in left hip: Secondary | ICD-10-CM | POA: Diagnosis not present

## 2015-01-14 DIAGNOSIS — M25551 Pain in right hip: Secondary | ICD-10-CM | POA: Insufficient documentation

## 2015-01-14 DIAGNOSIS — M19012 Primary osteoarthritis, left shoulder: Secondary | ICD-10-CM

## 2015-01-14 DIAGNOSIS — M542 Cervicalgia: Secondary | ICD-10-CM | POA: Insufficient documentation
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
## 2015-01-14 NOTE — Patient Instructions (Signed)
METHADONE INSTRUCTIONS:   20MG  IN AM AND 15MG  (1 1/2 TABS) IN PM FOR 2 WEEKS  THEN 20MG  IN AND 10MG  IN PM FOR 2 WEEKS  HOLD AT THAT LEVEL UNTIL FOLLOW UP IN THIS OFFICE.

## 2015-02-09 ENCOUNTER — Encounter: Payer: Self-pay | Admitting: Registered Nurse

## 2015-02-09 ENCOUNTER — Encounter: Payer: Medicare PPO | Attending: Registered Nurse | Admitting: Registered Nurse

## 2015-02-09 ENCOUNTER — Telehealth: Payer: Self-pay | Admitting: Registered Nurse

## 2015-02-09 VITALS — BP 147/86 | HR 71

## 2015-02-09 DIAGNOSIS — M25551 Pain in right hip: Secondary | ICD-10-CM | POA: Insufficient documentation

## 2015-02-09 DIAGNOSIS — M542 Cervicalgia: Secondary | ICD-10-CM | POA: Insufficient documentation

## 2015-02-09 DIAGNOSIS — M545 Low back pain: Secondary | ICD-10-CM | POA: Insufficient documentation

## 2015-02-09 DIAGNOSIS — G894 Chronic pain syndrome: Secondary | ICD-10-CM | POA: Diagnosis not present

## 2015-02-09 DIAGNOSIS — M797 Fibromyalgia: Secondary | ICD-10-CM | POA: Diagnosis not present

## 2015-02-09 DIAGNOSIS — M79604 Pain in right leg: Secondary | ICD-10-CM | POA: Diagnosis not present

## 2015-02-09 DIAGNOSIS — G8929 Other chronic pain: Secondary | ICD-10-CM | POA: Diagnosis not present

## 2015-02-09 DIAGNOSIS — Z760 Encounter for issue of repeat prescription: Secondary | ICD-10-CM | POA: Diagnosis not present

## 2015-02-09 DIAGNOSIS — M25552 Pain in left hip: Secondary | ICD-10-CM | POA: Insufficient documentation

## 2015-02-09 DIAGNOSIS — M79605 Pain in left leg: Secondary | ICD-10-CM | POA: Diagnosis not present

## 2015-02-09 MED ORDER — HYDROCODONE-ACETAMINOPHEN 10-325 MG PO TABS: 1.0000 | ORAL_TABLET | Freq: Four times a day (QID) | ORAL | Status: DC | PRN

## 2015-02-09 MED ORDER — METHADONE HCL 10 MG PO TABS: ORAL_TABLET | ORAL | Status: DC

## 2015-02-09 NOTE — Patient Instructions (Addendum)
METHADONE INSTRUCTIONS:  02/09/2015  20MG  IN AM (  Two Tablets in the Morning)   AND 15MG  (1 1/2 TABS) IN PM FOR 2 WEEKS  On 10/17/ 2016  THEN 20MG  IN the Morning Take Two Tablets in the Morning   AND 10MG  IN PM ( One Tablet)  HOLD AT THAT LEVEL UNTIL FOLLOW UP IN THIS OFFICE.

## 2015-02-09 NOTE — Progress Notes (Signed)
Subjective:    Patient ID: Morgan Wilkerson, female    DOB: [birth year], 59 y.o.   MRN: [id number]  HPI: Morgan Wilkerson is a 59 year old female who returns for follow up for chronic pain and medication refill. She says her pain is located in her right shoulder, lower back, and right lower extremity. She rates her pain 9. Her current exercise regime is performing stretching exercises using bands and walking. [name] can't find her Hydrocodone in her purse, I called her daughter Morgan Wilkerson she states the hydrocodone bottle is not in her car. I spoke to [facility] in detail regarding her mother's medication and were in the process of weaning the methadone. Dr. Naaman Plummer order this last month and she didn't follow the orders. I asked if she and Morgan Wilkerson ( her husband) to assist Morgan Wilkerson with medication she verbalizes understanding. Also educated on the narcotic policy and her medications need to be correct or she may be discharge from the office she verbalizes understanding. Also reiterated she must bring her analgesic medications next month. She was given a warning letter she verbalizes understanding. Dr. Naaman Plummer aware of the above. I spoke to her daughter Morgan Wilkerson) on the phone several times throughout her visit. Morgan Wilkerson is aware of the above and verbalizes understanding.  Pain Inventory Average Pain 8 Pain Right Now 9 My pain is sharp, stabbing, tingling and aching  In the last 24 hours, has pain interfered with the following? General activity 10 Relation with others 10 Enjoyment of life 10 What TIME of day is your pain at its worst? all Sleep (in general) NA  Pain is worse with: na Pain improves with: rest and heat/ice Relief from Meds: 5  Mobility use a cane how many minutes can you walk? 25-30 ability to climb steps?  no do you drive?  no  Function disabled: date disabled 2003 I need assistance with the following:  dressing, bathing, meal prep, household duties and  shopping  Neuro/Psych bladder control problems bowel control problems weakness tremor tingling trouble walking spasms dizziness confusion depression anxiety loss of taste or smell suicidal thoughts  Prior Studies Any changes since last visit?  no  Physicians involved in your care Any changes since last visit?  no   Family History  Problem Relation Age of Onset  . Hypertension Mother   . Cerebral aneurysm Maternal Aunt   . Early death Father 27    Raod accident  . Healthy Sister   . Healthy Brother   . Healthy Daughter   . Cerebral aneurysm Maternal Grandfather   . Cerebral aneurysm Maternal Aunt   . Healthy Sister   . Healthy Sister   . Healthy Sister   . Healthy Sister   . Healthy Brother   . Healthy Brother   . Healthy Grandchild    Social History   Social History  . Marital Status: Married    Spouse Name: N/A  . Number of Children: N/A  . Years of Education: 14   Occupational History  .     Social History Main Topics  . Smoking status: Never Smoker   . Smokeless tobacco: Never Used  . Alcohol Use: No     Comment: Rarely.   . Drug Use: No  . Sexual Activity: Not Currently   Other Topics Concern  . None   Social History Narrative   Patients phone number is [phone]   Past Surgical History  Procedure Laterality Date  . Temporal  artery biopsy / ligation Left 6/09    Negative for temperal arteritis  . Cystoscopy      With hydrolic bladder distension  . Vaginal hysterectomy  2003    with oophorectomy, for dysfunctional uterine bleeding  . Cholecystectomy      for chronic acalculus cholecystitis  . Tubal ligation     Past Medical History  Diagnosis Date  . Essential hypertension   . Toe fracture 2001/2002    Left pinky toe  . Gastritis     Chronic, per biopsy  . Depression   . Seasonal allergic rhinitis     Spring time  . Gastroesophageal reflux disease   . Anxiety     Panic disorder  . Anemia     Anemia of chronic disease   . Fibromyalgia   . Chronic pain syndrome   . Osteopenia     DEXA 05/06/2008: L spine T -1.0, Left femur T -0.9, Right femur T -1.1  . Inappropriate sinus tachycardia ([place])   . Refusal of blood transfusions as patient is Jehovah's Witness   . Type II diabetes mellitus ([place]) 6/09  . Migraine     "q 2-3 months" (10/15/2013)  . Osteoarthritis of both knees     Tricompartmental, worse in lateral compartment  . Osteoarthritis of both shoulders     Mild  . Chronic back pain    BP 147/86 mmHg  Pulse 71  SpO2 97%  LMP 03/08/2002  Opioid Risk Score:   Fall Risk Score:  `1  Depression screen PHQ 2/9  Depression screen [facility] 2/9 01/14/2015 12/01/2014 06/25/2014 10/15/2013 10/04/2013 08/22/2013 06/11/2013  Decreased Interest 3 0 0 3 0 1 3  Down, Depressed, Hopeless 3 3 1 3  0 1 3  PHQ - 2 Score 6 3 1 6  0 2 6  Altered sleeping - 3 - 3 0 (No Data) 0  Tired, decreased energy - 3 - 3 0 3 3  Change in appetite - 3 - 3 0 1 3  Feeling bad or failure about yourself  - 3 - 3 0 0 3  Trouble concentrating - 3 - 0 0 0 0  Moving slowly or fidgety/restless - 3 - 3 0 0 0  Suicidal thoughts - 0 - 3 - 0 0  PHQ-9 Score - 21 - 24 0 - 15      Review of Systems  Constitutional: Positive for chills, diaphoresis, appetite change and unexpected weight change.  Gastrointestinal: Positive for abdominal pain and constipation.       Bowel Control Problems  Endocrine:       High blood sugars  Genitourinary:       Bladder control problems  Musculoskeletal: Positive for gait problem.       Spasms  Skin: Positive for rash.  Neurological: Positive for dizziness, tremors and weakness.       Tingling  Psychiatric/Behavioral: Positive for suicidal ideas, confusion and dysphoric mood. The patient is nervous/anxious.   All other systems reviewed and are negative.      Objective:   Physical Exam  Constitutional: She is oriented to person, place, and time. She appears well-developed and well-nourished.  HENT:  Head:  Normocephalic and atraumatic.  Neck: Normal range of motion. Neck supple.  Cardiovascular: Normal rate and regular rhythm.   Pulmonary/Chest: Effort normal and breath sounds normal.  Musculoskeletal:  Normal Muscle Bulk and Muscle Testing Reveals: Upper Extremities: Full ROM and Muscle Strength 5/5 Lumbar Paraspinal Tenderness: L-4-L-5 Lower Extremities: Full ROM and Muscle  Strength 5/5 Bilateral Lower Extremity Flexion Produces Pain into Patella's Right Lower extremity Flexion Produces Pain into lower Extremity Arises from chair slowly using crutch Antalgic Gait   Neurological: She is alert and oriented to person, place, and time.  Skin: Skin is warm and dry.  Psychiatric: She has a normal mood and affect.  Nursing note and vitals reviewed.         Assessment & Plan:  1. Fibromyalgia syndrome: Continue with exercise, heat and ice therapy.  2. 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 On 02/23/2015 20 mg in the morning and 10 mg in the evening. #90 3. Myofascial shoulder pain: Continue Current Medication Regime.  4. Depression: On Effexor. Continue to monitor.  5. Memory Loss: Encouraged to F/U [facility] 6. History of insomnia. No Complaints Today. Continue to monitor.  7. Osteoarthritis of the knees: Continue Voltaren Gel.  40 minutes of face to face patient care time was spent during this visit. All questions were encouraged and answered.

## 2015-02-26 ENCOUNTER — Other Ambulatory Visit: Payer: Self-pay | Admitting: Internal Medicine

## 2015-03-10 ENCOUNTER — Encounter: Payer: Medicare PPO | Admitting: Registered Nurse

## 2015-03-24 ENCOUNTER — Encounter: Payer: Medicare PPO | Admitting: Registered Nurse

## 2015-03-24 ENCOUNTER — Telehealth: Payer: Self-pay | Admitting: Registered Nurse

## 2015-03-24 NOTE — Telephone Encounter (Signed)
Spoke with Mr. Soraiya Schoenbachler husband of Morgan Wilkerson he will bring her tomorrow 03/25/2015 at 3:30. He states she has enough medication. According to [facility] her medications Hydrocodone and Methadone was picked up on 02/26/15.

## 2015-03-25 ENCOUNTER — Encounter: Payer: Self-pay | Admitting: Registered Nurse

## 2015-03-25 ENCOUNTER — Encounter: Payer: Medicare PPO | Attending: Registered Nurse | Admitting: Registered Nurse

## 2015-03-25 VITALS — BP 158/82 | HR 98

## 2015-03-25 DIAGNOSIS — M79605 Pain in left leg: Secondary | ICD-10-CM | POA: Diagnosis not present

## 2015-03-25 DIAGNOSIS — M542 Cervicalgia: Secondary | ICD-10-CM | POA: Insufficient documentation

## 2015-03-25 DIAGNOSIS — G8929 Other chronic pain: Secondary | ICD-10-CM | POA: Diagnosis present

## 2015-03-25 DIAGNOSIS — Z760 Encounter for issue of repeat prescription: Secondary | ICD-10-CM | POA: Diagnosis not present

## 2015-03-25 DIAGNOSIS — M25551 Pain in right hip: Secondary | ICD-10-CM | POA: Insufficient documentation

## 2015-03-25 DIAGNOSIS — G894 Chronic pain syndrome: Secondary | ICD-10-CM | POA: Diagnosis not present

## 2015-03-25 DIAGNOSIS — M25552 Pain in left hip: Secondary | ICD-10-CM | POA: Diagnosis not present

## 2015-03-25 DIAGNOSIS — M79604 Pain in right leg: Secondary | ICD-10-CM | POA: Insufficient documentation

## 2015-03-25 DIAGNOSIS — M797 Fibromyalgia: Secondary | ICD-10-CM

## 2015-03-25 DIAGNOSIS — M545 Low back pain: Secondary | ICD-10-CM | POA: Diagnosis not present

## 2015-03-25 MED ORDER — METHADONE HCL 10 MG PO TABS: ORAL_TABLET | ORAL | Status: DC

## 2015-03-25 MED ORDER — HYDROCODONE-ACETAMINOPHEN 10-325 MG PO TABS: 1.0000 | ORAL_TABLET | Freq: Four times a day (QID) | ORAL | Status: DC | PRN

## 2015-03-25 NOTE — Patient Instructions (Signed)
Take one and half tablets of Methadone  in the Morning  And one tablet in the evening  Call office with any questions  [phone]

## 2015-03-26 NOTE — Progress Notes (Signed)
Subjective:    Patient ID: Morgan Wilkerson, female    DOB: 1955/07/29, 59 y.o.   MRN: [id number]  HPI: Morgan Wilkerson is a 59 year old female who returns for follow up for chronic pain and medication refill. She says her pain is located in her lower back, bilateral  Knees, bilateral lower extremities and left foot. She did not rate her pain. Her current exercise regime is performing stretching exercises using bands and walking. Morgan Wilkerson brought in her September Hydrocodone bottle she wanted to prove she found it due to last month encounter.  We discussed weaning Methadone as Dr. Naaman Plummer instructed she will be decrease to Methadone 15 mg in the Morning and 10 mg in the Evening she agrees. Also instructed to call office with any questions or concerns, she verbalizes understanding.    Pain Inventory Average Pain NA Pain Right Now NA My pain is NA  In the last 24 hours, has pain interfered with the following? General activity NA Relation with others NA Enjoyment of life NA What TIME of day is your pain at its worst? NA Sleep (in general) NA  Pain is worse with: NA Pain improves with: NA Relief from Meds: NA  Mobility walk with assistance how many minutes can you walk? 25 ability to climb steps?  no do you drive?  no Do you have any goals in this area?  yes  Function disabled: date disabled NA I need assistance with the following:  dressing, bathing, meal prep, household duties and shopping Do you have any goals in this area?  yes  Neuro/Psych bladder control problems bowel control problems weakness numbness tremor tingling trouble walking spasms dizziness confusion  Prior Studies Any changes since last visit?  no  Physicians involved in your care Any changes since last visit?  no   Family History  Problem Relation Age of Onset  . Hypertension Mother   . Cerebral aneurysm Maternal Aunt   . Early death Father 63    Raod accident  . Healthy Sister   . Healthy  Brother   . Healthy Daughter   . Cerebral aneurysm Maternal Grandfather   . Cerebral aneurysm Maternal Aunt   . Healthy Sister   . Healthy Sister   . Healthy Sister   . Healthy Sister   . Healthy Brother   . Healthy Brother   . Healthy Grandchild    Social History   Social History  . Marital Status: Married    Spouse Name: N/A  . Number of Children: N/A  . Years of Education: 14   Occupational History  .     Social History Main Topics  . Smoking status: Never Smoker   . Smokeless tobacco: Never Used  . Alcohol Use: No     Comment: Rarely.   . Drug Use: No  . Sexual Activity: Not Currently   Other Topics Concern  . None   Social History Narrative   Patients phone number is [phone]   Past Surgical History  Procedure Laterality Date  . Temporal artery biopsy / ligation Left 6/09    Negative for temperal arteritis  . Cystoscopy      With hydrolic bladder distension  . Vaginal hysterectomy  2003    with oophorectomy, for dysfunctional uterine bleeding  . Cholecystectomy      for chronic acalculus cholecystitis  . Tubal ligation     Past Medical History  Diagnosis Date  . Essential hypertension   . Toe  fracture 2001/2002    Left pinky toe  . Gastritis     Chronic, per biopsy  . Depression   . Seasonal allergic rhinitis     Spring time  . Gastroesophageal reflux disease   . Anxiety     Panic disorder  . Anemia     Anemia of chronic disease  . Fibromyalgia   . Chronic pain syndrome   . Osteopenia     DEXA 05/06/2008: L spine T -1.0, Left femur T -0.9, Right femur T -1.1  . Inappropriate sinus tachycardia (Morgan Wilkerson)   . Refusal of blood transfusions as patient is Jehovah's Witness   . Type II diabetes mellitus (Morgan Wilkerson) 6/09  . Migraine     "q 2-3 months" (10/15/2013)  . Osteoarthritis of both knees     Tricompartmental, worse in lateral compartment  . Osteoarthritis of both shoulders     Mild  . Chronic back pain    BP 158/82 mmHg  Pulse 98  SpO2  100%  LMP 03/08/2002  Opioid Risk Score:   Fall Risk Score:  `1  Depression screen PHQ 2/9  Depression screen [facility] 2/9 01/14/2015 12/01/2014 06/25/2014 10/15/2013 10/04/2013 08/22/2013 06/11/2013  Decreased Interest 3 0 0 3 0 1 3  Down, Depressed, Hopeless 3 3 1 3  0 1 3  PHQ - 2 Score 6 3 1 6  0 2 6  Altered sleeping - 3 - 3 0 (No Data) 0  Tired, decreased energy - 3 - 3 0 3 3  Change in appetite - 3 - 3 0 1 3  Feeling bad or failure about yourself  - 3 - 3 0 0 3  Trouble concentrating - 3 - 0 0 0 0  Moving slowly or fidgety/restless - 3 - 3 0 0 0  Suicidal thoughts - 0 - 3 - 0 0  PHQ-9 Score - 21 - 24 0 - 15      Review of Systems  Gastrointestinal:       Bowel Control Problems  Genitourinary:       Bladder Control Problems  Musculoskeletal:       Spasms  Neurological: Positive for dizziness, tremors, weakness and numbness.       Tingling  Gait Instabiltity  Psychiatric/Behavioral: Positive for confusion. The patient is nervous/anxious.        Depression  All other systems reviewed and are negative.      Objective:   Physical Exam  Constitutional: She is oriented to person, place, and time. She appears well-developed and well-nourished.  HENT:  Head: Normocephalic and atraumatic.  Neck: Normal range of motion. Neck supple.  Cardiovascular: Normal rate and regular rhythm.   Pulmonary/Chest: Effort normal and breath sounds normal.  Musculoskeletal:  Normal Muscle Bulk and Muscle Testing Reveals: Upper Extremities: Full ROM and Muscle Strength 5/5 Thoracic and Lumbar Hypersensitivity Lower Extremities: Right: Full ROM and Muscle Strength 5/5 Left: Decreased ROM and Muscle Strength 5/5 Left Lower Extremity Flexion Produces Pain into Lower Extremity. Arises from chair slowly using [place] Crutch Antalgic gait  Neurological: She is alert and oriented to person, place, and time.  Skin: Skin is warm and dry.  Psychiatric: She has a normal mood and affect.  Nursing note and  vitals reviewed.         Assessment & Plan:  1. Fibromyalgia syndrome: Continue with exercise, heat and ice therapy.  2. 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 3. Myofascial shoulder pain: Continue Current Medication Regime.  4. Depression: On Effexor. Continue to monitor.  5. History of insomnia. No Complaints Today. Continue to monitor.  6. Osteoarthritis of the knees: Continue Voltaren Gel.  20 minutes of face to face patient care time was spent during this visit. All questions were encouraged and answered.

## 2015-03-31 ENCOUNTER — Encounter: Payer: Self-pay | Admitting: Student

## 2015-04-08 ENCOUNTER — Encounter: Payer: Self-pay | Admitting: Internal Medicine

## 2015-04-14 ENCOUNTER — Other Ambulatory Visit: Payer: Self-pay | Admitting: Internal Medicine

## 2015-04-20 ENCOUNTER — Encounter: Payer: Medicare PPO | Attending: Registered Nurse | Admitting: Physical Medicine & Rehabilitation

## 2015-04-20 ENCOUNTER — Encounter: Payer: Self-pay | Admitting: Physical Medicine & Rehabilitation

## 2015-04-20 VITALS — BP 158/96 | HR 103 | Resp 14

## 2015-04-20 DIAGNOSIS — M542 Cervicalgia: Secondary | ICD-10-CM | POA: Diagnosis not present

## 2015-04-20 DIAGNOSIS — G894 Chronic pain syndrome: Secondary | ICD-10-CM

## 2015-04-20 DIAGNOSIS — Z760 Encounter for issue of repeat prescription: Secondary | ICD-10-CM | POA: Insufficient documentation

## 2015-04-20 DIAGNOSIS — G8929 Other chronic pain: Secondary | ICD-10-CM | POA: Diagnosis not present

## 2015-04-20 DIAGNOSIS — M79604 Pain in right leg: Secondary | ICD-10-CM | POA: Insufficient documentation

## 2015-04-20 DIAGNOSIS — M797 Fibromyalgia: Secondary | ICD-10-CM

## 2015-04-20 DIAGNOSIS — M25551 Pain in right hip: Secondary | ICD-10-CM | POA: Diagnosis not present

## 2015-04-20 DIAGNOSIS — M79605 Pain in left leg: Secondary | ICD-10-CM | POA: Insufficient documentation

## 2015-04-20 DIAGNOSIS — M25552 Pain in left hip: Secondary | ICD-10-CM | POA: Diagnosis not present

## 2015-04-20 DIAGNOSIS — M545 Low back pain: Secondary | ICD-10-CM | POA: Diagnosis not present

## 2015-04-20 MED ORDER — HYDROCODONE-ACETAMINOPHEN 10-325 MG PO TABS: 1.0000 | ORAL_TABLET | Freq: Four times a day (QID) | ORAL | Status: DC | PRN

## 2015-04-20 MED ORDER — METHADONE HCL 10 MG PO TABS: ORAL_TABLET | ORAL | Status: DC

## 2015-04-20 NOTE — Patient Instructions (Signed)
TRY ACID BLOCKER FOR YOUR ACID REFLUX   WE CAN CONSIDER INJECTIONS TO YOUR KNEES IF YOU WISH (STEROID AND/OR SYNVISC)  TRY ORAL GLUCOSAMINE WITH CHONDROITIN TO HELP YOUR KNEE PAIN.

## 2015-04-20 NOTE — Progress Notes (Signed)
Subjective:    Patient ID: Morgan Wilkerson, female    DOB: September 05, 1955, 59 y.o.   MRN: [id number]  HPI  Morgan Wilkerson is here in follow up of her chronic pain. I last saw her September. She states that she has been feeling "awful"----she's now "allergic" to milk.  She has been coughing more as a result. She is taking a cough remedy which has seemed to help.   She has fallen three x since I last saw her. She fell once at home when she tripped. She is not using her crutches consistently and does not use her walker (doesn't like how it looks). She will use a w/c for longer dx.  We have been weaning her methadone slowly and she's been doing well with that.   Pain Inventory Average Pain 9 Pain Right Now 9 My pain is intermittent, constant, sharp, stabbing and aching  In the last 24 hours, has pain interfered with the following? General activity 10 Relation with others 10 Enjoyment of life 10 What TIME of day is your pain at its worst? all Sleep (in general) Fair  Pain is worse with: walking, bending, sitting, inactivity, standing and some activites Pain improves with: rest, medication and injections Relief from Meds: 5  Mobility walk without assistance walk with assistance ability to climb steps?  no do you drive?  no  Function I need assistance with the following:  dressing, bathing, meal prep, household duties and shopping Do you have any goals in this area?  yes  Neuro/Psych bladder control problems bowel control problems weakness tremor spasms dizziness confusion depression anxiety loss of taste or smell  Prior Studies Any changes since last visit?  no  Physicians involved in your care Any changes since last visit?  no   Family History  Problem Relation Age of Onset  . Hypertension Mother   . Cerebral aneurysm Maternal Aunt   . Early death Father 57    Raod accident  . Healthy Sister   . Healthy Brother   . Healthy Daughter   . Cerebral aneurysm Maternal  Grandfather   . Cerebral aneurysm Maternal Aunt   . Healthy Sister   . Healthy Sister   . Healthy Sister   . Healthy Sister   . Healthy Brother   . Healthy Brother   . Healthy Grandchild    Social History   Social History  . Marital Status: Married    Spouse Name: N/A  . Number of Children: N/A  . Years of Education: 14   Occupational History  .     Social History Main Topics  . Smoking status: Never Smoker   . Smokeless tobacco: Never Used  . Alcohol Use: No     Comment: Rarely.   . Drug Use: No  . Sexual Activity: Not Currently   Other Topics Concern  . None   Social History Narrative   Patients phone number is [phone]   Past Surgical History  Procedure Laterality Date  . Temporal artery biopsy / ligation Left 6/09    Negative for temperal arteritis  . Cystoscopy      With hydrolic bladder distension  . Vaginal hysterectomy  2003    with oophorectomy, for dysfunctional uterine bleeding  . Cholecystectomy      for chronic acalculus cholecystitis  . Tubal ligation     Past Medical History  Diagnosis Date  . Essential hypertension   . Toe fracture 2001/2002    Left pinky toe  . Gastritis  Chronic, per biopsy  . Depression   . Seasonal allergic rhinitis     Spring time  . Gastroesophageal reflux disease   . Anxiety     Panic disorder  . Anemia     Anemia of chronic disease  . Fibromyalgia   . Chronic pain syndrome   . Osteopenia     DEXA 05/06/2008: L spine T -1.0, Left femur T -0.9, Right femur T -1.1  . Inappropriate sinus tachycardia ([place])   . Refusal of blood transfusions as patient is Jehovah's Witness   . Type II diabetes mellitus ([place]) 6/09  . Migraine     "q 2-3 months" (10/15/2013)  . Osteoarthritis of both knees     Tricompartmental, worse in lateral compartment  . Osteoarthritis of both shoulders     Mild  . Chronic back pain    BP 158/96 mmHg  Pulse 103  Resp 14  SpO2 97%  LMP 03/08/2002  Opioid Risk Score:   Fall  Risk Score:  `1  Depression screen PHQ 2/9  Depression screen [facility] 2/9 01/14/2015 12/01/2014 06/25/2014 10/15/2013 10/04/2013 08/22/2013 06/11/2013  Decreased Interest 3 0 0 3 0 1 3  Down, Depressed, Hopeless 3 3 1 3  0 1 3  PHQ - 2 Score 6 3 1 6  0 2 6  Altered sleeping - 3 - 3 0 (No Data) 0  Tired, decreased energy - 3 - 3 0 3 3  Change in appetite - 3 - 3 0 1 3  Feeling bad or failure about yourself  - 3 - 3 0 0 3  Trouble concentrating - 3 - 0 0 0 0  Moving slowly or fidgety/restless - 3 - 3 0 0 0  Suicidal thoughts - 0 - 3 - 0 0  PHQ-9 Score - 21 - 24 0 - 15     Review of Systems  Respiratory: Positive for apnea, cough and shortness of breath.   Gastrointestinal: Positive for nausea, vomiting and constipation.  Genitourinary: Positive for dysuria.  Musculoskeletal:       Spasms   Neurological: Positive for dizziness and weakness.  Psychiatric/Behavioral: Positive for confusion and dysphoric mood. The patient is nervous/anxious.   All other systems reviewed and are negative.      Objective:   Physical Exam  GENERAL: The patient is generally alert, although she appears to be in  a bit of malaise which is not too uncommon for her. She appears  depressed.  EXTREMITIES: Gait is wide-based and she uses the crutch to unload the  ankles. She is diffusely tender with palpation from the neck,  shoulders, elbows, hands, trunk, knees and all way down to the feet. Shoulders are sensitive to touch and basic AROM/PROM Sensory exam is grossly intact. Reflexes are  1+. She has lost weight. Knees are notable for valgus deformities. She has lateral joint line pain bilaterall and crepitus with AROM and PROM . She is antalgic on the right ankle. She uses her crutch to help offload the right leg. She has limited ROM in the lumbar spine  PSYCH: mood is pleasant  SKIN: healing lesion on scalp    ASSESSMENT:  1. Fibromyalgia syndrome.  2. 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  3.  Myofascial shoulder pain.  4. Temporal arteritis.  5. Depression.  6. History of insomnia.   7. Osteoarthritis of the knees, chronic right ankle pain.  PLAN:  1. REFILLED10/325 #120 and methadone will wean down to 10mg  BID #60 today.  2. Gabapentin 100mg  BID morning and evening.  3. Consider steroid injections to knees vs viscosupplement. Likely needs ortho opinion regarding knee replacement 4. Crutch or cane to help offload right leg. Ankle sleeve would be helpful. She needs to have crutches or walker with her when she leaves the house 5. 15 minutes of face to face patient care time were spent during this visit. All questions were encouraged and answered. Follow up with my NP next month.

## 2015-05-21 ENCOUNTER — Encounter: Payer: Medicare Other | Admitting: Registered Nurse

## 2015-05-28 ENCOUNTER — Encounter (HOSPITAL_COMMUNITY): Payer: Self-pay | Admitting: Emergency Medicine

## 2015-05-28 ENCOUNTER — Emergency Department (HOSPITAL_COMMUNITY): Payer: Medicare Other

## 2015-05-28 ENCOUNTER — Emergency Department (HOSPITAL_COMMUNITY)
Admission: EM | Admit: 2015-05-28 | Discharge: 2015-05-28 | Disposition: A | Discharge status: Home / Self Care | Payer: Medicare Other | Attending: Emergency Medicine | Admitting: Emergency Medicine

## 2015-05-28 DIAGNOSIS — M542 Cervicalgia: Secondary | ICD-10-CM | POA: Diagnosis not present

## 2015-05-28 DIAGNOSIS — S93429A Sprain of deltoid ligament of unspecified ankle, initial encounter: Secondary | ICD-10-CM

## 2015-05-28 DIAGNOSIS — F329 Major depressive disorder, single episode, unspecified: Secondary | ICD-10-CM | POA: Insufficient documentation

## 2015-05-28 DIAGNOSIS — I10 Essential (primary) hypertension: Secondary | ICD-10-CM | POA: Diagnosis not present

## 2015-05-28 DIAGNOSIS — M545 Low back pain: Secondary | ICD-10-CM | POA: Diagnosis not present

## 2015-05-28 DIAGNOSIS — Z79899 Other long term (current) drug therapy: Secondary | ICD-10-CM | POA: Diagnosis not present

## 2015-05-28 DIAGNOSIS — M7989 Other specified soft tissue disorders: Secondary | ICD-10-CM | POA: Diagnosis not present

## 2015-05-28 DIAGNOSIS — W19XXXA Unspecified fall, initial encounter: Secondary | ICD-10-CM

## 2015-05-28 DIAGNOSIS — S93402A Sprain of unspecified ligament of left ankle, initial encounter: Secondary | ICD-10-CM | POA: Insufficient documentation

## 2015-05-28 DIAGNOSIS — W01198A Fall on same level from slipping, tripping and stumbling with subsequent striking against other object, initial encounter: Secondary | ICD-10-CM | POA: Diagnosis not present

## 2015-05-28 DIAGNOSIS — Y9289 Other specified places as the place of occurrence of the external cause: Secondary | ICD-10-CM | POA: Diagnosis not present

## 2015-05-28 DIAGNOSIS — Y9389 Activity, other specified: Secondary | ICD-10-CM | POA: Diagnosis not present

## 2015-05-28 DIAGNOSIS — R51 Headache: Secondary | ICD-10-CM | POA: Diagnosis not present

## 2015-05-28 DIAGNOSIS — Z862 Personal history of diseases of the blood and blood-forming organs and certain disorders involving the immune mechanism: Secondary | ICD-10-CM | POA: Insufficient documentation

## 2015-05-28 DIAGNOSIS — G894 Chronic pain syndrome: Secondary | ICD-10-CM | POA: Diagnosis not present

## 2015-05-28 DIAGNOSIS — S93401A Sprain of unspecified ligament of right ankle, initial encounter: Secondary | ICD-10-CM | POA: Diagnosis not present

## 2015-05-28 DIAGNOSIS — S99912A Unspecified injury of left ankle, initial encounter: Secondary | ICD-10-CM | POA: Diagnosis not present

## 2015-05-28 DIAGNOSIS — K219 Gastro-esophageal reflux disease without esophagitis: Secondary | ICD-10-CM | POA: Diagnosis not present

## 2015-05-28 DIAGNOSIS — S63519D Sprain of carpal joint of unspecified wrist, subsequent encounter: Secondary | ICD-10-CM | POA: Diagnosis not present

## 2015-05-28 DIAGNOSIS — S0990XA Unspecified injury of head, initial encounter: Secondary | ICD-10-CM | POA: Diagnosis not present

## 2015-05-28 DIAGNOSIS — S199XXA Unspecified injury of neck, initial encounter: Secondary | ICD-10-CM | POA: Diagnosis not present

## 2015-05-28 DIAGNOSIS — Z8781 Personal history of (healed) traumatic fracture: Secondary | ICD-10-CM | POA: Insufficient documentation

## 2015-05-28 DIAGNOSIS — S299XXA Unspecified injury of thorax, initial encounter: Secondary | ICD-10-CM | POA: Diagnosis not present

## 2015-05-28 DIAGNOSIS — E119 Type 2 diabetes mellitus without complications: Secondary | ICD-10-CM | POA: Insufficient documentation

## 2015-05-28 DIAGNOSIS — Y998 Other external cause status: Secondary | ICD-10-CM | POA: Diagnosis not present

## 2015-05-28 DIAGNOSIS — S3992XA Unspecified injury of lower back, initial encounter: Secondary | ICD-10-CM | POA: Diagnosis not present

## 2015-05-28 LAB — BASIC METABOLIC PANEL
Anion gap: 10 (ref 5–15)
BUN: 9 mg/dL (ref 6–20)
CHLORIDE: 101 mmol/L (ref 101–111)
CO2: 27 mmol/L (ref 22–32)
CREATININE: 0.92 mg/dL (ref 0.44–1.00)
Calcium: 9.5 mg/dL (ref 8.9–10.3)
GFR calc Af Amer: >60 mL/min (ref >60)
GFR calc non Af Amer: >60 mL/min (ref >60)
GLUCOSE: 105 mg/dL — AB (ref 65–99)
Potassium: 3.8 mmol/L (ref 3.5–5.1)
SODIUM: 138 mmol/L (ref 135–145)

## 2015-05-28 LAB — CBC WITH DIFFERENTIAL/PLATELET
Basophils Absolute: 0 10*3/uL (ref 0.0–0.1)
Basophils Relative: 0 %
EOS ABS: 0.1 10*3/uL (ref 0.0–0.7)
Eosinophils Relative: 0 %
HCT: 34 % — ABNORMAL LOW (ref 36.0–46.0)
HEMOGLOBIN: 11.2 g/dL — AB (ref 12.0–15.0)
LYMPHS ABS: 4 10*3/uL (ref 0.7–4.0)
Lymphocytes Relative: 33 %
MCH: 28.8 pg (ref 26.0–34.0)
MCHC: 32.9 g/dL (ref 30.0–36.0)
MCV: 87.4 fL (ref 78.0–100.0)
MONOS PCT: 7 %
Monocytes Absolute: 0.9 10*3/uL (ref 0.1–1.0)
NEUTROS PCT: 60 %
Neutro Abs: 7.1 10*3/uL (ref 1.7–7.7)
Platelets: 216 10*3/uL (ref 150–400)
RBC: 3.89 MIL/uL (ref 3.87–5.11)
RDW: 14 % (ref 11.5–15.5)
WBC: 12 10*3/uL — ABNORMAL HIGH (ref 4.0–10.5)

## 2015-05-28 MED ORDER — MORPHINE SULFATE (PF) 4 MG/ML IV SOLN
4.0000 mg | INTRAVENOUS | Status: DC | PRN
  Administered 2015-05-28: 4 mg via INTRAVENOUS
  Filled 2015-05-28: qty 1

## 2015-05-28 MED ORDER — MECLIZINE HCL 25 MG PO TABS: 25.0000 mg | ORAL_TABLET | Freq: Three times a day (TID) | ORAL | Status: DC | PRN

## 2015-05-28 NOTE — ED Notes (Signed)
MD at bedside.EDP Knapp in room

## 2015-05-28 NOTE — Discharge Instructions (Signed)
Ankle Sprain  An ankle sprain is an injury to the strong, fibrous tissues (ligaments) that hold the bones of your ankle joint together.   CAUSES  An ankle sprain is usually caused by a fall or by twisting your ankle. Ankle sprains most commonly occur when you step on the outer edge of your foot, and your ankle turns inward. People who participate in sports are more prone to these types of injuries.   SYMPTOMS    Pain in your ankle. The pain may be present at rest or only when you are trying to stand or walk.   Swelling.   Bruising. Bruising may develop immediately or within 1 to 2 days after your injury.   Difficulty standing or walking, particularly when turning corners or changing directions.  DIAGNOSIS   Your caregiver will ask you details about your injury and perform a physical exam of your ankle to determine if you have an ankle sprain. During the physical exam, your caregiver will press on and apply pressure to specific areas of your foot and ankle. Your caregiver will try to move your ankle in certain ways. An X-ray exam may be done to be sure a bone was not broken or a ligament did not separate from one of the bones in your ankle (avulsion fracture).   TREATMENT   Certain types of braces can help stabilize your ankle. Your caregiver can make a recommendation for this. Your caregiver may recommend the use of medicine for pain. If your sprain is severe, your caregiver may refer you to a surgeon who helps to restore function to parts of your skeletal system (orthopedist) or a physical therapist.  HOME CARE INSTRUCTIONS    Apply ice to your injury for 1-2 days or as directed by your caregiver. Applying ice helps to reduce inflammation and pain.    Put ice in a plastic bag.    Place a towel between your skin and the bag.    Leave the ice on for 15-20 minutes at a time, every 2 hours while you are awake.   Only take over-the-counter or prescription medicines for pain, discomfort, or fever as directed by  your caregiver.   Elevate your injured ankle above the level of your heart as much as possible for 2-3 days.   If your caregiver recommends crutches, use them as instructed. Gradually put weight on the affected ankle. Continue to use crutches or a cane until you can walk without feeling pain in your ankle.   If you have a plaster splint, wear the splint as directed by your caregiver. Do not rest it on anything harder than a pillow for the first 24 hours. Do not put weight on it. Do not get it wet. You may take it off to take a shower or bath.   You may have been given an elastic bandage to wear around your ankle to provide support. If the elastic bandage is too tight (you have numbness or tingling in your foot or your foot becomes cold and blue), adjust the bandage to make it comfortable.   If you have an air splint, you may blow more air into it or let air out to make it more comfortable. You may take your splint off at night and before taking a shower or bath. Wiggle your toes in the splint several times per day to decrease swelling.  SEEK MEDICAL CARE IF:    You have rapidly increasing bruising or swelling.   Your toes feel   extremely cold or you lose feeling in your foot.   Your pain is not relieved with medicine.  SEEK IMMEDIATE MEDICAL CARE IF:   Your toes are numb or blue.   You have severe pain that is increasing.  MAKE SURE YOU:    Understand these instructions.   Will watch your condition.   Will get help right away if you are not doing well or get worse.     This information is not intended to replace advice given to you by your health care provider. Make sure you discuss any questions you have with your health care provider.     Document Released: 04/25/2005 Document Revised: 05/16/2014 Document Reviewed: 05/07/2011  [employer]vier Interactive Patient Education 2016 [employer]

## 2015-05-28 NOTE — ED Notes (Signed)
PT has been using  Crutches since last ankle injury.. Which was approx. 5 years ago

## 2015-05-28 NOTE — ED Notes (Signed)
Pt sts fall last night and hit head and scratched face; pt sts bilateral ankle and knee pain with some back pain

## 2015-05-28 NOTE — ED Provider Notes (Signed)
CSN: [id number]     Arrival date & time 05/28/15  2671 History   First MD Initiated Contact with Patient 05/28/15 1036     Chief Complaint  Patient presents with  . Fall    Patient is a 60 y.o. female presenting with fall.  Fall Associated symptoms include headaches (for the last 2 days). Pertinent negatives include no chest pain and no shortness of breath.   Pt was walking last night.  She felt like her foot slipped and she fell.  She hit her head and her foot.  She was starting to get up when she fell again.  This happened one more time.  Pt is not sure exactly why she kept falling but thinks because she was weak.  She felt weak all over.  Her body felt limp.  She was eventually able to get up and walk to her bedroom.  This morning her ankles were feeling weak again so she decided to come in.  She is having pain in her ankles from the fall.  Her back is also hurting and she noticed some knots on her head.   Complains of some dizziness, not clearly vertigo.  No focal neuro deficits. Past Medical History  Diagnosis Date  . Essential hypertension   . Toe fracture 2001/2002    Left pinky toe  . Gastritis     Chronic, per biopsy  . Depression   . Seasonal allergic rhinitis     Spring time  . Gastroesophageal reflux disease   . Anxiety     Panic disorder  . Anemia     Anemia of chronic disease  . Fibromyalgia   . Chronic pain syndrome   . Osteopenia     DEXA 05/06/2008: L spine T -1.0, Left femur T -0.9, Right femur T -1.1  . Inappropriate sinus tachycardia ([place])   . Refusal of blood transfusions as patient is Jehovah's Witness   . Type II diabetes mellitus ([place]) 6/09  . Migraine     "q 2-3 months" (10/15/2013)  . Osteoarthritis of both knees     Tricompartmental, worse in lateral compartment  . Osteoarthritis of both shoulders     Mild  . Chronic back pain    Past Surgical History  Procedure Laterality Date  . Temporal artery biopsy / ligation Left 6/09    Negative for  temperal arteritis  . Cystoscopy      With hydrolic bladder distension  . Vaginal hysterectomy  2003    with oophorectomy, for dysfunctional uterine bleeding  . Cholecystectomy      for chronic acalculus cholecystitis  . Tubal ligation     Family History  Problem Relation Age of Onset  . Hypertension Mother   . Cerebral aneurysm Maternal Aunt   . Early death Father 41    Raod accident  . Healthy Sister   . Healthy Brother   . Healthy Daughter   . Cerebral aneurysm Maternal Grandfather   . Cerebral aneurysm Maternal Aunt   . Healthy Sister   . Healthy Sister   . Healthy Sister   . Healthy Sister   . Healthy Brother   . Healthy Brother   . Healthy Grandchild    Social History  Substance Use Topics  . Smoking status: Never Smoker   . Smokeless tobacco: Never Used  . Alcohol Use: No     Comment: Rarely.    OB History    No data available     Review of Systems  Constitutional: Negative for fever.  Respiratory: Positive for cough (bringing up mucus). Negative for shortness of breath.   Cardiovascular: Negative for chest pain.  Gastrointestinal: Negative for vomiting.  Genitourinary: Negative for dysuria.  Neurological: Positive for headaches (for the last 2 days).      Allergies  Ibuprofen; Methocarbamol; Propoxyphene n-acetaminophen; Citric acid; and Oxycodone-acetaminophen  Home Medications   Prior to Admission medications   Medication Sig Start Date End Date Taking? Authorizing Provider  baclofen (LIORESAL) 10 MG tablet TAKE ONE TABLET BY MOUTH TWICE DAILY 01/01/15  Yes Meredith Staggers, MD  diphenhydrAMINE (BENADRYL) 25 mg capsule Take 1 capsule (25 mg total) by mouth every 12 (twelve) hours as needed for itching or allergies. 12/01/14 12/01/15 Yes Alex Ronnie Derby, DO  gabapentin (NEURONTIN) 100 MG capsule Take 1 capsule (100 mg total) by mouth 3 (three) times daily. 06/19/14  Yes Bayard Hugger, NP  guaiFENesin (MUCINEX) 600 MG 12 hr tablet Take 600 mg by mouth 2  (two) times daily as needed for cough or to loosen phlegm.   Yes Historical Provider, MD  HYDROcodone-acetaminophen (NORCO) 10-325 MG tablet Take 1 tablet by mouth every 6 (six) hours as needed. For pain 04/20/15  Yes Meredith Staggers, MD  ketotifen (ZADITOR) 0.025 % ophthalmic solution Place 1 drop into both eyes as needed (dry eyes).   Yes Historical Provider, MD  methadone (DOLOPHINE) 10 MG tablet Take One  tablet in the Morning and One Tablet in the Evening Patient taking differently: Take 5-15 mg by mouth 2 (two) times daily. Take One and one half tablets in the Morning and One Tablet in the Evening 04/20/15  Yes Meredith Staggers, MD  metoprolol tartrate (LOPRESSOR) 25 MG tablet TAKE ONE-HALF TABLET BY MOUTH TWICE DAILY 08/18/14  Yes Norman Herrlich, MD  venlafaxine (EFFEXOR) 75 MG tablet TAKE THREE TABLETS BY MOUTH IN THE MORNING, THEN TAKE TWO TABLETS AT BEDTIME DAILY 04/14/15  Yes Norman Herrlich, MD  ACCU-CHEK FASTCLIX LANCETS MISC 1 each by Does not apply route daily. Check blood sugar once per day. 12/03/13   Charlesetta Shanks, MD  Blood Glucose Monitoring Suppl (ACCU-CHEK NANO SMARTVIEW) W/DEVICE KIT 1 kit by Does not apply route daily. Check blood sugar once a day. 12/03/13   Langley Gauss Moding, MD  glucose blood (ACCU-CHEK SMARTVIEW) test strip Check blood sugar once a day. 12/03/13   Langley Gauss Moding, MD   BP 128/72 mmHg  Pulse 91  Temp(Src) 99 F (37.2 C) (Oral)  Resp 18  SpO2 97%  LMP 03/08/2002 Physical Exam  Constitutional: She is oriented to person, place, and time. She appears well-developed and well-nourished. No distress.  HENT:  Head: Normocephalic and atraumatic.  Right Ear: External ear normal.  Left Ear: External ear normal.  Mouth/Throat: Oropharynx is clear and moist.  Eyes: Conjunctivae are normal. Right eye exhibits no discharge. Left eye exhibits no discharge. No scleral icterus.  Neck: Neck supple. No tracheal deviation present.  Cardiovascular: Normal rate, regular  rhythm and intact distal pulses.   Pulmonary/Chest: Effort normal and breath sounds normal. No stridor. No respiratory distress. She has no wheezes. She has no rales.  Abdominal: Soft. Bowel sounds are normal. She exhibits no distension. There is no tenderness. There is no rebound and no guarding.  Musculoskeletal: She exhibits no edema.       Right shoulder: Normal.       Left shoulder: Normal.       Right hip: Normal.  Left hip: Normal.       Right ankle: She exhibits no swelling, no ecchymosis and no deformity. Tenderness.       Left ankle: She exhibits no swelling, no ecchymosis and no deformity. Tenderness.       Cervical back: She exhibits bony tenderness.       Thoracic back: Normal.       Lumbar back: She exhibits tenderness. She exhibits no swelling and no edema.  Neurological: She is alert and oriented to person, place, and time. She has normal strength. No cranial nerve deficit (no facial droop, extraocular movements intact, no slurred speech) or sensory deficit. She exhibits normal muscle tone. She displays no seizure activity. Coordination normal.  No pronator drift bilateral upper extrem, able to hold both legs off bed for 5 seconds, sensation intact in all extremities, no visual field cuts, no left or right sided neglect, , no nystagmus noted   Skin: Skin is warm and dry. No rash noted.  Psychiatric: She has a normal mood and affect.  Nursing note and vitals reviewed.   ED Course  Procedures (including critical care time) Labs Review Labs Reviewed  CBC WITH DIFFERENTIAL/PLATELET - Abnormal; Notable for the following:    WBC 12.0 (*)    Hemoglobin 11.2 (*)    HCT 34.0 (*)    All other components within normal limits  BASIC METABOLIC PANEL - Abnormal; Notable for the following:    Glucose, Bld 105 (*)    All other components within normal limits    Imaging Review Dg Chest 2 View  05/28/2015  CLINICAL DATA:  [place] forward from a standing position and then twice  more while trying to stand up. Injury. Initial encounter. EXAM: CHEST  2 VIEW COMPARISON:  10/15/2013 FINDINGS: The cardiomediastinal silhouette is within normal limits. The lungs are mildly hypoinflated and clear. There is no evidence of pleural effusion or pneumothorax. No acute osseous abnormality is identified. IMPRESSION: No active cardiopulmonary disease. Electronically Signed   By: Logan Bores M.D.   On: 05/28/2015 12:06   Dg Lumbar Spine Complete  05/28/2015  CLINICAL DATA:  Fall forward from standing position, lower back pain EXAM: LUMBAR SPINE - COMPLETE 4+ VIEW COMPARISON:  09/04/2013 FINDINGS: Five views of lumbar spine submitted. There is mild osteopenia. Mild disc space flattening at L4-L5 and L5-S1 level. Stable mild anterior spurring upper endplate of L4. Stable mild anterior spurring upper and lower endplate of L5 vertebral body. Alignment and vertebral body heights are preserved. IMPRESSION: No acute fracture or subluxation. Mild osteopenia. Mild degenerative changes as described above. Electronically Signed   By: Lahoma Crocker M.D.   On: 05/28/2015 12:19   Dg Ankle Complete Left  05/28/2015  CLINICAL DATA:  Fall forward from standing position EXAM: LEFT ANKLE COMPLETE - 3+ VIEW COMPARISON:  None. FINDINGS: Three views of left ankle submitted. No acute fracture or subluxation. Small plantar and posterior spur of calcaneus. Ankle mortise is preserved. No soft tissue swelling. IMPRESSION: No acute fracture or subluxation. Small plantar and posterior spur of calcaneus. Electronically Signed   By: Lahoma Crocker M.D.   On: 05/28/2015 12:10   Dg Ankle Complete Right  05/28/2015  CLINICAL DATA:  [place] forward from standing position and then twice more while trying to stand up. Anterior ankle pain bilaterally. Initial encounter. EXAM: RIGHT ANKLE - COMPLETE 3+ VIEW COMPARISON:  11/19/2005 FINDINGS: There is very mild soft tissue swelling about the ankle, less than on the prior study. No acute fracture  or dislocation is identified. Posterior larger than plantar calcaneal enthesophytes are again seen. No lytic or blastic osseous lesion or radiopaque foreign body. IMPRESSION: Mild soft tissue swelling without acute osseous abnormality identified. Electronically Signed   By: Logan Bores M.D.   On: 05/28/2015 12:09   Ct Head Wo Contrast  05/28/2015  CLINICAL DATA:  [place] last evening. Pain involving the right side of the head and posterior neck. Uncertain loss of consciousness. Initial encounter. EXAM: CT HEAD WITHOUT CONTRAST CT CERVICAL SPINE WITHOUT CONTRAST TECHNIQUE: Multidetector CT imaging of the head and cervical spine was performed following the standard protocol without intravenous contrast. Multiplanar CT image reconstructions of the cervical spine were also generated. COMPARISON:  Soft tissue neck CT 10/15/2013. Head CT 11/01/2007. Brain MRI 09/21/2007. Cervical spine MRI 06/30/2005. FINDINGS: CT HEAD FINDINGS There is no evidence of acute cortical infarct, intracranial hemorrhage, mass, midline shift, or extra-axial fluid collection. Ventricles and sulci are normal. Orbits are unremarkable. The visualized paranasal sinuses and mastoid air cells are clear. No skull fracture is identified. Calcified atherosclerosis is noted at the skullbase. CT CERVICAL SPINE FINDINGS Vertebral alignment is normal. Vertebral body heights are preserved. No cervical spine fracture is identified. Mild degenerative endplate spurring is present throughout the mid and lower cervical spine. Paraspinal soft tissues are unremarkable. IMPRESSION: 1. No evidence of acute intracranial abnormality. 2. No acute osseous abnormality identified in the cervical spine. Mild cervical spondylosis. Electronically Signed   By: Logan Bores M.D.   On: 05/28/2015 11:35   Ct Cervical Spine Wo Contrast  05/28/2015  CLINICAL DATA:  [place] last evening. Pain involving the right side of the head and posterior neck. Uncertain loss of consciousness.  Initial encounter. EXAM: CT HEAD WITHOUT CONTRAST CT CERVICAL SPINE WITHOUT CONTRAST TECHNIQUE: Multidetector CT imaging of the head and cervical spine was performed following the standard protocol without intravenous contrast. Multiplanar CT image reconstructions of the cervical spine were also generated. COMPARISON:  Soft tissue neck CT 10/15/2013. Head CT 11/01/2007. Brain MRI 09/21/2007. Cervical spine MRI 06/30/2005. FINDINGS: CT HEAD FINDINGS There is no evidence of acute cortical infarct, intracranial hemorrhage, mass, midline shift, or extra-axial fluid collection. Ventricles and sulci are normal. Orbits are unremarkable. The visualized paranasal sinuses and mastoid air cells are clear. No skull fracture is identified. Calcified atherosclerosis is noted at the skullbase. CT CERVICAL SPINE FINDINGS Vertebral alignment is normal. Vertebral body heights are preserved. No cervical spine fracture is identified. Mild degenerative endplate spurring is present throughout the mid and lower cervical spine. Paraspinal soft tissues are unremarkable. IMPRESSION: 1. No evidence of acute intracranial abnormality. 2. No acute osseous abnormality identified in the cervical spine. Mild cervical spondylosis. Electronically Signed   By: Logan Bores M.D.   On: 05/28/2015 11:35   I have personally reviewed and evaluated these images and lab results as part of my medical decision-making.   EKG Interpretation   Date/Time:  Thursday May 28 2015 12:16:26 EST Ventricular Rate:  90 PR Interval:  168 QRS Duration: 86 QT Interval:  441 QTC Calculation: 540 R Axis:   7 Text Interpretation:  Sinus rhythm Abnormal R-wave progression, early  transition LVH by voltage Nonspecific T abnrm, anterolateral leads  Prolonged QT interval Baseline wander in lead(s) V1 Since last tracing  rate slower Confirmed by Madonna Flegal  MD-J, Tymara Saur ([id number]) on 05/28/2015 12:27:40 PM      MDM   Final diagnoses:  Fall, initial encounter  Carpal  joint sprain, unspecified laterality, initial encounter  No evidence of serious injury associated with the fall.  No focal weakness.  Doubt stroke or other acute emergency condition. Consistent with soft tissue injury/strain. ?mild vertigo Explained findings to patient and warning signs that should prompt return to the ED.  Follow up with PCP    Dorie Rank, MD 05/28/15 [phone]

## 2015-05-28 NOTE — ED Notes (Signed)
Bilateral pulses strong and skin warm.

## 2015-06-04 ENCOUNTER — Telehealth: Payer: Self-pay | Admitting: Registered Nurse

## 2015-06-04 ENCOUNTER — Encounter: Payer: Self-pay | Admitting: Registered Nurse

## 2015-06-04 ENCOUNTER — Encounter: Payer: Medicare Other | Attending: Registered Nurse | Admitting: Registered Nurse

## 2015-06-04 DIAGNOSIS — M545 Low back pain: Secondary | ICD-10-CM | POA: Diagnosis not present

## 2015-06-04 DIAGNOSIS — M79605 Pain in left leg: Secondary | ICD-10-CM | POA: Insufficient documentation

## 2015-06-04 DIAGNOSIS — G8929 Other chronic pain: Secondary | ICD-10-CM | POA: Diagnosis not present

## 2015-06-04 DIAGNOSIS — M25551 Pain in right hip: Secondary | ICD-10-CM | POA: Diagnosis not present

## 2015-06-04 DIAGNOSIS — M62830 Muscle spasm of back: Secondary | ICD-10-CM

## 2015-06-04 DIAGNOSIS — M25552 Pain in left hip: Secondary | ICD-10-CM | POA: Insufficient documentation

## 2015-06-04 DIAGNOSIS — M797 Fibromyalgia: Secondary | ICD-10-CM

## 2015-06-04 DIAGNOSIS — IMO0001 Reserved for inherently not codable concepts without codable children: Secondary | ICD-10-CM

## 2015-06-04 DIAGNOSIS — M609 Myositis, unspecified: Secondary | ICD-10-CM

## 2015-06-04 DIAGNOSIS — M542 Cervicalgia: Secondary | ICD-10-CM | POA: Diagnosis not present

## 2015-06-04 DIAGNOSIS — G894 Chronic pain syndrome: Secondary | ICD-10-CM | POA: Diagnosis not present

## 2015-06-04 DIAGNOSIS — Z79899 Other long term (current) drug therapy: Secondary | ICD-10-CM

## 2015-06-04 DIAGNOSIS — M791 Myalgia: Secondary | ICD-10-CM

## 2015-06-04 DIAGNOSIS — Z760 Encounter for issue of repeat prescription: Secondary | ICD-10-CM | POA: Insufficient documentation

## 2015-06-04 DIAGNOSIS — M79604 Pain in right leg: Secondary | ICD-10-CM | POA: Insufficient documentation

## 2015-06-04 DIAGNOSIS — Z5181 Encounter for therapeutic drug level monitoring: Secondary | ICD-10-CM

## 2015-06-04 DIAGNOSIS — M170 Bilateral primary osteoarthritis of knee: Secondary | ICD-10-CM

## 2015-06-04 NOTE — Progress Notes (Signed)
Subjective:    Patient ID: Morgan Wilkerson, female    DOB: 1956/04/19, 60 y.o.   MRN: [id number]  HPI: Morgan Wilkerson is a 60 year old female who returns for follow up for chronic pain and medication refill. She says her pain is located in her neck, bilateral shoulder's,lower back, bilateral knees and bilateral lower extremities. She rates her pain 9. Her current exercise regime is performing stretching exercises using bands and walking. Morgan Wilkerson brought her Hydrocodone bottle dated 12/08/2014 with no pills noted. Her Methadone bottle was dated 04/09/2015 with no pills noted. [facility] was reviewed Hydrocodone was picked up on 04/09/2015 the prescription was dated 03/25/2015. Morgan Wilkerson seen Morgan Wilkerson on 04/20/2015 prescription for Methadone and Hydrocodone was given she states she thinks the prescription is in her purse at home. Pharmacy was called no prescriptions are on file. I spoke with Morgan Wilkerson regarding the matter, they will go home and try to find the prescriptions they were instructed to call office tomorrow they verbalizes understanding. Also asked Morgan Wilkerson to help his wife to set up appointment with neurologist for memory loss he verbalizes understanding.  Pain Inventory Average Pain 9 Pain Right Now 9 My pain is constant, sharp and stabbing  In the last 24 hours, has pain interfered with the following? General activity 9 Relation with others 9 Enjoyment of life 9 What TIME of day is your pain at its worst? night Sleep (in general) Poor  Pain is worse with: some activites Pain improves with: medication Relief from Meds: 3  Mobility walk with assistance use a cane  Function I need assistance with the following:  bathing, meal prep, household duties and shopping  Neuro/Psych weakness trouble walking dizziness  Prior Studies Any changes since last visit?  no  Physicians involved in your care Any changes since last visit?  no   Family History  Problem Relation Age  of Onset  . Hypertension Mother   . Cerebral aneurysm Maternal Aunt   . Early death Father 19    Raod accident  . Healthy Sister   . Healthy Brother   . Healthy Daughter   . Cerebral aneurysm Maternal Grandfather   . Cerebral aneurysm Maternal Aunt   . Healthy Sister   . Healthy Sister   . Healthy Sister   . Healthy Sister   . Healthy Brother   . Healthy Brother   . Healthy Grandchild    Social History   Social History  . Marital Status: Married    Spouse Name: N/A  . Number of Children: N/A  . Years of Education: 14   Occupational History  .     Social History Main Topics  . Smoking status: Never Smoker   . Smokeless tobacco: Never Used  . Alcohol Use: No     Comment: Rarely.   . Drug Use: No  . Sexual Activity: Not Currently   Other Topics Concern  . None   Social History Narrative   Patients phone number is [phone]   Past Surgical History  Procedure Laterality Date  . Temporal artery biopsy / ligation Left 6/09    Negative for temperal arteritis  . Cystoscopy      With hydrolic bladder distension  . Vaginal hysterectomy  2003    with oophorectomy, for dysfunctional uterine bleeding  . Cholecystectomy      for chronic acalculus cholecystitis  . Tubal ligation     Past Medical History  Diagnosis Date  .  Essential hypertension   . Toe fracture 2001/2002    Left pinky toe  . Gastritis     Chronic, per biopsy  . Depression   . Seasonal allergic rhinitis     [place]  . Gastroesophageal reflux disease   . Anxiety     Panic disorder  . Anemia     Anemia of chronic disease  . Fibromyalgia   . Chronic pain syndrome   . Osteopenia     DEXA 05/06/2008: L spine T -1.0, Left femur T -0.9, Right femur T -1.1  . Inappropriate sinus tachycardia ([place])   . Refusal of blood transfusions as patient is Jehovah's Witness   . Type II diabetes mellitus ([place]) 6/09  . Migraine     "q 2-3 months" (10/15/2013)  . Osteoarthritis of both knees      Tricompartmental, worse in lateral compartment  . Osteoarthritis of both shoulders     Mild  . Chronic back pain    LMP 03/08/2002  Opioid Risk Score:   Fall Risk Score:  `1  Depression screen PHQ 2/9  Depression screen [facility] 2/9 06/04/2015 01/14/2015 12/01/2014 06/25/2014 10/15/2013 10/04/2013 08/22/2013  Decreased Interest 3 3 0 0 3 0 1  Down, Depressed, Hopeless 3 3 3 1 3  0 1  PHQ - 2 Score 6 6 3 1 6  0 2  Altered sleeping - - 3 - 3 0 (No Data)  Tired, decreased energy - - 3 - 3 0 3  Change in appetite - - 3 - 3 0 1  Feeling bad or failure about yourself  - - 3 - 3 0 0  Trouble concentrating - - 3 - 0 0 0  Moving slowly or fidgety/restless - - 3 - 3 0 0  Suicidal thoughts - - 0 - 3 - 0  PHQ-9 Score - - 21 - 24 0 -    Review of Systems  All other systems reviewed and are negative.      Objective:   Physical Exam  Constitutional: She is oriented to person, place, and time. She appears well-developed and well-nourished.  HENT:  Head: Normocephalic and atraumatic.  Neck: Normal range of motion. Neck supple.  Cardiovascular: Normal rate and regular rhythm.   Pulmonary/Chest: Effort normal and breath sounds normal.  Musculoskeletal:  Normal Muscle Bulk and Muscle Testing Reveals: Upper Extremities: Full ROM and Muscle Strength 5/5 Lumbar Paraspinal Tenderness: L-3- L-5 Lower Extremities: Full ROM and Muscle Strength 5/5 Bilateral Lower Extremities Flexion Produces Pain into Patella Arises from chair slowly using crutches for support Antalgic gait  Neurological: She is alert and oriented to person, place, and time.  Skin: Skin is warm and dry.  Psychiatric: She has a normal mood and affect.  Nursing note and vitals reviewed.         Assessment & Plan:  1. Fibromyalgia syndrome: Continue with exercise, heat and ice therapy.  2. 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 No prescriptions given. She will be continued  on   Norco 10/325 mg one tablet every 6 hours as needed #120, and Methadone to 10 mg BID 3. Myofascial shoulder pain: Continue Current Medication Regime.  4. Depression: On Effexor. Continue to monitor.  5. History of insomnia. No Complaints Today. Continue to monitor.  6. Osteoarthritis of the knees: Continue to Monitor.  30 minutes of face to face patient care time was spent during this visit. All questions were encouraged and answered.

## 2015-06-04 NOTE — Telephone Encounter (Signed)
Spoke with Mrs. Morgan Wilkerson daughter Morgan Wilkerson regarding December prescriptions. She will call her mother Morgan Wilkerson to see if the prescriptions have been found.

## 2015-06-05 ENCOUNTER — Other Ambulatory Visit: Payer: Self-pay | Admitting: Internal Medicine

## 2015-06-05 ENCOUNTER — Other Ambulatory Visit: Payer: Self-pay | Admitting: Registered Nurse

## 2015-06-05 DIAGNOSIS — G894 Chronic pain syndrome: Secondary | ICD-10-CM

## 2015-06-05 DIAGNOSIS — M797 Fibromyalgia: Secondary | ICD-10-CM

## 2015-06-05 MED ORDER — METHADONE HCL 10 MG PO TABS: ORAL_TABLET | ORAL | Status: DC

## 2015-06-05 MED ORDER — HYDROCODONE-ACETAMINOPHEN 10-325 MG PO TABS: 1.0000 | ORAL_TABLET | Freq: Four times a day (QID) | ORAL | Status: DC | PRN

## 2015-06-05 NOTE — Telephone Encounter (Signed)
Mr. Kipper brought in a receipt from [facility] when her Hydrocodone and Methadone was fille. I spoke with Pharmacist asked them to document the 04/20/15 prescription will not be filled. She verbalizes understanding. Prescription printed today:  Methadone 10 mg BID #60 and Hydrocodone 10/325 mg #120. Mr. Marolda signed for prescriptions per policy

## 2015-06-05 NOTE — Telephone Encounter (Addendum)
Patient requesting a refill on her Effexor and her Lopressor medications

## 2015-06-08 ENCOUNTER — Other Ambulatory Visit: Payer: Self-pay | Admitting: Internal Medicine

## 2015-06-10 ENCOUNTER — Encounter: Payer: Self-pay | Admitting: Internal Medicine

## 2015-06-10 ENCOUNTER — Encounter: Payer: Medicare Other | Admitting: Internal Medicine

## 2015-06-10 LAB — TOXASSURE SELECT,+ANTIDEPR,UR: PDF: 0

## 2015-06-11 MED ORDER — VENLAFAXINE HCL 75 MG PO TABS: ORAL_TABLET | ORAL | Status: DC

## 2015-06-11 MED ORDER — METOPROLOL TARTRATE 25 MG PO TABS: 12.5000 mg | ORAL_TABLET | Freq: Two times a day (BID) | ORAL | Status: DC

## 2015-06-17 NOTE — Progress Notes (Signed)
Urine drug screen is inconsistent.  She reported taking methadone night before test and no drug or metabolite present.

## 2015-06-17 NOTE — Telephone Encounter (Signed)
Mrs. Savacool UDS is inconsistent, no metabolites of methadone. We have been weaning Mrs. Goldbach Methadone for the last few months. Also she forgets to bring her medication bottles to the office visits. I have reached out to her husband Mr. Yeck and her daughter to assist Mrs. Ronnald Ramp. This was discussed with Dr. Naaman Plummer today. We will continue weaning the methadone as follows and then discontinued. Methadone 5 mg BID for two weeks then 5 mg for two weeks and discontinued. Also we will began weaning her Hydrocodone as follows Her Hydrocodone will be decreased to Q 8 hours and will continue the weaning process for several months. This will be discussed with Mrs. Kerman when she comes for her appointment.

## 2015-06-26 ENCOUNTER — Other Ambulatory Visit: Payer: Self-pay | Admitting: Registered Nurse

## 2015-06-26 ENCOUNTER — Other Ambulatory Visit: Payer: Self-pay | Admitting: Physical Medicine & Rehabilitation

## 2015-07-01 ENCOUNTER — Encounter: Payer: Self-pay | Admitting: Registered Nurse

## 2015-07-01 ENCOUNTER — Encounter: Payer: Medicare Other | Attending: Registered Nurse | Admitting: Registered Nurse

## 2015-07-01 VITALS — BP 156/82 | HR 97

## 2015-07-01 DIAGNOSIS — M25551 Pain in right hip: Secondary | ICD-10-CM | POA: Insufficient documentation

## 2015-07-01 DIAGNOSIS — M79604 Pain in right leg: Secondary | ICD-10-CM | POA: Insufficient documentation

## 2015-07-01 DIAGNOSIS — IMO0001 Reserved for inherently not codable concepts without codable children: Secondary | ICD-10-CM

## 2015-07-01 DIAGNOSIS — M545 Low back pain: Secondary | ICD-10-CM | POA: Diagnosis not present

## 2015-07-01 DIAGNOSIS — M25552 Pain in left hip: Secondary | ICD-10-CM | POA: Diagnosis not present

## 2015-07-01 DIAGNOSIS — G8929 Other chronic pain: Secondary | ICD-10-CM | POA: Insufficient documentation

## 2015-07-01 DIAGNOSIS — M79605 Pain in left leg: Secondary | ICD-10-CM | POA: Diagnosis not present

## 2015-07-01 DIAGNOSIS — M797 Fibromyalgia: Secondary | ICD-10-CM | POA: Diagnosis not present

## 2015-07-01 DIAGNOSIS — M170 Bilateral primary osteoarthritis of knee: Secondary | ICD-10-CM

## 2015-07-01 DIAGNOSIS — M791 Myalgia: Secondary | ICD-10-CM

## 2015-07-01 DIAGNOSIS — M609 Myositis, unspecified: Secondary | ICD-10-CM

## 2015-07-01 DIAGNOSIS — M542 Cervicalgia: Secondary | ICD-10-CM | POA: Insufficient documentation

## 2015-07-01 DIAGNOSIS — G894 Chronic pain syndrome: Secondary | ICD-10-CM

## 2015-07-01 DIAGNOSIS — Z79899 Other long term (current) drug therapy: Secondary | ICD-10-CM

## 2015-07-01 DIAGNOSIS — Z760 Encounter for issue of repeat prescription: Secondary | ICD-10-CM | POA: Insufficient documentation

## 2015-07-01 DIAGNOSIS — M62830 Muscle spasm of back: Secondary | ICD-10-CM | POA: Diagnosis not present

## 2015-07-01 DIAGNOSIS — Z5181 Encounter for therapeutic drug level monitoring: Secondary | ICD-10-CM

## 2015-07-01 MED ORDER — HYDROCODONE-ACETAMINOPHEN 10-325 MG PO TABS: 1.0000 | ORAL_TABLET | Freq: Three times a day (TID) | ORAL | Status: DC | PRN

## 2015-07-01 NOTE — Patient Instructions (Addendum)
Starting On February 23 rd Follow these Instructions for the weaning of Methadone  From February  23 rd to March 8th, 2017 Follow These Instructions  Take 5 mg tablet ( 1/2 Tablet)  in the morning and  Half tablet ( 5 mg)  in the evening   Starting on March 9th till March 22 nd . Follow these Instructions.  Take Methadone 5 mg tablet ( 1/2)  daily for  Two weeks.

## 2015-07-01 NOTE — Progress Notes (Signed)
Subjective:    Patient ID: Morgan Wilkerson, female    DOB: 1956/01/16, 60 y.o.   MRN: [id number]  HPI: [name] is a 60 year old female who returns for follow up for chronic pain and medication refill. She says her pain is located in her bilateral shoulder's,mid-lower back, bilateral knees and bilateral lower extremities. Also states she has a headache at this time. We reviewed her UDS, and will continue weaning her off the methadone instructions given. Also the  hydrocodone will be changed to every 8 hours. She verbalizes understanding. Morgan Wilkerson had methadone in a gabapentin bottle. A total of 44.5 tablets of methadone was discontinued per policy. She verbalizes understanding.She rates her pain 9. Her current exercise regime is performing stretching exercises using bands and walking.   Pain Inventory Average Pain 10 Pain Right Now 9 My pain is constant, sharp, stabbing and aching  In the last 24 hours, has pain interfered with the following? General activity 0 Relation with others 10 Enjoyment of life 10 What TIME of day is your pain at its worst? na Sleep (in general) Fair  Pain is worse with: walking, bending, sitting, inactivity and standing Pain improves with: pacing activities Relief from Meds: na  Mobility walk with assistance ability to climb steps?  no do you drive?  no  Function disabled: date disabled . I need assistance with the following:  bathing, meal prep, household duties and shopping  Neuro/Psych weakness trouble walking dizziness confusion anxiety  Prior Studies Any changes since last visit?  no  Physicians involved in your care Any changes since last visit?  no   Family History  Problem Relation Age of Onset  . Hypertension Mother   . Cerebral aneurysm Maternal Aunt   . Early death Father 4    Raod accident  . Healthy Sister   . Healthy Brother   . Healthy Daughter   . Cerebral aneurysm Maternal Grandfather   . Cerebral aneurysm  Maternal Aunt   . Healthy Sister   . Healthy Sister   . Healthy Sister   . Healthy Sister   . Healthy Brother   . Healthy Brother   . Healthy Grandchild    Social History   Social History  . Marital Status: Married    Spouse Name: N/A  . Number of Children: N/A  . Years of Education: 14   Occupational History  .     Social History Main Topics  . Smoking status: Never Smoker   . Smokeless tobacco: Never Used  . Alcohol Use: No     Comment: Rarely.   . Drug Use: No  . Sexual Activity: Not Currently   Other Topics Concern  . None   Social History Narrative   Patients phone number is [phone]   Past Surgical History  Procedure Laterality Date  . Temporal artery biopsy / ligation Left 6/09    Negative for temperal arteritis  . Cystoscopy      With hydrolic bladder distension  . Vaginal hysterectomy  2003    with oophorectomy, for dysfunctional uterine bleeding  . Cholecystectomy      for chronic acalculus cholecystitis  . Tubal ligation     Past Medical History  Diagnosis Date  . Essential hypertension   . Toe fracture 2001/2002    Left pinky toe  . Gastritis     Chronic, per biopsy  . Depression   . Seasonal allergic rhinitis     Spring time  .  Gastroesophageal reflux disease   . Anxiety     Panic disorder  . Anemia     Anemia of chronic disease  . Fibromyalgia   . Chronic pain syndrome   . Osteopenia     DEXA 05/06/2008: L spine T -1.0, Left femur T -0.9, Right femur T -1.1  . Inappropriate sinus tachycardia ([place])   . Refusal of blood transfusions as patient is Jehovah's Witness   . Type II diabetes mellitus ([place]) 6/09  . Migraine     "q 2-3 months" (10/15/2013)  . Osteoarthritis of both knees     Tricompartmental, worse in lateral compartment  . Osteoarthritis of both shoulders     Mild  . Chronic back pain    BP 151/80 mmHg  Pulse 118  SpO2 99%  LMP 03/08/2002  Opioid Risk Score:   Fall Risk Score:  `1  Depression screen PHQ  2/9  Depression screen [facility] 2/9 07/01/2015 06/04/2015 01/14/2015 12/01/2014 06/25/2014 10/15/2013 10/04/2013  Decreased Interest 3 3 3  0 0 3 0  Down, Depressed, Hopeless 3 3 3 3 1 3  0  PHQ - 2 Score 6 6 6 3 1 6  0  Altered sleeping - - - 3 - 3 0  Tired, decreased energy - - - 3 - 3 0  Change in appetite - - - 3 - 3 0  Feeling bad or failure about yourself  - - - 3 - 3 0  Trouble concentrating - - - 3 - 0 0  Moving slowly or fidgety/restless - - - 3 - 3 0  Suicidal thoughts - - - 0 - 3 -  PHQ-9 Score - - - 21 - 24 0     Review of Systems  Constitutional: Positive for appetite change and unexpected weight change.  Respiratory: Positive for apnea and shortness of breath.   Gastrointestinal: Positive for nausea, abdominal pain and constipation.  Neurological: Positive for dizziness and headaches.  All other systems reviewed and are negative.      Objective:   Physical Exam  Constitutional: She is oriented to person, place, and time. She appears well-developed and well-nourished.  HENT:  Head: Normocephalic and atraumatic.  Neck: Normal range of motion. Neck supple.  Cardiovascular: Normal rate and regular rhythm.   Pulmonary/Chest: Effort normal and breath sounds normal.  Musculoskeletal:  Normal Muscle Bulk and Muscle Testing Reveals: Upper Extremities: Full ROM and Muscle Strength 5/5 Thoracic Paraspinal Tenderness: T-11- T-12 Lumbar Paraspinal Tenderness: L-3-L-5 Lower Extremities: Right: Lower Extremities Full ROM and Muscle Strength 5/5 Right Lower Extremity Flexion Produces Pain into Patella Left: Lower Extremity Flexion Produces into Left Hip and Lumbar  Arises from chair slowly using crutches for support Narrow Based Gait  Neurological: She is alert and oriented to person, place, and time.  Skin: Skin is warm and dry.  Psychiatric: She has a normal mood and affect.  Nursing note and vitals reviewed.         Assessment & Plan:  1.Fibromyalgia syndrome: Continue with  exercise, heat and ice therapy.  2. 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 3. Myofascial shoulder pain: Continue Current Medication Regime.  4. Depression: On Effexor. Continue to monitor.  5. History of insomnia. No Complaints Today. Continue to monitor.  6. Osteoarthritis of the knees: Continue to Monitor.  45 minutes of face to face patient care time was spent during this  visit. All questions were encouraged and answered.

## 2015-07-08 ENCOUNTER — Encounter: Payer: Self-pay | Admitting: Internal Medicine

## 2015-07-08 ENCOUNTER — Ambulatory Visit (INDEPENDENT_AMBULATORY_CARE_PROVIDER_SITE_OTHER): Payer: Medicare Other | Admitting: Internal Medicine

## 2015-07-08 VITALS — BP 151/80 | HR 114 | Temp 98.0°F | Ht 63.5 in | Wt 188.2 lb

## 2015-07-08 DIAGNOSIS — M797 Fibromyalgia: Secondary | ICD-10-CM

## 2015-07-08 DIAGNOSIS — I10 Essential (primary) hypertension: Secondary | ICD-10-CM

## 2015-07-08 DIAGNOSIS — E119 Type 2 diabetes mellitus without complications: Secondary | ICD-10-CM

## 2015-07-08 DIAGNOSIS — R000 Tachycardia, unspecified: Secondary | ICD-10-CM | POA: Diagnosis not present

## 2015-07-08 DIAGNOSIS — Z79899 Other long term (current) drug therapy: Secondary | ICD-10-CM

## 2015-07-08 DIAGNOSIS — I4711 Inappropriate sinus tachycardia, so stated: Secondary | ICD-10-CM

## 2015-07-08 DIAGNOSIS — Z0000 Encounter for general adult medical examination without abnormal findings: Secondary | ICD-10-CM

## 2015-07-08 LAB — POCT GLYCOSYLATED HEMOGLOBIN (HGB A1C): Hemoglobin A1C: 6.2

## 2015-07-08 LAB — GLUCOSE, CAPILLARY: Glucose-Capillary: 120 mg/dL — ABNORMAL HIGH (ref 65–99)

## 2015-07-08 MED ORDER — GABAPENTIN 100 MG PO CAPS: 100.0000 mg | ORAL_CAPSULE | Freq: Three times a day (TID) | ORAL | Status: DC | PRN

## 2015-07-08 MED ORDER — METOPROLOL TARTRATE 25 MG PO TABS: 12.5000 mg | ORAL_TABLET | Freq: Two times a day (BID) | ORAL | Status: DC

## 2015-07-08 MED ORDER — METOPROLOL TARTRATE 25 MG PO TABS: 25.0000 mg | ORAL_TABLET | Freq: Two times a day (BID) | ORAL | Status: DC

## 2015-07-08 MED ORDER — VENLAFAXINE HCL 75 MG PO TABS
ORAL_TABLET | ORAL | Status: DC
Start: 2015-07-08 — End: 2016-01-19

## 2015-07-08 NOTE — Patient Instructions (Signed)
Start taking metoprolol 25mg  twice a day for your blood pressure and heart rate.

## 2015-07-09 LAB — MICROALBUMIN / CREATININE URINE RATIO
Creatinine, Urine: 152 mg/dL
MICROALB/CREAT RATIO: 27.9 mg/g creat (ref 0.0–30.0)
Microalbumin, Urine: 42.4 ug/mL

## 2015-07-09 LAB — TSH: TSH: 1.42 u[IU]/mL (ref 0.450–4.500)

## 2015-07-09 NOTE — Assessment & Plan Note (Signed)
Referral placed for eye exam and mammogram. Will address colonoscopy referral at next visit in 6 months.

## 2015-07-09 NOTE — Assessment & Plan Note (Signed)
Pt has hx of sinus tachycardia and is on metoprolol 12.5mg  BID for this. Review of old EKGs reveals sinus tach. She is asymptomatic. Hgb minimally low at 11.2, TSH in 2010 WNL, she appears euvolemic, and denies signs of infection including fevers.   - repeat TSH this visit WNL - increased metoprolol to 25mg  BID.

## 2015-07-09 NOTE — Progress Notes (Signed)
**Note Morgan-Identified via Obfuscation** Subjective:   [name]   DOB: May 03, 1956 60 y.o.   MRN: [id number]  HPI: [name] is a 60 y.o. with past medical history as outlined below who presents to clinic for medication refills.   Please see problem list for status of the pt's chronic medical problems.  Past Medical History  Diagnosis Date  . Essential hypertension   . Toe fracture 2001/2002    Left pinky toe  . Gastritis     Chronic, per biopsy  . Depression   . Seasonal allergic rhinitis     [place]  . Gastroesophageal reflux disease   . Anxiety     Panic disorder  . Anemia     Anemia of chronic disease  . Fibromyalgia   . Chronic pain syndrome   . Osteopenia     DEXA 05/06/2008: L spine T -1.0, Left femur T -0.9, Right femur T -1.1  . Inappropriate sinus tachycardia ([place])   . Refusal of blood transfusions as patient is Jehovah's Witness   . Type II diabetes mellitus ([place]) 6/09  . Migraine     "q 2-3 months" (10/15/2013)  . Osteoarthritis of both knees     Tricompartmental, worse in lateral compartment  . Osteoarthritis of both shoulders     Mild  . Chronic back pain    Current Outpatient Prescriptions  Medication Sig Dispense Refill  . ACCU-CHEK FASTCLIX LANCETS MISC 1 each by Does not apply route daily. Check blood sugar once per day. 102 each 12  . baclofen (LIORESAL) 10 MG tablet TAKE ONE TABLET BY MOUTH TWICE DAILY 60 tablet 2  . Blood Glucose Monitoring Suppl (ACCU-CHEK NANO SMARTVIEW) W/DEVICE KIT 1 kit by Does not apply route daily. Check blood sugar once a day. 1 kit 0  . diphenhydrAMINE (BENADRYL) 25 mg capsule Take 1 capsule (25 mg total) by mouth every 12 (twelve) hours as needed for itching or allergies. 14 capsule 0  . gabapentin (NEURONTIN) 100 MG capsule Take 1 capsule (100 mg total) by mouth 3 (three) times daily as needed. 90 capsule 6  . glucose blood (ACCU-CHEK SMARTVIEW) test strip Check blood sugar once a day. 50 each 12  . guaiFENesin (MUCINEX) 600 MG  12 hr tablet Take 600 mg by mouth 2 (two) times daily as needed for cough or to loosen phlegm.    Marland Kitchen HYDROcodone-acetaminophen (NORCO) 10-325 MG tablet Take 1 tablet by mouth every 8 (eight) hours as needed. For pain 90 tablet 0  . ketotifen (ZADITOR) 0.025 % ophthalmic solution Place 1 drop into both eyes as needed (dry eyes).    . meclizine (ANTIVERT) 25 MG tablet Take 1 tablet (25 mg total) by mouth 3 (three) times daily as needed for dizziness or nausea. 30 tablet 0  . metoprolol tartrate (LOPRESSOR) 25 MG tablet Take 1 tablet (25 mg total) by mouth 2 (two) times daily. 60 tablet 6  . venlafaxine (EFFEXOR) 75 MG tablet 3 tablets every morning and 2 tablets at bedtime 450 tablet 6   No current facility-administered medications for this visit.   Family History  Problem Relation Age of Onset  . Hypertension Mother   . Cerebral aneurysm Maternal Aunt   . Early death Father 52    Raod accident  . Healthy Sister   . Healthy Brother   . Healthy Daughter   . Cerebral aneurysm Maternal Grandfather   . Cerebral aneurysm Maternal Aunt   . Healthy Sister   . Healthy  Sister   . Healthy Sister   . Healthy Sister   . Healthy Brother   . Healthy Brother   . Healthy Grandchild    Social History   Social History  . Marital Status: Married    Spouse Name: N/A  . Number of Children: N/A  . Years of Education: 14   Occupational History  .     Social History Main Topics  . Smoking status: Never Smoker   . Smokeless tobacco: Never Used  . Alcohol Use: No     Comment: Rarely.   . Drug Use: No  . Sexual Activity: Not Currently   Other Topics Concern  . None   Social History Narrative   Patients phone number is [phone]   Review of Systems: Review of Systems  Constitutional: Negative for fever and weight loss.  Gastrointestinal: Positive for heartburn (stable).  Musculoskeletal: Positive for myalgias (chronic) and back pain (chronic). Negative for falls.  Neurological: Negative  for dizziness and weakness.  Psychiatric/Behavioral: Positive for depression (states mood is stable w/ meds).    Objective:  Physical Exam: Filed Vitals:   07/08/15 1536  BP: 151/80  Pulse: 114  Temp: 98 F (36.7 C)  TempSrc: Oral  Height: 5' 3.5" (1.613 m)  Weight: 188 lb 3.2 oz (85.367 kg)  SpO2: 100%   Physical Exam  Constitutional: She appears well-developed and well-nourished.  HENT:  Head: Normocephalic and atraumatic.  Eyes: Conjunctivae and EOM are normal. Right eye exhibits no discharge. Left eye exhibits no discharge. No scleral icterus.  Cardiovascular: Regular rhythm and normal heart sounds.  Tachycardia present.  Exam reveals no gallop and no friction rub.   No murmur heard. Pulmonary/Chest: Effort normal and breath sounds normal. No respiratory distress. She has no wheezes. She has no rales.  Abdominal: Soft. Bowel sounds are normal. She exhibits no distension and no mass. There is no tenderness. There is no rebound and no guarding.  Skin: Skin is warm and dry.    Assessment & Plan:   Please see problem based assessment and plan.

## 2015-07-09 NOTE — Assessment & Plan Note (Signed)
Stable, follows w/ PMR for pain meds. Also on venlafaxine and gabapentin which were refilled this visit.

## 2015-07-09 NOTE — Assessment & Plan Note (Signed)
BP Readings from Last 3 Encounters:  07/08/15 151/80  07/01/15 156/82  05/28/15 144/90    Lab Results  Component Value Date   NA 138 05/28/2015   K 3.8 05/28/2015   CREATININE 0.92 05/28/2015    Assessment: Blood pressure control:   elevated Progress toward BP goal:   near goal Comments: on metoprolol 12.5mg  BID  Plan: Medications: increased BB to 25mg  BID. Educational resources provided:   Self management tools provided:   Other plans: If BP elevated at next visit will start an [place]. Can also consider titrating BB up for rate control if needed.

## 2015-07-09 NOTE — Progress Notes (Signed)
Medicine attending: Medical history, presenting problems, physical findings, and medications, reviewed with resident physician Dr Diana Truong on the day of the patient visit and I concur with her evaluation and management plan. 

## 2015-07-09 NOTE — Assessment & Plan Note (Signed)
Lab Results  Component Value Date   HGBA1C 6.2 07/08/2015   HGBA1C 5.8 06/25/2014   HGBA1C 5.5 10/15/2013     Assessment: Diabetes control:  controlled Progress toward A1C goal:   at goal Comments: diet controlled  Plan: Medications:  continue current medications Home glucose monitoring: Frequency:   Timing:   Instruction/counseling given: reminded to get eye exam Educational resources provided:   Self management tools provided:   Other plans: hgba1c's have been well controlled in the past w/ diet alone. Can check hbga1c's biannually. Foot exam done today which was WNL. Urine microalbumin/cr level WNL this visit. If BP elevated next visit can consider starting on ACEinh. Referral placed for eye exam.

## 2015-07-23 ENCOUNTER — Encounter: Payer: Self-pay | Admitting: Registered Nurse

## 2015-07-23 ENCOUNTER — Encounter: Payer: Medicare Other | Attending: Registered Nurse | Admitting: Registered Nurse

## 2015-07-23 VITALS — BP 161/104 | HR 103 | Resp 15

## 2015-07-23 DIAGNOSIS — Z760 Encounter for issue of repeat prescription: Secondary | ICD-10-CM | POA: Insufficient documentation

## 2015-07-23 DIAGNOSIS — M542 Cervicalgia: Secondary | ICD-10-CM | POA: Insufficient documentation

## 2015-07-23 DIAGNOSIS — I10 Essential (primary) hypertension: Secondary | ICD-10-CM

## 2015-07-23 DIAGNOSIS — M791 Myalgia: Secondary | ICD-10-CM | POA: Diagnosis not present

## 2015-07-23 DIAGNOSIS — M25552 Pain in left hip: Secondary | ICD-10-CM | POA: Insufficient documentation

## 2015-07-23 DIAGNOSIS — M797 Fibromyalgia: Secondary | ICD-10-CM | POA: Diagnosis not present

## 2015-07-23 DIAGNOSIS — M545 Low back pain: Secondary | ICD-10-CM | POA: Insufficient documentation

## 2015-07-23 DIAGNOSIS — M25551 Pain in right hip: Secondary | ICD-10-CM | POA: Insufficient documentation

## 2015-07-23 DIAGNOSIS — M609 Myositis, unspecified: Secondary | ICD-10-CM

## 2015-07-23 DIAGNOSIS — M79605 Pain in left leg: Secondary | ICD-10-CM | POA: Diagnosis not present

## 2015-07-23 DIAGNOSIS — M170 Bilateral primary osteoarthritis of knee: Secondary | ICD-10-CM

## 2015-07-23 DIAGNOSIS — G8929 Other chronic pain: Secondary | ICD-10-CM | POA: Insufficient documentation

## 2015-07-23 DIAGNOSIS — M79604 Pain in right leg: Secondary | ICD-10-CM | POA: Diagnosis not present

## 2015-07-23 DIAGNOSIS — G894 Chronic pain syndrome: Secondary | ICD-10-CM

## 2015-07-23 DIAGNOSIS — IMO0001 Reserved for inherently not codable concepts without codable children: Secondary | ICD-10-CM

## 2015-07-23 DIAGNOSIS — M62830 Muscle spasm of back: Secondary | ICD-10-CM | POA: Diagnosis not present

## 2015-07-23 NOTE — Progress Notes (Signed)
Subjective:    Patient ID: Morgan Wilkerson, female    DOB: 08-25-1955, 60 y.o.   MRN: [id number]  HPI: Morgan Wilkerson is a 60 year old female who returns for follow up for chronic pain and medication refill. She states her pain is located in her bilateral shoulder's, lower back,  bilateral lower extremities and bilateral knee's. She rates her pain 9. Her current exercise regime is performing stretching exercises using bands and walking. She uses her crutches when she goes outdoors she states. Morgan Wilkerson brought in an empty Hydrocodone bottle dated 07/14/2015, she states she left medication on her table she was counting her tablets. According to [facility] prescription was dated 07/01/15 and filled 07/14/15, Morgan Wilkerson realizes she must bring in her medication before a prescription will be issued, she verbalizes understanding. Morgan Wilkerson arrived to office hypertensive blood pressure re-checked twice 154/103 and 161/104. She states she is compliant with her antihypertensives, she refuses ED evaluation.  Pain Inventory Average Pain 8 Pain Right Now 9 My pain is constant, sharp, stabbing and aching  In the last 24 hours, has pain interfered with the following? General activity 4 Relation with others 8 Enjoyment of life 8 What TIME of day is your pain at its worst? daytime, evening, night Sleep (in general) NA  Pain is worse with: walking, bending, sitting, inactivity, standing and some activites Pain improves with: rest, heat/ice, therapy/exercise, pacing activities and medication Relief from Meds: 5  Mobility how many minutes can you walk? 10-20 ability to climb steps?  no do you drive?  no use a wheelchair Do you have any goals in this area?  no  Function I need assistance with the following:  dressing, bathing, meal prep, household duties and shopping  Neuro/Psych weakness numbness tremor tingling spasms dizziness confusion depression anxiety  Prior Studies Any changes since  last visit?  no  Physicians involved in your care Any changes since last visit?  no   Family History  Problem Relation Age of Onset  . Hypertension Mother   . Cerebral aneurysm Maternal Aunt   . Early death Father 50    Raod accident  . Healthy Sister   . Healthy Brother   . Healthy Daughter   . Cerebral aneurysm Maternal Grandfather   . Cerebral aneurysm Maternal Aunt   . Healthy Sister   . Healthy Sister   . Healthy Sister   . Healthy Sister   . Healthy Brother   . Healthy Brother   . Healthy Grandchild    Social History   Social History  . Marital Status: Married    Spouse Name: N/A  . Number of Children: N/A  . Years of Education: 14   Occupational History  .     Social History Main Topics  . Smoking status: Never Smoker   . Smokeless tobacco: Never Used  . Alcohol Use: No     Comment: Rarely.   . Drug Use: No  . Sexual Activity: Not Currently   Other Topics Concern  . None   Social History Narrative   Patients phone number is [phone]   Past Surgical History  Procedure Laterality Date  . Temporal artery biopsy / ligation Left 6/09    Negative for temperal arteritis  . Cystoscopy      With hydrolic bladder distension  . Vaginal hysterectomy  2003    with oophorectomy, for dysfunctional uterine bleeding  . Cholecystectomy      for chronic acalculus cholecystitis  .  Tubal ligation     Past Medical History  Diagnosis Date  . Essential hypertension   . Toe fracture 2001/2002    Left pinky toe  . Gastritis     Chronic, per biopsy  . Depression   . Seasonal allergic rhinitis     [place]  . Gastroesophageal reflux disease   . Anxiety     Panic disorder  . Anemia     Anemia of chronic disease  . Fibromyalgia   . Chronic pain syndrome   . Osteopenia     DEXA 05/06/2008: L spine T -1.0, Left femur T -0.9, Right femur T -1.1  . Inappropriate sinus tachycardia ([place])   . Refusal of blood transfusions as patient is Jehovah's Witness   .  Type II diabetes mellitus ([place]) 6/09  . Migraine     "q 2-3 months" (10/15/2013)  . Osteoarthritis of both knees     Tricompartmental, worse in lateral compartment  . Osteoarthritis of both shoulders     Mild  . Chronic back pain    BP 173/100 mmHg  Pulse 111  Resp 15  SpO2 99%  LMP 03/08/2002  Opioid Risk Score:   Fall Risk Score:  `1  Depression screen PHQ 2/9  Depression screen [facility] 2/9 07/08/2015 07/01/2015 06/04/2015 01/14/2015 12/01/2014 06/25/2014 10/15/2013  Decreased Interest 0 3 3 3  0 0 3  Down, Depressed, Hopeless 0 3 3 3 3 1 3   PHQ - 2 Score 0 6 6 6 3 1 6   Altered sleeping - - - - 3 - 3  Tired, decreased energy - - - - 3 - 3  Change in appetite - - - - 3 - 3  Feeling bad or failure about yourself  - - - - 3 - 3  Trouble concentrating - - - - 3 - 0  Moving slowly or fidgety/restless - - - - 3 - 3  Suicidal thoughts - - - - 0 - 3  PHQ-9 Score - - - - 21 - 24     Review of Systems  Constitutional: Positive for diaphoresis.  Respiratory: Positive for cough, shortness of breath and wheezing.   Cardiovascular:       Limb swelling   Gastrointestinal: Positive for nausea, abdominal pain and constipation.  Endocrine:       High blood sugar Low blood sugar  Skin: Positive for rash.  Neurological: Positive for dizziness, tremors, weakness and numbness.       Tingling Spasms   Psychiatric/Behavioral: Positive for confusion and dysphoric mood. The patient is nervous/anxious.   All other systems reviewed and are negative.      Objective:   Physical Exam  Constitutional: She is oriented to person, place, and time. She appears well-developed and well-nourished.  HENT:  Head: Normocephalic and atraumatic.  Neck: Normal range of motion. Neck supple.  Cardiovascular: Normal rate and regular rhythm.   Pulmonary/Chest: Effort normal and breath sounds normal.  Musculoskeletal:  Normal Muscle Bulk and Muscle Testing Reveals: Upper Extremities: Full ROM and Muscle Strength  5/5 Thoracic Paraspinal Tenderness: T-7- T-9 Lumbar Paraspinal Tenderness: L-1- L-4 Lower Extremities: Full ROM and Muscle Strength 5/5 Arises from chair slowly using crutches for support Narrow Based Gait  Neurological: She is alert and oriented to person, place, and time.  Skin: Skin is warm and dry.  Psychiatric: She has a normal mood and affect.  Nursing note and vitals reviewed.         Assessment & Plan:  1.  Fibromyalgia syndrome: Continue with exercise, heat and ice therapy.  2. 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 Methadone has been weaned off. 3. Myofascial shoulder pain: Continue Current Medication Regime.Continue HEP 4. Depression: On Effexor. Continue to monitor.  5. History of insomnia. No Complaints Today. Continue to monitor.  6. Osteoarthritis of the knees: Continue to Monitor 7. Uncontrolled HTN: Refused ED evaluation  20 minutes of face to face patient care time was spent during this visit. All questions were encouraged and answered.

## 2015-07-29 ENCOUNTER — Ambulatory Visit: Payer: Self-pay | Admitting: Registered Nurse

## 2015-08-03 ENCOUNTER — Telehealth: Payer: Self-pay | Admitting: Registered Nurse

## 2015-08-03 DIAGNOSIS — G894 Chronic pain syndrome: Secondary | ICD-10-CM

## 2015-08-03 DIAGNOSIS — M797 Fibromyalgia: Secondary | ICD-10-CM

## 2015-08-03 MED ORDER — HYDROCODONE-ACETAMINOPHEN 10-325 MG PO TABS: 1.0000 | ORAL_TABLET | Freq: Two times a day (BID) | ORAL | Status: DC | PRN

## 2015-08-03 NOTE — Telephone Encounter (Signed)
Mr. Mapes brought in Mrs.Zebedee Iba Hydrocodone. Hydrocodone 10/325 mg was filled on 07/14/2015 #90. She has 35 tablets today. Mr. Snover signed the form as office protocol. Hydrocodone 10/325 mg one tablet every 12 hours was prescribed today #60. Mr. Amster was given the prescription today.  Mr. Abadie was educated again regarding  Mrs, Schwantz Hydrocodone is being weaned off, he verbalizes understanding.   Mrs. Butcher scheduled appointment is on 09/01/2015

## 2015-08-17 NOTE — Addendum Note (Signed)
Addended by: Hulan Fray on: 08/17/2015 05:55 PM   Modules accepted: Orders

## 2015-08-24 ENCOUNTER — Other Ambulatory Visit: Payer: Self-pay | Admitting: Internal Medicine

## 2015-08-25 ENCOUNTER — Telehealth: Payer: Self-pay | Admitting: *Deleted

## 2015-08-25 NOTE — Telephone Encounter (Signed)
Morgan Wilkerson called for refill on her effexor.  She has refills through her PCP.

## 2015-09-01 ENCOUNTER — Encounter: Payer: Medicare Other | Attending: Registered Nurse | Admitting: Registered Nurse

## 2015-09-01 ENCOUNTER — Encounter: Payer: Self-pay | Admitting: Registered Nurse

## 2015-09-01 VITALS — BP 164/90 | HR 102 | Resp 14

## 2015-09-01 DIAGNOSIS — M62830 Muscle spasm of back: Secondary | ICD-10-CM | POA: Diagnosis not present

## 2015-09-01 DIAGNOSIS — M609 Myositis, unspecified: Secondary | ICD-10-CM

## 2015-09-01 DIAGNOSIS — M797 Fibromyalgia: Secondary | ICD-10-CM | POA: Diagnosis not present

## 2015-09-01 DIAGNOSIS — M791 Myalgia: Secondary | ICD-10-CM

## 2015-09-01 DIAGNOSIS — M79605 Pain in left leg: Secondary | ICD-10-CM | POA: Diagnosis not present

## 2015-09-01 DIAGNOSIS — M25551 Pain in right hip: Secondary | ICD-10-CM | POA: Insufficient documentation

## 2015-09-01 DIAGNOSIS — Z760 Encounter for issue of repeat prescription: Secondary | ICD-10-CM | POA: Insufficient documentation

## 2015-09-01 DIAGNOSIS — G8929 Other chronic pain: Secondary | ICD-10-CM | POA: Insufficient documentation

## 2015-09-01 DIAGNOSIS — M545 Low back pain: Secondary | ICD-10-CM | POA: Insufficient documentation

## 2015-09-01 DIAGNOSIS — M542 Cervicalgia: Secondary | ICD-10-CM | POA: Diagnosis not present

## 2015-09-01 DIAGNOSIS — Z5181 Encounter for therapeutic drug level monitoring: Secondary | ICD-10-CM

## 2015-09-01 DIAGNOSIS — M79604 Pain in right leg: Secondary | ICD-10-CM | POA: Diagnosis not present

## 2015-09-01 DIAGNOSIS — Z79899 Other long term (current) drug therapy: Secondary | ICD-10-CM

## 2015-09-01 DIAGNOSIS — M25552 Pain in left hip: Secondary | ICD-10-CM | POA: Insufficient documentation

## 2015-09-01 DIAGNOSIS — G894 Chronic pain syndrome: Secondary | ICD-10-CM | POA: Diagnosis not present

## 2015-09-01 DIAGNOSIS — M170 Bilateral primary osteoarthritis of knee: Secondary | ICD-10-CM

## 2015-09-01 DIAGNOSIS — IMO0001 Reserved for inherently not codable concepts without codable children: Secondary | ICD-10-CM

## 2015-09-01 MED ORDER — HYDROCODONE-ACETAMINOPHEN 10-325 MG PO TABS: 1.0000 | ORAL_TABLET | Freq: Two times a day (BID) | ORAL | Status: DC | PRN

## 2015-09-01 NOTE — Progress Notes (Signed)
Subjective:    Patient ID: Morgan Wilkerson, female    DOB: Jan 12, 1956, 60 y.o.   MRN: [id number]  HPI: [name] is a 60 year old female who returns for follow up for chronic pain and medication refill. She states her pain is located in her bilateral shoulder's, entire back, bilateral lower extremities and bilateral knee's. She rates her pain 8. Also states her pain has intensified with the weaning of the hydrocodone. I will speak with Dr. Naaman Plummer in the morning she verbalizes understanding, no prescription given today.Her current exercise regime is performing stretching exercises using bands and walking.    Pain Inventory Average Pain 10 Pain Right Now 8 My pain is intermittent, constant, sharp, stabbing, tingling and aching  In the last 24 hours, has pain interfered with the following? General activity 9 Relation with others 8 Enjoyment of life 8 What TIME of day is your pain at its worst? all Sleep (in general) Fair  Pain is worse with: walking, bending, sitting, inactivity, standing and some activites Pain improves with: pacing activities and medication Relief from Meds: 7  Mobility walk with assistance how many minutes can you walk? 15 ability to climb steps?  yes do you drive?  no use a wheelchair  Function disabled: date disabled . I need assistance with the following:  bathing, meal prep and household duties  Neuro/Psych bowel control problems weakness numbness tremor tingling trouble walking spasms dizziness confusion depression anxiety suicidal thoughts  Prior Studies Any changes since last visit?  no  Physicians involved in your care Any changes since last visit?  no   Family History  Problem Relation Age of Onset  . Hypertension Mother   . Cerebral aneurysm Maternal Aunt   . Early death Father 39    Raod accident  . Healthy Sister   . Healthy Brother   . Healthy Daughter   . Cerebral aneurysm Maternal Grandfather   . Cerebral  aneurysm Maternal Aunt   . Healthy Sister   . Healthy Sister   . Healthy Sister   . Healthy Sister   . Healthy Brother   . Healthy Brother   . Healthy Grandchild    Social History   Social History  . Marital Status: Married    Spouse Name: N/A  . Number of Children: N/A  . Years of Education: 14   Occupational History  .     Social History Main Topics  . Smoking status: Never Smoker   . Smokeless tobacco: Never Used  . Alcohol Use: No     Comment: Rarely.   . Drug Use: No  . Sexual Activity: Not Currently   Other Topics Concern  . None   Social History Narrative   Patients phone number is [phone]   Past Surgical History  Procedure Laterality Date  . Temporal artery biopsy / ligation Left 6/09    Negative for temperal arteritis  . Cystoscopy      With hydrolic bladder distension  . Vaginal hysterectomy  2003    with oophorectomy, for dysfunctional uterine bleeding  . Cholecystectomy      for chronic acalculus cholecystitis  . Tubal ligation     Past Medical History  Diagnosis Date  . Essential hypertension   . Toe fracture 2001/2002    Left pinky toe  . Gastritis     Chronic, per biopsy  . Depression   . Seasonal allergic rhinitis     Spring time  . Gastroesophageal reflux disease   .  Anxiety     Panic disorder  . Anemia     Anemia of chronic disease  . Fibromyalgia   . Chronic pain syndrome   . Osteopenia     DEXA 05/06/2008: L spine T -1.0, Left femur T -0.9, Right femur T -1.1  . Inappropriate sinus tachycardia ([place])   . Refusal of blood transfusions as patient is Jehovah's Witness   . Type II diabetes mellitus ([place]) 6/09  . Migraine     "q 2-3 months" (10/15/2013)  . Osteoarthritis of both knees     Tricompartmental, worse in lateral compartment  . Osteoarthritis of both shoulders     Mild  . Chronic back pain    BP 153/96 mmHg  Pulse 106  Resp 14  SpO2 98%  LMP 03/08/2002  Opioid Risk Score:   Fall Risk Score:  `1  Depression  screen PHQ 2/9  Depression screen [facility] 2/9 07/08/2015 07/01/2015 06/04/2015 01/14/2015 12/01/2014 06/25/2014 10/15/2013  Decreased Interest 0 3 3 3  0 0 3  Down, Depressed, Hopeless 0 3 3 3 3 1 3   PHQ - 2 Score 0 6 6 6 3 1 6   Altered sleeping - - - - 3 - 3  Tired, decreased energy - - - - 3 - 3  Change in appetite - - - - 3 - 3  Feeling bad or failure about yourself  - - - - 3 - 3  Trouble concentrating - - - - 3 - 0  Moving slowly or fidgety/restless - - - - 3 - 3  Suicidal thoughts - - - - 0 - 3  PHQ-9 Score - - - - 21 - 24    Review of Systems  Constitutional: Positive for fever, chills and diaphoresis.  Respiratory: Positive for apnea, cough, shortness of breath and wheezing.   Cardiovascular: Positive for leg swelling.  Gastrointestinal: Positive for nausea, diarrhea and constipation.  Endocrine:       High blood sugar Low blood sugar  Skin: Positive for rash.  All other systems reviewed and are negative.      Objective:   Physical Exam  Constitutional: She is oriented to person, place, and time. She appears well-developed and well-nourished.  HENT:  Head: Normocephalic and atraumatic.  Neck: Normal range of motion. Neck supple.  Cardiovascular: Normal rate and regular rhythm.   Pulmonary/Chest: Effort normal and breath sounds normal.  Musculoskeletal:  Normal Muscle Bulk and Muscle Testing Reveals: Upper Extremities: Full ROM and Muscle Strength 5/5 Thoracic and Lumbar Hypersensitivity Lower Extremities: Full ROM and Muscle Strength 5/5 Bilateral Lower Extremities Flexion Produces Pain into Bilateral Hips and lower extremities Arises from chair slowly using bilateral crutches for support Narrow Based gait  Neurological: She is alert and oriented to person, place, and time.  Skin: Skin is warm and dry.  Psychiatric: She has a normal mood and affect.  Nursing note and vitals reviewed.         Assessment & Plan:  1. Fibromyalgia syndrome: Continue with exercise, heat  and ice therapy.  2. 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 No script given. We will continue the opioid monitoring program, this consists of regular clinic visits, examinations, urine drug screen, pill counts as well as use of [place] Controlled Substance Reporting System. 3. Myofascial shoulder pain: Continue Current Medication Regime.Continue HEP 4. Depression: On Effexor. Continue to monitor.  5. History of insomnia. No Complaints  Today. Continue to monitor.  6. Osteoarthritis of the knees: Continue to Monitor 7. Muscle Spasm: Continue Baclofen  20 minutes of face to face patient care time was spent during this visit. All questions were encouraged and answered.

## 2015-09-02 ENCOUNTER — Telehealth: Payer: Self-pay | Admitting: Registered Nurse

## 2015-09-02 DIAGNOSIS — M797 Fibromyalgia: Secondary | ICD-10-CM

## 2015-09-02 DIAGNOSIS — G894 Chronic pain syndrome: Secondary | ICD-10-CM

## 2015-09-02 MED ORDER — HYDROCODONE-ACETAMINOPHEN 10-325 MG PO TABS: 1.0000 | ORAL_TABLET | Freq: Two times a day (BID) | ORAL | Status: DC | PRN

## 2015-09-02 NOTE — Telephone Encounter (Signed)
I spoke with Dr. Naaman Plummer this morning regarding Morgan Wilkerson request to increase her hydrocodone. Upon reviewing her chart for the last 6 months, their was no change in her pain level. Mr. Manninen called the office this afternoon this was relayed to him. He realizes her Hydrocodone will remain the same and Ms. Assante needs to incorporate heat and ice therapy as well as a regular schedule exercise routine. He verbalizes understanding.

## 2015-09-16 ENCOUNTER — Encounter: Payer: Self-pay | Admitting: Physical Medicine & Rehabilitation

## 2015-09-16 ENCOUNTER — Encounter: Payer: Medicare Other | Attending: Registered Nurse | Admitting: Physical Medicine & Rehabilitation

## 2015-09-16 VITALS — BP 168/105 | HR 104 | Resp 16

## 2015-09-16 DIAGNOSIS — F329 Major depressive disorder, single episode, unspecified: Secondary | ICD-10-CM

## 2015-09-16 DIAGNOSIS — I10 Essential (primary) hypertension: Secondary | ICD-10-CM | POA: Diagnosis not present

## 2015-09-16 DIAGNOSIS — M25552 Pain in left hip: Secondary | ICD-10-CM | POA: Diagnosis not present

## 2015-09-16 DIAGNOSIS — M797 Fibromyalgia: Secondary | ICD-10-CM | POA: Diagnosis not present

## 2015-09-16 DIAGNOSIS — M542 Cervicalgia: Secondary | ICD-10-CM | POA: Insufficient documentation

## 2015-09-16 DIAGNOSIS — M545 Low back pain: Secondary | ICD-10-CM | POA: Insufficient documentation

## 2015-09-16 DIAGNOSIS — G894 Chronic pain syndrome: Secondary | ICD-10-CM | POA: Diagnosis not present

## 2015-09-16 DIAGNOSIS — M79605 Pain in left leg: Secondary | ICD-10-CM | POA: Insufficient documentation

## 2015-09-16 DIAGNOSIS — G8929 Other chronic pain: Secondary | ICD-10-CM | POA: Insufficient documentation

## 2015-09-16 DIAGNOSIS — M79604 Pain in right leg: Secondary | ICD-10-CM | POA: Diagnosis not present

## 2015-09-16 DIAGNOSIS — M170 Bilateral primary osteoarthritis of knee: Secondary | ICD-10-CM

## 2015-09-16 DIAGNOSIS — F32A Depression, unspecified: Secondary | ICD-10-CM

## 2015-09-16 DIAGNOSIS — M25551 Pain in right hip: Secondary | ICD-10-CM | POA: Insufficient documentation

## 2015-09-16 DIAGNOSIS — Z760 Encounter for issue of repeat prescription: Secondary | ICD-10-CM | POA: Insufficient documentation

## 2015-09-16 MED ORDER — METOPROLOL TARTRATE 50 MG PO TABS: 50.0000 mg | ORAL_TABLET | Freq: Two times a day (BID) | ORAL | Status: DC

## 2015-09-16 MED ORDER — HYDROCODONE-ACETAMINOPHEN 10-325 MG PO TABS: 1.0000 | ORAL_TABLET | Freq: Two times a day (BID) | ORAL | Status: DC | PRN
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
## 2015-09-16 NOTE — Patient Instructions (Signed)
  PLEASE CALL ME WITH ANY PROBLEMS OR QUESTIONS (#[phone]).      

## 2015-09-22 ENCOUNTER — Telehealth: Payer: Self-pay | Admitting: Psychology

## 2015-09-22 NOTE — Telephone Encounter (Signed)
Morgan Wilkerson was recently referred to be my Dr. Naaman Plummer. Unfortunately, I am not in-network with her insurance [facility] Medicare PPO). I called Morgan Wilkerson today to tell her this. She does not wish to self-pay. Will inform MD to refer her to another provider.

## 2015-10-01 ENCOUNTER — Ambulatory Visit: Payer: Self-pay | Admitting: Registered Nurse

## 2015-10-02 ENCOUNTER — Encounter: Payer: Self-pay | Admitting: Internal Medicine

## 2015-10-02 ENCOUNTER — Ambulatory Visit: Payer: Self-pay | Admitting: Internal Medicine

## 2015-10-02 ENCOUNTER — Ambulatory Visit (INDEPENDENT_AMBULATORY_CARE_PROVIDER_SITE_OTHER): Payer: Medicare Other | Admitting: Internal Medicine

## 2015-10-02 VITALS — BP 130/80 | HR 90 | Temp 98.4°F | Ht 63.5 in | Wt 187.3 lb

## 2015-10-02 DIAGNOSIS — R296 Repeated falls: Secondary | ICD-10-CM

## 2015-10-02 DIAGNOSIS — G894 Chronic pain syndrome: Secondary | ICD-10-CM | POA: Diagnosis not present

## 2015-10-02 DIAGNOSIS — Z9181 History of falling: Secondary | ICD-10-CM | POA: Diagnosis not present

## 2015-10-02 DIAGNOSIS — E559 Vitamin D deficiency, unspecified: Secondary | ICD-10-CM | POA: Diagnosis not present

## 2015-10-02 DIAGNOSIS — I10 Essential (primary) hypertension: Secondary | ICD-10-CM

## 2015-10-02 NOTE — Assessment & Plan Note (Addendum)
BP elevated 159/77 with an increase in her metoprolol to 50mg  bid.  HR 91.  She reports compliance and denies missing doses although she said she recently "just started."  Upon recheck 130/80. -DASH diet -cont current meds of metoprolol 50mg  bid

## 2015-10-02 NOTE — Assessment & Plan Note (Addendum)
Pt p/w a fall on last Saturday.  Looking over her chart she has several recent falls.  She reports landing on her behind.  States her hands were soapy and there was a rug that she may have gotten "twisted up" in.  Of note, she on crutches.  She describes the pain as sharp in her lower back without radiation.  She has been using vicodin for pain but this is not helping.  She also has difficulty sleeping.  Denies any dizziness, palpitations, chest pain.  Denies any weakness or paresthesias.  No other red flags. She has tenderness mainly on the right side of the paraspinal muscles.  I do not feel XR is warranted at this point given no red flags.  Of note, she had an XR of the lumbar spine in January also fell at that time.  XR showed mild disc space flattening at L4-L5 and L5-S1 level. Stable mild anterior spurring upper endplate of L4. Stable mild anterior spurring upperand lower endplate of L5 vertebral body. Alignment and vertebral body heights are preserved.  Also revealed mild osteopenia.  DEXA in 2009 showed osteopenia.  She has never had VitD level checked.   -check vitD -referral for PT (states she may not have transportation, but we will refer to [facility] for help with this) -referral to SW  -she had repeat DEXA ordered but has not been done  -weight loss stressed

## 2015-10-02 NOTE — Patient Instructions (Addendum)
Thank you for your visit today.   Please return to the internal medicine clinic in about 3 months or sooner if needed.     I have made the following additions/changes to your medications:  We will refer you for physical therapy.  We will have our social worker see if we can help with transportation. Please remember to take your blood pressure every day. I have given you some information on fall prevention. I will also check your vitamin D level today as this may increase your risk of falls is low.   Please be sure to bring all of your medications with you to every visit; this includes herbal supplements, vitamins, eye drops, and any over-the-counter medications.   Should you have any questions regarding your medications and/or any new or worsening symptoms, please be sure to call the clinic at [phone].   If you believe that you are suffering from a life threatening condition or one that may result in the loss of limb or function, then you should call 911 and proceed to the nearest Emergency Department.   Fall Prevention in the Home  Falls can cause injuries and can affect people from all age groups. There are many simple things that you can do to make your home safe and to help prevent falls. WHAT CAN I DO ON THE OUTSIDE OF MY HOME?  Regularly repair the edges of walkways and driveways and fix any cracks.  Remove high doorway thresholds.  Trim any shrubbery on the main path into your home.  Use bright outdoor lighting.  Clear walkways of debris and clutter, including tools and rocks.  Regularly check that handrails are securely fastened and in good repair. Both sides of any steps should have handrails.  Install guardrails along the edges of any raised decks or porches.  Have leaves, snow, and ice cleared regularly.  Use sand or salt on walkways during winter months.  In the garage, clean up any spills right away, including grease or oil spills. WHAT CAN I DO IN THE  BATHROOM?  Use night lights.  Install grab bars by the toilet and in the tub and shower. Do not use towel bars as grab bars.  Use non-skid mats or decals on the floor of the tub or shower.  If you need to sit down while you are in the shower, use a plastic, non-slip stool.Marland Kitchen  Keep the floor dry. Immediately clean up any water that spills on the floor.  Remove soap buildup in the tub or shower on a regular basis.  Attach bath mats securely with double-sided non-slip rug tape.  Remove throw rugs and other tripping hazards from the floor. WHAT CAN I DO IN THE BEDROOM?  Use night lights.  Make sure that a bedside light is easy to reach.  Do not use oversized bedding that drapes onto the floor.  Have a firm chair that has side arms to use for getting dressed.  Remove throw rugs and other tripping hazards from the floor. WHAT CAN I DO IN THE KITCHEN?   Clean up any spills right away.  Avoid walking on wet floors.  Place frequently used items in easy-to-reach places.  If you need to reach for something above you, use a sturdy step stool that has a grab bar.  Keep electrical cables out of the way.  Do not use floor polish or wax that makes floors slippery. If you have to use wax, make sure that it is non-skid floor wax.  Remove throw rugs and other tripping hazards from the floor. WHAT CAN I DO IN THE STAIRWAYS?  Do not leave any items on the stairs.  Make sure that there are handrails on both sides of the stairs. Fix handrails that are broken or loose. Make sure that handrails are as long as the stairways.  Check any carpeting to make sure that it is firmly attached to the stairs. Fix any carpet that is loose or worn.  Avoid having throw rugs at the top or bottom of stairways, or secure the rugs with carpet tape to prevent them from moving.  Make sure that you have a light switch at the top of the stairs and the bottom of the stairs. If you do not have them, have them  installed. WHAT ARE SOME OTHER FALL PREVENTION TIPS?  Wear closed-toe shoes that fit well and support your feet. Wear shoes that have rubber soles or low heels.  When you use a stepladder, make sure that it is completely opened and that the sides are firmly locked. Have someone hold the ladder while you are using it. Do not climb a closed stepladder.  Add color or contrast paint or tape to grab bars and handrails in your home. Place contrasting color strips on the first and last steps.  Use mobility aids as needed, such as canes, walkers, scooters, and crutches.  Turn on lights if it is dark. Replace any light bulbs that burn out.  Set up furniture so that there are clear paths. Keep the furniture in the same spot.  Fix any uneven floor surfaces.  Choose a carpet design that does not hide the edge of steps of a stairway.  Be aware of any and all pets.  Review your medicines with your healthcare provider. Some medicines can cause dizziness or changes in blood pressure, which increase your risk of falling. Talk with your health care provider about other ways that you can decrease your risk of falls. This may include working with a physical therapist or trainer to improve your strength, balance, and endurance.   This information is not intended to replace advice given to you by your health care provider. Make sure you discuss any questions you have with your health care provider.   Document Released: 04/15/2002 Document Revised: 09/09/2014 Document Reviewed: 05/30/2014 Elsevier Interactive Patient Education 2016 [place] DASH stands for "Dietary Approaches to Stop Hypertension." The DASH eating plan is a healthy eating plan that has been shown to reduce high blood pressure (hypertension). Additional health benefits may include reducing the risk of type 2 diabetes mellitus, heart disease, and stroke. The DASH eating plan may also help with weight loss. WHAT DO I  NEED TO KNOW ABOUT THE DASH EATING PLAN? For the DASH eating plan, you will follow these general guidelines:  Choose foods with a percent daily value for sodium of less than 5% (as listed on the food label).  Use salt-free seasonings or herbs instead of table salt or sea salt.  Check with your health care provider or pharmacist before using salt substitutes.  Eat lower-sodium products, often labeled as "lower sodium" or "no salt added."  Eat fresh foods.  Eat more vegetables, fruits, and low-fat dairy products.  Choose whole grains. Look for the word "whole" as the first word in the ingredient list.  Choose fish and skinless chicken or [place] more often than red meat. Limit fish, poultry, and meat to 6 oz (170 g) each day.  Limit sweets, desserts, sugars, and sugary drinks.  Choose heart-healthy fats.  Limit cheese to 1 oz (28 g) per day.  Eat more home-cooked food and less restaurant, buffet, and fast food.  Limit fried foods.  Cook foods using methods other than frying.  Limit canned vegetables. If you do use them, rinse them well to decrease the sodium.  When eating at a restaurant, ask that your food be prepared with less salt, or no salt if possible. WHAT FOODS CAN I EAT? Seek help from a dietitian for individual calorie needs. Grains Whole grain or whole wheat bread. Brown rice. Whole grain or whole wheat pasta. Quinoa, bulgur, and whole grain cereals. Low-sodium cereals. Corn or whole wheat flour tortillas. Whole grain cornbread. Whole grain crackers. Low-sodium crackers. Vegetables Fresh or frozen vegetables (raw, steamed, roasted, or grilled). Low-sodium or reduced-sodium tomato and vegetable juices. Low-sodium or reduced-sodium tomato sauce and paste. Low-sodium or reduced-sodium canned vegetables.  Fruits All fresh, canned (in natural juice), or frozen fruits. Meat and Other Protein Products Ground beef (85% or leaner), grass-fed beef, or beef trimmed of fat.  Skinless chicken or [place]. Ground chicken or [place]. Pork trimmed of fat. All fish and seafood. Eggs. Dried beans, peas, or lentils. Unsalted nuts and seeds. Unsalted canned beans. Dairy Low-fat dairy products, such as skim or 1% milk, 2% or reduced-fat cheeses, low-fat ricotta or cottage cheese, or plain low-fat yogurt. Low-sodium or reduced-sodium cheeses. Fats and Oils Tub margarines without trans fats. Light or reduced-fat mayonnaise and salad dressings (reduced sodium). Avocado. Safflower, olive, or canola oils. Natural peanut or almond butter. Other Unsalted popcorn and pretzels. The items listed above may not be a complete list of recommended foods or beverages. Contact your dietitian for more options. WHAT FOODS ARE NOT RECOMMENDED? Grains White bread. White pasta. White rice. Refined cornbread. Bagels and croissants. Crackers that contain trans fat. Vegetables Creamed or fried vegetables. Vegetables in a cheese sauce. Regular canned vegetables. Regular canned tomato sauce and paste. Regular tomato and vegetable juices. Fruits Dried fruits. Canned fruit in light or heavy syrup. Fruit juice. Meat and Other Protein Products Fatty cuts of meat. Ribs, chicken wings, bacon, sausage, bologna, salami, chitterlings, fatback, hot dogs, bratwurst, and packaged luncheon meats. Salted nuts and seeds. Canned beans with salt. Dairy Whole or 2% milk, cream, half-and-half, and cream cheese. Whole-fat or sweetened yogurt. Full-fat cheeses or blue cheese. Nondairy creamers and whipped toppings. Processed cheese, cheese spreads, or cheese curds. Condiments Onion and garlic salt, seasoned salt, table salt, and sea salt. Canned and packaged gravies. Worcestershire sauce. Tartar sauce. Barbecue sauce. Teriyaki sauce. Soy sauce, including reduced sodium. Steak sauce. Fish sauce. Oyster sauce. Cocktail sauce. Horseradish. Ketchup and mustard. Meat flavorings and tenderizers. Bouillon cubes. Hot sauce. Tabasco  sauce. Marinades. Taco seasonings. Relishes. Fats and Oils Butter, stick margarine, lard, shortening, ghee, and bacon fat. Coconut, palm kernel, or palm oils. Regular salad dressings. Other Pickles and olives. Salted popcorn and pretzels. The items listed above may not be a complete list of foods and beverages to avoid. Contact your dietitian for more information. WHERE CAN I FIND MORE INFORMATION? National Heart, Lung, and Blood Institute: travelstabloid.com   This information is not intended to replace advice given to you by your health care provider. Make sure you discuss any questions you have with your health care provider.   Document Released: 04/14/2011 Document Revised: 05/16/2014 Document Reviewed: 02/27/2013 Elsevier Interactive Patient Education [employer].

## 2015-10-02 NOTE — Progress Notes (Signed)
Patient ID: Morgan Wilkerson, female   DOB: 10-02-1955, 60 y.o.   MRN: [id number]     Subjective:   [name]    DOB: [birth year] 60 y.o.    MRN: [id number] Health Maintenance Due: Health Maintenance Due  Topic Date Due  . COLONOSCOPY  12/13/2003  . ZOSTAVAX  12/12/2013  . OPHTHALMOLOGY EXAM  08/23/2014  . MAMMOGRAM  09/27/2014  . LIPID PANEL  12/04/2014    _________________________________________________  HPI: Morgan Wilkerson is a 60 y.o. female here for acute on chronic low back pain. Pt has a PMH outlined below.  Please see problem-based charting assessment and plan for further status of patient's chronic medical problems addressed at today's visit.  PMH: Past Medical History  Diagnosis Date  . Essential hypertension   . Toe fracture 2001/2002    Left pinky toe  . Gastritis     Chronic, per biopsy  . Depression   . Seasonal allergic rhinitis     Spring time  . Gastroesophageal reflux disease   . Anxiety     Panic disorder  . Anemia     Anemia of chronic disease  . Fibromyalgia   . Chronic pain syndrome   . Osteopenia     DEXA 05/06/2008: L spine T -1.0, Left femur T -0.9, Right femur T -1.1  . Inappropriate sinus tachycardia ([place])   . Refusal of blood transfusions as patient is Jehovah's Witness   . Type II diabetes mellitus ([place]) 6/09  . Migraine     "q 2-3 months" (10/15/2013)  . Osteoarthritis of both knees     Tricompartmental, worse in lateral compartment  . Osteoarthritis of both shoulders     Mild  . Chronic back pain     Medications: Current Outpatient Prescriptions on File Prior to Visit  Medication Sig Dispense Refill  . ACCU-CHEK FASTCLIX LANCETS MISC 1 each by Does not apply route daily. Check blood sugar once per day. 102 each 12  . baclofen (LIORESAL) 10 MG tablet TAKE ONE TABLET BY MOUTH TWICE DAILY 60 tablet 2  . Blood Glucose Monitoring Suppl (ACCU-CHEK NANO SMARTVIEW) W/DEVICE KIT 1 kit by Does not apply route daily.  Check blood sugar once a day. 1 kit 0  . diphenhydrAMINE (BENADRYL) 25 mg capsule Take 1 capsule (25 mg total) by mouth every 12 (twelve) hours as needed for itching or allergies. 14 capsule 0  . gabapentin (NEURONTIN) 100 MG capsule Take 1 capsule (100 mg total) by mouth 3 (three) times daily as needed. 90 capsule 6  . glucose blood (ACCU-CHEK SMARTVIEW) test strip Check blood sugar once a day. 50 each 12  . guaiFENesin (MUCINEX) 600 MG 12 hr tablet Take 600 mg by mouth 2 (two) times daily as needed for cough or to loosen phlegm.    Marland Kitchen HYDROcodone-acetaminophen (NORCO) 10-325 MG tablet Take 1 tablet by mouth every 12 (twelve) hours as needed. For pain 60 tablet 0  . ketotifen (ZADITOR) 0.025 % ophthalmic solution Place 1 drop into both eyes as needed (dry eyes).    . metoprolol tartrate (LOPRESSOR) 50 MG tablet Take 1 tablet (50 mg total) by mouth 2 (two) times daily. 60 tablet 3  . venlafaxine (EFFEXOR) 75 MG tablet 3 tablets every morning and 2 tablets at bedtime 450 tablet 6  . venlafaxine (EFFEXOR) 75 MG tablet TAKE THREE TABLETS BY MOUTH IN THE MORNING AND THEN TAKE TWO AT BEDTIME DAILY 450 tablet 0   No current facility-administered  medications on file prior to visit.    Allergies: Allergies  Allergen Reactions  . Ibuprofen Itching    Rash  . Methocarbamol     Jitteriness and Panic  . Propoxyphene N-Acetaminophen Itching    Rash  . Citric Acid Itching and Rash  . Oxycodone-Acetaminophen Rash    FH: Family History  Problem Relation Age of Onset  . Hypertension Mother   . Cerebral aneurysm Maternal Aunt   . Early death Father 58    Raod accident  . Healthy Sister   . Healthy Brother   . Healthy Daughter   . Cerebral aneurysm Maternal Grandfather   . Cerebral aneurysm Maternal Aunt   . Healthy Sister   . Healthy Sister   . Healthy Sister   . Healthy Sister   . Healthy Brother   . Healthy Brother   . Healthy Grandchild     SH: Social History   Social History  .  Marital Status: Married    Spouse Name: N/A  . Number of Children: N/A  . Years of Education: 14   Occupational History  .     Social History Main Topics  . Smoking status: Never Smoker   . Smokeless tobacco: Never Used  . Alcohol Use: No     Comment: Rarely.   . Drug Use: No  . Sexual Activity: Not Currently   Other Topics Concern  . None   Social History Narrative   Patients phone number is [phone]    Review of Systems: Constitutional: Negative for fever, chills.  Eyes: Negative for blurred vision.  Respiratory: Negative for cough and shortness of breath.  Cardiovascular: Negative for chest pain.  Gastrointestinal: Negative for nausea, vomiting. MSK: +low back pain.  Neurological: Negative for dizziness, weakness, paresthesias.    Objective:   Vital Signs: Filed Vitals:   10/02/15 1430 10/02/15 1517  BP: 159/77 130/80  Pulse: 91 90  Temp: 98.4 F (36.9 C)   TempSrc: Oral   Height: 5' 3.5" (1.613 m)   Weight: 187 lb 4.8 oz (84.959 kg)   SpO2: 100%      BP Readings from Last 3 Encounters:  10/02/15 130/80  09/16/15 168/105  09/01/15 164/90    Physical Exam: Constitutional: Vital signs reviewed.  Patient appears somewhat in distress but cooperative with exam.  Head: Normocephalic and atraumatic. Eyes: EOMI, conjunctivae nl, no scleral icterus.  Cardiovascular: RRR, no MRG. Pulmonary/Chest: Normal effort, CTAB, no wheezes, rales, or rhonchi. Abdominal: Soft. NT/ND +BS. Musculoskeletal: Lumbar right paraspinal muscle tenderness to palpation.  LE reflexes and sensation intact.   Neurological: A&O x3, cranial nerves II-XII are grossly intact, moving all extremities. Extremities: No LE edema. Skin: Warm, dry and intact. No rash.   Assessment & Plan:   Assessment and plan was discussed and formulated with my attending.

## 2015-10-03 LAB — VITAMIN D 25 HYDROXY (VIT D DEFICIENCY, FRACTURES): VIT D 25 HYDROXY: 15.3 ng/mL — AB (ref 30.0–100.0)

## 2015-10-06 NOTE — Progress Notes (Signed)
Internal Medicine Clinic Attending  Case discussed with Dr. Gill at the time of the visit.  We reviewed the resident's history and exam and pertinent patient test results.  I agree with the assessment, diagnosis, and plan of care documented in the resident's note.  

## 2015-10-07 ENCOUNTER — Telehealth: Payer: Self-pay | Admitting: Licensed Clinical Social Worker

## 2015-10-07 ENCOUNTER — Other Ambulatory Visit: Payer: Self-pay | Admitting: Internal Medicine

## 2015-10-07 MED ORDER — VITAMIN D (ERGOCALCIFEROL) 1.25 MG (50000 UNIT) PO CAPS: 50000.0000 [IU] | ORAL_CAPSULE | ORAL | Status: AC

## 2015-10-07 NOTE — Telephone Encounter (Signed)
[name] was referred to CSW as pt voiced need for transportation assistance to/from PT appointments.  CSW placed call to [name], pt's voicemail is not set up at this time, unable to leave message.  Pt is showing has active with [facility], CSW will attempt to confirm if pt is active with [facility], if so request assistance with [place].

## 2015-10-09 NOTE — Telephone Encounter (Signed)
Confirmed with [facility].  Pt is not active at this time.  CSW placed called to pt.  CSW left message requesting return call. CSW provided contact hours and phone number.

## 2015-10-13 NOTE — Telephone Encounter (Signed)
Unable to reach patient by telephone.  CSW will sign off.  Letter mailed.

## 2015-10-19 ENCOUNTER — Encounter (HOSPITAL_COMMUNITY): Payer: Self-pay | Admitting: *Deleted

## 2015-10-19 DIAGNOSIS — R51 Headache: Secondary | ICD-10-CM | POA: Diagnosis not present

## 2015-10-19 DIAGNOSIS — I10 Essential (primary) hypertension: Secondary | ICD-10-CM | POA: Diagnosis not present

## 2015-10-19 DIAGNOSIS — E1165 Type 2 diabetes mellitus with hyperglycemia: Secondary | ICD-10-CM | POA: Diagnosis present

## 2015-10-19 DIAGNOSIS — R410 Disorientation, unspecified: Secondary | ICD-10-CM | POA: Diagnosis not present

## 2015-10-19 DIAGNOSIS — F22 Delusional disorders: Secondary | ICD-10-CM | POA: Diagnosis not present

## 2015-10-19 DIAGNOSIS — R4182 Altered mental status, unspecified: Secondary | ICD-10-CM | POA: Diagnosis not present

## 2015-10-19 DIAGNOSIS — Z79899 Other long term (current) drug therapy: Secondary | ICD-10-CM | POA: Diagnosis not present

## 2015-10-19 LAB — RAPID URINE DRUG SCREEN, HOSP PERFORMED
AMPHETAMINES: NOT DETECTED
BENZODIAZEPINES: NOT DETECTED
Barbiturates: NOT DETECTED
COCAINE: NOT DETECTED
OPIATES: POSITIVE — AB
TETRAHYDROCANNABINOL: NOT DETECTED

## 2015-10-19 LAB — COMPREHENSIVE METABOLIC PANEL
ALT: 11 U/L — AB (ref 14–54)
AST: 18 U/L (ref 15–41)
Albumin: 3.8 g/dL (ref 3.5–5.0)
Alkaline Phosphatase: 58 U/L (ref 38–126)
Anion gap: 7 (ref 5–15)
BUN: 6 mg/dL (ref 6–20)
CHLORIDE: 103 mmol/L (ref 101–111)
CO2: 26 mmol/L (ref 22–32)
CREATININE: 0.63 mg/dL (ref 0.44–1.00)
Calcium: 9.5 mg/dL (ref 8.9–10.3)
GFR calc non Af Amer: >60 mL/min (ref >60)
Glucose, Bld: 121 mg/dL — ABNORMAL HIGH (ref 65–99)
Potassium: 4.1 mmol/L (ref 3.5–5.1)
SODIUM: 136 mmol/L (ref 135–145)
Total Bilirubin: 0.3 mg/dL (ref 0.3–1.2)
Total Protein: 7.5 g/dL (ref 6.5–8.1)

## 2015-10-19 LAB — CBC
HCT: 33.9 % — ABNORMAL LOW (ref 36.0–46.0)
HEMOGLOBIN: 10.7 g/dL — AB (ref 12.0–15.0)
MCH: 28.1 pg (ref 26.0–34.0)
MCHC: 31.6 g/dL (ref 30.0–36.0)
MCV: 89 fL (ref 78.0–100.0)
Platelets: 236 10*3/uL (ref 150–400)
RBC: 3.81 MIL/uL — AB (ref 3.87–5.11)
RDW: 14 % (ref 11.5–15.5)
WBC: 9.5 10*3/uL (ref 4.0–10.5)

## 2015-10-19 LAB — SALICYLATE LEVEL

## 2015-10-19 LAB — CBG MONITORING, ED
GLUCOSE-CAPILLARY: 123 mg/dL — AB (ref 65–99)
Glucose-Capillary: 109 mg/dL — ABNORMAL HIGH (ref 65–99)

## 2015-10-19 LAB — ETHANOL: Alcohol, Ethyl (B): <5 mg/dL (ref <5)

## 2015-10-19 LAB — ACETAMINOPHEN LEVEL

## 2015-10-19 NOTE — ED Notes (Signed)
Pt with hx of anxiety states she travelled and left her meds in [place] in Fri.  However, for one MONTH, pt has been having hallucinations, thinking that there are a group of people in the yard, people walking through the house or that her husband wants to hurt her. Husband states hallucinations are getting worse. She locks herself in th room.  Denies HI or SI.  Also states she feels her blood sugar is running high.

## 2015-10-19 NOTE — ED Notes (Signed)
Pt CBG, 123. Nurse was notified. 

## 2015-10-20 ENCOUNTER — Encounter: Payer: Medicare Other | Attending: Registered Nurse | Admitting: Registered Nurse

## 2015-10-20 ENCOUNTER — Emergency Department (HOSPITAL_COMMUNITY)
Admission: EM | Admit: 2015-10-20 | Discharge: 2015-10-20 | Disposition: A | Discharge status: Other Acute Inpatient Hospital | Payer: Medicare Other | Attending: Emergency Medicine | Admitting: Emergency Medicine

## 2015-10-20 ENCOUNTER — Emergency Department (HOSPITAL_COMMUNITY): Payer: Medicare Other

## 2015-10-20 DIAGNOSIS — M545 Low back pain: Secondary | ICD-10-CM | POA: Insufficient documentation

## 2015-10-20 DIAGNOSIS — M25552 Pain in left hip: Secondary | ICD-10-CM | POA: Insufficient documentation

## 2015-10-20 DIAGNOSIS — G8929 Other chronic pain: Secondary | ICD-10-CM | POA: Insufficient documentation

## 2015-10-20 DIAGNOSIS — R410 Disorientation, unspecified: Secondary | ICD-10-CM | POA: Diagnosis not present

## 2015-10-20 DIAGNOSIS — M542 Cervicalgia: Secondary | ICD-10-CM | POA: Insufficient documentation

## 2015-10-20 DIAGNOSIS — F29 Unspecified psychosis not due to a substance or known physiological condition: Secondary | ICD-10-CM

## 2015-10-20 DIAGNOSIS — R4182 Altered mental status, unspecified: Secondary | ICD-10-CM | POA: Diagnosis not present

## 2015-10-20 DIAGNOSIS — M79604 Pain in right leg: Secondary | ICD-10-CM | POA: Insufficient documentation

## 2015-10-20 DIAGNOSIS — Z760 Encounter for issue of repeat prescription: Secondary | ICD-10-CM | POA: Insufficient documentation

## 2015-10-20 DIAGNOSIS — M79605 Pain in left leg: Secondary | ICD-10-CM | POA: Insufficient documentation

## 2015-10-20 DIAGNOSIS — M25551 Pain in right hip: Secondary | ICD-10-CM | POA: Insufficient documentation

## 2015-10-20 LAB — CBG MONITORING, ED
GLUCOSE-CAPILLARY: 112 mg/dL — AB (ref 65–99)
GLUCOSE-CAPILLARY: 103 mg/dL — AB (ref 65–99)
Glucose-Capillary: 154 mg/dL — ABNORMAL HIGH (ref 65–99)

## 2015-10-20 LAB — URINALYSIS, ROUTINE W REFLEX MICROSCOPIC
BILIRUBIN URINE: NEGATIVE
Glucose, UA: NEGATIVE mg/dL
Hgb urine dipstick: NEGATIVE
KETONES UR: NEGATIVE mg/dL
NITRITE: NEGATIVE
PROTEIN: NEGATIVE mg/dL
Specific Gravity, Urine: 1.009 (ref 1.005–1.030)
pH: 6 (ref 5.0–8.0)

## 2015-10-20 LAB — URINE MICROSCOPIC-ADD ON: RBC / HPF: NONE SEEN RBC/hpf (ref 0–5)

## 2015-10-20 MED ORDER — VENLAFAXINE HCL 75 MG PO TABS
150.0000 mg | ORAL_TABLET | Freq: Every evening | ORAL | Status: DC
  Filled 2015-10-20: qty 2

## 2015-10-20 MED ORDER — METOPROLOL TARTRATE 25 MG PO TABS
50.0000 mg | ORAL_TABLET | Freq: Two times a day (BID) | ORAL | Status: DC
  Administered 2015-10-20: 50 mg via ORAL
  Filled 2015-10-20: qty 2

## 2015-10-20 MED ORDER — HYDROCODONE-ACETAMINOPHEN 10-325 MG PO TABS: 1.0000 | ORAL_TABLET | Freq: Two times a day (BID) | ORAL | Status: DC | PRN

## 2015-10-20 MED ORDER — BACLOFEN 10 MG PO TABS
10.0000 mg | ORAL_TABLET | Freq: Two times a day (BID) | ORAL | Status: DC
  Administered 2015-10-20: 10 mg via ORAL
  Filled 2015-10-20 (×3): qty 1

## 2015-10-20 MED ORDER — GABAPENTIN 100 MG PO CAPS: 100.0000 mg | ORAL_CAPSULE | Freq: Three times a day (TID) | ORAL | Status: DC | PRN

## 2015-10-20 MED ORDER — INSULIN ASPART 100 UNIT/ML ~~LOC~~ SOLN
0.0000 [IU] | SUBCUTANEOUS | Status: DC
  Administered 2015-10-20: 2 [IU] via SUBCUTANEOUS
  Filled 2015-10-20: qty 1

## 2015-10-20 MED ORDER — VENLAFAXINE HCL 75 MG PO TABS
225.0000 mg | ORAL_TABLET | Freq: Every day | ORAL | Status: DC
  Administered 2015-10-20: 225 mg via ORAL
  Filled 2015-10-20 (×2): qty 3

## 2015-10-20 MED ORDER — OLOPATADINE HCL 0.1 % OP SOLN: 1.0000 [drp] | OPHTHALMIC | Status: DC | PRN

## 2015-10-20 NOTE — ED Notes (Signed)
Roderic Palau, [facility] reviewing chart.

## 2015-10-20 NOTE — Progress Notes (Signed)
Informed Morgan Wilkerson at [facility] that IVC papers are in process for pt.

## 2015-10-20 NOTE — BH Assessment (Signed)
Per Molli Barrows at [facility] the patient has been accepted.  The accepting is Dr Dareen Piano.  The report number is [phone].  The patient is going to [employer] [facility] [employer] [address].  The patient can come at any time.    Writer informed the patient daughter that the patient. Writer informed the RN Jacqlyn Larsen)

## 2015-10-20 NOTE — ED Notes (Signed)
EKG and CXR ordered as per [facility] request d/t geri-psych placement.

## 2015-10-20 NOTE — ED Notes (Addendum)
Spoke w/pt's daughter, Tillie Rung [phone] - who is present. States pt and spouse are hoarders and pt lost her bag that has her meds in it approx 2 months ago so pt has not been taking meds. States pt was hitting wall in her house w/a hammer d/t was experiencing VH that a girl was in between the wall.

## 2015-10-20 NOTE — BH Assessment (Signed)
Morgan Wilkerson requested to speak to the nurse regarding medical questions before the patient can be considered for placement.  Writer gave Morgan Wilkerson the number to the [facility]

## 2015-10-20 NOTE — ED Notes (Signed)
Left message for [facility] notifying of need for transport when IVC papers are served.

## 2015-10-20 NOTE — ED Notes (Signed)
Verified w/Magistrate Marcello Moores IVC papers received. Advised he will sign off on them and call [employer] for them to be served.

## 2015-10-20 NOTE — ED Notes (Signed)
Sgt Paschal called from [place] may be a while before pt is transported to [facility].

## 2015-10-20 NOTE — ED Notes (Signed)
IVC papers served. 

## 2015-10-20 NOTE — ED Notes (Addendum)
Caryl Pina, SW, talking w/pt's daughter. Daughter requesting for pt to be IVC'd d/t concerned pt may attempt to leave and states she is not in a frame of mind to sign herself in to a facility.

## 2015-10-20 NOTE — ED Notes (Signed)
Per Ava, [facility] - accepted to [facility]. Obtain IVC papers.

## 2015-10-20 NOTE — Progress Notes (Signed)
Spoke with pt's daughter Avanell Shackleton [phone].  She states pt has no hx of psychotic symptoms to her knowledge, until she began "hallucinating and having delusions about 2 months ago." States, "she thinks people are living in her walls, her speech doesn't make sense, she is sleeping almost all day, not showering, eating only a little bit." States this progressed for a month, and daughter took pt to see her PCP. States PCP recommended she see psychiatrist, but daughter is unsure if referral or appt was made. States, "She may have cancelled that, she was already so confused by then." States "yesterday she was saying things about being so paranoid, and feeling unsafe in the house like the people in the walls would hurt her, and we thought this is getting unsafe, and brought her to the emergency room."  Daughter states pt was prescribed Effexor by PCP but has never seen a mental health provider for treatment to her knowledge. 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 Pt lives with her spouse, Evolet Varriale [phone], and daughter states she and spouse are primary supports for pt. States pt does not have a healthcare power of attorney.  Daughter and spouse aware inpatient treatment has been recommended for pt, and they are in agreement.   Referred to: [facility]- per Shawna [facility]- per [facility]- per [facility] (no beds today but will review if d/c's occur) [facility]- per [facility] (no beds today, will review for waiting list) [facility]- per [facility] Left voicemail for [facility] Camel, will refer if appropriate.  Sharren Bridge, MSW, LCSW Clinical Social Work, Disposition  10/20/2015 [phone]

## 2015-10-20 NOTE — ED Notes (Signed)
Daughter, Tillie Rung, aware pt accepted to O[facility]. Advised will call to let her know when pt is transported.

## 2015-10-20 NOTE — ED Notes (Signed)
Daughter visiting w/pt - pt and daughter aware of [facility] policies - voiced understanding.

## 2015-10-20 NOTE — ED Notes (Signed)
Pt sitting on bed talking w/sitter.

## 2015-10-20 NOTE — ED Notes (Signed)
Daughter:  Tillie Rung  [phone] Husband:  Suezanne Jacquet  [phone] Husband took all belongings home except for clothing.

## 2015-10-20 NOTE — ED Notes (Signed)
Left message for deputy advising have IVC papers in hand.

## 2015-10-20 NOTE — Progress Notes (Signed)
CSW engaged with Patient's daughter in Consultation Rm A in [facility] ED. CSW discussed the process for obtaining a [place]. CSW explained that the HCPOA process must be initiated by the Patient and Patient must be able to make and communicate health care decisions at the time the form is completed. CSW explained with a HCPOA is, who can be a HCPOA, what decisions the HCPOA can make, when HCPOA is effective, how to revoke HCPOA, and how to complete the [facility] legal documents. CSW provided Patient's daughter with a packet regarding advanced directives. Patient's daughter denies any further questions or concerns.     Emiliano Dyer, LCSW [facility] ED/56M Clinical Social Worker [phone]

## 2015-10-20 NOTE — ED Notes (Signed)
Pt ambulatory to Pod C22 w/EMT. Pt noted to be wearing paper scrubs and scarf on head.

## 2015-10-20 NOTE — BH Assessment (Signed)
Per Dr. Dwyane Dee - patient meets criteria for inpatient hospitalization at a [facility] facility.  CSW will seek placement.

## 2015-10-20 NOTE — BH Assessment (Addendum)
Tele Assessment Note   Patient is a 60 year old [place] American female that has been experiencing visual and auditory hallucinations for the past month.  Patient reports that has been hallucinating more at night when she is trying to sleep.  Patient reports that she is not able to understand what the voices are saying to her.  Patient reports that she has been seeing men in her room that she believes want to hurt her and hearing loud partying downstairs while she is trying to go to sleep.   Patient reports that she has also been locking herself in her room because she is afraid her husband and the hallucinations are "trying to get her".   Collateral information for the patient's daughter reports that she has never been in a psychiatric inpatient hospital.  The patient has never been to counseling or a psychiatrist.  The patient receives depression medication (Effexor) from her PC Dr. Tessa Lerner.   The patient's husband reports that the patient has been having hallucinations, thinking that there are a group of people in the back yard.  The patient thinks that the people are walking through the house.  Her husband reports that the hallucinations are getting worse.   Patient denies SI/HI/Substance Abuse.  Patient denies physical, sexual or emotional abuse.    Diagnosis: Mood Disorder   Past Medical History:  Past Medical History  Diagnosis Date  . Essential hypertension   . Toe fracture 2001/2002    Left pinky toe  . Gastritis     Chronic, per biopsy  . Depression   . Seasonal allergic rhinitis     Spring time  . Gastroesophageal reflux disease   . Anxiety     Panic disorder  . Anemia     Anemia of chronic disease  . Fibromyalgia   . Chronic pain syndrome   . Osteopenia     DEXA 05/06/2008: L spine T -1.0, Left femur T -0.9, Right femur T -1.1  . Inappropriate sinus tachycardia ([place])   . Refusal of blood transfusions as patient is Jehovah's Witness   . Type II diabetes mellitus  ([place]) 6/09  . Migraine     "q 2-3 months" (10/15/2013)  . Osteoarthritis of both knees     Tricompartmental, worse in lateral compartment  . Osteoarthritis of both shoulders     Mild  . Chronic back pain     Past Surgical History  Procedure Laterality Date  . Temporal artery biopsy / ligation Left 6/09    Negative for temperal arteritis  . Cystoscopy      With hydrolic bladder distension  . Vaginal hysterectomy  2003    with oophorectomy, for dysfunctional uterine bleeding  . Cholecystectomy      for chronic acalculus cholecystitis  . Tubal ligation      Family History:  Family History  Problem Relation Age of Onset  . Hypertension Mother   . Cerebral aneurysm Maternal Aunt   . Early death Father 25    Raod accident  . Healthy Sister   . Healthy Brother   . Healthy Daughter   . Cerebral aneurysm Maternal Grandfather   . Cerebral aneurysm Maternal Aunt   . Healthy Sister   . Healthy Sister   . Healthy Sister   . Healthy Sister   . Healthy Brother   . Healthy Brother   . Healthy Grandchild     Social History:  reports that she has never smoked. She has never used smokeless tobacco. She  reports that she does not drink alcohol or use illicit drugs.  Additional Social History:  Alcohol / Drug Use History of alcohol / drug use?: No history of alcohol / drug abuse  CIWA: CIWA-Ar BP: 160/87 mmHg Pulse Rate: 82 COWS:    PATIENT STRENGTHS: (choose at least two) Average or above average intelligence Capable of independent living Communication skills Financial means Physical Health Supportive family/friends  Allergies:  Allergies  Allergen Reactions  . Ibuprofen Itching    Rash  . Methocarbamol     Jitteriness and Panic  . Propoxyphene N-Acetaminophen Itching    Rash  . Citric Acid Itching and Rash  . Oxycodone-Acetaminophen Rash    Home Medications:  (Not in a hospital admission)  OB/GYN Status:  Patient's last menstrual period was  03/08/2002.  General Assessment Data Location of Assessment: [facility] TTS Assessment: In system Is this a Tele or Face-to-Face Assessment?: Tele Assessment Is this an Initial Assessment or a Re-assessment for this encounter?: Initial Assessment Marital status: Married [place] name: Morgan Wilkerson Is patient pregnant?: No Pregnancy Status: No Living Arrangements: Spouse/significant other Can pt return to current living arrangement?: Yes Admission Status: Voluntary Is patient capable of signing voluntary admission?: Yes Referral Source: Self/Family/Friend Insurance type: [facility]     Crisis Care Plan Living Arrangements: Spouse/significant other Legal Guardian:  (NA) Name of Psychiatrist: None Reported Name of Therapist: None Reported  Education Status Is patient currently in school?: No Current Grade: NA Highest grade of school patient has completed: NA Name of school: NA Contact person: NA  Risk to self with the past 6 months Suicidal Ideation: No Has patient been a risk to self within the past 6 months prior to admission? : No Suicidal Intent: No Has patient had any suicidal intent within the past 6 months prior to admission? : No Is patient at risk for suicide?: No Suicidal Plan?: No Has patient had any suicidal plan within the past 6 months prior to admission? : No Access to Means: No What has been your use of drugs/alcohol within the last 12 months?: None Reported Previous Attempts/Gestures: No How many times?: 0 Other Self Harm Risks: None Reported Triggers for Past Attempts: None known Intentional Self Injurious Behavior: None Family Suicide History: No Recent stressful life event(s): Other (Comment) (Responsing to internal stimuli) Persecutory voices/beliefs?: Yes Depression: Yes Depression Symptoms: Despondent, Isolating, Fatigue, Guilt, Loss of interest in usual pleasures, Feeling worthless/self pity Substance abuse history and/or treatment for substance abuse?:  No Suicide prevention information given to non-admitted patients: Not applicable  Risk to Others within the past 6 months Homicidal Ideation: No Does patient have any lifetime risk of violence toward others beyond the six months prior to admission? : No Thoughts of Harm to Others: No Current Homicidal Intent: No Current Homicidal Plan: No Access to Homicidal Means: No Identified Victim: None Reported History of harm to others?: No Assessment of Violence: None Noted Violent Behavior Description: Calm Does patient have access to weapons?: No Criminal Charges Pending?: No Does patient have a court date: No Is patient on probation?: No  Psychosis Hallucinations: Auditory, Visual Delusions: None noted  Mental Status Report Appearance/Hygiene: Disheveled Eye Contact: Fair Motor Activity: Freedom of movement, Restlessness Speech: Soft, Slow Level of Consciousness: Alert, Restless Mood: Anxious, Suspicious Affect: Anxious, Depressed Anxiety Level: Minimal Thought Processes: Flight of Ideas, Thought Blocking Judgement: Impaired Orientation: Person, Place, Time, Situation Obsessive Compulsive Thoughts/Behaviors: None  Cognitive Functioning Concentration: Decreased Memory: Recent Intact, Remote Intact IQ: Average Insight: Poor Impulse Control:  Poor Appetite: Fair Weight Loss: 0 Weight Gain: 0 Sleep: Decreased Total Hours of Sleep: 4 Vegetative Symptoms: None  ADLScreening [facility] Assessment Services) Patient's cognitive ability adequate to safely complete daily activities?: Yes Patient able to express need for assistance with ADLs?: No Independently performs ADLs?: Yes (appropriate for developmental age)  Prior Inpatient Therapy Prior Inpatient Therapy: No Prior Therapy Dates: NA Prior Therapy Facilty/Provider(s): NA Reason for Treatment: NA  Prior Outpatient Therapy Prior Outpatient Therapy: No Prior Therapy Dates: NA Prior Therapy Facilty/Provider(s): NA Reason  for Treatment: NA Does patient have an ACCT team?: No Does patient have Intensive In-House Services?  : No Does patient have [facility] services? : No Does patient have P4CC services?: No  ADL Screening (condition at time of admission) Patient's cognitive ability adequate to safely complete daily activities?: Yes Is the patient deaf or have difficulty hearing?: No Does the patient have difficulty seeing, even when wearing glasses/contacts?: No Does the patient have difficulty concentrating, remembering, or making decisions?: Yes Patient able to express need for assistance with ADLs?: No Does the patient have difficulty dressing or bathing?: No Independently performs ADLs?: Yes (appropriate for developmental age) Does the patient have difficulty walking or climbing stairs?: No Weakness of Legs: None Weakness of Arms/Hands: None  Home Assistive Devices/Equipment Home Assistive Devices/Equipment: None    Abuse/Neglect Assessment (Assessment to be complete while patient is alone) Physical Abuse: Denies Verbal Abuse: Denies Sexual Abuse: Denies Exploitation of patient/patient's resources: Denies Self-Neglect: Denies Values / Beliefs Cultural Requests During Hospitalization: None Spiritual Requests During Hospitalization: None Consults Spiritual Care Consult Needed: No Social Work Consult Needed: No Regulatory affairs officer (For Healthcare) Does patient have an advance directive?: No Would patient like information on creating an advanced directive?: No - patient declined information Copy of advanced directive(s) in chart?: No - copy requested    Additional Information 1:1 In Past 12 Months?: No CIRT Risk: No Elopement Risk: No Does patient have medical clearance?: Yes     Disposition: Per Dr. Dwyane Dee patient meets criteria for inpatient hospitalization at a [facility] facility.  CSW will seek placement.  Disposition Initial Assessment Completed for this Encounter: Yes  Rene Paci 10/20/2015 8:17 AM

## 2015-10-20 NOTE — ED Notes (Signed)
Patient CBG was 103, Nurse was informed.

## 2015-10-20 NOTE — ED Notes (Signed)
Original IVC paperwork completed by EDP placed in folder for Magistrate, copy of full set of IVC papers sent to medical records. All 3 sets on chart for deputy.

## 2015-10-20 NOTE — ED Notes (Signed)
Patient was given a snack and drink, a regular diet ordered for lunch. 

## 2015-10-20 NOTE — ED Provider Notes (Signed)
CSN: [id number]     Arrival date & time 10/19/15  1718 History  By signing my name below, I, Reola Mosher, attest that this documentation has been prepared under the direction and in the presence of Orpah Greek, MD.  Electronically Signed: Reola Mosher, ED Scribe. 10/20/2015. 2:34 AM.   Chief Complaint  Patient presents with  . Hallucinations  . Hyperglycemia   The history is provided by the patient. No language interpreter was used.   HPI Comments: Morgan Wilkerson is a 60 y.o. female with a PMHx of HTN, Depression, Anemia, Fibromyalgia, Osteopenia, chronic back pain, DM2, and Migraines who presents to the Emergency Department complaining of gradually worsening, intermittent, persistent auditory and visual hallucinations onset 1 month ago. She has associated R-sided HA, depression, anxiety, sleep disturbance, and urinary frequency. She also reports that she is having problems controlling her sugar levels, and does not take medications to control her levels. She states that she attempts to control her sugar levels through diet modification and exercise. Pt states that she is not actively hallucinating in the ED currently, but has been experiencing them more often at night when she is trying to sleep. Pt has a hx of sleep problems due to her chronic back pain, but it has been more acutely affected due to her recent episodes of hallucinations. She also reports that she has been falling more frequently. Pt states that she has been seeing men in her room that she believes want to hurt her and hearing loud partying downstairs while she is trying to go to sleep. She has also been locking herself in her room because she is afraid her husband and the hallucinations are "trying to get her". She has been taking unspecified pain medications for her HA with mild relief. She is currently taking Effexor for her hx of depression. She denies SI or HI at this time.  Past Medical History   Diagnosis Date  . Essential hypertension   . Toe fracture 2001/2002    Left pinky toe  . Gastritis     Chronic, per biopsy  . Depression   . Seasonal allergic rhinitis     [place]  . Gastroesophageal reflux disease   . Anxiety     Panic disorder  . Anemia     Anemia of chronic disease  . Fibromyalgia   . Chronic pain syndrome   . Osteopenia     DEXA 05/06/2008: L spine T -1.0, Left femur T -0.9, Right femur T -1.1  . Inappropriate sinus tachycardia ([place])   . Refusal of blood transfusions as patient is Jehovah's Witness   . Type II diabetes mellitus ([place]) 6/09  . Migraine     "q 2-3 months" (10/15/2013)  . Osteoarthritis of both knees     Tricompartmental, worse in lateral compartment  . Osteoarthritis of both shoulders     Mild  . Chronic back pain    Past Surgical History  Procedure Laterality Date  . Temporal artery biopsy / ligation Left 6/09    Negative for temperal arteritis  . Cystoscopy      With hydrolic bladder distension  . Vaginal hysterectomy  2003    with oophorectomy, for dysfunctional uterine bleeding  . Cholecystectomy      for chronic acalculus cholecystitis  . Tubal ligation     Family History  Problem Relation Age of Onset  . Hypertension Mother   . Cerebral aneurysm Maternal Aunt   . Early death Father 26  Raod accident  . Healthy Sister   . Healthy Brother   . Healthy Daughter   . Cerebral aneurysm Maternal Grandfather   . Cerebral aneurysm Maternal Aunt   . Healthy Sister   . Healthy Sister   . Healthy Sister   . Healthy Sister   . Healthy Brother   . Healthy Brother   . Healthy Grandchild    Social History  Substance Use Topics  . Smoking status: Never Smoker   . Smokeless tobacco: Never Used  . Alcohol Use: No     Comment: Rarely.    OB History    No data available     Review of Systems  Constitutional: Negative for fever.  Genitourinary: Positive for frequency.  Neurological: Positive for headaches.   Psychiatric/Behavioral: Positive for hallucinations, sleep disturbance and dysphoric mood. Negative for suicidal ideas. The patient is nervous/anxious.   All other systems reviewed and are negative.  Allergies  Ibuprofen; Methocarbamol; Propoxyphene n-acetaminophen; Citric acid; and Oxycodone-acetaminophen  Home Medications   Prior to Admission medications   Medication Sig Start Date End Date Taking? Authorizing Provider  ACCU-CHEK FASTCLIX LANCETS MISC 1 each by Does not apply route daily. Check blood sugar once per day. 12/03/13   Charlesetta Shanks, MD  baclofen (LIORESAL) 10 MG tablet TAKE ONE TABLET BY MOUTH TWICE DAILY 06/26/15   Bayard Hugger, NP  Blood Glucose Monitoring Suppl (ACCU-CHEK NANO SMARTVIEW) W/DEVICE KIT 1 kit by Does not apply route daily. Check blood sugar once a day. 12/03/13   Charlesetta Shanks, MD  diphenhydrAMINE (BENADRYL) 25 mg capsule Take 1 capsule (25 mg total) by mouth every 12 (twelve) hours as needed for itching or allergies. 12/01/14 12/01/15  Francesca Oman, DO  gabapentin (NEURONTIN) 100 MG capsule Take 1 capsule (100 mg total) by mouth 3 (three) times daily as needed. 07/08/15   Norman Herrlich, MD  glucose blood (ACCU-CHEK SMARTVIEW) test strip Check blood sugar once a day. 12/03/13   Charlesetta Shanks, MD  guaiFENesin (MUCINEX) 600 MG 12 hr tablet Take 600 mg by mouth 2 (two) times daily as needed for cough or to loosen phlegm.    Historical Provider, MD  HYDROcodone-acetaminophen (NORCO) 10-325 MG tablet Take 1 tablet by mouth every 12 (twelve) hours as needed. For pain 09/16/15   Meredith Staggers, MD  ketotifen (ZADITOR) 0.025 % ophthalmic solution Place 1 drop into both eyes as needed (dry eyes).    Historical Provider, MD  metoprolol tartrate (LOPRESSOR) 50 MG tablet Take 1 tablet (50 mg total) by mouth 2 (two) times daily. 09/16/15   Meredith Staggers, MD  venlafaxine [facility]) 75 MG tablet 3 tablets every morning and 2 tablets at bedtime 07/08/15   Norman Herrlich,  MD  venlafaxine [facility]) 75 MG tablet TAKE THREE TABLETS BY MOUTH IN THE MORNING AND THEN TAKE TWO AT BEDTIME DAILY 08/26/15   Norman Herrlich, MD  Vitamin D, Ergocalciferol, (DRISDOL) 50000 units CAPS capsule Take 1 capsule (50,000 Units total) by mouth every 7 (seven) days. 10/07/15   Chohan Bales, MD   BP 161/80 mmHg  Pulse 84  Temp(Src) 98.6 F (37 C) (Oral)  Resp 16  SpO2 100%  LMP 03/08/2002   Physical Exam  Constitutional: She is oriented to person, place, and time. She appears well-developed and well-nourished. No distress.  HENT:  Head: Normocephalic and atraumatic.  Right Ear: Hearing normal.  Left Ear: Hearing normal.  Nose: Nose normal.  Mouth/Throat: Oropharynx is  clear and moist and mucous membranes are normal.  Eyes: Conjunctivae and EOM are normal. Pupils are equal, round, and reactive to light.  Neck: Normal range of motion. Neck supple.  Cardiovascular: Regular rhythm, S1 normal and S2 normal.  Exam reveals no gallop and no friction rub.   No murmur heard. Pulmonary/Chest: Effort normal and breath sounds normal. No respiratory distress. She exhibits no tenderness.  Abdominal: Soft. Normal appearance and bowel sounds are normal. There is no hepatosplenomegaly. There is no tenderness. There is no rebound, no guarding, no tenderness at McBurney's point and negative Murphy's sign. No hernia.  Musculoskeletal: Normal range of motion.  Neurological: She is alert and oriented to person, place, and time. She has normal strength. No cranial nerve deficit or sensory deficit. Coordination normal. GCS eye subscore is 4. GCS verbal subscore is 5. GCS motor subscore is 6.  Extraocular muscle movement: normal No visual field cut Pupils: equal and reactive both direct and consensual response is normal No nystagmus present    Sensory function is intact to light touch, pinprick Proprioception intact  Grip strength 5/5 symmetric in upper extremities No pronator drift finger to  nose bilaterally shaky, but symmetric  Lower extremity strength 5/5 against gravity Normal heel to shin bilaterally  Gait: normal   Skin: Skin is warm, dry and intact. No rash noted. No cyanosis.  Psychiatric: Her speech is normal. Her mood appears anxious. Thought content is paranoid. She exhibits a depressed mood. She expresses no homicidal and no suicidal ideation. She expresses no suicidal plans and no homicidal plans.  Nursing note and vitals reviewed.   ED Course  Procedures (including critical care time)  DIAGNOSTIC STUDIES: Oxygen Saturation is 100% on RA, normal by my interpretation.   COORDINATION OF CARE: 1:53 AM-Discussed next steps with pt including CT Head w/o contrast. Pt verbalized understanding and is agreeable with the plan.   Labs Review Labs Reviewed  COMPREHENSIVE METABOLIC PANEL - Abnormal; Notable for the following:    Glucose, Bld 121 (*)    ALT 11 (*)    All other components within normal limits  ACETAMINOPHEN LEVEL - Abnormal; Notable for the following:    Acetaminophen (Tylenol), Serum <10 (*)    All other components within normal limits  CBC - Abnormal; Notable for the following:    RBC 3.81 (*)    Hemoglobin 10.7 (*)    HCT 33.9 (*)    All other components within normal limits  URINE RAPID DRUG SCREEN, HOSP PERFORMED - Abnormal; Notable for the following:    Opiates POSITIVE (*)    All other components within normal limits  CBG MONITORING, ED - Abnormal; Notable for the following:    Glucose-Capillary 123 (*)    All other components within normal limits  CBG MONITORING, ED - Abnormal; Notable for the following:    Glucose-Capillary 109 (*)    All other components within normal limits  ETHANOL  SALICYLATE LEVEL  URINALYSIS, ROUTINE W REFLEX MICROSCOPIC (NOT AT [facility])    Imaging Review Ct Head Wo Contrast  10/20/2015  CLINICAL DATA:  Mental status changes.  Hallucinations for 1 month. EXAM: CT HEAD WITHOUT CONTRAST TECHNIQUE: Contiguous  axial images were obtained from the base of the skull through the vertex without intravenous contrast. COMPARISON:  05/28/2015 FINDINGS: Ventricles and sulci appear symmetrical. No ventricular dilatation. No mass effect or midline shift. No abnormal extra-axial fluid collections. Gray-white matter junctions are distinct. Basal cisterns are not effaced. No evidence of acute intracranial hemorrhage.  No depressed skull fractures. Visualized paranasal sinuses and mastoid air cells are not opacified. Vascular calcifications. IMPRESSION: No acute intracranial abnormalities. Electronically Signed   By: Lucienne Capers M.D.   On: 10/20/2015 02:58   I have personally reviewed and evaluated these images and lab results as part of my medical decision-making.   EKG Interpretation None      MDM   Final diagnoses:  None  Paranoia Delusions  Presents to the emergency department for evaluation of multiple complaints. Patient reports that she has a history of anxiety. Over the last month she has been experiencing severe episodes of anxiety and has also been experiencing hallucinations. She and her husband report that she has been hearing and seeing people in the house that are not there. She has become very fearful, thinks that these people are after her. She has been locking herself in her room. Husband tells me that she has been thinking that he is trying to hurt her as well. In addition to this, she has become extremely concerned about her blood sugar. She does have a history of diabetes, but currently is diet controlled. Her blood sugars have not been elevated but she is convinced that they are. Patient appears to be somewhat paranoid and delusional. Reviewing her records reveals that she does have a history of depression and is on significant doses of Effexor. Her medical workup has been entirely unremarkable. She will therefore require psychiatric evaluation. Patient is medically clear for psychiatric  evaluation.  I personally performed the services described in this documentation, which was scribed in my presence. The recorded information has been reviewed and is accurate.     Orpah Greek, MD 10/20/15 [phone]

## 2015-10-20 NOTE — ED Notes (Signed)
Advised pt's daughter pt being transported to [facility]

## 2015-10-20 NOTE — ED Notes (Signed)
Sgt Paschal called and advised deputy en route to transport pt.

## 2015-10-20 NOTE — ED Notes (Signed)
Pt back from CT

## 2015-10-26 ENCOUNTER — Encounter: Payer: Self-pay | Admitting: *Deleted

## 2015-10-28 ENCOUNTER — Telehealth: Payer: Self-pay | Admitting: Physical Medicine & Rehabilitation

## 2015-10-28 NOTE — Telephone Encounter (Signed)
Received VM from Morgan Wilkerson at behavioral health. Mrs. Laverdure was discharged yesterday (6-20) after being admitted on 10-20-15. Her daughter took her home yesterday and was attempting to get her into [facility].

## 2015-11-23 ENCOUNTER — Encounter (HOSPITAL_COMMUNITY): Payer: Self-pay

## 2015-11-23 ENCOUNTER — Emergency Department (HOSPITAL_COMMUNITY)
Admission: EM | Admit: 2015-11-23 | Discharge: 2015-11-24 | Payer: Medicare Other | Attending: Emergency Medicine | Admitting: Emergency Medicine

## 2015-11-23 ENCOUNTER — Encounter: Payer: Self-pay | Admitting: *Deleted

## 2015-11-23 ENCOUNTER — Ambulatory Visit: Payer: Self-pay

## 2015-11-23 ENCOUNTER — Emergency Department (HOSPITAL_COMMUNITY): Payer: Medicare Other

## 2015-11-23 DIAGNOSIS — Z79899 Other long term (current) drug therapy: Secondary | ICD-10-CM | POA: Diagnosis not present

## 2015-11-23 DIAGNOSIS — M170 Bilateral primary osteoarthritis of knee: Secondary | ICD-10-CM | POA: Insufficient documentation

## 2015-11-23 DIAGNOSIS — F29 Unspecified psychosis not due to a substance or known physiological condition: Secondary | ICD-10-CM | POA: Diagnosis not present

## 2015-11-23 DIAGNOSIS — M19011 Primary osteoarthritis, right shoulder: Secondary | ICD-10-CM | POA: Insufficient documentation

## 2015-11-23 DIAGNOSIS — F329 Major depressive disorder, single episode, unspecified: Secondary | ICD-10-CM | POA: Insufficient documentation

## 2015-11-23 DIAGNOSIS — M19012 Primary osteoarthritis, left shoulder: Secondary | ICD-10-CM | POA: Insufficient documentation

## 2015-11-23 DIAGNOSIS — R443 Hallucinations, unspecified: Secondary | ICD-10-CM | POA: Diagnosis present

## 2015-11-23 DIAGNOSIS — R4182 Altered mental status, unspecified: Secondary | ICD-10-CM | POA: Diagnosis not present

## 2015-11-23 DIAGNOSIS — I10 Essential (primary) hypertension: Secondary | ICD-10-CM | POA: Insufficient documentation

## 2015-11-23 DIAGNOSIS — E119 Type 2 diabetes mellitus without complications: Secondary | ICD-10-CM | POA: Diagnosis not present

## 2015-11-23 DIAGNOSIS — F333 Major depressive disorder, recurrent, severe with psychotic symptoms: Secondary | ICD-10-CM | POA: Diagnosis present

## 2015-11-23 LAB — URINALYSIS, ROUTINE W REFLEX MICROSCOPIC
BILIRUBIN URINE: NEGATIVE
Glucose, UA: NEGATIVE mg/dL
HGB URINE DIPSTICK: NEGATIVE
Ketones, ur: NEGATIVE mg/dL
NITRITE: NEGATIVE
Protein, ur: NEGATIVE mg/dL
Specific Gravity, Urine: 1.026 (ref 1.005–1.030)
pH: 5.5 (ref 5.0–8.0)

## 2015-11-23 LAB — COMPREHENSIVE METABOLIC PANEL
ALBUMIN: 4.2 g/dL (ref 3.5–5.0)
ALT: 10 U/L — AB (ref 14–54)
AST: 19 U/L (ref 15–41)
Alkaline Phosphatase: 49 U/L (ref 38–126)
Anion gap: 7 (ref 5–15)
BILIRUBIN TOTAL: 0.9 mg/dL (ref 0.3–1.2)
BUN: 11 mg/dL (ref 6–20)
CHLORIDE: 106 mmol/L (ref 101–111)
CO2: 25 mmol/L (ref 22–32)
CREATININE: 0.72 mg/dL (ref 0.44–1.00)
Calcium: 9.2 mg/dL (ref 8.9–10.3)
GFR calc Af Amer: >60 mL/min (ref >60)
GLUCOSE: 100 mg/dL — AB (ref 65–99)
POTASSIUM: 3.6 mmol/L (ref 3.5–5.1)
Sodium: 138 mmol/L (ref 135–145)
TOTAL PROTEIN: 8 g/dL (ref 6.5–8.1)

## 2015-11-23 LAB — RAPID URINE DRUG SCREEN, HOSP PERFORMED
AMPHETAMINES: NOT DETECTED
BARBITURATES: NOT DETECTED
BENZODIAZEPINES: NOT DETECTED
Cocaine: NOT DETECTED
Opiates: POSITIVE — AB
Tetrahydrocannabinol: NOT DETECTED

## 2015-11-23 LAB — CBC WITH DIFFERENTIAL/PLATELET
BASOS ABS: 0 10*3/uL (ref 0.0–0.1)
BASOS PCT: 0 %
EOS ABS: 0.1 10*3/uL (ref 0.0–0.7)
EOS PCT: 1 %
HCT: 31.7 % — ABNORMAL LOW (ref 36.0–46.0)
HEMOGLOBIN: 10.2 g/dL — AB (ref 12.0–15.0)
Lymphocytes Relative: 32 %
Lymphs Abs: 2.3 10*3/uL (ref 0.7–4.0)
MCH: 28.7 pg (ref 26.0–34.0)
MCHC: 32.2 g/dL (ref 30.0–36.0)
MCV: 89 fL (ref 78.0–100.0)
Monocytes Absolute: 0.4 10*3/uL (ref 0.1–1.0)
Monocytes Relative: 6 %
NEUTROS PCT: 61 %
Neutro Abs: 4.4 10*3/uL (ref 1.7–7.7)
PLATELETS: 231 10*3/uL (ref 150–400)
RBC: 3.56 MIL/uL — AB (ref 3.87–5.11)
RDW: 14 % (ref 11.5–15.5)
WBC: 7.2 10*3/uL (ref 4.0–10.5)

## 2015-11-23 LAB — ETHANOL

## 2015-11-23 LAB — URINE MICROSCOPIC-ADD ON

## 2015-11-23 MED ORDER — HYDROCODONE-ACETAMINOPHEN 10-325 MG PO TABS
1.0000 | ORAL_TABLET | Freq: Two times a day (BID) | ORAL | Status: DC | PRN
  Administered 2015-11-23 – 2015-11-24 (×3): 1 via ORAL
  Filled 2015-11-23 (×3): qty 1

## 2015-11-23 MED ORDER — LORAZEPAM 1 MG PO TABS
1.0000 mg | ORAL_TABLET | Freq: Three times a day (TID) | ORAL | Status: DC | PRN
  Administered 2015-11-24: 1 mg via ORAL
  Filled 2015-11-23: qty 1

## 2015-11-23 MED ORDER — GABAPENTIN 100 MG PO CAPS
100.0000 mg | ORAL_CAPSULE | Freq: Three times a day (TID) | ORAL | Status: DC
  Administered 2015-11-23 – 2015-11-24 (×4): 100 mg via ORAL
  Filled 2015-11-23 (×4): qty 1

## 2015-11-23 MED ORDER — METOPROLOL TARTRATE 25 MG PO TABS
50.0000 mg | ORAL_TABLET | Freq: Two times a day (BID) | ORAL | Status: DC
  Administered 2015-11-23 – 2015-11-24 (×3): 50 mg via ORAL
  Filled 2015-11-23 (×3): qty 2

## 2015-11-23 NOTE — ED Notes (Signed)
Pt able to ambulate to restroom with no assist.  Pt does have crutch that she states she uses sometimes for stability only.

## 2015-11-23 NOTE — ED Notes (Signed)
[facility] will not take Pt d/t using a crutch for support.

## 2015-11-23 NOTE — ED Provider Notes (Addendum)
CSN: [id number]     Arrival date & time 11/23/15  1021 History   First MD Initiated Contact with Patient 11/23/15 1049     Chief Complaint  Patient presents with  . Hallucinations   Level V caveat due to psychiatric disorder   The history is provided by the patient.  Patient presents under involuntary commitment. History of psychosis. Patient states that she has been hearing people in her house. States they're usually in the house when her husband is not there. States that they have a pathway through her house through the walls that  take them into her bathroom and around the house. She is on chronic pain medicines. Recent admission at [facility] for the same. States she was feeling better and started to worsen again.  Past Medical History  Diagnosis Date  . Essential hypertension   . Toe fracture 2001/2002    Left pinky toe  . Gastritis     Chronic, per biopsy  . Depression   . Seasonal allergic rhinitis     [place]  . Gastroesophageal reflux disease   . Anxiety     Panic disorder  . Anemia     Anemia of chronic disease  . Fibromyalgia   . Chronic pain syndrome   . Osteopenia     DEXA 05/06/2008: L spine T -1.0, Left femur T -0.9, Right femur T -1.1  . Inappropriate sinus tachycardia ([place])   . Refusal of blood transfusions as patient is Jehovah's Witness   . Type II diabetes mellitus ([place]) 6/09  . Migraine     "q 2-3 months" (10/15/2013)  . Osteoarthritis of both knees     Tricompartmental, worse in lateral compartment  . Osteoarthritis of both shoulders     Mild  . Chronic back pain    Past Surgical History  Procedure Laterality Date  . Temporal artery biopsy / ligation Left 6/09    Negative for temperal arteritis  . Cystoscopy      With hydrolic bladder distension  . Vaginal hysterectomy  2003    with oophorectomy, for dysfunctional uterine bleeding  . Cholecystectomy      for chronic acalculus cholecystitis  . Tubal ligation     Family History  Problem  Relation Age of Onset  . Hypertension Mother   . Cerebral aneurysm Maternal Aunt   . Early death Father 23    Raod accident  . Healthy Sister   . Healthy Brother   . Healthy Daughter   . Cerebral aneurysm Maternal Grandfather   . Cerebral aneurysm Maternal Aunt   . Healthy Sister   . Healthy Sister   . Healthy Sister   . Healthy Sister   . Healthy Brother   . Healthy Brother   . Healthy Grandchild    Social History  Substance Use Topics  . Smoking status: Never Smoker   . Smokeless tobacco: Never Used  . Alcohol Use: No     Comment: Rarely.    OB History    No data available     Review of Systems  Unable to perform ROS: Psychiatric disorder  Constitutional: Negative for activity change and appetite change.  HENT: Negative for congestion.   Eyes: Negative for pain.  Respiratory: Negative for chest tightness and shortness of breath.   Cardiovascular: Negative for chest pain and leg swelling.  Gastrointestinal: Negative for nausea, vomiting, abdominal pain and diarrhea.  Genitourinary: Negative for flank pain.  Musculoskeletal: Negative for back pain and neck stiffness.  Skin: Negative for rash.  Neurological: Negative for weakness, numbness and headaches.  Psychiatric/Behavioral: Positive for hallucinations. Negative for suicidal ideas and behavioral problems.      Allergies  Ibuprofen; Methocarbamol; Citric acid; Oxycodone-acetaminophen; and Propoxyphene n-acetaminophen  Home Medications   Prior to Admission medications   Medication Sig Start Date End Date Taking? Authorizing Provider  ACCU-CHEK FASTCLIX LANCETS MISC 1 each by Does not apply route daily. Check blood sugar once per day. 12/03/13  Yes Langley Gauss Moding, MD  baclofen (LIORESAL) 10 MG tablet TAKE ONE TABLET BY MOUTH TWICE DAILY 06/26/15  Yes Bayard Hugger, NP  Blood Glucose Monitoring Suppl (ACCU-CHEK NANO SMARTVIEW) W/DEVICE KIT 1 kit by Does not apply route daily. Check blood sugar once a day.  12/03/13  Yes Langley Gauss Moding, MD  diphenhydrAMINE (BENADRYL) 25 mg capsule Take 1 capsule (25 mg total) by mouth every 12 (twelve) hours as needed for itching or allergies. 12/01/14 12/01/15 Yes Alex Ronnie Derby, DO  gabapentin (NEURONTIN) 100 MG capsule Take 1 capsule (100 mg total) by mouth 3 (three) times daily as needed. Patient taking differently: Take 100 mg by mouth 2 (two) times daily.  07/08/15  Yes Norman Herrlich, MD  glucose blood (ACCU-CHEK SMARTVIEW) test strip Check blood sugar once a day. 12/03/13  Yes Langley Gauss Moding, MD  guaiFENesin (MUCINEX) 600 MG 12 hr tablet Take 600 mg by mouth 2 (two) times daily as needed for cough or to loosen phlegm.   Yes Historical Provider, MD  HYDROcodone-acetaminophen (NORCO) 10-325 MG tablet Take 1 tablet by mouth every 12 (twelve) hours as needed. For pain 09/16/15  Yes Meredith Staggers, MD  ketotifen (ZADITOR) 0.025 % ophthalmic solution Place 1 drop into both eyes as needed (dry eyes).   Yes Historical Provider, MD  metoprolol tartrate (LOPRESSOR) 50 MG tablet Take 1 tablet (50 mg total) by mouth 2 (two) times daily. 09/16/15  Yes Meredith Staggers, MD  venlafaxine [facility]) 75 MG tablet 3 tablets every morning and 2 tablets at bedtime Patient taking differently: Take 150-225 mg by mouth 2 (two) times daily with a meal. 3 tablets every morning and 2 tablets at bedtime 07/08/15  Yes Norman Herrlich, MD  Vitamin D, Ergocalciferol, (DRISDOL) 50000 units CAPS capsule Take 1 capsule (50,000 Units total) by mouth every 7 (seven) days. Patient taking differently: Take 50,000 Units by mouth every Wednesday.  10/07/15  Yes Brunton Bales, MD   BP 134/73 mmHg  Pulse 68  Temp(Src) 98.6 F (37 C) (Oral)  Resp 14  SpO2 98%  LMP 03/08/2002 Physical Exam  Constitutional: She appears well-developed.  HENT:  Head: Normocephalic.  Neck: Neck supple.  Cardiovascular: Normal rate.   Pulmonary/Chest: Effort normal.  Abdominal: Soft.  Musculoskeletal: She exhibits no  edema.  Neurological: She is alert.  Skin: Skin is warm.  Psychiatric:  Patient is alert and oriented, however she does mention the invisible people going through her house.    ED Course  Procedures (including critical care time) Labs Review Labs Reviewed  COMPREHENSIVE METABOLIC PANEL - Abnormal; Notable for the following:    Glucose, Bld 100 (*)    ALT 10 (*)    All other components within normal limits  URINE RAPID DRUG SCREEN, HOSP PERFORMED - Abnormal; Notable for the following:    Opiates POSITIVE (*)    All other components within normal limits  URINALYSIS, ROUTINE W REFLEX MICROSCOPIC (NOT AT [facility]) - Abnormal; Notable for the following:  APPearance CLOUDY (*)    Leukocytes, UA TRACE (*)    All other components within normal limits  CBC WITH DIFFERENTIAL/PLATELET - Abnormal; Notable for the following:    RBC 3.56 (*)    Hemoglobin 10.2 (*)    HCT 31.7 (*)    All other components within normal limits  URINE MICROSCOPIC-ADD ON - Abnormal; Notable for the following:    Squamous Epithelial / LPF 0-5 (*)    Bacteria, UA MANY (*)    All other components within normal limits  ETHANOL    Imaging Review Dg Chest 2 View  11/23/2015  CLINICAL DATA:  Altered mental status. EXAM: CHEST  2 VIEW COMPARISON:  10/20/2015 FINDINGS: Lung volumes are low. The heart size and mediastinal contours are within normal limits. Minimal atherosclerosis of the aortic arch. Both lungs are clear. The visualized skeletal structures are unremarkable. IMPRESSION: 1. No active cardiopulmonary disease. 2. Minimal thoracic aortic atherosclerosis. Electronically Signed   By: Jeb Levering M.D.   On: 11/23/2015 16:27   I have personally reviewed and evaluated these images and lab results as part of my medical decision-making.   EKG Interpretation   Date/Time:  Monday November 23 2015 15:53:59 EDT Ventricular Rate:  68 PR Interval:    QRS Duration: 87 QT Interval:  468 QTC Calculation: 498 R Axis:    21 Text Interpretation:  Sinus rhythm Prolonged PR interval Abnormal R-wave  progression, early transition Borderline prolonged QT interval No acute  changes Confirmed by Alvino Chapel  MD, Ovid Curd [phone]) on 11/23/2015 4:54:52 PM      MDM   Final diagnoses:  Psychosis, unspecified psychosis type    Patient presents with hallucinations. History of psychosis. May have been off her medicines. He appears medically cleared at this time. To be seen by TTS. Patient did have methadone with her and had a prescription for this from 6 months ago.    Davonna Belling, MD 11/23/15 1155  TTS will be looking for [facility] psych placement. EKG and x-ray added and reassuring.  Davonna Belling, MD 11/23/15 [place], MD 12/19/15 [phone]

## 2015-11-23 NOTE — ED Notes (Signed)
Pt transported to DG.  

## 2015-11-23 NOTE — BH Assessment (Signed)
[facility] Assessment Progress Note  Per Corena Pilgrim, MD, this pt requires psychiatric hospitalization at this time.  The following facilities have been contacted to seek placement for this pt, with results as noted:  Beds available, information sent, decision pending:  [facility]   At capacity:  [facility] [place], [place] Triage Specialist [phone]

## 2015-11-23 NOTE — BH Assessment (Addendum)
Assessment Note  Morgan Wilkerson is an 60 y.o. female presenting to [facility] with [employer] and IVC'd. Patient was IVC'd by her daughter. Patient lives with spouse but spends a lot of time at daughters home. Sts that she went to daughters home to spend time with her grandson "[name]".  Patient started hearing voices in her daughters home today. Called daughter today stating she was locking herself in house. Patient's daughter contacted [employer]. Today patient stating that it was 3-4 men in her daughters home. She tried to get away from the men by locking herself in the room and tying the door knobb with string. She also believes that someone is after her and spouse for money. Patient believes that people are getting in her daughters home. She knocked a hole in the wall to find the people. Patient hearing female voices that are "Mean and Demonic".  Sts that she often hears 3 different voices that are stating, "We want to get in the room" and "Let us in". Sts that she plans to get an alarm system to hopefully keep these random people out of her home. She has called [employer] multiple times to come and search her home but states they never find anything. Patient also having visual hallucinations of shadows. She describes it as people sitting around and starring at her. She denies SI, HI, and AVH's. No alcohol and drug use reported.   Diagnosis: Psychotic Disorder NOS    Past Medical History:  Past Medical History  Diagnosis Date  . Essential hypertension   . Toe fracture 2001/2002    Left pinky toe  . Gastritis     Chronic, per biopsy  . Depression   . Seasonal allergic rhinitis     [place]  . Gastroesophageal reflux disease   . Anxiety     Panic disorder  . Anemia     Anemia of chronic disease  . Fibromyalgia   . Chronic pain syndrome   . Osteopenia     DEXA 05/06/2008: L spine T -1.0, Left femur T -0.9, Right femur T -1.1  . Inappropriate sinus tachycardia ([place])   . Refusal of blood transfusions as patient  is Jehovah's Witness   . Type II diabetes mellitus ([place]) 6/09  . Migraine     "q 2-3 months" (10/15/2013)  . Osteoarthritis of both knees     Tricompartmental, worse in lateral compartment  . Osteoarthritis of both shoulders     Mild  . Chronic back pain     Past Surgical History  Procedure Laterality Date  . Temporal artery biopsy / ligation Left 6/09    Negative for temperal arteritis  . Cystoscopy      With hydrolic bladder distension  . Vaginal hysterectomy  2003    with oophorectomy, for dysfunctional uterine bleeding  . Cholecystectomy      for chronic acalculus cholecystitis  . Tubal ligation      Family History:  Family History  Problem Relation Age of Onset  . Hypertension Mother   . Cerebral aneurysm Maternal Aunt   . Early death Father 33    Raod accident  . Healthy Sister   . Healthy Brother   . Healthy Daughter   . Cerebral aneurysm Maternal Grandfather   . Cerebral aneurysm Maternal Aunt   . Healthy Sister   . Healthy Sister   . Healthy Sister   . Healthy Sister   . Healthy Brother   . Healthy Brother   . Healthy Grandchild  Social History:  reports that she has never smoked. She has never used smokeless tobacco. She reports that she does not drink alcohol or use illicit drugs.  Additional Social History:  Alcohol / Drug Use Pain Medications: SEE MAR Prescriptions: SEE MAR Over the Counter: SEE MAR History of alcohol / drug use?: No history of alcohol / drug abuse  CIWA: CIWA-Ar BP: 150/93 mmHg Pulse Rate: 97 COWS:    Allergies:  Allergies  Allergen Reactions  . Ibuprofen Itching    Rash  . Methocarbamol     Jitteriness and Panic  . Citric Acid Itching and Rash  . Oxycodone-Acetaminophen Rash  . Propoxyphene N-Acetaminophen Itching and Rash    Home Medications:  (Not in a hospital admission)  OB/GYN Status:  Patient's last menstrual period was 03/08/2002.  General Assessment Data [facility] of Assessment: [facility] TTS Assessment:  In system Is this a Tele or Face-to-Face Assessment?: Face-to-Face Is this an Initial Assessment or a Re-assessment for this encounter?: Initial Assessment Marital status: Married [place] name:  (MCNeil) Is patient pregnant?: No Pregnancy Status: No Living Arrangements: Spouse/significant other Can pt return to current living arrangement?: Yes Admission Status: Involuntary Is patient capable of signing voluntary admission?: Yes Referral Source: Self/Family/Friend Insurance type:  [facility])     Crisis Care Plan Living Arrangements: Spouse/significant other Legal Guardian: Other: (n/a) Name of Psychiatrist: None Reported Name of Therapist: None Reported  Education Status Is patient currently in school?: No Current Grade:  (n/a) Highest grade of school patient has completed: NA Name of school: NA Contact person: NA  Risk to self with the past 6 months Suicidal Ideation: No Has patient been a risk to self within the past 6 months prior to admission? : No Suicidal Intent: No Has patient had any suicidal intent within the past 6 months prior to admission? : No Is patient at risk for suicide?: No Has patient had any suicidal plan within the past 6 months prior to admission? : No Access to Means: No What has been your use of drugs/alcohol within the last 12 months?:  (none reported) Previous Attempts/Gestures: No How many times?:  (0) Other Self Harm Risks:  (None Reported) Triggers for Past Attempts: None known Intentional Self Injurious Behavior: None Family Suicide History: No Recent stressful life event(s): Other (Comment) (responding to internal stimulu) Persecutory voices/beliefs?: Yes Depression: Yes Depression Symptoms: Feeling angry/irritable, Feeling worthless/self pity, Loss of interest in usual pleasures, Guilt, Fatigue, Isolating, Tearfulness, Insomnia, Despondent Substance abuse history and/or treatment for substance abuse?: No Suicide prevention information given to  non-admitted patients: Not applicable  Risk to Others within the past 6 months Homicidal Ideation: No Does patient have any lifetime risk of violence toward others beyond the six months prior to admission? : No Thoughts of Harm to Others: No Current Homicidal Intent: No Current Homicidal Plan: No Access to Homicidal Means: No Identified Victim:  (none reported) History of harm to others?: No Assessment of Violence: None Noted Violent Behavior Description:  (calm and cooperative ) Does patient have access to weapons?: No Criminal Charges Pending?: No Does patient have a court date: No Is patient on probation?: No  Psychosis Hallucinations: Auditory, Visual Delusions: None noted  Mental Status Report Appearance/Hygiene: Disheveled Eye Contact: Fair Motor Activity: Freedom of movement Speech: Soft, Slow Level of Consciousness: Alert, Restless Mood: Anxious, Depressed Affect: Anxious, Depressed Anxiety Level: Minimal Thought Processes: Flight of Ideas Judgement: Impaired Orientation: Person, Place, Time, Situation Obsessive Compulsive Thoughts/Behaviors: None  Cognitive Functioning Concentration: Decreased Memory:  Remote Intact, Recent Intact IQ: Average Insight: Poor Impulse Control: Poor Appetite: Fair Weight Loss:  (0) Weight Gain:  (0) Sleep: Decreased Total Hours of Sleep:  (4 hrs of sleep per night) Vegetative Symptoms: None  ADLScreening [facility] Assessment Services) Patient's cognitive ability adequate to safely complete daily activities?: Yes Patient able to express need for assistance with ADLs?: No Independently performs ADLs?: Yes (appropriate for developmental age)  Prior Inpatient Therapy Prior Inpatient Therapy: No Prior Therapy Dates: NA Prior Therapy Facilty/Provider(s): NA Reason for Treatment: NA  Prior Outpatient Therapy Prior Outpatient Therapy: No Prior Therapy Dates: NA Prior Therapy Facilty/Provider(s): NA Reason for Treatment: NA Does  patient have an ACCT team?: No Does patient have Intensive In-House Services?  : No Does patient have [facility] services? : No Does patient have P4CC services?: No  ADL Screening (condition at time of admission) Patient's cognitive ability adequate to safely complete daily activities?: Yes Is the patient deaf or have difficulty hearing?: No Does the patient have difficulty seeing, even when wearing glasses/contacts?: No Does the patient have difficulty concentrating, remembering, or making decisions?: No Patient able to express need for assistance with ADLs?: No Does the patient have difficulty dressing or bathing?: No Independently performs ADLs?: Yes (appropriate for developmental age) Does the patient have difficulty walking or climbing stairs?: Yes Weakness of Legs: None Weakness of Arms/Hands: None  Home Assistive Devices/Equipment Home Assistive Devices/Equipment: None    Abuse/Neglect Assessment (Assessment to be complete while patient is alone) Physical Abuse: Denies Verbal Abuse: Denies Sexual Abuse: Denies Exploitation of patient/patient's resources: Denies Self-Neglect: Denies Values / Beliefs Cultural Requests During Hospitalization: None Spiritual Requests During Hospitalization: None   Advance Directives (For Healthcare) Does patient have an advance directive?: No Would patient like information on creating an advanced directive?: No - patient declined information Type of Advance Directive: Out of facility DNR (pink MOST or yellow form) Nutrition Screen- [facility] Adult/[facility] Patient's home diet: Regular  Additional Information 1:1 In Past 12 Months?: No CIRT Risk: No Elopement Risk: No Does patient have medical clearance?: Yes     Disposition:  Disposition Initial Assessment Completed for this Encounter: Yes  On Site Evaluation by:   Reviewed with Physician:    Evangeline Gula 11/23/2015 1:35 PM

## 2015-11-23 NOTE — Progress Notes (Signed)
CSW spoke with [facility]/Admissions who states that the patient is under review.  Also, [facility] reached out to CSW and state that they have medical questions and that they would like to speak with nurse.  CSW provided nurse with number for [facility] [phone]  Willette Brace [id number] ED CSW 7/17/20175:00 PM

## 2015-11-23 NOTE — Progress Notes (Addendum)
CSW received call from [facility] Admissions/[place] who states that pt has been accepted and is welcomed to come to the facility before 11:00pm  Accepting NP: Allean Found  Nurse Report Number: [phone]  CSW made NP aware. CSW made nurse aware.   Willette Brace [id number] ED CSW 7/17/20176:14 PM

## 2015-11-23 NOTE — ED Notes (Signed)
Pt is IVC. Pt here with [employer].  Pt hearing voices in home.  Pt recent patient at [facility].  Paranoid someone in home living in wall.  Knocked hole in wall to find person.  Believes husband has girlfriend in wall.  Called dtr today stating she was locking herself in house.  Recalls most events and believes voices are real.

## 2015-11-24 ENCOUNTER — Encounter: Payer: Self-pay | Admitting: *Deleted

## 2015-11-24 NOTE — ED Notes (Addendum)
Pt was brought back from the main ER. Pt stated, "I came here to get some new pain medicine. When I run out of vicodin I hear voices." pt stated she used to work for the [employer] and now has back and abd pain for a long time. Pt ambulates with a crutch.Phoned [facility] to give report. (8:05am )Per Raquel Sarna , the writer must phone back and give report once the sherriff arrives. Phoned sheriff for transport. (8:10am )Per sherriff transport will take place at some point this am. Pt states she has bene taking effexor times 6 years. She states she ran out 2 weeks ago. NP made aware. (9:10am )Sherriff arrived to transport the pt. Pts belongings went with her. (9:20am )9:25am -Phoned [facility] to give report on the pt. Report to [place], Therapist, sports.

## 2015-11-24 NOTE — ED Notes (Signed)
Bed: WA31 Expected date:  Expected time:  Means of arrival:  Comments: 

## 2015-12-16 NOTE — Telephone Encounter (Signed)
error 

## 2015-12-22 ENCOUNTER — Ambulatory Visit (INDEPENDENT_AMBULATORY_CARE_PROVIDER_SITE_OTHER): Payer: Medicare Other | Admitting: Internal Medicine

## 2015-12-22 VITALS — BP 148/90 | HR 125 | Temp 98.2°F | Ht 63.5 in | Wt 173.3 lb

## 2015-12-22 DIAGNOSIS — G894 Chronic pain syndrome: Secondary | ICD-10-CM | POA: Diagnosis not present

## 2015-12-22 MED ORDER — HYDROCODONE-ACETAMINOPHEN 10-325 MG PO TABS: 1.0000 | ORAL_TABLET | Freq: Two times a day (BID) | ORAL | 0 refills | Status: DC | PRN

## 2015-12-22 NOTE — Assessment & Plan Note (Addendum)
Assessment:  Chronic pain syndrome Morgan Wilkerson follows with Dr. Tessa Lerner at the pain clinic.  She has had trouble contacting the clinic this month with 4-5 attempts.  Today we tried calling the clinic as well and the call rang for a couple of minutes then stopped, with no voicemail option.  Plan - Refilled 1 month prescription of what she is currently taking.  Hydrocodone- Acetaminophen 10-325 Q12 PRN #60 - Told to physically go by the pain clinic to make an appointment to continue care for pain management

## 2015-12-22 NOTE — Patient Instructions (Signed)
Morgan Wilkerson  Please go by your pain clinic to continue care for your pain management.  Thank you

## 2015-12-22 NOTE — Progress Notes (Signed)
   CC: Chronic pain syndrome  HPI:  MorganMorgan Wilkerson is a 60 y.o.female with a diagnosis of chronic pain syndrome that presents to [facility] because she needs a refill of her pain medication.  She sees Dr. Tessa Lerner in the pain clinic for her chronic pain management.  Early this month she tried contacting the clinic to make an appointment but did not get  her usual voicemail option.  She stated she has tried calling 4-5 times with no success.  She has not attempted to go by the pain clinic in person.  Per narcotic database she has consistently received her Hydrocodone- Acetaminophen 10-325 from Dr. Tessa Lerner and has been slowly weaned.  There is one prescription not filled by Dr. Tessa Lerner and it was for 15 pills of Hydrocodone-Acetaminophen on 6/20.  Morgan Wilkerson was discharged from [facility] on 6/20 for psychosis event.  Today she states she has been out of her pain medication for 3 weeks and she has pain throughout her body, specifically her legs.       Past Medical History:  Diagnosis Date  . Anemia    Anemia of chronic disease  . Anxiety    Panic disorder  . Chronic back pain   . Chronic pain syndrome   . Depression   . Essential hypertension   . Fibromyalgia   . Gastritis    Chronic, per biopsy  . Gastroesophageal reflux disease   . Inappropriate sinus tachycardia ([place])   . Migraine    "q 2-3 months" (10/15/2013)  . Osteoarthritis of both knees    Tricompartmental, worse in lateral compartment  . Osteoarthritis of both shoulders    Mild  . Osteopenia    DEXA 05/06/2008: L spine T -1.0, Left femur T -0.9, Right femur T -1.1  . Refusal of blood transfusions as patient is Jehovah's Witness   . Seasonal allergic rhinitis    Spring time  . Toe fracture 2001/2002   Left pinky toe  . Type II diabetes mellitus ([place]) 6/09    Review of Systems:  Review of Systems  Constitutional: Negative for chills and fever.  Respiratory: Negative for shortness of breath.   Cardiovascular: Negative  for chest pain.  Gastrointestinal: Negative for nausea and vomiting.  Musculoskeletal: Positive for back pain and neck pain.     Physical Exam:  Vitals:   12/22/15 1537  BP: (!) 148/90  Pulse: (!) 125  Temp: 98.2 F (36.8 C)  TempSrc: Oral  SpO2: 100%  Weight: 173 lb 4.8 oz (78.6 kg)  Height: 5' 3.5" (1.613 m)   Physical Exam  HENT:  Head: Normocephalic and atraumatic.  Cardiovascular: Normal rate and regular rhythm.   Pulmonary/Chest: Effort normal and breath sounds normal.  Abdominal: Soft. She exhibits no distension.  Musculoskeletal: She exhibits no edema.  5/5 motor strength in lower extremities bilaterally  Skin: Skin is warm and dry.    Assessment & Plan:   See encounters tab for problem based medical decision making.   Patient seen with Dr. Eppie Gibson

## 2015-12-23 NOTE — Progress Notes (Signed)
Patient ID: Morgan Wilkerson, female   DOB: 06/18/1955, 60 y.o.   MRN: [id number]  I saw and evaluated the patient.  I personally confirmed the key portions of Dr. Rosalyn Charters history and exam and reviewed pertinent patient test results.  The assessment, diagnosis, and plan were formulated together and I agree with the documentation in the resident's note.

## 2015-12-23 NOTE — Addendum Note (Signed)
Addended by: Hulan Fray on: 12/23/2015 06:02 PM   Modules accepted: Orders

## 2016-01-06 ENCOUNTER — Other Ambulatory Visit: Payer: Self-pay | Admitting: Registered Nurse

## 2016-01-13 ENCOUNTER — Encounter: Payer: Self-pay | Admitting: Internal Medicine

## 2016-01-15 ENCOUNTER — Telehealth: Payer: Self-pay | Admitting: Internal Medicine

## 2016-01-15 NOTE — Telephone Encounter (Signed)
APT. REMINDER CALL, NO ANSWER, NO VOICEMAIL °

## 2016-01-18 ENCOUNTER — Other Ambulatory Visit: Payer: Self-pay | Admitting: *Deleted

## 2016-01-18 ENCOUNTER — Encounter: Payer: Self-pay | Admitting: Internal Medicine

## 2016-01-18 ENCOUNTER — Telehealth: Payer: Self-pay | Admitting: Internal Medicine

## 2016-01-18 NOTE — Telephone Encounter (Signed)
Pt had called for Baclofen refill - called [facility], pt has refills from rx written 8/31. Called pt back; the pharmacy will get it ready for her.

## 2016-01-18 NOTE — Telephone Encounter (Signed)
baclofen (LIORESAL) 10 MG tablet  [facility]

## 2016-01-19 ENCOUNTER — Encounter: Payer: Self-pay | Admitting: Physical Medicine & Rehabilitation

## 2016-01-19 ENCOUNTER — Encounter: Payer: Medicare Other | Attending: Registered Nurse | Admitting: Physical Medicine & Rehabilitation

## 2016-01-19 VITALS — BP 149/94 | HR 108 | Resp 14

## 2016-01-19 DIAGNOSIS — F333 Major depressive disorder, recurrent, severe with psychotic symptoms: Secondary | ICD-10-CM

## 2016-01-19 DIAGNOSIS — M545 Low back pain: Secondary | ICD-10-CM | POA: Diagnosis not present

## 2016-01-19 DIAGNOSIS — M25551 Pain in right hip: Secondary | ICD-10-CM | POA: Insufficient documentation

## 2016-01-19 DIAGNOSIS — M79604 Pain in right leg: Secondary | ICD-10-CM | POA: Diagnosis not present

## 2016-01-19 DIAGNOSIS — G894 Chronic pain syndrome: Secondary | ICD-10-CM | POA: Diagnosis not present

## 2016-01-19 DIAGNOSIS — M797 Fibromyalgia: Secondary | ICD-10-CM | POA: Diagnosis not present

## 2016-01-19 DIAGNOSIS — M542 Cervicalgia: Secondary | ICD-10-CM | POA: Insufficient documentation

## 2016-01-19 DIAGNOSIS — M79605 Pain in left leg: Secondary | ICD-10-CM | POA: Diagnosis not present

## 2016-01-19 DIAGNOSIS — G8929 Other chronic pain: Secondary | ICD-10-CM | POA: Diagnosis not present

## 2016-01-19 DIAGNOSIS — M25552 Pain in left hip: Secondary | ICD-10-CM | POA: Insufficient documentation

## 2016-01-19 DIAGNOSIS — Z760 Encounter for issue of repeat prescription: Secondary | ICD-10-CM | POA: Insufficient documentation

## 2016-01-19 DIAGNOSIS — M12571 Traumatic arthropathy, right ankle and foot: Secondary | ICD-10-CM

## 2016-01-19 DIAGNOSIS — M19171 Post-traumatic osteoarthritis, right ankle and foot: Secondary | ICD-10-CM

## 2016-01-19 MED ORDER — AMITRIPTYLINE HCL 25 MG PO TABS: 25.0000 mg | ORAL_TABLET | Freq: Every day | ORAL | 2 refills | Status: AC

## 2016-01-19 MED ORDER — HYDROCODONE-ACETAMINOPHEN 10-325 MG PO TABS: 1.0000 | ORAL_TABLET | Freq: Two times a day (BID) | ORAL | 0 refills | Status: DC | PRN

## 2016-01-19 MED ORDER — VENLAFAXINE HCL 75 MG PO TABS
ORAL_TABLET | ORAL | 6 refills | Status: AC
Start: 2016-01-19 — End: ?

## 2016-01-19 NOTE — Progress Notes (Signed)
Subjective:    Patient ID: Morgan Wilkerson, female    DOB: 10-Jan-1956, 60 y.o.   MRN: [id number]  HPI   Sanisha is here in follow up of her chronic pain. She was hospitalized for psychosis with visual and auditory hallucinations over the summer. She states that these have been better over the last several weeks from a standpoint of the psychosis. She does however spend much of the day in her bed. I asked her what a typical day was like for her, and she could not provide any consistent information, typically referring back to her various pain sources  She didn't know where our new office was apparently and went to [facility] for pain medications where hydrocodone 10/325 was reflled last month. She was given #60 and has 6.5 tabs left today. Typically she takes a full tab in the morning, a half tab at noon, half at dinner and half at bedtime. This amount of medication "is not working" for her and she feels that she's in pain all the time. Her sleep is poor yet she is taking 2 effexor tabs at night (on 3 tabs in AM). Her account and history is very vague and inconsistent today.    Pain Inventory Average Pain 10 Pain Right Now 9 My pain is constant, sharp, stabbing and aching  In the last 24 hours, has pain interfered with the following? General activity 10 Relation with others 10 Enjoyment of life 10 What TIME of day is your pain at its worst? all Sleep (in general) Poor  Pain is worse with: walking, bending, sitting, inactivity, standing and some activites Pain improves with: medication Relief from Meds: 4  Mobility walk with assistance ability to climb steps?  no do you drive?  no Do you have any goals in this area?  no  Function disabled: date disabled . I need assistance with the following:  meal prep, household duties and shopping Do you have any goals in this area?  yes  Neuro/Psych numbness tingling trouble walking spasms dizziness confusion depression anxiety loss  of taste or smell  Prior Studies Any changes since last visit?  no  Physicians involved in your care Any changes since last visit?  no   Family History  Problem Relation Age of Onset  . Hypertension Mother   . Cerebral aneurysm Maternal Aunt   . Early death Father 4    Raod accident  . Healthy Sister   . Healthy Brother   . Healthy Daughter   . Cerebral aneurysm Maternal Grandfather   . Cerebral aneurysm Maternal Aunt   . Healthy Sister   . Healthy Sister   . Healthy Sister   . Healthy Sister   . Healthy Brother   . Healthy Brother   . Healthy Grandchild    Social History   Social History  . Marital status: Married    Spouse name: N/A  . Number of children: N/A  . Years of education: 55   Occupational History  .  Unemployed   Social History Main Topics  . Smoking status: Never Smoker  . Smokeless tobacco: Never Used  . Alcohol use No     Comment: Rarely.   . Drug use: No  . Sexual activity: Not Currently   Other Topics Concern  . None   Social History Narrative   Patients phone number is [phone]   Past Surgical History:  Procedure Laterality Date  . CHOLECYSTECTOMY     for chronic acalculus cholecystitis  .  CYSTOSCOPY     With hydrolic bladder distension  . TEMPORAL ARTERY BIOPSY / LIGATION Left 6/09   Negative for temperal arteritis  . TUBAL LIGATION    . VAGINAL HYSTERECTOMY  2003   with oophorectomy, for dysfunctional uterine bleeding   Past Medical History:  Diagnosis Date  . Anemia    Anemia of chronic disease  . Anxiety    Panic disorder  . Chronic back pain   . Chronic pain syndrome   . Depression   . Essential hypertension   . Fibromyalgia   . Gastritis    Chronic, per biopsy  . Gastroesophageal reflux disease   . Inappropriate sinus tachycardia ([place])   . Migraine    "q 2-3 months" (10/15/2013)  . Osteoarthritis of both knees    Tricompartmental, worse in lateral compartment  . Osteoarthritis of both shoulders    Mild    . Osteopenia    DEXA 05/06/2008: L spine T -1.0, Left femur T -0.9, Right femur T -1.1  . Refusal of blood transfusions as patient is Jehovah's Witness   . Seasonal allergic rhinitis    Spring time  . Toe fracture 2001/2002   Left pinky toe  . Type II diabetes mellitus ([place]) 6/09   BP (!) 149/94 (BP Location: Right Arm, Patient Position: Sitting, Cuff Size: Large)   Pulse (!) 108   Resp 14   LMP 03/08/2002   SpO2 95%   Opioid Risk Score:   Fall Risk Score:  `1  Depression screen PHQ 2/9  Depression screen [facility] 2/9 12/22/2015 10/02/2015 07/08/2015 07/01/2015 06/04/2015 01/14/2015 12/01/2014  Decreased Interest 0 0 0 3 3 3  0  Down, Depressed, Hopeless 2 0 0 3 3 3 3   PHQ - 2 Score 2 0 0 6 6 6 3   Altered sleeping 1 - - - - - 3  Tired, decreased energy 3 - - - - - 3  Change in appetite 0 - - - - - 3  Feeling bad or failure about yourself  1 - - - - - 3  Trouble concentrating 3 - - - - - 3  Moving slowly or fidgety/restless 3 - - - - - 3  Suicidal thoughts 0 - - - - - 0  PHQ-9 Score 13 - - - - - 21  Difficult doing work/chores Very difficult - - - - - -  Some recent data might be hidden    Review of Systems  Constitutional: Positive for appetite change.  Respiratory: Positive for apnea and shortness of breath.   Gastrointestinal: Positive for nausea.  Endocrine:       Diabetic  Musculoskeletal: Positive for arthralgias, back pain, gait problem, myalgias and neck pain.  Skin: Positive for rash.  Neurological: Positive for numbness.       Tingling  Hematological: Bruises/bleeds easily.  Psychiatric/Behavioral: Positive for confusion and dysphoric mood. The patient is nervous/anxious.   All other systems reviewed and are negative.      Objective:   Physical Exam  GENERAL: The patient is generally fatigued appearing. EXTREMITIES: Gait is wide-based and she uses the crutch to unload the  ankles. She is diffusely tender with palpation from the neck,  shoulders, elbows, hands,  trunk, knees and all way down to the feet. Shoulders are sensitive to touch and basic AROM/PROM Sensory exam is grossly intact. Reflexes are  1+. She has lost weight. Knees are notable for valgus deformities. She has lateral joint line pain bilaterall and crepitus with AROM and  PROM . She is antalgic on the right ankle. She uses her crutch to help offload the right leg. She has limited ROM in the lumbar spine  PSYCH: mood is flat and disconnected. insight and awareness are poor.  SKIN: in tact   ASSESSMENT:  1. Fibromyalgia syndrome.  2. 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  3. Myofascial shoulder pain.  4. Temporal arteritis.  5. Depression. psychotic behavior and inpatient psych admissions over the summer 6. History of insomnia.  7. Osteoarthritis of the knees, chronic right ankle pain.  8. Hypertension   PLAN:  1. Will resume low dose elavil at bedtime 25mg  for pain/sleep.  2. Refilled hydrocodone 10/325 1 q 12 prn #60.   3. Discussed the use of the effexor. Recommended taking the PM dose closer to dinner so as not to affects sleep. Also want to make a referral to Psychology to help address her ongoing depression and psychotic behavior. Her depression remains her biggest problem.  4. Continue crutch or cane to help offload right leg prn.    5. 30 minutes of face to face patient care time were spent during this visit. All questions were encouraged and answered. Follow up with me or my NP next month.

## 2016-01-19 NOTE — Patient Instructions (Signed)
PLEASE CALL ME WITH ANY PROBLEMS OR QUESTIONS ([phone])  

## 2016-02-17 ENCOUNTER — Encounter: Payer: Medicare Other | Attending: Registered Nurse | Admitting: Registered Nurse

## 2016-02-17 ENCOUNTER — Encounter: Payer: Self-pay | Admitting: Registered Nurse

## 2016-02-17 VITALS — BP 136/91 | HR 108

## 2016-02-17 DIAGNOSIS — Z760 Encounter for issue of repeat prescription: Secondary | ICD-10-CM | POA: Insufficient documentation

## 2016-02-17 DIAGNOSIS — M542 Cervicalgia: Secondary | ICD-10-CM | POA: Insufficient documentation

## 2016-02-17 DIAGNOSIS — M79604 Pain in right leg: Secondary | ICD-10-CM | POA: Insufficient documentation

## 2016-02-17 DIAGNOSIS — M79605 Pain in left leg: Secondary | ICD-10-CM | POA: Insufficient documentation

## 2016-02-17 DIAGNOSIS — M62830 Muscle spasm of back: Secondary | ICD-10-CM

## 2016-02-17 DIAGNOSIS — M5416 Radiculopathy, lumbar region: Secondary | ICD-10-CM

## 2016-02-17 DIAGNOSIS — G8929 Other chronic pain: Secondary | ICD-10-CM | POA: Diagnosis not present

## 2016-02-17 DIAGNOSIS — Z5181 Encounter for therapeutic drug level monitoring: Secondary | ICD-10-CM

## 2016-02-17 DIAGNOSIS — M545 Low back pain: Secondary | ICD-10-CM | POA: Insufficient documentation

## 2016-02-17 DIAGNOSIS — G894 Chronic pain syndrome: Secondary | ICD-10-CM | POA: Diagnosis not present

## 2016-02-17 DIAGNOSIS — M797 Fibromyalgia: Secondary | ICD-10-CM

## 2016-02-17 DIAGNOSIS — F333 Major depressive disorder, recurrent, severe with psychotic symptoms: Secondary | ICD-10-CM | POA: Diagnosis not present

## 2016-02-17 DIAGNOSIS — M25552 Pain in left hip: Secondary | ICD-10-CM | POA: Insufficient documentation

## 2016-02-17 DIAGNOSIS — M25551 Pain in right hip: Secondary | ICD-10-CM | POA: Diagnosis not present

## 2016-02-17 DIAGNOSIS — Z79899 Other long term (current) drug therapy: Secondary | ICD-10-CM

## 2016-02-17 MED ORDER — HYDROCODONE-ACETAMINOPHEN 10-325 MG PO TABS: 1.0000 | ORAL_TABLET | Freq: Two times a day (BID) | ORAL | 0 refills | Status: DC | PRN

## 2016-02-17 NOTE — Progress Notes (Signed)
Subjective:    Patient ID: Morgan Wilkerson, female    DOB: [birth year], 60 y.o.   MRN: [id number]  HPI: Morgan Wilkerson is a 60 year old female who returns for follow up for chronic pain and medication refill. She states her pain is located in her bilateral shoulder's and lower back. She rates her pain 8. Her current exercise regime is performing stretching exercises using bands and walking.   Pain Inventory Average Pain 9 Pain Right Now 8 My pain is constant, sharp, stabbing and aching  In the last 24 hours, has pain interfered with the following? General activity 9 Relation with others 10 Enjoyment of life 0 What TIME of day is your pain at its worst? All Sleep (in general) NA  Pain is worse with: walking, bending, sitting, inactivity, standing and some activites Pain improves with: n/a Relief from Meds: little  Mobility walk with assistance how many minutes can you walk? n/a ability to climb steps?  no do you drive?  no Do you have any goals in this area?  no  Function disabled: date disabled 0 I need assistance with the following:  dressing, bathing, toileting, meal prep, household duties and shopping Do you have any goals in this area?  yes  Neuro/Psych bladder control problems weakness numbness tremor tingling trouble walking spasms dizziness confusion depression anxiety  Prior Studies Any changes since last visit?  no  Physicians involved in your care Any changes since last visit?  no   Family History  Problem Relation Age of Onset  . Hypertension Mother   . Cerebral aneurysm Maternal Aunt   . Early death Father 35    Raod accident  . Healthy Sister   . Healthy Brother   . Healthy Daughter   . Cerebral aneurysm Maternal Grandfather   . Cerebral aneurysm Maternal Aunt   . Healthy Sister   . Healthy Sister   . Healthy Sister   . Healthy Sister   . Healthy Brother   . Healthy Brother   . Healthy Grandchild    Social History   Social  History  . Marital status: Married    Spouse name: N/A  . Number of children: N/A  . Years of education: 39   Occupational History  .  Unemployed   Social History Main Topics  . Smoking status: Never Smoker  . Smokeless tobacco: Never Used  . Alcohol use No     Comment: Rarely.   . Drug use: No  . Sexual activity: Not Currently   Other Topics Concern  . Not on file   Social History Narrative   Patients phone number is [phone]   Past Surgical History:  Procedure Laterality Date  . CHOLECYSTECTOMY     for chronic acalculus cholecystitis  . CYSTOSCOPY     With hydrolic bladder distension  . TEMPORAL ARTERY BIOPSY / LIGATION Left 6/09   Negative for temperal arteritis  . TUBAL LIGATION    . VAGINAL HYSTERECTOMY  2003   with oophorectomy, for dysfunctional uterine bleeding   Past Medical History:  Diagnosis Date  . Anemia    Anemia of chronic disease  . Anxiety    Panic disorder  . Chronic back pain   . Chronic pain syndrome   . Depression   . Essential hypertension   . Fibromyalgia   . Gastritis    Chronic, per biopsy  . Gastroesophageal reflux disease   . Inappropriate sinus tachycardia   . Migraine    "  q 2-3 months" (10/15/2013)  . Osteoarthritis of both knees    Tricompartmental, worse in lateral compartment  . Osteoarthritis of both shoulders    Mild  . Osteopenia    DEXA 05/06/2008: L spine T -1.0, Left femur T -0.9, Right femur T -1.1  . Refusal of blood transfusions as patient is Jehovah's Witness   . Seasonal allergic rhinitis    Spring time  . Toe fracture 2001/2002   Left pinky toe  . Type II diabetes mellitus ([place]) 6/09   LMP 03/08/2002   Opioid Risk Score:   Fall Risk Score:  `1  Depression screen PHQ 2/9  Depression screen [facility] 2/9 12/22/2015 10/02/2015 07/08/2015 07/01/2015 06/04/2015 01/14/2015 12/01/2014  Decreased Interest 0 0 0 3 3 3  0  Down, Depressed, Hopeless 2 0 0 3 3 3 3   PHQ - 2 Score 2 0 0 6 6 6 3   Altered sleeping 1 - - - - -  3  Tired, decreased energy 3 - - - - - 3  Change in appetite 0 - - - - - 3  Feeling bad or failure about yourself  1 - - - - - 3  Trouble concentrating 3 - - - - - 3  Moving slowly or fidgety/restless 3 - - - - - 3  Suicidal thoughts 0 - - - - - 0  PHQ-9 Score 13 - - - - - 21  Difficult doing work/chores Very difficult - - - - - -  Some recent data might be hidden      Review of Systems  Constitutional: Positive for appetite change and chills.  Respiratory: Positive for cough.   Endocrine:       Low blood sugar  Musculoskeletal: Positive for gait problem.  Skin: Positive for color change and rash.  Neurological: Positive for dizziness, tremors, weakness and numbness.       Tingling Spasms  Psychiatric/Behavioral: Positive for confusion and dysphoric mood. The patient is nervous/anxious.   All other systems reviewed and are negative.      Objective:   Physical Exam  Constitutional: She is oriented to person, place, and time. She appears well-developed and well-nourished.  HENT:  Head: Normocephalic and atraumatic.  Neck: Normal range of motion. Neck supple.  Cardiovascular: Normal rate and regular rhythm.   Pulmonary/Chest: Effort normal and breath sounds normal.  Musculoskeletal:  Normal Muscle Bulk and Muscle Testing Reveals: Upper Extremities: Full ROM and Muscle Strength 5/5 Thoracic Paraspinal Tenderness: T-11-T-12 Lumbar Paraspinal Tenderness: L-3-L-5 Lower Extremities: Full ROM and Muscle Strength 5/5 Right Lower Extremity Flexion Produces Pain into her Patella Arises from table slowly using single crutch for support Narrow Based Gait  Neurological: She is alert and oriented to person, place, and time.  Skin: Skin is warm and dry.  Psychiatric: She has a normal mood and affect.  Nursing note and vitals reviewed.         Assessment & Plan:  1. Fibromyalgia syndrome: Continue with exercise, heat and ice therapy.  2. Degenerative joint disease of the  bilateral shoulders:  Refilled: Hydrocodone 10/325 mg one tablet every 12 hours as needed # 60. We will continue the opioid monitoring program, this consists of regular clinic visits, examinations, urine drug screen, pill counts as well as use of [place] Controlled Substance Reporting System. 3. Myofascial shoulder pain: Continue Current Medication Regime.Continue HEP 4. Depression: On Effexor. Continue to monitor.  5. History of insomnia. No Complaints Today. Continue to monitor.  6. Osteoarthritis of  the knees: Continue to Monitor 7. Muscle Spasm: Continue Baclofen  20 minutes of face to face patient care time was spent during this visit. All questions were encouraged and answered.   F/U in 8month

## 2016-02-22 ENCOUNTER — Telehealth: Payer: Self-pay | Admitting: Registered Nurse

## 2016-02-22 NOTE — Telephone Encounter (Signed)
Placed a call to Ms. Ronnald Ramp, no answer and voice mail not set up. I placed a call to her daughter Ms. Patra Shortridge, she was given the number for [facility] number. She will assist her mother with making an appointment for psychiatry she states.

## 2016-03-18 ENCOUNTER — Encounter: Payer: Medicare Other | Attending: Registered Nurse | Admitting: Registered Nurse

## 2016-03-18 ENCOUNTER — Encounter: Payer: Self-pay | Admitting: Registered Nurse

## 2016-03-18 VITALS — BP 147/84 | HR 90 | Resp 14

## 2016-03-18 DIAGNOSIS — M79605 Pain in left leg: Secondary | ICD-10-CM | POA: Insufficient documentation

## 2016-03-18 DIAGNOSIS — M797 Fibromyalgia: Secondary | ICD-10-CM | POA: Diagnosis not present

## 2016-03-18 DIAGNOSIS — G894 Chronic pain syndrome: Secondary | ICD-10-CM

## 2016-03-18 DIAGNOSIS — M542 Cervicalgia: Secondary | ICD-10-CM | POA: Diagnosis not present

## 2016-03-18 DIAGNOSIS — M79604 Pain in right leg: Secondary | ICD-10-CM | POA: Insufficient documentation

## 2016-03-18 DIAGNOSIS — M19012 Primary osteoarthritis, left shoulder: Secondary | ICD-10-CM

## 2016-03-18 DIAGNOSIS — M545 Low back pain: Secondary | ICD-10-CM | POA: Diagnosis not present

## 2016-03-18 DIAGNOSIS — G8929 Other chronic pain: Secondary | ICD-10-CM | POA: Diagnosis not present

## 2016-03-18 DIAGNOSIS — Z760 Encounter for issue of repeat prescription: Secondary | ICD-10-CM | POA: Diagnosis not present

## 2016-03-18 DIAGNOSIS — Z79899 Other long term (current) drug therapy: Secondary | ICD-10-CM

## 2016-03-18 DIAGNOSIS — M19011 Primary osteoarthritis, right shoulder: Secondary | ICD-10-CM | POA: Diagnosis not present

## 2016-03-18 DIAGNOSIS — M25552 Pain in left hip: Secondary | ICD-10-CM | POA: Diagnosis not present

## 2016-03-18 DIAGNOSIS — M25551 Pain in right hip: Secondary | ICD-10-CM | POA: Insufficient documentation

## 2016-03-18 DIAGNOSIS — Z5181 Encounter for therapeutic drug level monitoring: Secondary | ICD-10-CM

## 2016-03-18 DIAGNOSIS — M62830 Muscle spasm of back: Secondary | ICD-10-CM | POA: Diagnosis not present

## 2016-03-18 MED ORDER — HYDROCODONE-ACETAMINOPHEN 10-325 MG PO TABS: 1.0000 | ORAL_TABLET | Freq: Two times a day (BID) | ORAL | 0 refills | Status: DC | PRN

## 2016-03-18 NOTE — Progress Notes (Signed)
Subjective:    Patient ID: Morgan Wilkerson, female    DOB: February 03, 1956, 60 y.o.   MRN: [id number]  HPI: Morgan Wilkerson is a 60 year old female who returns for follow up for chronic pain and medication refill. She states her pain is located in her bilateral shoulder's, entire back and bilateral knees. She rates her pain 10. Her current exercise regime is performing sit-ups, stretching exercises using bands and walking.   Pain Inventory Average Pain 8 Pain Right Now 10 My pain is sharp, stabbing, tingling and aching  In the last 24 hours, has pain interfered with the following? General activity 10 Relation with others 2 Enjoyment of life 1 What TIME of day is your pain at its worst? all Sleep (in general) Poor  Pain is worse with: standing and some activites Pain improves with: heat/ice and medication Relief from Meds: 1  Mobility walk with assistance how many minutes can you walk? 20 ability to climb steps?  no do you drive?  no  Function disabled: date disabled . I need assistance with the following:  bathing, meal prep, household duties and shopping  Neuro/Psych bladder control problems bowel control problems weakness numbness tingling trouble walking spasms dizziness depression anxiety  Prior Studies Any changes since last visit?  no  Physicians involved in your care Any changes since last visit?  no   Family History  Problem Relation Age of Onset  . Hypertension Mother   . Cerebral aneurysm Maternal Aunt   . Early death Father 9    Raod accident  . Healthy Sister   . Healthy Brother   . Healthy Daughter   . Cerebral aneurysm Maternal Grandfather   . Cerebral aneurysm Maternal Aunt   . Healthy Sister   . Healthy Sister   . Healthy Sister   . Healthy Sister   . Healthy Brother   . Healthy Brother   . Healthy Grandchild    Social History   Social History  . Marital status: Married    Spouse name: N/A  . Number of children: N/A  . Years  of education: 68   Occupational History  .  Unemployed   Social History Main Topics  . Smoking status: Never Smoker  . Smokeless tobacco: Never Used  . Alcohol use No     Comment: Rarely.   . Drug use: No  . Sexual activity: Not Currently   Other Topics Concern  . None   Social History Narrative   Patients phone number is [phone]   Past Surgical History:  Procedure Laterality Date  . CHOLECYSTECTOMY     for chronic acalculus cholecystitis  . CYSTOSCOPY     With hydrolic bladder distension  . TEMPORAL ARTERY BIOPSY / LIGATION Left 6/09   Negative for temperal arteritis  . TUBAL LIGATION    . VAGINAL HYSTERECTOMY  2003   with oophorectomy, for dysfunctional uterine bleeding   Past Medical History:  Diagnosis Date  . Anemia    Anemia of chronic disease  . Anxiety    Panic disorder  . Chronic back pain   . Chronic pain syndrome   . Depression   . Essential hypertension   . Fibromyalgia   . Gastritis    Chronic, per biopsy  . Gastroesophageal reflux disease   . Inappropriate sinus tachycardia   . Migraine    "q 2-3 months" (10/15/2013)  . Osteoarthritis of both knees    Tricompartmental, worse in lateral compartment  . Osteoarthritis  of both shoulders    Mild  . Osteopenia    DEXA 05/06/2008: L spine T -1.0, Left femur T -0.9, Right femur T -1.1  . Refusal of blood transfusions as patient is Jehovah's Witness   . Seasonal allergic rhinitis    [place]  . Toe fracture 2001/2002   Left pinky toe  . Type II diabetes mellitus ([place]) 6/09   BP (!) 147/84   Pulse 90   Resp 14   LMP 03/08/2002   SpO2 95%   Opioid Risk Score:   Fall Risk Score:  `1  Depression screen PHQ 2/9  Depression screen [facility] 2/9 03/18/2016 12/22/2015 10/02/2015 07/08/2015 07/01/2015 06/04/2015 01/14/2015  Decreased Interest 3 0 0 0 3 3 3   Down, Depressed, Hopeless 3 2 0 0 3 3 3   PHQ - 2 Score 6 2 0 0 6 6 6   Altered sleeping - 1 - - - - -  Tired, decreased energy - 3 - - - - -    Change in appetite - 0 - - - - -  Feeling bad or failure about yourself  - 1 - - - - -  Trouble concentrating - 3 - - - - -  Moving slowly or fidgety/restless - 3 - - - - -  Suicidal thoughts - 0 - - - - -  PHQ-9 Score - 13 - - - - -  Difficult doing work/chores - Very difficult - - - - -  Some recent data might be hidden   Review of Systems  Constitutional: Positive for chills and diaphoresis.  Gastrointestinal: Positive for abdominal pain and constipation.  Endocrine:       High/low blood sugar  All other systems reviewed and are negative.      Objective:   Physical Exam  Constitutional: She is oriented to person, place, and time. She appears well-developed and well-nourished.  HENT:  Head: Normocephalic and atraumatic.  Neck: Normal range of motion. Neck supple.  Cardiovascular: Normal rate and regular rhythm.   Pulmonary/Chest: Effort normal and breath sounds normal.  Musculoskeletal:  Normal Muscle Bulk and Muscle Testing Reveals: Upper Extremities: Full ROM and Muscle Strength 5/5 Right AC Joint Tenderness Thoracic and Lumbar Hypersensitivity Lower Extremities: Full ROM and Muscle Strength 5/5 Bilateral Lower Extremities Flexion Produces Pain into Patella's Arises from Table slowly using single crutch for support Narrow Based Gait   Neurological: She is alert and oriented to person, place, and time.  Skin: Skin is warm and dry.  Psychiatric: She has a normal mood and affect.  Nursing note and vitals reviewed.         Assessment & Plan:  1. Fibromyalgia syndrome: Continue with exercise, heat and ice therapy.  2. Degenerative joint disease of the bilateral shoulders:  Refilled: Hydrocodone 10/325 mg one tablet every 12 hours as needed # 60. We will continue the opioid monitoring program, this consists of regular clinic visits, examinations, urine drug screen, pill counts as well as use of [place] Controlled Substance Reporting System. 3. Myofascial  shoulder pain: Continue Current Medication Regime.Continue HEP 4. Depression: On Effexor. Continue to monitor.  5. History of insomnia. No Complaints Today. Continue to monitor.  6. Osteoarthritis of the knees: Continue to Monitor 7. Muscle Spasm: Continue Baclofen  20 minutes of face to face patient care time was spent during this visit. All questions were encouraged and answered.   F/U in 53month

## 2016-03-26 LAB — TOXASSURE SELECT,+ANTIDEPR,UR

## 2016-04-11 ENCOUNTER — Ambulatory Visit: Payer: Self-pay | Admitting: Registered Nurse

## 2016-04-12 ENCOUNTER — Telehealth: Payer: Self-pay | Admitting: Internal Medicine

## 2016-04-12 NOTE — Telephone Encounter (Signed)
APT. REMINDER CALL, NO ANSWER, NO VOICEMAIL °

## 2016-04-13 ENCOUNTER — Encounter: Payer: Self-pay | Admitting: Internal Medicine

## 2016-04-14 ENCOUNTER — Ambulatory Visit (INDEPENDENT_AMBULATORY_CARE_PROVIDER_SITE_OTHER): Payer: Medicare Other | Admitting: Internal Medicine

## 2016-04-14 ENCOUNTER — Ambulatory Visit (HOSPITAL_COMMUNITY)
Admission: RE | Admit: 2016-04-14 | Discharge: 2016-04-14 | Disposition: A | Discharge status: Home / Self Care | Payer: Medicare Other | Source: Ambulatory Visit | Attending: Student in an Organized Health Care Education/Training Program | Admitting: Student in an Organized Health Care Education/Training Program

## 2016-04-14 VITALS — BP 183/95 | HR 116 | Temp 99.5°F | Ht 63.5 in | Wt 182.9 lb

## 2016-04-14 DIAGNOSIS — Z79899 Other long term (current) drug therapy: Secondary | ICD-10-CM

## 2016-04-14 DIAGNOSIS — R059 Cough, unspecified: Secondary | ICD-10-CM

## 2016-04-14 DIAGNOSIS — Z0000 Encounter for general adult medical examination without abnormal findings: Secondary | ICD-10-CM

## 2016-04-14 DIAGNOSIS — J40 Bronchitis, not specified as acute or chronic: Secondary | ICD-10-CM

## 2016-04-14 DIAGNOSIS — I10 Essential (primary) hypertension: Secondary | ICD-10-CM | POA: Diagnosis not present

## 2016-04-14 DIAGNOSIS — E119 Type 2 diabetes mellitus without complications: Secondary | ICD-10-CM

## 2016-04-14 DIAGNOSIS — R05 Cough: Secondary | ICD-10-CM | POA: Insufficient documentation

## 2016-04-14 DIAGNOSIS — R0602 Shortness of breath: Secondary | ICD-10-CM | POA: Diagnosis not present

## 2016-04-14 LAB — GLUCOSE, CAPILLARY: Glucose-Capillary: 123 mg/dL — ABNORMAL HIGH (ref 65–99)

## 2016-04-14 LAB — POCT GLYCOSYLATED HEMOGLOBIN (HGB A1C): Hemoglobin A1C: 5.8

## 2016-04-14 MED ORDER — BLOOD GLUCOSE MONITOR KIT: PACK | 0 refills | Status: AC

## 2016-04-14 MED ORDER — METOPROLOL TARTRATE 50 MG PO TABS: 50.0000 mg | ORAL_TABLET | Freq: Two times a day (BID) | ORAL | 3 refills | Status: AC

## 2016-04-14 MED ORDER — BENZONATATE 100 MG PO CAPS: 100.0000 mg | ORAL_CAPSULE | Freq: Three times a day (TID) | ORAL | 1 refills | Status: AC | PRN

## 2016-04-14 MED ORDER — BLOOD PRESSURE KIT: 1.0000 | PACK | Freq: Every day | 0 refills | Status: AC

## 2016-04-14 NOTE — Assessment & Plan Note (Signed)
Refused flu shot this visit

## 2016-04-14 NOTE — Progress Notes (Signed)
   CC: diabetes and not feeling well.   HPI:  Morgan Wilkerson is a 60 y.o. with PMHx as outlined below who presents to clinic for diabetes follow up.   Patient states that for about 3 weeks now she's had generalized malaise and feeling like she has the flu. She has all of her muscle aches. She has cough productive of sputum that initially consisted of blood and then changed to a off-white color with streaking of blood. She has had hemoptysis in the past secondary to bronchitis. Her last episode of hemoptysis was 2 days ago. She denies fevers, night sweats, chills. She has a sore throat that has not improved with lozenges.  Please see problem list for further details of patient's chronic medical issues.   Past Medical History:  Diagnosis Date  . Anemia    Anemia of chronic disease  . Anxiety    Panic disorder  . Chronic back pain   . Chronic pain syndrome   . Depression   . Essential hypertension   . Fibromyalgia   . Gastritis    Chronic, per biopsy  . Gastroesophageal reflux disease   . Inappropriate sinus tachycardia   . Migraine    "q 2-3 months" (10/15/2013)  . Osteoarthritis of both knees    Tricompartmental, worse in lateral compartment  . Osteoarthritis of both shoulders    Mild  . Osteopenia    DEXA 05/06/2008: L spine T -1.0, Left femur T -0.9, Right femur T -1.1  . Refusal of blood transfusions as patient is Jehovah's Witness   . Seasonal allergic rhinitis    Spring time  . Toe fracture 2001/2002   Left pinky toe  . Type II diabetes mellitus ([place]) 6/09    Review of Systems:  Pertinent review of systems noted in history of present illness. Review of system otherwise negative.  Physical Exam:  Vitals:   04/14/16 1532  BP: (!) 183/95  Pulse: (!) 116  Temp: 99.5 F (37.5 C)  TempSrc: Oral  SpO2: 99%  Weight: 182 lb 14.4 oz (83 kg)  Height: 5' 3.5" (1.613 m)   Physical Exam  Constitutional: She is oriented to person, place, and time. She appears  well-developed and well-nourished. No distress.  HENT:  Head: Normocephalic and atraumatic. Tender left cervical adenopathy. Negative for sinus tenderness. No tonsillar exudates Nose: Nose normal.  Cardiovascular: Normal rate, regular rhythm and normal heart sounds.  Exam reveals no gallop and no friction rub.   No murmur heard. Pulmonary/Chest: Effort normal and breath sounds normal. No respiratory distress. She has no wheezes. She has no rales.  Abdominal: Soft. Bowel sounds are normal. She exhibits no distension. There is no tenderness. There is no rebound and no guarding.  Neurological: She is alert and oriented to person, place, and time.  Skin: Skin is warm and dry. No rash noted. She is not diaphoretic. No erythema. No pallor.    Assessment & Plan:   See Encounters Tab for problem based charting.  Patient discussed with Dr. Evette Doffing

## 2016-04-14 NOTE — Assessment & Plan Note (Signed)
Assessment: Patient's hemoglobin A1c is 5.8. She is not on any diabetic medications.   plan: continue to follow hemoglobin A1c every 6 months.

## 2016-04-14 NOTE — Patient Instructions (Signed)
You can take tessalon pearls three times a day as needed for your cough and sore throat.   Start taking your metoprolol for your blood pressure as directed.    Chronic Bronchitis Chronic bronchitis is a lasting inflammation of the bronchial tubes, which are the tubes that carry air into your lungs. This is inflammation that occurs:  On most days of the week.  For at least three months at a time.  Over a period of two years in a row. When the bronchial tubes are inflamed, they start to produce mucus. The inflammation and buildup of mucus make it more difficult to breathe. Chronic bronchitis is usually a permanent problem and is one type of chronic obstructive pulmonary disease (COPD). People with chronic bronchitis are at greater risk for getting repeated colds, or respiratory infections. What are the causes? Chronic bronchitis most often occurs in people who have:  Long-standing, severe asthma.  A history of smoking.  Asthma and who also smoke. What are the signs or symptoms? Chronic bronchitis may cause the following:  A cough that brings up mucus (productive cough).  Shortness of breath.  Early morning headache.  Wheezing.  Chest discomfort.  Recurring respiratory infections. How is this diagnosed? Your health care provider may confirm the diagnosis by:  Taking your medical history.  Performing a physical exam.  Taking a chest X-ray.  Performing pulmonary function tests. How is this treated? Treatment involves controlling symptoms with medicines, oxygen therapy, or making lifestyle changes, such as exercising and eating a healthy, well-balanced diet. Medicines could include:  Inhalers to improve air flow in and out of your lungs.  Antibiotics to treat bacterial infections, such as pneumonia, sinus infections, and acute bronchitis. As a preventative measure, your health care provider may recommend routine vaccinations for influenza and pneumonia. This is to  prevent infection and hospitalization since you may be more at risk for these types of infections. Follow these instructions at home:  Take medicines only as directed by your health care provider.  If you smoke cigarettes, chew tobacco, or use electronic cigarettes, quit. If you need help quitting, ask your health care provider.  Avoid pollen, dust, animal dander, molds, smoke, and other things that cause shortness of breath or wheezing attacks.  Talk to your health care provider about possible exercise routines. Regular exercise is very important to help you feel better.  If you are prescribed oxygen use at home follow these guidelines:  Never smoke while using oxygen. Oxygen does not burn or explode, but flammable materials will burn faster in the presence of oxygen.  Keep a Data processing manager close by. Let your fire department know that you have oxygen in your home.  Warn visitors not to smoke near you when you are using oxygen. Put up "no smoking" signs in your home where you most often use the oxygen.  Regularly test your smoke detectors at home to make sure they work. If you receive care in your home from a nurse or other health care provider, he or she may also check to make sure your smoke detectors work.  Ask your health care provider whether you would benefit from a pulmonary rehabilitation program.  Do not wait to get medical care if you have any concerning symptoms. Delays could cause permanent injury and may be life threatening. Contact a health care provider if:  You have increased coughing or shortness of breath or both.  You have muscle aches.  You have chest pain.  Your mucus gets  thicker.  Your mucus changes from clear or white to yellow, green, gray, or bloody. Get help right away if:  Your usual medicines do not stop your wheezing.  You have increased difficulty breathing.  You have any problems with the medicine you are taking, such as a rash, itching,  swelling, or trouble breathing. This information is not intended to replace advice given to you by your health care provider. Make sure you discuss any questions you have with your health care provider. Document Released: 02/10/2006 Document Revised: 09/03/2015 Document Reviewed: 06/03/2013 Elsevier Interactive Patient Education  2017 [employer].

## 2016-04-14 NOTE — Assessment & Plan Note (Signed)
Assessment: Patient's blood pressure today 183/95. She has not been taking her metoprolol for the past month after she ran this medication. She felt that her blood pressure was controlled which is why she did not refill it.  Plan: Rx for blood pressure kit and refill metoprolol. Follow-up in one month. At that time is Her blood pressure is still elevated can start an ACE inhibitor.

## 2016-04-15 ENCOUNTER — Other Ambulatory Visit: Payer: Self-pay | Admitting: *Deleted

## 2016-04-15 ENCOUNTER — Encounter: Payer: Self-pay | Admitting: Registered Nurse

## 2016-04-15 ENCOUNTER — Encounter: Payer: Medicare Other | Attending: Registered Nurse | Admitting: Registered Nurse

## 2016-04-15 VITALS — BP 157/91 | HR 101

## 2016-04-15 DIAGNOSIS — M25552 Pain in left hip: Secondary | ICD-10-CM | POA: Diagnosis not present

## 2016-04-15 DIAGNOSIS — M545 Low back pain: Secondary | ICD-10-CM | POA: Diagnosis not present

## 2016-04-15 DIAGNOSIS — G894 Chronic pain syndrome: Secondary | ICD-10-CM | POA: Diagnosis not present

## 2016-04-15 DIAGNOSIS — Z760 Encounter for issue of repeat prescription: Secondary | ICD-10-CM | POA: Diagnosis not present

## 2016-04-15 DIAGNOSIS — M79605 Pain in left leg: Secondary | ICD-10-CM | POA: Diagnosis not present

## 2016-04-15 DIAGNOSIS — M79604 Pain in right leg: Secondary | ICD-10-CM | POA: Diagnosis not present

## 2016-04-15 DIAGNOSIS — M25551 Pain in right hip: Secondary | ICD-10-CM | POA: Diagnosis not present

## 2016-04-15 DIAGNOSIS — M542 Cervicalgia: Secondary | ICD-10-CM | POA: Insufficient documentation

## 2016-04-15 DIAGNOSIS — Z79899 Other long term (current) drug therapy: Secondary | ICD-10-CM

## 2016-04-15 DIAGNOSIS — M797 Fibromyalgia: Secondary | ICD-10-CM

## 2016-04-15 DIAGNOSIS — M62830 Muscle spasm of back: Secondary | ICD-10-CM | POA: Diagnosis not present

## 2016-04-15 DIAGNOSIS — G8929 Other chronic pain: Secondary | ICD-10-CM

## 2016-04-15 DIAGNOSIS — Z5181 Encounter for therapeutic drug level monitoring: Secondary | ICD-10-CM

## 2016-04-15 MED ORDER — HYDROCODONE-ACETAMINOPHEN 10-325 MG PO TABS: 1.0000 | ORAL_TABLET | Freq: Two times a day (BID) | ORAL | 0 refills | Status: DC | PRN

## 2016-04-15 NOTE — Assessment & Plan Note (Addendum)
Assessment: Patient presenting with 3 weeks of malaise, sore throat, and productive cough. She has tender cervical lymphadenopathy that Has improved over the course of the past 3 weeks. On exam she is afebrile with out any palpable cervical lymph nodes but she is tender to palpation of her left side of her neck. We'll treat for bronchitis.  Plan: rx for tessalon pearls. CXR ordered this visit negative for PNA.

## 2016-04-15 NOTE — Progress Notes (Signed)
Subjective:    Patient ID: Morgan Wilkerson, female    DOB: 04-25-1956, 60 y.o.   MRN: [id number]  HPI:  Morgan Wilkerson is a 60year old female who returns for follow up for chronic pain and medication refill. She states her pain is located in her right shoulder and lower back.She rates her pain 9. Her current exercise regime is performing sit-ups, stretching exercises using bands and walking.   Pain Inventory Average Pain 8 Pain Right Now 9 My pain is constant, sharp, burning, dull, stabbing, tingling and aching  In the last 24 hours, has pain interfered with the following? General activity 8 Relation with others 8 Enjoyment of life 8 What TIME of day is your pain at its worst? daytime, evening Sleep (in general) Fair  Pain is worse with: walking, bending, sitting, standing and some activites Pain improves with: rest and medication Relief from Meds: 6  Mobility walk without assistance ability to climb steps?  no do you drive?  no  Function disabled: date disabled . I need assistance with the following:  meal prep, household duties and shopping  Neuro/Psych No problems in this area  Prior Studies Any changes since last visit?  no  Physicians involved in your care Any changes since last visit?  no   Family History  Problem Relation Age of Onset  . Hypertension Mother   . Cerebral aneurysm Maternal Aunt   . Early death Father 77    Raod accident  . Healthy Sister   . Healthy Brother   . Healthy Daughter   . Cerebral aneurysm Maternal Grandfather   . Cerebral aneurysm Maternal Aunt   . Healthy Sister   . Healthy Sister   . Healthy Sister   . Healthy Sister   . Healthy Brother   . Healthy Brother   . Healthy Grandchild    Social History   Social History  . Marital status: Married    Spouse name: N/A  . Number of children: N/A  . Years of education: 26   Occupational History  .  Unemployed   Social History Main Topics  . Smoking status: Never  Smoker  . Smokeless tobacco: Never Used  . Alcohol use No     Comment: Rarely.   . Drug use: No  . Sexual activity: Not Currently   Other Topics Concern  . Not on file   Social History Narrative   Patients phone number is [phone]   Past Surgical History:  Procedure Laterality Date  . CHOLECYSTECTOMY     for chronic acalculus cholecystitis  . CYSTOSCOPY     With hydrolic bladder distension  . TEMPORAL ARTERY BIOPSY / LIGATION Left 6/09   Negative for temperal arteritis  . TUBAL LIGATION    . VAGINAL HYSTERECTOMY  2003   with oophorectomy, for dysfunctional uterine bleeding   Past Medical History:  Diagnosis Date  . Anemia    Anemia of chronic disease  . Anxiety    Panic disorder  . Chronic back pain   . Chronic pain syndrome   . Depression   . Essential hypertension   . Fibromyalgia   . Gastritis    Chronic, per biopsy  . Gastroesophageal reflux disease   . Inappropriate sinus tachycardia   . Migraine    "q 2-3 months" (10/15/2013)  . Osteoarthritis of both knees    Tricompartmental, worse in lateral compartment  . Osteoarthritis of both shoulders    Mild  . Osteopenia  DEXA 05/06/2008: L spine T -1.0, Left femur T -0.9, Right femur T -1.1  . Refusal of blood transfusions as patient is Jehovah's Witness   . Seasonal allergic rhinitis    Spring time  . Toe fracture 2001/2002   Left pinky toe  . Type II diabetes mellitus ([place]) 6/09   LMP 03/08/2002   Opioid Risk Score:   Fall Risk Score:  `1  Depression screen PHQ 2/9  Depression screen [facility] 2/9 03/18/2016 12/22/2015 10/02/2015 07/08/2015 07/01/2015 06/04/2015 01/14/2015  Decreased Interest 3 0 0 0 3 3 3   Down, Depressed, Hopeless 3 2 0 0 3 3 3   PHQ - 2 Score 6 2 0 0 6 6 6   Altered sleeping - 1 - - - - -  Tired, decreased energy - 3 - - - - -  Change in appetite - 0 - - - - -  Feeling bad or failure about yourself  - 1 - - - - -  Trouble concentrating - 3 - - - - -  Moving slowly or fidgety/restless - 3  - - - - -  Suicidal thoughts - 0 - - - - -  PHQ-9 Score - 13 - - - - -  Difficult doing work/chores - Very difficult - - - - -  Some recent data might be hidden   Review of Systems  Constitutional: Negative.   HENT: Negative.   Eyes: Negative.   Respiratory: Negative.   Cardiovascular: Negative.   Gastrointestinal: Negative.   Endocrine: Negative.   Genitourinary: Negative.   Musculoskeletal: Negative.   Skin: Negative.   Allergic/Immunologic: Negative.   Neurological: Negative.   Hematological: Negative.   Psychiatric/Behavioral: Negative.   All other systems reviewed and are negative.      Objective:   Physical Exam  Constitutional: She is oriented to person, place, and time. She appears well-developed and well-nourished.  HENT:  Head: Normocephalic and atraumatic.  Neck: Normal range of motion. Neck supple.  Cardiovascular: Normal rate and regular rhythm.   Pulmonary/Chest: Effort normal and breath sounds normal.  Musculoskeletal:  Normal Muscle Bulk and Muscle Testing Reveals: Upper Extremities: Full ROM and Muscle Strength 5/5 Thoracic Paraspinal Tenderness: T-3-T-7 Lumbar Paraspinal Tenderness: L-3-L-5 Lower Extremities: Full ROM and Muscle Strength 5/5 Right Lower Extremity Flexion Produces Pain into Patella Left Lower Extremity Flexion: Produces Pain Into left Lower Extremity Arises from Table slowly using single crutch   Narrow Based gait   Neurological: She is alert and oriented to person, place, and time.  Skin: Skin is warm and dry.  Psychiatric: She has a normal mood and affect.  Nursing note and vitals reviewed.         Assessment & Plan:  1. Fibromyalgia syndrome: Continue with exercise, heat and ice therapy.  Continue Gabapentin. 2. 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 We will continue the opioid monitoring program, this consists of regular clinic  visits, examinations, urine drug screen, pill counts as well as use of [place] Controlled Substance Reporting System. 3. Myofascial shoulder pain: Continue Current Medication Regime.Continue HEP 4. Depression: On Effexor. Continue to monitor.  5. History of insomnia. No Complaints Today. Continue to monitor.  6. Osteoarthritis of the knees: Continue to Monitor 7. Muscle Spasm: Continue Baclofen   20 minutes of face to face patient care time was spent during this visit. All questions were encouraged and answered.   F/U in 52month

## 2016-04-18 ENCOUNTER — Other Ambulatory Visit: Payer: Self-pay | Admitting: Registered Nurse

## 2016-04-18 MED ORDER — ACCU-CHEK NANO SMARTVIEW W/DEVICE KIT: PACK | 0 refills | Status: AC

## 2016-04-19 NOTE — Progress Notes (Signed)
Internal Medicine Clinic Attending  Case discussed with Dr. Truong at the time of the visit.  We reviewed the resident's history and exam and pertinent patient test results.  I agree with the assessment, diagnosis, and plan of care documented in the resident's note.  

## 2016-04-22 ENCOUNTER — Other Ambulatory Visit: Payer: Self-pay

## 2016-05-13 ENCOUNTER — Encounter: Payer: Medicare Other | Attending: Registered Nurse | Admitting: Registered Nurse

## 2016-05-13 ENCOUNTER — Encounter: Payer: Self-pay | Admitting: Registered Nurse

## 2016-05-13 VITALS — BP 138/83 | HR 120 | Resp 18

## 2016-05-13 DIAGNOSIS — Z760 Encounter for issue of repeat prescription: Secondary | ICD-10-CM | POA: Insufficient documentation

## 2016-05-13 DIAGNOSIS — M79604 Pain in right leg: Secondary | ICD-10-CM | POA: Insufficient documentation

## 2016-05-13 DIAGNOSIS — M170 Bilateral primary osteoarthritis of knee: Secondary | ICD-10-CM

## 2016-05-13 DIAGNOSIS — M792 Neuralgia and neuritis, unspecified: Secondary | ICD-10-CM

## 2016-05-13 DIAGNOSIS — M545 Low back pain: Secondary | ICD-10-CM | POA: Diagnosis not present

## 2016-05-13 DIAGNOSIS — G8929 Other chronic pain: Secondary | ICD-10-CM | POA: Insufficient documentation

## 2016-05-13 DIAGNOSIS — M19012 Primary osteoarthritis, left shoulder: Secondary | ICD-10-CM

## 2016-05-13 DIAGNOSIS — M19011 Primary osteoarthritis, right shoulder: Secondary | ICD-10-CM

## 2016-05-13 DIAGNOSIS — M25552 Pain in left hip: Secondary | ICD-10-CM | POA: Insufficient documentation

## 2016-05-13 DIAGNOSIS — M797 Fibromyalgia: Secondary | ICD-10-CM

## 2016-05-13 DIAGNOSIS — M62830 Muscle spasm of back: Secondary | ICD-10-CM | POA: Diagnosis not present

## 2016-05-13 DIAGNOSIS — M542 Cervicalgia: Secondary | ICD-10-CM | POA: Diagnosis not present

## 2016-05-13 DIAGNOSIS — M79605 Pain in left leg: Secondary | ICD-10-CM | POA: Diagnosis not present

## 2016-05-13 DIAGNOSIS — M25551 Pain in right hip: Secondary | ICD-10-CM | POA: Insufficient documentation

## 2016-05-13 DIAGNOSIS — G894 Chronic pain syndrome: Secondary | ICD-10-CM

## 2016-05-13 DIAGNOSIS — F3289 Other specified depressive episodes: Secondary | ICD-10-CM

## 2016-05-13 DIAGNOSIS — M5416 Radiculopathy, lumbar region: Secondary | ICD-10-CM

## 2016-05-13 MED ORDER — GABAPENTIN 100 MG PO CAPS: 100.0000 mg | ORAL_CAPSULE | Freq: Three times a day (TID) | ORAL | 2 refills | Status: AC

## 2016-05-13 MED ORDER — HYDROCODONE-ACETAMINOPHEN 10-325 MG PO TABS: 1.0000 | ORAL_TABLET | Freq: Two times a day (BID) | ORAL | 0 refills | Status: DC | PRN

## 2016-05-13 NOTE — Progress Notes (Signed)
Subjective:    Patient ID: Morgan Wilkerson, female    DOB: [birth year], 61 y.o.   MRN: [id number]  HPI: [name] is a 61year old female who returns for follow up appointment for chronic pain and medication refill. She states her pain is located in her neck radiating into her right shoulder and right arm and lower back radiating into bilateral lower extremities laterally. Also states she has generalized pain all over.  She states she's taking her gabapentin, I placed a call to the pharmacy, her last gabapentin was picked up in July 2017. Gabapentin re-ordered today, she verbalizes understanding. She rates her pain 10. Her current exercise regime is performing sit-ups, stretching exercises using bands and walking.   Pain Inventory Average Pain 10 Pain Right Now 10 My pain is constant, sharp, stabbing and aching  In the last 24 hours, has pain interfered with the following? General activity 10 Relation with others 10 Enjoyment of life 10 What TIME of day is your pain at its worst? All Sleep (in general) Poor  Pain is worse with: walking, bending, sitting, inactivity, standing and some activites Pain improves with: rest, heat/ice and medication Relief from Meds: 6  Mobility walk with assistance how many minutes can you walk? 20 do you drive?  no use a wheelchair Do you have any goals in this area?  yes  Function I need assistance with the following:  dressing, bathing, meal prep and household duties Do you have any goals in this area?  yes  Neuro/Psych weakness numbness tremor tingling trouble walking spasms dizziness confusion anxiety  Prior Studies Any changes since last visit?  yes  Physicians involved in your care Any changes since last visit?  no   Family History  Problem Relation Age of Onset  . Hypertension Mother   . Cerebral aneurysm Maternal Aunt   . Early death Father 42    Raod accident  . Healthy Sister   . Healthy Brother   . Healthy  Daughter   . Cerebral aneurysm Maternal Grandfather   . Cerebral aneurysm Maternal Aunt   . Healthy Sister   . Healthy Sister   . Healthy Sister   . Healthy Sister   . Healthy Brother   . Healthy Brother   . Healthy Grandchild    Social History   Social History  . Marital status: Married    Spouse name: N/A  . Number of children: N/A  . Years of education: 69   Occupational History  .  Unemployed   Social History Main Topics  . Smoking status: Never Smoker  . Smokeless tobacco: Never Used  . Alcohol use No     Comment: Rarely.   . Drug use: No  . Sexual activity: Not Currently   Other Topics Concern  . None   Social History Narrative   Patients phone number is [phone]   Past Surgical History:  Procedure Laterality Date  . CHOLECYSTECTOMY     for chronic acalculus cholecystitis  . CYSTOSCOPY     With hydrolic bladder distension  . TEMPORAL ARTERY BIOPSY / LIGATION Left 6/09   Negative for temperal arteritis  . TUBAL LIGATION    . VAGINAL HYSTERECTOMY  2003   with oophorectomy, for dysfunctional uterine bleeding   Past Medical History:  Diagnosis Date  . Anemia    Anemia of chronic disease  . Anxiety    Panic disorder  . Chronic back pain   . Chronic pain syndrome   .  Depression   . Essential hypertension   . Fibromyalgia   . Gastritis    Chronic, per biopsy  . Gastroesophageal reflux disease   . Inappropriate sinus tachycardia   . Migraine    "q 2-3 months" (10/15/2013)  . Osteoarthritis of both knees    Tricompartmental, worse in lateral compartment  . Osteoarthritis of both shoulders    Mild  . Osteopenia    DEXA 05/06/2008: L spine T -1.0, Left femur T -0.9, Right femur T -1.1  . Refusal of blood transfusions as patient is Jehovah's Witness   . Seasonal allergic rhinitis    Spring time  . Toe fracture 2001/2002   Left pinky toe  . Type II diabetes mellitus ([place]) 6/09   BP 136/81   Pulse (!) 119   Resp 18   LMP 03/08/2002   SpO2  98%   Opioid Risk Score:   Fall Risk Score:  `1  Depression screen PHQ 2/9  Depression screen [facility] 2/9 03/18/2016 12/22/2015 10/02/2015 07/08/2015 07/01/2015 06/04/2015 01/14/2015  Decreased Interest 3 0 0 0 3 3 3   Down, Depressed, Hopeless 3 2 0 0 3 3 3   PHQ - 2 Score 6 2 0 0 6 6 6   Altered sleeping - 1 - - - - -  Tired, decreased energy - 3 - - - - -  Change in appetite - 0 - - - - -  Feeling bad or failure about yourself  - 1 - - - - -  Trouble concentrating - 3 - - - - -  Moving slowly or fidgety/restless - 3 - - - - -  Suicidal thoughts - 0 - - - - -  PHQ-9 Score - 13 - - - - -  Difficult doing work/chores - Very difficult - - - - -  Some recent data might be hidden     Review of Systems  Constitutional: Positive for appetite change, chills and fever.  Respiratory: Positive for apnea, cough and shortness of breath.   Gastrointestinal: Positive for abdominal pain, constipation and nausea.  Endocrine:       High blood sugar Low blood sugar  All other systems reviewed and are negative.      Objective:   Physical Exam  Constitutional: She is oriented to person, place, and time. She appears well-developed and well-nourished.  HENT:  Head: Normocephalic and atraumatic.  Neck: Normal range of motion. Neck supple.  Cardiovascular: Normal rate and regular rhythm.   Pulmonary/Chest: Effort normal and breath sounds normal.  Musculoskeletal:  Normal Muscle Bulk and Muscle Testing Reveals: Upper Extremities: Right Decreased ROM 90 Degrees and Muscle Strength 4/5  Left: Full ROM and Muscle Strength 5/5 Thoracic Paraspinal Tenderness: T-1-T-3 T-7-T-9 Lumbar Paraspinal Tenderness: L-3-L-5 Lower Extremities: Right: Full ROM and Muscle Strength 5/5 Left: Decreased ROM and Muscle Strength 5/5 Left Lower Extremity Flexion Produces Pain into Extremity Arises from Table slowly using single crutch for support Narrow Based Gait  Neurological: She is alert and oriented to person, place, and  time.  Skin: Skin is warm and dry.  Psychiatric: She has a normal mood and affect.  Nursing note and vitals reviewed.         Assessment & Plan:  1. Fibromyalgia syndrome: Continue with exercise, heat and ice therapy.  Continue Gabapentin. 2. 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 We will continue the opioid monitoring program, this consists of regular clinic visits,  examinations, urine drug screen, pill counts as well as use of [place] Controlled Substance Reporting System. 3. Myofascial shoulder pain: Continue Current Medication Regime.Continue HEP 4. Depression: On Effexor. Continue to monitor.  5. History of insomnia. No Complaints Today. Continue to monitor.  6. Osteoarthritis of the knees: Continue to Monitor 7. Muscle Spasm: Continue Baclofen  8. Neuropathic Pain: RX: Gabapentin 9. Bilateral Knee Pain: Continue Current Medication Regime  20 minutes of face to face patient care time was spent during this visit. All questions were encouraged and answered.   F/U in 68month

## 2016-05-17 ENCOUNTER — Encounter: Payer: Self-pay | Admitting: Internal Medicine

## 2016-05-17 DIAGNOSIS — F1120 Opioid dependence, uncomplicated: Secondary | ICD-10-CM | POA: Insufficient documentation

## 2016-05-26 ENCOUNTER — Telehealth: Payer: Self-pay | Admitting: Registered Nurse

## 2016-05-26 NOTE — Telephone Encounter (Signed)
On January 18,2018 NCCSR was reviewed: No conflict was seen on the [place] with Multiple Prescribers. Morgan Wilkerson has a signed  Narcotic Contract with our office. If there were any discrepancies this would have been  reported to her  Physcian.

## 2016-06-09 ENCOUNTER — Encounter: Payer: Medicare Other | Attending: Registered Nurse | Admitting: Registered Nurse

## 2016-06-09 ENCOUNTER — Encounter: Payer: Self-pay | Admitting: Registered Nurse

## 2016-06-09 VITALS — BP 127/83 | HR 69

## 2016-06-09 DIAGNOSIS — M62830 Muscle spasm of back: Secondary | ICD-10-CM

## 2016-06-09 DIAGNOSIS — Z760 Encounter for issue of repeat prescription: Secondary | ICD-10-CM | POA: Insufficient documentation

## 2016-06-09 DIAGNOSIS — M25552 Pain in left hip: Secondary | ICD-10-CM | POA: Insufficient documentation

## 2016-06-09 DIAGNOSIS — G894 Chronic pain syndrome: Secondary | ICD-10-CM

## 2016-06-09 DIAGNOSIS — M79605 Pain in left leg: Secondary | ICD-10-CM | POA: Diagnosis not present

## 2016-06-09 DIAGNOSIS — M542 Cervicalgia: Secondary | ICD-10-CM | POA: Insufficient documentation

## 2016-06-09 DIAGNOSIS — M25512 Pain in left shoulder: Secondary | ICD-10-CM

## 2016-06-09 DIAGNOSIS — M79604 Pain in right leg: Secondary | ICD-10-CM | POA: Diagnosis not present

## 2016-06-09 DIAGNOSIS — G8929 Other chronic pain: Secondary | ICD-10-CM | POA: Diagnosis not present

## 2016-06-09 DIAGNOSIS — M545 Low back pain, unspecified: Secondary | ICD-10-CM

## 2016-06-09 DIAGNOSIS — M797 Fibromyalgia: Secondary | ICD-10-CM | POA: Diagnosis not present

## 2016-06-09 DIAGNOSIS — Z5181 Encounter for therapeutic drug level monitoring: Secondary | ICD-10-CM

## 2016-06-09 DIAGNOSIS — Z79899 Other long term (current) drug therapy: Secondary | ICD-10-CM

## 2016-06-09 DIAGNOSIS — M792 Neuralgia and neuritis, unspecified: Secondary | ICD-10-CM

## 2016-06-09 DIAGNOSIS — M25551 Pain in right hip: Secondary | ICD-10-CM | POA: Insufficient documentation

## 2016-06-09 MED ORDER — HYDROCODONE-ACETAMINOPHEN 10-325 MG PO TABS: 1.0000 | ORAL_TABLET | Freq: Three times a day (TID) | ORAL | 0 refills | Status: AC | PRN

## 2016-06-09 NOTE — Patient Instructions (Signed)
Increase Gabapentin to two capsules in the morning one capsule in the afternoon and two capsules at bedtime  Call Office Next week to evaluate [phone]

## 2016-06-09 NOTE — Progress Notes (Signed)
Subjective:    Patient ID: Morgan Wilkerson, female    DOB: 02/03/1956, 61 y.o.   MRN: [id number]  HPI: [name] is a 61year old female who returns for follow up appointment for chronic pain and medication refill. She states her pain is located in her neck radiating into her left shoulder and lower back radiating into bilateral lower extremities laterally. Also states she had Dental surgery with teeth extraction on yesterday. We will increase her Hydrocodone tablets this month and next month decreased to #60.  She rates her pain 9. Her current exercise regime is  stretching exercises using bands and walking.   Pain Inventory Average Pain 9 Pain Right Now 9 My pain is .  In the last 24 hours, has pain interfered with the following? General activity . Relation with others . Enjoyment of life . What TIME of day is your pain at its worst? . Sleep (in general) .  Pain is worse with: . Pain improves with: . Relief from Meds: .  Mobility use a cane do you drive?  no use a wheelchair  Function I need assistance with the following:  bathing, meal prep, household duties and shopping  Neuro/Psych numbness tingling trouble walking spasms dizziness anxiety loss of taste or smell  Prior Studies Any changes since last visit?  no  Physicians involved in your care Any changes since last visit?  no   Family History  Problem Relation Age of Onset  . Hypertension Mother   . Cerebral aneurysm Maternal Aunt   . Early death Father 69    Raod accident  . Healthy Sister   . Healthy Brother   . Healthy Daughter   . Cerebral aneurysm Maternal Grandfather   . Cerebral aneurysm Maternal Aunt   . Healthy Sister   . Healthy Sister   . Healthy Sister   . Healthy Sister   . Healthy Brother   . Healthy Brother   . Healthy Grandchild    Social History   Social History  . Marital status: Married    Spouse name: N/A  . Number of children: N/A  . Years of education: 26    Occupational History  .  Unemployed   Social History Main Topics  . Smoking status: Never Smoker  . Smokeless tobacco: Never Used  . Alcohol use No     Comment: Rarely.   . Drug use: No  . Sexual activity: Not Currently   Other Topics Concern  . Not on file   Social History Narrative   Patients phone number is [phone]   Past Surgical History:  Procedure Laterality Date  . CHOLECYSTECTOMY     for chronic acalculus cholecystitis  . CYSTOSCOPY     With hydrolic bladder distension  . TEMPORAL ARTERY BIOPSY / LIGATION Left 6/09   Negative for temperal arteritis  . TUBAL LIGATION    . VAGINAL HYSTERECTOMY  2003   with oophorectomy, for dysfunctional uterine bleeding   Past Medical History:  Diagnosis Date  . Anemia    Anemia of chronic disease  . Anxiety    Panic disorder  . Chronic back pain   . Chronic pain syndrome   . Depression   . Essential hypertension   . Fibromyalgia   . Gastritis    Chronic, per biopsy  . Gastroesophageal reflux disease   . Inappropriate sinus tachycardia   . Migraine    "q 2-3 months" (10/15/2013)  . Osteoarthritis of both knees  Tricompartmental, worse in lateral compartment  . Osteoarthritis of both shoulders    Mild  . Osteopenia    DEXA 05/06/2008: L spine T -1.0, Left femur T -0.9, Right femur T -1.1  . Refusal of blood transfusions as patient is Jehovah's Witness   . Seasonal allergic rhinitis    Spring time  . Toe fracture 2001/2002   Left pinky toe  . Type II diabetes mellitus ([place]) 6/09   LMP 03/08/2002   Opioid Risk Score:   Fall Risk Score:  `1  Depression screen PHQ 2/9  Depression screen [facility] 2/9 03/18/2016 12/22/2015 10/02/2015 07/08/2015 07/01/2015 06/04/2015 01/14/2015  Decreased Interest 3 0 0 0 3 3 3   Down, Depressed, Hopeless 3 2 0 0 3 3 3   PHQ - 2 Score 6 2 0 0 6 6 6   Altered sleeping - 1 - - - - -  Tired, decreased energy - 3 - - - - -  Change in appetite - 0 - - - - -  Feeling bad or failure about  yourself  - 1 - - - - -  Trouble concentrating - 3 - - - - -  Moving slowly or fidgety/restless - 3 - - - - -  Suicidal thoughts - 0 - - - - -  PHQ-9 Score - 13 - - - - -  Difficult doing work/chores - Very difficult - - - - -  Some recent data might be hidden   Review of Systems  Constitutional: Negative.   HENT: Negative.   Eyes: Negative.   Respiratory: Negative.   Cardiovascular: Negative.   Gastrointestinal: Negative.   Endocrine: Negative.   Genitourinary: Negative.   Musculoskeletal: Positive for back pain and gait problem.  Skin: Negative.   Allergic/Immunologic: Negative.   Neurological: Positive for dizziness and numbness.  Hematological: Negative.   Psychiatric/Behavioral: The patient is nervous/anxious.   All other systems reviewed and are negative.      Objective:   Physical Exam  Constitutional: She appears well-developed and well-nourished.  HENT:  Head: Normocephalic and atraumatic.  Neck: Normal range of motion. Neck supple.  Cardiovascular: Normal rate and regular rhythm.   Pulmonary/Chest: Effort normal and breath sounds normal.  Musculoskeletal:  Normal Muscle Bulk and Muscle Testing Reveals" Upper Extremities: Right: Decreased ROM: 45 Degrees and Muscle Strength 4/5 Left: Full ROM and Muscle Strength 5/5 Right AC Joint Tenderness Thoracic Paraspinal Tenderness: T-3-T-5 Lumbar Paraspinal Tenderness: L-3-L-5 Lower Extremities: Full ROM and Muscle Strength 5/5 Arises from Table Slowly using Single Crutch for support Narrow Based Gait   Neurological: She is alert.  Skin: Skin is warm and dry.  Psychiatric: She has a normal mood and affect.  Nursing note and vitals reviewed.         Assessment & Plan:  1. Fibromyalgia syndrome: Continue with exercise, heat and ice therapy.  Continue Gabapentin. 06/10/2016 2. 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 Decreased to 60 tablets next month. We will continue the opioid monitoring program, this consists of regular clinic visits, examinations, urine drug screen, pill counts as well as use of [place] Controlled Substance Reporting System. 3. Myofascial shoulder pain: Continue Current Medication Regime.Continue HEP. 06/28/2016 4. Depression: On Effexor. Continue to monitor. 06/10/2016 5. History of insomnia. No Complaints Today. Continue to monitor. 06/10/2016 6. Osteoarthritis of the knees: Continue to Monitor. 06/10/2016 7. Muscle Spasm: Continue Baclofen. 06/10/2016 8. Neuropathic Pain: Continue: Gabapentin.  06/10/2016 9. Bilateral Knee Pain: No complaints today. Continue Current Medication Regime 10. Dental Surgery/ Teeth Extraction: Hydrocodone increased this month.  20 minutes of face to face patient care time was spent during this visit. All questions were encouraged and answered.  F/U in 51month

## 2016-06-10 ENCOUNTER — Encounter: Payer: Medicare Other | Admitting: Registered Nurse

## 2016-07-13 ENCOUNTER — Encounter: Payer: Medicare Other | Attending: Registered Nurse | Admitting: Physical Medicine & Rehabilitation

## 2016-07-13 DIAGNOSIS — M545 Low back pain: Secondary | ICD-10-CM | POA: Insufficient documentation

## 2016-07-13 DIAGNOSIS — M25552 Pain in left hip: Secondary | ICD-10-CM | POA: Insufficient documentation

## 2016-07-13 DIAGNOSIS — G8929 Other chronic pain: Secondary | ICD-10-CM | POA: Insufficient documentation

## 2016-07-13 DIAGNOSIS — M79605 Pain in left leg: Secondary | ICD-10-CM | POA: Insufficient documentation

## 2016-07-13 DIAGNOSIS — M542 Cervicalgia: Secondary | ICD-10-CM | POA: Insufficient documentation

## 2016-07-13 DIAGNOSIS — M25551 Pain in right hip: Secondary | ICD-10-CM | POA: Insufficient documentation

## 2016-07-13 DIAGNOSIS — Z760 Encounter for issue of repeat prescription: Secondary | ICD-10-CM | POA: Insufficient documentation

## 2016-07-13 DIAGNOSIS — M79604 Pain in right leg: Secondary | ICD-10-CM | POA: Insufficient documentation

## 2016-07-17 DIAGNOSIS — Z79899 Other long term (current) drug therapy: Secondary | ICD-10-CM | POA: Diagnosis not present

## 2016-07-17 DIAGNOSIS — R000 Tachycardia, unspecified: Secondary | ICD-10-CM | POA: Diagnosis not present

## 2016-07-17 DIAGNOSIS — I160 Hypertensive urgency: Secondary | ICD-10-CM | POA: Diagnosis not present

## 2016-07-17 DIAGNOSIS — M797 Fibromyalgia: Secondary | ICD-10-CM | POA: Diagnosis not present

## 2016-07-17 DIAGNOSIS — I10 Essential (primary) hypertension: Secondary | ICD-10-CM | POA: Diagnosis not present

## 2016-07-17 DIAGNOSIS — R29818 Other symptoms and signs involving the nervous system: Secondary | ICD-10-CM | POA: Diagnosis not present

## 2016-07-17 DIAGNOSIS — E119 Type 2 diabetes mellitus without complications: Secondary | ICD-10-CM | POA: Diagnosis not present

## 2016-07-17 DIAGNOSIS — Z79891 Long term (current) use of opiate analgesic: Secondary | ICD-10-CM | POA: Diagnosis not present

## 2016-07-17 DIAGNOSIS — R410 Disorientation, unspecified: Secondary | ICD-10-CM | POA: Diagnosis not present

## 2016-07-17 DIAGNOSIS — Z885 Allergy status to narcotic agent status: Secondary | ICD-10-CM | POA: Diagnosis not present

## 2016-10-14 DIAGNOSIS — J3089 Other allergic rhinitis: Secondary | ICD-10-CM | POA: Diagnosis not present

## 2016-10-14 DIAGNOSIS — I10 Essential (primary) hypertension: Secondary | ICD-10-CM | POA: Diagnosis not present

## 2016-10-14 DIAGNOSIS — E119 Type 2 diabetes mellitus without complications: Secondary | ICD-10-CM | POA: Diagnosis not present

## 2016-10-14 DIAGNOSIS — Z1389 Encounter for screening for other disorder: Secondary | ICD-10-CM | POA: Diagnosis not present

## 2016-10-14 DIAGNOSIS — G894 Chronic pain syndrome: Secondary | ICD-10-CM | POA: Diagnosis not present

## 2016-10-18 ENCOUNTER — Encounter: Payer: Self-pay | Admitting: *Deleted

## 2016-11-22 DIAGNOSIS — I10 Essential (primary) hypertension: Secondary | ICD-10-CM | POA: Diagnosis not present

## 2016-11-22 DIAGNOSIS — Z1159 Encounter for screening for other viral diseases: Secondary | ICD-10-CM | POA: Diagnosis not present

## 2016-11-22 DIAGNOSIS — G894 Chronic pain syndrome: Secondary | ICD-10-CM | POA: Diagnosis not present

## 2016-11-22 DIAGNOSIS — E119 Type 2 diabetes mellitus without complications: Secondary | ICD-10-CM | POA: Diagnosis not present

## 2016-11-22 DIAGNOSIS — R200 Anesthesia of skin: Secondary | ICD-10-CM | POA: Diagnosis not present

## 2017-01-06 DIAGNOSIS — I10 Essential (primary) hypertension: Secondary | ICD-10-CM | POA: Diagnosis not present

## 2017-01-06 DIAGNOSIS — G894 Chronic pain syndrome: Secondary | ICD-10-CM | POA: Diagnosis not present

## 2017-01-06 DIAGNOSIS — M25569 Pain in unspecified knee: Secondary | ICD-10-CM | POA: Diagnosis not present

## 2017-01-06 DIAGNOSIS — M791 Myalgia: Secondary | ICD-10-CM | POA: Diagnosis not present

## 2017-01-06 DIAGNOSIS — R200 Anesthesia of skin: Secondary | ICD-10-CM | POA: Diagnosis not present

## 2017-01-06 DIAGNOSIS — D649 Anemia, unspecified: Secondary | ICD-10-CM | POA: Diagnosis not present

## 2017-01-06 DIAGNOSIS — K921 Melena: Secondary | ICD-10-CM | POA: Diagnosis not present

## 2017-01-06 DIAGNOSIS — Z1389 Encounter for screening for other disorder: Secondary | ICD-10-CM | POA: Diagnosis not present

## 2017-01-12 IMAGING — DX DG CHEST 2V
2 series · 2 of 2 positions shown · non-contrast
Comparison: 05/28/2015

CLINICAL DATA: Confusion.  For geriatric psychologic placement.

EXAM:
CHEST  2 VIEW

[w chest pa]
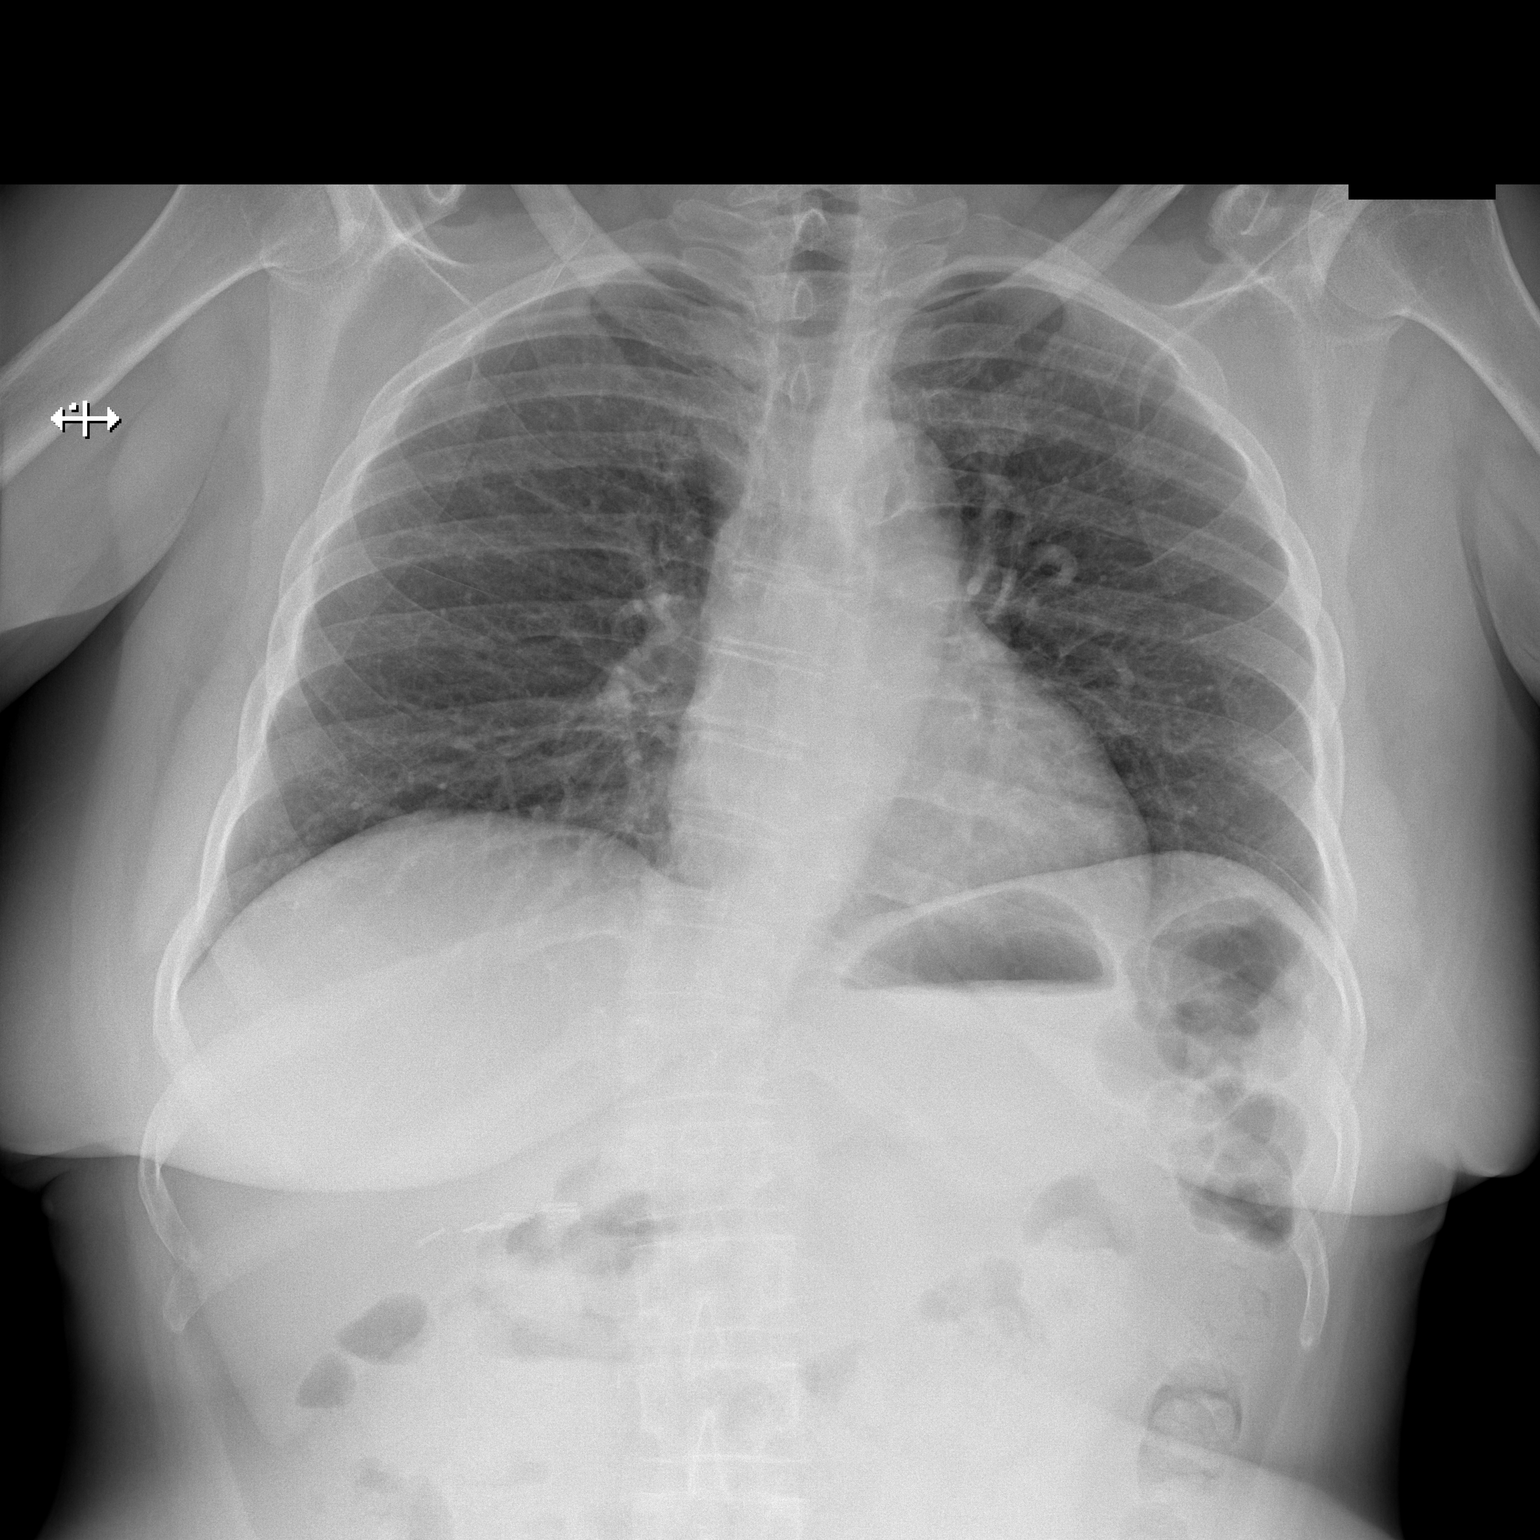

[w chest lat]
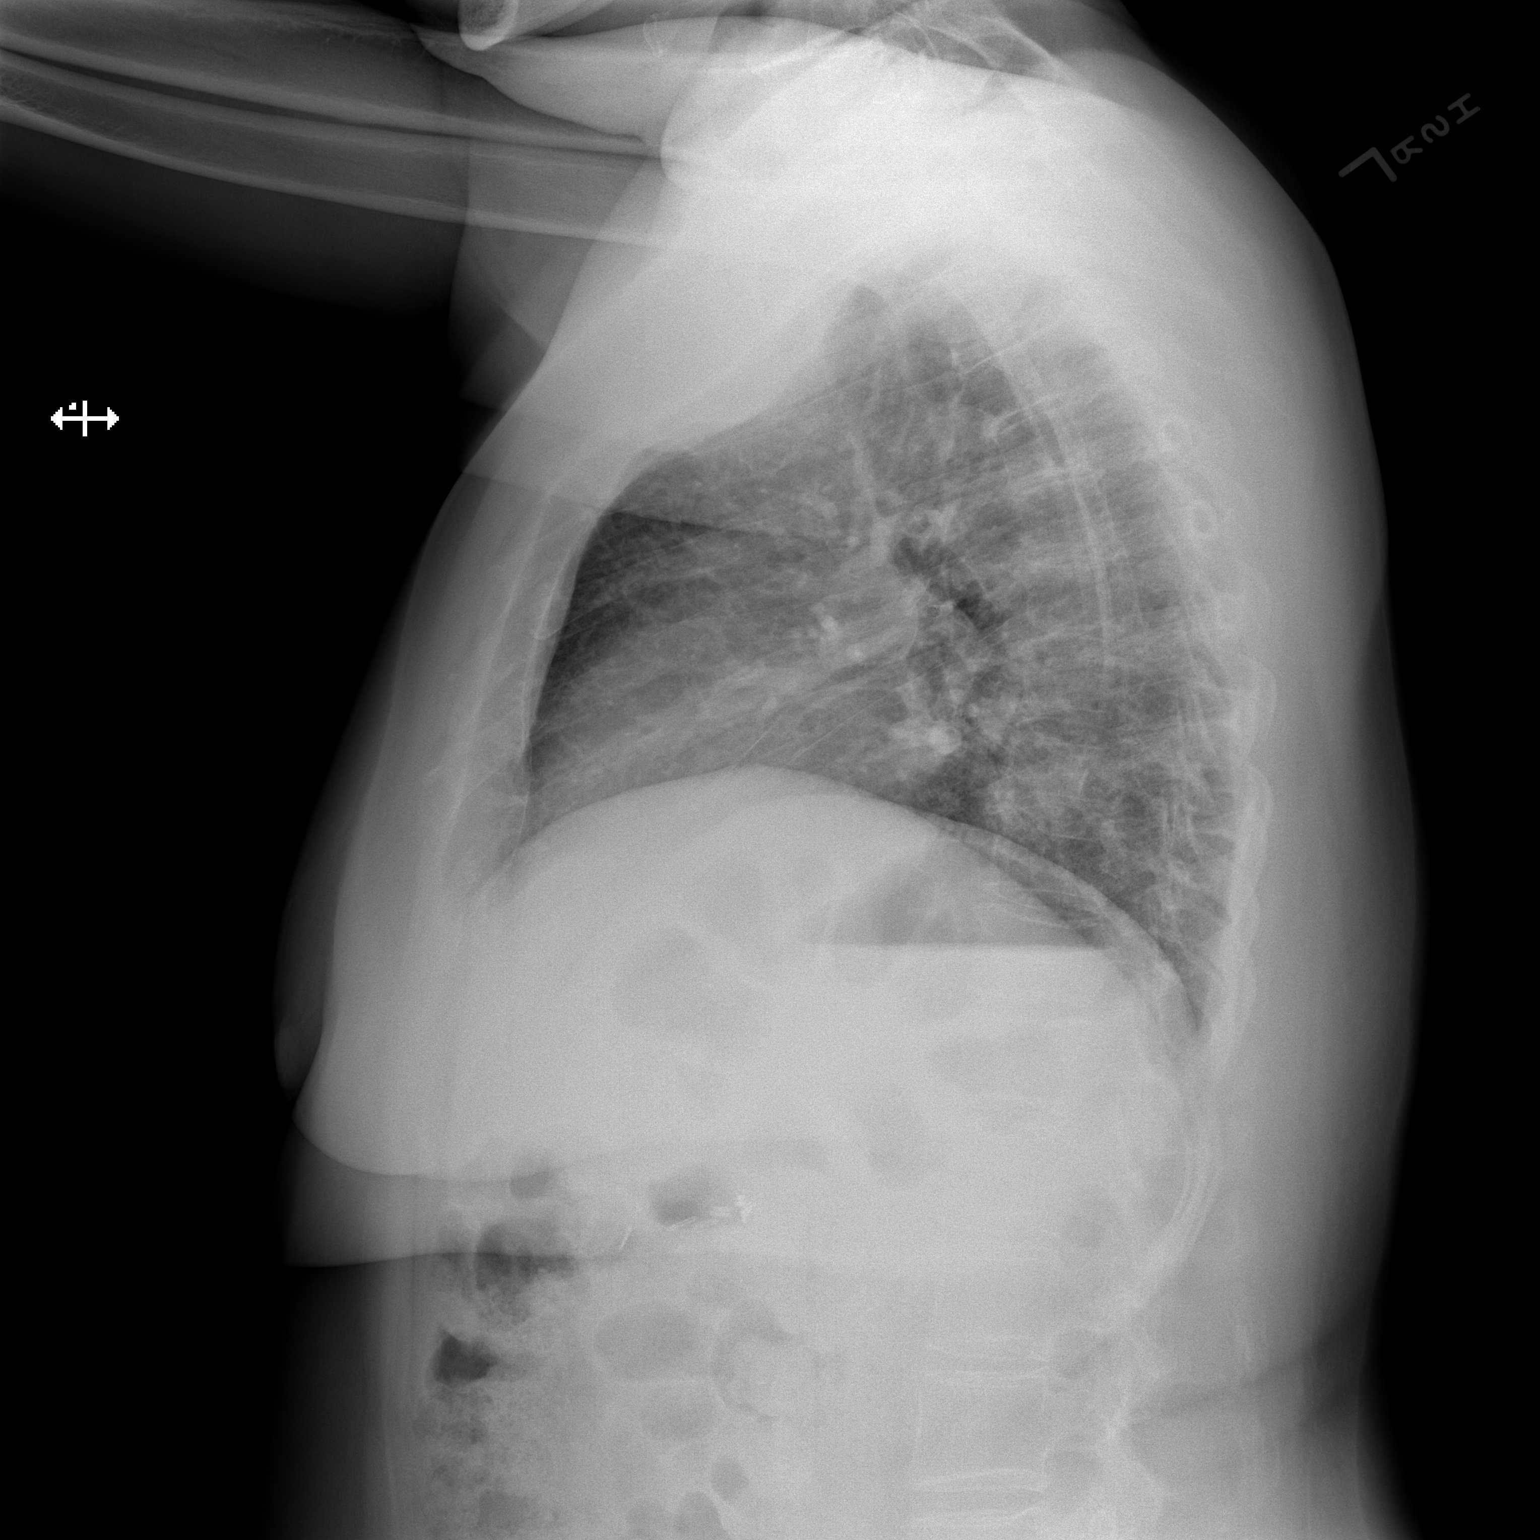

[2 of 2 positions shown; findings below may reference images not displayed]

FINDINGS: The cardiac silhouette is normal in size and configuration. No
mediastinal or hilar masses or evidence of adenopathy.

Clear lungs.  No pleural effusion or pneumothorax.

Bony thorax is intact.
IMPRESSION: No active cardiopulmonary disease.

## 2017-01-20 ENCOUNTER — Encounter: Payer: Self-pay | Admitting: Gastroenterology

## 2017-02-15 DIAGNOSIS — I10 Essential (primary) hypertension: Secondary | ICD-10-CM | POA: Diagnosis not present

## 2017-02-15 DIAGNOSIS — D649 Anemia, unspecified: Secondary | ICD-10-CM | POA: Diagnosis not present

## 2017-02-15 DIAGNOSIS — K921 Melena: Secondary | ICD-10-CM | POA: Diagnosis not present

## 2017-02-22 DIAGNOSIS — D649 Anemia, unspecified: Secondary | ICD-10-CM | POA: Diagnosis not present

## 2017-02-22 DIAGNOSIS — K921 Melena: Secondary | ICD-10-CM | POA: Diagnosis not present

## 2017-02-22 DIAGNOSIS — M791 Myalgia, unspecified site: Secondary | ICD-10-CM | POA: Diagnosis not present

## 2017-02-27 DIAGNOSIS — D649 Anemia, unspecified: Secondary | ICD-10-CM | POA: Diagnosis not present

## 2017-02-27 DIAGNOSIS — K921 Melena: Secondary | ICD-10-CM | POA: Diagnosis not present

## 2017-02-27 DIAGNOSIS — Z23 Encounter for immunization: Secondary | ICD-10-CM | POA: Diagnosis not present

## 2017-02-27 DIAGNOSIS — M25511 Pain in right shoulder: Secondary | ICD-10-CM | POA: Diagnosis not present

## 2017-02-27 DIAGNOSIS — M791 Myalgia, unspecified site: Secondary | ICD-10-CM | POA: Diagnosis not present

## 2017-03-17 DIAGNOSIS — K921 Melena: Secondary | ICD-10-CM | POA: Diagnosis not present

## 2017-03-17 DIAGNOSIS — M791 Myalgia, unspecified site: Secondary | ICD-10-CM | POA: Diagnosis not present

## 2017-03-17 DIAGNOSIS — M25569 Pain in unspecified knee: Secondary | ICD-10-CM | POA: Diagnosis not present

## 2017-03-17 DIAGNOSIS — E119 Type 2 diabetes mellitus without complications: Secondary | ICD-10-CM | POA: Diagnosis not present

## 2017-03-17 DIAGNOSIS — D649 Anemia, unspecified: Secondary | ICD-10-CM | POA: Diagnosis not present

## 2017-03-27 DIAGNOSIS — Z7409 Other reduced mobility: Secondary | ICD-10-CM | POA: Diagnosis not present

## 2017-07-17 DIAGNOSIS — K210 Gastro-esophageal reflux disease with esophagitis: Secondary | ICD-10-CM | POA: Diagnosis not present

## 2017-07-17 DIAGNOSIS — K2930 Chronic superficial gastritis without bleeding: Secondary | ICD-10-CM | POA: Diagnosis not present

## 2017-07-17 DIAGNOSIS — K449 Diaphragmatic hernia without obstruction or gangrene: Secondary | ICD-10-CM | POA: Diagnosis not present

## 2017-07-17 DIAGNOSIS — Z1211 Encounter for screening for malignant neoplasm of colon: Secondary | ICD-10-CM | POA: Diagnosis not present

## 2017-07-17 DIAGNOSIS — D123 Benign neoplasm of transverse colon: Secondary | ICD-10-CM | POA: Diagnosis not present

## 2017-07-17 DIAGNOSIS — K2960 Other gastritis without bleeding: Secondary | ICD-10-CM | POA: Diagnosis not present

## 2017-07-17 DIAGNOSIS — I10 Essential (primary) hypertension: Secondary | ICD-10-CM | POA: Diagnosis not present

## 2017-07-17 DIAGNOSIS — E119 Type 2 diabetes mellitus without complications: Secondary | ICD-10-CM | POA: Diagnosis not present

## 2017-07-17 DIAGNOSIS — D509 Iron deficiency anemia, unspecified: Secondary | ICD-10-CM | POA: Diagnosis not present

## 2017-07-17 DIAGNOSIS — D369 Benign neoplasm, unspecified site: Secondary | ICD-10-CM | POA: Diagnosis not present

## 2017-07-17 DIAGNOSIS — K5730 Diverticulosis of large intestine without perforation or abscess without bleeding: Secondary | ICD-10-CM | POA: Diagnosis not present

## 2017-07-17 DIAGNOSIS — K921 Melena: Secondary | ICD-10-CM | POA: Diagnosis not present

## 2017-07-17 DIAGNOSIS — K641 Second degree hemorrhoids: Secondary | ICD-10-CM | POA: Diagnosis not present

## 2017-07-17 DIAGNOSIS — K644 Residual hemorrhoidal skin tags: Secondary | ICD-10-CM | POA: Diagnosis not present

## 2017-11-15 DIAGNOSIS — E119 Type 2 diabetes mellitus without complications: Secondary | ICD-10-CM | POA: Diagnosis not present

## 2017-11-15 DIAGNOSIS — Z1389 Encounter for screening for other disorder: Secondary | ICD-10-CM | POA: Diagnosis not present

## 2017-11-15 DIAGNOSIS — D649 Anemia, unspecified: Secondary | ICD-10-CM | POA: Diagnosis not present

## 2017-11-15 DIAGNOSIS — R51 Headache: Secondary | ICD-10-CM | POA: Diagnosis not present

## 2017-11-15 DIAGNOSIS — I10 Essential (primary) hypertension: Secondary | ICD-10-CM | POA: Diagnosis not present

## 2017-12-21 DIAGNOSIS — Z1389 Encounter for screening for other disorder: Secondary | ICD-10-CM | POA: Diagnosis not present

## 2017-12-21 DIAGNOSIS — E119 Type 2 diabetes mellitus without complications: Secondary | ICD-10-CM | POA: Diagnosis not present

## 2017-12-21 DIAGNOSIS — R700 Elevated erythrocyte sedimentation rate: Secondary | ICD-10-CM | POA: Diagnosis not present

## 2017-12-21 DIAGNOSIS — R3129 Other microscopic hematuria: Secondary | ICD-10-CM | POA: Diagnosis not present

## 2017-12-21 DIAGNOSIS — D649 Anemia, unspecified: Secondary | ICD-10-CM | POA: Diagnosis not present

## 2018-01-22 DIAGNOSIS — R42 Dizziness and giddiness: Secondary | ICD-10-CM | POA: Diagnosis not present

## 2018-01-22 DIAGNOSIS — I10 Essential (primary) hypertension: Secondary | ICD-10-CM | POA: Diagnosis not present

## 2018-01-22 DIAGNOSIS — M316 Other giant cell arteritis: Secondary | ICD-10-CM | POA: Diagnosis not present

## 2018-01-22 DIAGNOSIS — M545 Low back pain: Secondary | ICD-10-CM | POA: Diagnosis not present

## 2018-01-22 DIAGNOSIS — M797 Fibromyalgia: Secondary | ICD-10-CM | POA: Diagnosis not present

## 2018-01-22 DIAGNOSIS — R51 Headache: Secondary | ICD-10-CM | POA: Diagnosis not present

## 2018-01-22 DIAGNOSIS — R413 Other amnesia: Secondary | ICD-10-CM | POA: Diagnosis not present

## 2018-01-22 DIAGNOSIS — Z79899 Other long term (current) drug therapy: Secondary | ICD-10-CM | POA: Diagnosis not present

## 2018-01-22 DIAGNOSIS — R410 Disorientation, unspecified: Secondary | ICD-10-CM | POA: Diagnosis not present

## 2018-01-22 DIAGNOSIS — R110 Nausea: Secondary | ICD-10-CM | POA: Diagnosis not present

## 2018-01-22 DIAGNOSIS — E119 Type 2 diabetes mellitus without complications: Secondary | ICD-10-CM | POA: Diagnosis not present

## 2018-01-22 DIAGNOSIS — N281 Cyst of kidney, acquired: Secondary | ICD-10-CM | POA: Diagnosis not present

## 2018-01-29 DIAGNOSIS — M316 Other giant cell arteritis: Secondary | ICD-10-CM | POA: Diagnosis not present

## 2018-02-07 DIAGNOSIS — M316 Other giant cell arteritis: Secondary | ICD-10-CM | POA: Diagnosis not present

## 2018-02-09 DIAGNOSIS — H52203 Unspecified astigmatism, bilateral: Secondary | ICD-10-CM | POA: Diagnosis not present

## 2018-02-09 DIAGNOSIS — Z9889 Other specified postprocedural states: Secondary | ICD-10-CM | POA: Diagnosis not present

## 2018-02-09 DIAGNOSIS — M316 Other giant cell arteritis: Secondary | ICD-10-CM | POA: Diagnosis not present

## 2018-02-09 DIAGNOSIS — H2513 Age-related nuclear cataract, bilateral: Secondary | ICD-10-CM | POA: Diagnosis not present

## 2018-02-09 DIAGNOSIS — E119 Type 2 diabetes mellitus without complications: Secondary | ICD-10-CM | POA: Diagnosis not present

## 2018-02-15 DIAGNOSIS — R9431 Abnormal electrocardiogram [ECG] [EKG]: Secondary | ICD-10-CM | POA: Diagnosis not present

## 2018-02-15 DIAGNOSIS — G894 Chronic pain syndrome: Secondary | ICD-10-CM | POA: Diagnosis not present

## 2018-02-15 DIAGNOSIS — I358 Other nonrheumatic aortic valve disorders: Secondary | ICD-10-CM | POA: Diagnosis not present

## 2018-02-15 DIAGNOSIS — I10 Essential (primary) hypertension: Secondary | ICD-10-CM | POA: Diagnosis not present

## 2018-02-15 DIAGNOSIS — E1121 Type 2 diabetes mellitus with diabetic nephropathy: Secondary | ICD-10-CM | POA: Diagnosis not present

## 2018-02-15 DIAGNOSIS — I119 Hypertensive heart disease without heart failure: Secondary | ICD-10-CM | POA: Diagnosis not present

## 2018-02-16 DIAGNOSIS — R0609 Other forms of dyspnea: Secondary | ICD-10-CM | POA: Diagnosis not present

## 2018-02-16 DIAGNOSIS — I7789 Other specified disorders of arteries and arterioles: Secondary | ICD-10-CM | POA: Diagnosis not present

## 2018-02-16 DIAGNOSIS — I10 Essential (primary) hypertension: Secondary | ICD-10-CM | POA: Diagnosis not present

## 2018-02-16 DIAGNOSIS — E1121 Type 2 diabetes mellitus with diabetic nephropathy: Secondary | ICD-10-CM | POA: Diagnosis not present

## 2018-02-19 DIAGNOSIS — R0609 Other forms of dyspnea: Secondary | ICD-10-CM | POA: Diagnosis not present

## 2018-02-23 DIAGNOSIS — M316 Other giant cell arteritis: Secondary | ICD-10-CM | POA: Diagnosis not present

## 2018-02-23 DIAGNOSIS — G309 Alzheimer's disease, unspecified: Secondary | ICD-10-CM | POA: Diagnosis not present

## 2018-02-23 DIAGNOSIS — R700 Elevated erythrocyte sedimentation rate: Secondary | ICD-10-CM | POA: Diagnosis not present

## 2018-02-23 DIAGNOSIS — I10 Essential (primary) hypertension: Secondary | ICD-10-CM | POA: Diagnosis not present

## 2018-02-23 DIAGNOSIS — E1121 Type 2 diabetes mellitus with diabetic nephropathy: Secondary | ICD-10-CM | POA: Diagnosis not present

## 2018-02-23 DIAGNOSIS — R51 Headache: Secondary | ICD-10-CM | POA: Diagnosis not present

## 2018-02-23 DIAGNOSIS — Z79899 Other long term (current) drug therapy: Secondary | ICD-10-CM | POA: Diagnosis not present

## 2018-02-23 DIAGNOSIS — E119 Type 2 diabetes mellitus without complications: Secondary | ICD-10-CM | POA: Diagnosis not present

## 2018-02-28 DIAGNOSIS — M353 Polymyalgia rheumatica: Secondary | ICD-10-CM | POA: Diagnosis not present

## 2018-03-03 DIAGNOSIS — Z885 Allergy status to narcotic agent status: Secondary | ICD-10-CM | POA: Diagnosis not present

## 2018-03-03 DIAGNOSIS — G43909 Migraine, unspecified, not intractable, without status migrainosus: Secondary | ICD-10-CM | POA: Diagnosis not present

## 2018-03-03 DIAGNOSIS — Z79891 Long term (current) use of opiate analgesic: Secondary | ICD-10-CM | POA: Diagnosis not present

## 2018-03-03 DIAGNOSIS — M797 Fibromyalgia: Secondary | ICD-10-CM | POA: Diagnosis not present

## 2018-03-03 DIAGNOSIS — H53149 Visual discomfort, unspecified: Secondary | ICD-10-CM | POA: Diagnosis not present

## 2018-03-03 DIAGNOSIS — I10 Essential (primary) hypertension: Secondary | ICD-10-CM | POA: Diagnosis not present

## 2018-03-03 DIAGNOSIS — E119 Type 2 diabetes mellitus without complications: Secondary | ICD-10-CM | POA: Diagnosis not present

## 2018-03-03 DIAGNOSIS — Z886 Allergy status to analgesic agent status: Secondary | ICD-10-CM | POA: Diagnosis not present

## 2018-03-03 DIAGNOSIS — R51 Headache: Secondary | ICD-10-CM | POA: Diagnosis not present

## 2018-03-03 DIAGNOSIS — Z79899 Other long term (current) drug therapy: Secondary | ICD-10-CM | POA: Diagnosis not present

## 2018-03-21 DIAGNOSIS — R079 Chest pain, unspecified: Secondary | ICD-10-CM | POA: Diagnosis not present

## 2018-03-21 DIAGNOSIS — M797 Fibromyalgia: Secondary | ICD-10-CM | POA: Diagnosis not present

## 2018-03-21 DIAGNOSIS — I2699 Other pulmonary embolism without acute cor pulmonale: Secondary | ICD-10-CM | POA: Diagnosis not present

## 2018-03-21 DIAGNOSIS — R0602 Shortness of breath: Secondary | ICD-10-CM | POA: Diagnosis not present

## 2018-03-21 DIAGNOSIS — E119 Type 2 diabetes mellitus without complications: Secondary | ICD-10-CM | POA: Diagnosis not present

## 2018-03-21 DIAGNOSIS — Z79899 Other long term (current) drug therapy: Secondary | ICD-10-CM | POA: Diagnosis not present

## 2018-03-21 DIAGNOSIS — R000 Tachycardia, unspecified: Secondary | ICD-10-CM | POA: Diagnosis not present

## 2018-03-21 DIAGNOSIS — R0789 Other chest pain: Secondary | ICD-10-CM | POA: Diagnosis not present

## 2018-03-21 DIAGNOSIS — I10 Essential (primary) hypertension: Secondary | ICD-10-CM | POA: Diagnosis not present

## 2018-03-21 DIAGNOSIS — I700 Atherosclerosis of aorta: Secondary | ICD-10-CM | POA: Diagnosis not present

## 2018-03-21 DIAGNOSIS — R51 Headache: Secondary | ICD-10-CM | POA: Diagnosis not present

## 2018-03-21 DIAGNOSIS — I471 Supraventricular tachycardia: Secondary | ICD-10-CM | POA: Diagnosis not present

## 2018-03-22 DIAGNOSIS — I2699 Other pulmonary embolism without acute cor pulmonale: Secondary | ICD-10-CM | POA: Diagnosis not present

## 2018-03-26 DIAGNOSIS — I10 Essential (primary) hypertension: Secondary | ICD-10-CM | POA: Diagnosis not present

## 2018-03-26 DIAGNOSIS — Z0000 Encounter for general adult medical examination without abnormal findings: Secondary | ICD-10-CM | POA: Diagnosis not present

## 2018-03-26 DIAGNOSIS — Z23 Encounter for immunization: Secondary | ICD-10-CM | POA: Diagnosis not present

## 2018-03-26 DIAGNOSIS — Z1329 Encounter for screening for other suspected endocrine disorder: Secondary | ICD-10-CM | POA: Diagnosis not present

## 2018-03-26 DIAGNOSIS — I471 Supraventricular tachycardia: Secondary | ICD-10-CM | POA: Diagnosis not present

## 2018-03-26 DIAGNOSIS — Z1389 Encounter for screening for other disorder: Secondary | ICD-10-CM | POA: Diagnosis not present

## 2018-03-26 DIAGNOSIS — R700 Elevated erythrocyte sedimentation rate: Secondary | ICD-10-CM | POA: Diagnosis not present

## 2018-04-03 DIAGNOSIS — I519 Heart disease, unspecified: Secondary | ICD-10-CM | POA: Diagnosis not present

## 2018-04-03 DIAGNOSIS — I471 Supraventricular tachycardia: Secondary | ICD-10-CM | POA: Diagnosis not present

## 2018-04-23 DIAGNOSIS — Z1231 Encounter for screening mammogram for malignant neoplasm of breast: Secondary | ICD-10-CM | POA: Diagnosis not present

## 2018-05-21 DIAGNOSIS — E119 Type 2 diabetes mellitus without complications: Secondary | ICD-10-CM | POA: Diagnosis not present

## 2018-05-21 DIAGNOSIS — R002 Palpitations: Secondary | ICD-10-CM | POA: Diagnosis not present

## 2018-05-21 DIAGNOSIS — Z79899 Other long term (current) drug therapy: Secondary | ICD-10-CM | POA: Diagnosis not present

## 2018-05-21 DIAGNOSIS — Z888 Allergy status to other drugs, medicaments and biological substances status: Secondary | ICD-10-CM | POA: Diagnosis not present

## 2018-05-21 DIAGNOSIS — I471 Supraventricular tachycardia: Secondary | ICD-10-CM | POA: Diagnosis not present

## 2018-05-21 DIAGNOSIS — Z885 Allergy status to narcotic agent status: Secondary | ICD-10-CM | POA: Diagnosis not present

## 2018-05-21 DIAGNOSIS — M797 Fibromyalgia: Secondary | ICD-10-CM | POA: Diagnosis not present

## 2018-05-21 DIAGNOSIS — R42 Dizziness and giddiness: Secondary | ICD-10-CM | POA: Diagnosis not present

## 2018-05-21 DIAGNOSIS — I10 Essential (primary) hypertension: Secondary | ICD-10-CM | POA: Diagnosis not present

## 2018-05-21 DIAGNOSIS — R9431 Abnormal electrocardiogram [ECG] [EKG]: Secondary | ICD-10-CM | POA: Diagnosis not present

## 2018-08-10 ENCOUNTER — Other Ambulatory Visit: Payer: Self-pay

## 2018-08-10 NOTE — Patient Outreach (Signed)
[facility]) Care Management  08/10/2018  Morgan Wilkerson 06/10/1955 [id number]  TELEPHONE SCREENING Referral date: 08/10/18 Referral source: utilization management Referral reason: assistance with care/ possible facility placement Insurance: [employer]  Telephone call to patient regarding utilization management referral. Contact made with patients daughter and designated party release, Morgan Wilkerson. Daughter states patient has been in the emergency room x 2 due to her dementia.  Daughter states patient is currently in the hospital awaiting placement to a facility.  Daughter states patient was previously residing with her sister.  Daughter states patients sister is no longer able to care for patient. Daughter states she is living in [place] and she is waiting for contact from the hospital regarding patients patient.  Daughter states patient is in the hospital at [facility] in [place], [place].  RNCM discussed [facility] care management services and provided contact phone number.  Daughter states services with [facility] care management are not needed at this time.   PLAN: RNCM will close patient due to patient due to patient not meeting program criteria RNCM will send closure letter to patients primary MD.  Quinn Plowman RN,BSN,CCM [facility] Telephonic  [phone]

## 2019-05-16 DIAGNOSIS — Z20822 Contact with and (suspected) exposure to covid-19: Secondary | ICD-10-CM | POA: Diagnosis not present

## 2019-06-06 DIAGNOSIS — M19011 Primary osteoarthritis, right shoulder: Secondary | ICD-10-CM | POA: Diagnosis not present

## 2019-06-06 DIAGNOSIS — M25512 Pain in left shoulder: Secondary | ICD-10-CM | POA: Diagnosis not present

## 2019-06-24 DIAGNOSIS — M19011 Primary osteoarthritis, right shoulder: Secondary | ICD-10-CM | POA: Diagnosis not present

## 2019-06-24 DIAGNOSIS — M25512 Pain in left shoulder: Secondary | ICD-10-CM | POA: Diagnosis not present

## 2019-06-24 DIAGNOSIS — M87052 Idiopathic aseptic necrosis of left femur: Secondary | ICD-10-CM | POA: Diagnosis not present

## 2019-06-24 DIAGNOSIS — M87051 Idiopathic aseptic necrosis of right femur: Secondary | ICD-10-CM | POA: Diagnosis not present

## 2019-07-12 DIAGNOSIS — Z79899 Other long term (current) drug therapy: Secondary | ICD-10-CM | POA: Diagnosis not present

## 2019-07-12 DIAGNOSIS — I10 Essential (primary) hypertension: Secondary | ICD-10-CM | POA: Diagnosis not present

## 2019-07-12 DIAGNOSIS — E559 Vitamin D deficiency, unspecified: Secondary | ICD-10-CM | POA: Diagnosis not present

## 2019-07-18 DIAGNOSIS — M19012 Primary osteoarthritis, left shoulder: Secondary | ICD-10-CM | POA: Diagnosis not present

## 2019-07-18 DIAGNOSIS — M19011 Primary osteoarthritis, right shoulder: Secondary | ICD-10-CM | POA: Diagnosis not present

## 2019-07-18 DIAGNOSIS — M160 Bilateral primary osteoarthritis of hip: Secondary | ICD-10-CM | POA: Diagnosis not present

## 2019-08-06 DIAGNOSIS — M160 Bilateral primary osteoarthritis of hip: Secondary | ICD-10-CM | POA: Diagnosis not present

## 2019-08-06 DIAGNOSIS — I10 Essential (primary) hypertension: Secondary | ICD-10-CM | POA: Diagnosis not present

## 2019-08-07 DIAGNOSIS — I10 Essential (primary) hypertension: Secondary | ICD-10-CM | POA: Diagnosis not present

## 2019-08-14 DIAGNOSIS — M19012 Primary osteoarthritis, left shoulder: Secondary | ICD-10-CM | POA: Diagnosis not present

## 2019-08-14 DIAGNOSIS — M19011 Primary osteoarthritis, right shoulder: Secondary | ICD-10-CM | POA: Diagnosis not present

## 2019-08-15 DIAGNOSIS — B351 Tinea unguium: Secondary | ICD-10-CM | POA: Diagnosis not present

## 2019-08-15 DIAGNOSIS — L84 Corns and callosities: Secondary | ICD-10-CM | POA: Diagnosis not present

## 2019-08-23 DIAGNOSIS — Z79899 Other long term (current) drug therapy: Secondary | ICD-10-CM | POA: Diagnosis not present

## 2019-08-23 DIAGNOSIS — N390 Urinary tract infection, site not specified: Secondary | ICD-10-CM | POA: Diagnosis not present

## 2019-09-25 DIAGNOSIS — H2513 Age-related nuclear cataract, bilateral: Secondary | ICD-10-CM | POA: Diagnosis not present

## 2019-09-25 DIAGNOSIS — H25013 Cortical age-related cataract, bilateral: Secondary | ICD-10-CM | POA: Diagnosis not present

## 2019-10-01 DIAGNOSIS — R278 Other lack of coordination: Secondary | ICD-10-CM | POA: Diagnosis not present

## 2019-10-02 DIAGNOSIS — R278 Other lack of coordination: Secondary | ICD-10-CM | POA: Diagnosis not present

## 2019-10-07 DIAGNOSIS — R278 Other lack of coordination: Secondary | ICD-10-CM | POA: Diagnosis not present

## 2019-10-08 DIAGNOSIS — M159 Polyosteoarthritis, unspecified: Secondary | ICD-10-CM | POA: Diagnosis not present

## 2019-10-08 DIAGNOSIS — I10 Essential (primary) hypertension: Secondary | ICD-10-CM | POA: Diagnosis not present

## 2019-10-08 DIAGNOSIS — W19XXXA Unspecified fall, initial encounter: Secondary | ICD-10-CM | POA: Diagnosis not present

## 2019-10-09 DIAGNOSIS — R278 Other lack of coordination: Secondary | ICD-10-CM | POA: Diagnosis not present

## 2019-10-11 DIAGNOSIS — R262 Difficulty in walking, not elsewhere classified: Secondary | ICD-10-CM | POA: Diagnosis not present

## 2019-10-11 DIAGNOSIS — R278 Other lack of coordination: Secondary | ICD-10-CM | POA: Diagnosis not present

## 2019-10-11 DIAGNOSIS — M6281 Muscle weakness (generalized): Secondary | ICD-10-CM | POA: Diagnosis not present

## 2019-10-11 DIAGNOSIS — Z9181 History of falling: Secondary | ICD-10-CM | POA: Diagnosis not present

## 2019-10-14 DIAGNOSIS — R278 Other lack of coordination: Secondary | ICD-10-CM | POA: Diagnosis not present

## 2019-10-16 DIAGNOSIS — Z9181 History of falling: Secondary | ICD-10-CM | POA: Diagnosis not present

## 2019-10-16 DIAGNOSIS — M6281 Muscle weakness (generalized): Secondary | ICD-10-CM | POA: Diagnosis not present

## 2019-10-16 DIAGNOSIS — R278 Other lack of coordination: Secondary | ICD-10-CM | POA: Diagnosis not present

## 2019-10-16 DIAGNOSIS — R262 Difficulty in walking, not elsewhere classified: Secondary | ICD-10-CM | POA: Diagnosis not present

## 2019-10-17 DIAGNOSIS — R262 Difficulty in walking, not elsewhere classified: Secondary | ICD-10-CM | POA: Diagnosis not present

## 2019-10-17 DIAGNOSIS — B351 Tinea unguium: Secondary | ICD-10-CM | POA: Diagnosis not present

## 2019-10-17 DIAGNOSIS — Z9181 History of falling: Secondary | ICD-10-CM | POA: Diagnosis not present

## 2019-10-17 DIAGNOSIS — L84 Corns and callosities: Secondary | ICD-10-CM | POA: Diagnosis not present

## 2019-10-17 DIAGNOSIS — M6281 Muscle weakness (generalized): Secondary | ICD-10-CM | POA: Diagnosis not present

## 2019-10-18 DIAGNOSIS — R278 Other lack of coordination: Secondary | ICD-10-CM | POA: Diagnosis not present

## 2019-10-21 DIAGNOSIS — Z9181 History of falling: Secondary | ICD-10-CM | POA: Diagnosis not present

## 2019-10-21 DIAGNOSIS — R278 Other lack of coordination: Secondary | ICD-10-CM | POA: Diagnosis not present

## 2019-10-21 DIAGNOSIS — M6281 Muscle weakness (generalized): Secondary | ICD-10-CM | POA: Diagnosis not present

## 2019-10-21 DIAGNOSIS — R262 Difficulty in walking, not elsewhere classified: Secondary | ICD-10-CM | POA: Diagnosis not present

## 2019-10-23 DIAGNOSIS — Z9181 History of falling: Secondary | ICD-10-CM | POA: Diagnosis not present

## 2019-10-23 DIAGNOSIS — M6281 Muscle weakness (generalized): Secondary | ICD-10-CM | POA: Diagnosis not present

## 2019-10-23 DIAGNOSIS — R262 Difficulty in walking, not elsewhere classified: Secondary | ICD-10-CM | POA: Diagnosis not present

## 2019-10-23 DIAGNOSIS — R278 Other lack of coordination: Secondary | ICD-10-CM | POA: Diagnosis not present

## 2019-10-24 DIAGNOSIS — Z9181 History of falling: Secondary | ICD-10-CM | POA: Diagnosis not present

## 2019-10-24 DIAGNOSIS — R262 Difficulty in walking, not elsewhere classified: Secondary | ICD-10-CM | POA: Diagnosis not present

## 2019-10-24 DIAGNOSIS — M6281 Muscle weakness (generalized): Secondary | ICD-10-CM | POA: Diagnosis not present

## 2019-10-25 DIAGNOSIS — R278 Other lack of coordination: Secondary | ICD-10-CM | POA: Diagnosis not present

## 2019-10-28 DIAGNOSIS — R278 Other lack of coordination: Secondary | ICD-10-CM | POA: Diagnosis not present

## 2019-10-28 DIAGNOSIS — R262 Difficulty in walking, not elsewhere classified: Secondary | ICD-10-CM | POA: Diagnosis not present

## 2019-10-28 DIAGNOSIS — Z9181 History of falling: Secondary | ICD-10-CM | POA: Diagnosis not present

## 2019-10-28 DIAGNOSIS — M6281 Muscle weakness (generalized): Secondary | ICD-10-CM | POA: Diagnosis not present

## 2019-10-29 DIAGNOSIS — I10 Essential (primary) hypertension: Secondary | ICD-10-CM | POA: Diagnosis not present

## 2019-10-29 DIAGNOSIS — M160 Bilateral primary osteoarthritis of hip: Secondary | ICD-10-CM | POA: Diagnosis not present

## 2019-10-30 DIAGNOSIS — R262 Difficulty in walking, not elsewhere classified: Secondary | ICD-10-CM | POA: Diagnosis not present

## 2019-10-30 DIAGNOSIS — R278 Other lack of coordination: Secondary | ICD-10-CM | POA: Diagnosis not present

## 2019-10-30 DIAGNOSIS — Z9181 History of falling: Secondary | ICD-10-CM | POA: Diagnosis not present

## 2019-10-30 DIAGNOSIS — M6281 Muscle weakness (generalized): Secondary | ICD-10-CM | POA: Diagnosis not present

## 2019-10-31 DIAGNOSIS — M6281 Muscle weakness (generalized): Secondary | ICD-10-CM | POA: Diagnosis not present

## 2019-10-31 DIAGNOSIS — R262 Difficulty in walking, not elsewhere classified: Secondary | ICD-10-CM | POA: Diagnosis not present

## 2019-10-31 DIAGNOSIS — Z9181 History of falling: Secondary | ICD-10-CM | POA: Diagnosis not present

## 2019-11-01 DIAGNOSIS — R278 Other lack of coordination: Secondary | ICD-10-CM | POA: Diagnosis not present

## 2019-11-04 DIAGNOSIS — Z9181 History of falling: Secondary | ICD-10-CM | POA: Diagnosis not present

## 2019-11-04 DIAGNOSIS — R262 Difficulty in walking, not elsewhere classified: Secondary | ICD-10-CM | POA: Diagnosis not present

## 2019-11-04 DIAGNOSIS — R278 Other lack of coordination: Secondary | ICD-10-CM | POA: Diagnosis not present

## 2019-11-04 DIAGNOSIS — M6281 Muscle weakness (generalized): Secondary | ICD-10-CM | POA: Diagnosis not present

## 2019-11-05 DIAGNOSIS — G894 Chronic pain syndrome: Secondary | ICD-10-CM | POA: Diagnosis not present

## 2019-11-05 DIAGNOSIS — I10 Essential (primary) hypertension: Secondary | ICD-10-CM | POA: Diagnosis not present

## 2019-11-07 DIAGNOSIS — R278 Other lack of coordination: Secondary | ICD-10-CM | POA: Diagnosis not present

## 2019-11-08 DIAGNOSIS — R278 Other lack of coordination: Secondary | ICD-10-CM | POA: Diagnosis not present

## 2019-11-11 DIAGNOSIS — M6281 Muscle weakness (generalized): Secondary | ICD-10-CM | POA: Diagnosis not present

## 2019-11-11 DIAGNOSIS — Z9181 History of falling: Secondary | ICD-10-CM | POA: Diagnosis not present

## 2019-11-11 DIAGNOSIS — R262 Difficulty in walking, not elsewhere classified: Secondary | ICD-10-CM | POA: Diagnosis not present

## 2019-11-13 DIAGNOSIS — Z9181 History of falling: Secondary | ICD-10-CM | POA: Diagnosis not present

## 2019-11-13 DIAGNOSIS — R278 Other lack of coordination: Secondary | ICD-10-CM | POA: Diagnosis not present

## 2019-11-13 DIAGNOSIS — M6281 Muscle weakness (generalized): Secondary | ICD-10-CM | POA: Diagnosis not present

## 2019-11-13 DIAGNOSIS — R262 Difficulty in walking, not elsewhere classified: Secondary | ICD-10-CM | POA: Diagnosis not present

## 2019-11-14 DIAGNOSIS — R262 Difficulty in walking, not elsewhere classified: Secondary | ICD-10-CM | POA: Diagnosis not present

## 2019-11-14 DIAGNOSIS — Z9181 History of falling: Secondary | ICD-10-CM | POA: Diagnosis not present

## 2019-11-14 DIAGNOSIS — M6281 Muscle weakness (generalized): Secondary | ICD-10-CM | POA: Diagnosis not present

## 2019-11-15 DIAGNOSIS — R278 Other lack of coordination: Secondary | ICD-10-CM | POA: Diagnosis not present

## 2019-11-17 DIAGNOSIS — R262 Difficulty in walking, not elsewhere classified: Secondary | ICD-10-CM | POA: Diagnosis not present

## 2019-11-17 DIAGNOSIS — Z9181 History of falling: Secondary | ICD-10-CM | POA: Diagnosis not present

## 2019-11-17 DIAGNOSIS — M6281 Muscle weakness (generalized): Secondary | ICD-10-CM | POA: Diagnosis not present

## 2019-11-20 DIAGNOSIS — M6281 Muscle weakness (generalized): Secondary | ICD-10-CM | POA: Diagnosis not present

## 2019-11-20 DIAGNOSIS — Z9181 History of falling: Secondary | ICD-10-CM | POA: Diagnosis not present

## 2019-11-20 DIAGNOSIS — R262 Difficulty in walking, not elsewhere classified: Secondary | ICD-10-CM | POA: Diagnosis not present

## 2019-11-21 DIAGNOSIS — Z9181 History of falling: Secondary | ICD-10-CM | POA: Diagnosis not present

## 2019-11-21 DIAGNOSIS — R262 Difficulty in walking, not elsewhere classified: Secondary | ICD-10-CM | POA: Diagnosis not present

## 2019-11-21 DIAGNOSIS — M6281 Muscle weakness (generalized): Secondary | ICD-10-CM | POA: Diagnosis not present

## 2019-11-23 DIAGNOSIS — R262 Difficulty in walking, not elsewhere classified: Secondary | ICD-10-CM | POA: Diagnosis not present

## 2019-11-23 DIAGNOSIS — Z9181 History of falling: Secondary | ICD-10-CM | POA: Diagnosis not present

## 2019-11-23 DIAGNOSIS — M6281 Muscle weakness (generalized): Secondary | ICD-10-CM | POA: Diagnosis not present

## 2019-11-25 DIAGNOSIS — M6281 Muscle weakness (generalized): Secondary | ICD-10-CM | POA: Diagnosis not present

## 2019-11-25 DIAGNOSIS — R262 Difficulty in walking, not elsewhere classified: Secondary | ICD-10-CM | POA: Diagnosis not present

## 2019-11-25 DIAGNOSIS — Z9181 History of falling: Secondary | ICD-10-CM | POA: Diagnosis not present

## 2019-11-27 DIAGNOSIS — R262 Difficulty in walking, not elsewhere classified: Secondary | ICD-10-CM | POA: Diagnosis not present

## 2019-11-27 DIAGNOSIS — Z9181 History of falling: Secondary | ICD-10-CM | POA: Diagnosis not present

## 2019-11-27 DIAGNOSIS — M6281 Muscle weakness (generalized): Secondary | ICD-10-CM | POA: Diagnosis not present

## 2019-12-02 DIAGNOSIS — Z9181 History of falling: Secondary | ICD-10-CM | POA: Diagnosis not present

## 2019-12-02 DIAGNOSIS — M6281 Muscle weakness (generalized): Secondary | ICD-10-CM | POA: Diagnosis not present

## 2019-12-02 DIAGNOSIS — R262 Difficulty in walking, not elsewhere classified: Secondary | ICD-10-CM | POA: Diagnosis not present

## 2019-12-03 DIAGNOSIS — G894 Chronic pain syndrome: Secondary | ICD-10-CM | POA: Diagnosis not present

## 2019-12-03 DIAGNOSIS — R7303 Prediabetes: Secondary | ICD-10-CM | POA: Diagnosis not present

## 2019-12-04 DIAGNOSIS — Z9181 History of falling: Secondary | ICD-10-CM | POA: Diagnosis not present

## 2019-12-04 DIAGNOSIS — R262 Difficulty in walking, not elsewhere classified: Secondary | ICD-10-CM | POA: Diagnosis not present

## 2019-12-04 DIAGNOSIS — M6281 Muscle weakness (generalized): Secondary | ICD-10-CM | POA: Diagnosis not present

## 2019-12-05 DIAGNOSIS — M6281 Muscle weakness (generalized): Secondary | ICD-10-CM | POA: Diagnosis not present

## 2019-12-05 DIAGNOSIS — R262 Difficulty in walking, not elsewhere classified: Secondary | ICD-10-CM | POA: Diagnosis not present

## 2019-12-05 DIAGNOSIS — Z9181 History of falling: Secondary | ICD-10-CM | POA: Diagnosis not present

## 2019-12-09 DIAGNOSIS — M6281 Muscle weakness (generalized): Secondary | ICD-10-CM | POA: Diagnosis not present

## 2019-12-09 DIAGNOSIS — Z20822 Contact with and (suspected) exposure to covid-19: Secondary | ICD-10-CM | POA: Diagnosis not present

## 2019-12-09 DIAGNOSIS — R262 Difficulty in walking, not elsewhere classified: Secondary | ICD-10-CM | POA: Diagnosis not present

## 2019-12-09 DIAGNOSIS — Z9181 History of falling: Secondary | ICD-10-CM | POA: Diagnosis not present

## 2019-12-11 DIAGNOSIS — M6281 Muscle weakness (generalized): Secondary | ICD-10-CM | POA: Diagnosis not present

## 2019-12-11 DIAGNOSIS — R262 Difficulty in walking, not elsewhere classified: Secondary | ICD-10-CM | POA: Diagnosis not present

## 2019-12-11 DIAGNOSIS — Z9181 History of falling: Secondary | ICD-10-CM | POA: Diagnosis not present

## 2019-12-12 DIAGNOSIS — R262 Difficulty in walking, not elsewhere classified: Secondary | ICD-10-CM | POA: Diagnosis not present

## 2019-12-12 DIAGNOSIS — M6281 Muscle weakness (generalized): Secondary | ICD-10-CM | POA: Diagnosis not present

## 2019-12-12 DIAGNOSIS — Z9181 History of falling: Secondary | ICD-10-CM | POA: Diagnosis not present

## 2019-12-20 DIAGNOSIS — M19012 Primary osteoarthritis, left shoulder: Secondary | ICD-10-CM | POA: Diagnosis not present

## 2019-12-20 DIAGNOSIS — M19011 Primary osteoarthritis, right shoulder: Secondary | ICD-10-CM | POA: Diagnosis not present

## 2019-12-20 DIAGNOSIS — M87051 Idiopathic aseptic necrosis of right femur: Secondary | ICD-10-CM | POA: Diagnosis not present

## 2019-12-20 DIAGNOSIS — M87052 Idiopathic aseptic necrosis of left femur: Secondary | ICD-10-CM | POA: Diagnosis not present

## 2019-12-25 DIAGNOSIS — Z9183 Wandering in diseases classified elsewhere: Secondary | ICD-10-CM | POA: Diagnosis not present

## 2019-12-25 DIAGNOSIS — I10 Essential (primary) hypertension: Secondary | ICD-10-CM | POA: Diagnosis not present

## 2020-01-01 DIAGNOSIS — I10 Essential (primary) hypertension: Secondary | ICD-10-CM | POA: Diagnosis not present

## 2020-01-01 DIAGNOSIS — L853 Xerosis cutis: Secondary | ICD-10-CM | POA: Diagnosis not present

## 2020-01-09 DIAGNOSIS — L84 Corns and callosities: Secondary | ICD-10-CM | POA: Diagnosis not present

## 2020-01-09 DIAGNOSIS — B351 Tinea unguium: Secondary | ICD-10-CM | POA: Diagnosis not present

## 2020-01-10 DIAGNOSIS — I10 Essential (primary) hypertension: Secondary | ICD-10-CM | POA: Diagnosis not present

## 2020-01-14 DIAGNOSIS — R21 Rash and other nonspecific skin eruption: Secondary | ICD-10-CM | POA: Diagnosis not present

## 2020-03-03 DIAGNOSIS — M159 Polyosteoarthritis, unspecified: Secondary | ICD-10-CM | POA: Diagnosis not present

## 2020-03-03 DIAGNOSIS — L309 Dermatitis, unspecified: Secondary | ICD-10-CM | POA: Diagnosis not present

## 2020-03-03 DIAGNOSIS — I10 Essential (primary) hypertension: Secondary | ICD-10-CM | POA: Diagnosis not present

## 2022-09-07 DEATH — deceased
